# Patient Record
Sex: Female | Born: 1940 | Race: White | Hispanic: No | Marital: Married | Smoking: Never smoker
Health system: Southern US, Community
[De-identification: ages and names within clinical notes are randomized; demographics above are authoritative.]

## PROBLEM LIST (undated history)

## (undated) DIAGNOSIS — M1990 Unspecified osteoarthritis, unspecified site: Secondary | ICD-10-CM

## (undated) DIAGNOSIS — K219 Gastro-esophageal reflux disease without esophagitis: Secondary | ICD-10-CM

## (undated) DIAGNOSIS — Z9221 Personal history of antineoplastic chemotherapy: Secondary | ICD-10-CM

## (undated) DIAGNOSIS — C801 Malignant (primary) neoplasm, unspecified: Secondary | ICD-10-CM

## (undated) DIAGNOSIS — G629 Polyneuropathy, unspecified: Secondary | ICD-10-CM

## (undated) DIAGNOSIS — I10 Essential (primary) hypertension: Secondary | ICD-10-CM

## (undated) DIAGNOSIS — E079 Disorder of thyroid, unspecified: Secondary | ICD-10-CM

## (undated) DIAGNOSIS — Z923 Personal history of irradiation: Secondary | ICD-10-CM

## (undated) DIAGNOSIS — I890 Lymphedema, not elsewhere classified: Secondary | ICD-10-CM

## (undated) DIAGNOSIS — C50919 Malignant neoplasm of unspecified site of unspecified female breast: Secondary | ICD-10-CM

## (undated) HISTORY — PX: APPENDECTOMY: SHX54

## (undated) HISTORY — PX: VENTRAL HERNIA REPAIR: SHX424

## (undated) HISTORY — DX: Essential (primary) hypertension: I10

## (undated) HISTORY — PX: HEMORROIDECTOMY: SUR656

## (undated) HISTORY — DX: Disorder of thyroid, unspecified: E07.9

## (undated) HISTORY — PX: COLONOSCOPY: SHX174

## (undated) HISTORY — PX: FOOT SURGERY: SHX648

---
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
## 1963-12-14 HISTORY — PX: OTHER SURGICAL HISTORY: SHX169

## 1968-12-13 HISTORY — PX: OTHER SURGICAL HISTORY: SHX169

## 1968-12-13 HISTORY — PX: SEPTOPLASTY: SUR1290

## 1972-12-13 HISTORY — PX: VAGINAL HYSTERECTOMY: SUR661

## 1981-12-13 HISTORY — PX: BACK SURGERY: SHX140

## 1998-12-13 DIAGNOSIS — Z923 Personal history of irradiation: Secondary | ICD-10-CM

## 1998-12-13 DIAGNOSIS — C50919 Malignant neoplasm of unspecified site of unspecified female breast: Secondary | ICD-10-CM

## 1998-12-13 DIAGNOSIS — Z9221 Personal history of antineoplastic chemotherapy: Secondary | ICD-10-CM

## 1998-12-13 HISTORY — PX: MASTECTOMY: SHX3

## 1998-12-13 HISTORY — DX: Personal history of irradiation: Z92.3

## 1998-12-13 HISTORY — DX: Malignant neoplasm of unspecified site of unspecified female breast: C50.919

## 1998-12-13 HISTORY — DX: Personal history of antineoplastic chemotherapy: Z92.21

## 2000-03-13 LAB — FECAL OCCULT BLOOD, GUAIAC: Fecal Occult Blood: NEGATIVE

## 2001-02-10 ENCOUNTER — Encounter: Payer: Self-pay | Admitting: Family Medicine

## 2001-02-10 LAB — CONVERTED CEMR LAB: Hgb A1c MFr Bld: 6.3 %

## 2001-05-13 ENCOUNTER — Encounter: Payer: Self-pay | Admitting: Family Medicine

## 2001-05-13 LAB — CONVERTED CEMR LAB: Hgb A1c MFr Bld: 6.4 %

## 2001-06-06 ENCOUNTER — Other Ambulatory Visit: Admission: RE | Admit: 2001-06-06 | Discharge: 2001-06-06 | Payer: Self-pay | Admitting: Family Medicine

## 2001-07-07 ENCOUNTER — Ambulatory Visit (HOSPITAL_COMMUNITY): Admission: RE | Admit: 2001-07-07 | Discharge: 2001-07-07 | Payer: Self-pay | Admitting: Internal Medicine

## 2001-07-07 ENCOUNTER — Encounter: Payer: Self-pay | Admitting: Internal Medicine

## 2001-11-12 ENCOUNTER — Encounter: Payer: Self-pay | Admitting: Family Medicine

## 2001-11-12 LAB — CONVERTED CEMR LAB: Hgb A1c MFr Bld: 6.1 %

## 2002-04-03 HISTORY — PX: OTHER SURGICAL HISTORY: SHX169

## 2002-05-13 ENCOUNTER — Encounter: Payer: Self-pay | Admitting: Family Medicine

## 2002-05-13 LAB — CONVERTED CEMR LAB: Hgb A1c MFr Bld: 6.6 %

## 2002-08-13 ENCOUNTER — Encounter: Payer: Self-pay | Admitting: Family Medicine

## 2003-06-13 ENCOUNTER — Encounter: Payer: Self-pay | Admitting: Family Medicine

## 2003-09-13 ENCOUNTER — Encounter: Payer: Self-pay | Admitting: Family Medicine

## 2004-03-12 HISTORY — PX: OTHER SURGICAL HISTORY: SHX169

## 2004-03-13 ENCOUNTER — Encounter: Payer: Self-pay | Admitting: Family Medicine

## 2004-08-13 ENCOUNTER — Encounter: Payer: Self-pay | Admitting: Family Medicine

## 2004-08-13 LAB — CONVERTED CEMR LAB
Hgb A1c MFr Bld: 7.4 %
Pap Smear: NORMAL

## 2004-08-20 ENCOUNTER — Other Ambulatory Visit: Admission: RE | Admit: 2004-08-20 | Discharge: 2004-08-20 | Payer: Self-pay | Admitting: Family Medicine

## 2004-10-23 ENCOUNTER — Ambulatory Visit: Payer: Self-pay | Admitting: Family Medicine

## 2004-11-12 ENCOUNTER — Encounter: Payer: Self-pay | Admitting: Family Medicine

## 2004-11-17 ENCOUNTER — Ambulatory Visit: Payer: Self-pay | Admitting: Family Medicine

## 2004-11-19 ENCOUNTER — Ambulatory Visit: Payer: Self-pay | Admitting: Family Medicine

## 2005-05-13 ENCOUNTER — Encounter: Payer: Self-pay | Admitting: Family Medicine

## 2005-05-18 ENCOUNTER — Ambulatory Visit: Payer: Self-pay | Admitting: Family Medicine

## 2005-05-27 ENCOUNTER — Ambulatory Visit: Payer: Self-pay | Admitting: Family Medicine

## 2005-08-12 ENCOUNTER — Ambulatory Visit: Payer: Self-pay | Admitting: Internal Medicine

## 2005-08-12 HISTORY — PX: OTHER SURGICAL HISTORY: SHX169

## 2005-09-30 ENCOUNTER — Ambulatory Visit: Payer: Self-pay | Admitting: Family Medicine

## 2005-10-29 ENCOUNTER — Ambulatory Visit: Payer: Self-pay | Admitting: Family Medicine

## 2005-11-12 ENCOUNTER — Encounter: Payer: Self-pay | Admitting: Family Medicine

## 2005-11-12 LAB — CONVERTED CEMR LAB: Hgb A1c MFr Bld: 6.3 %

## 2005-11-23 ENCOUNTER — Ambulatory Visit: Payer: Self-pay | Admitting: Family Medicine

## 2005-11-23 LAB — CONVERTED CEMR LAB: Microalbumin U total vol: 3.7 mg/L

## 2005-11-25 ENCOUNTER — Ambulatory Visit: Payer: Self-pay | Admitting: Family Medicine

## 2005-12-02 ENCOUNTER — Encounter: Payer: Self-pay | Admitting: Family Medicine

## 2005-12-13 ENCOUNTER — Encounter: Payer: Self-pay | Admitting: Family Medicine

## 2006-01-13 ENCOUNTER — Encounter: Payer: Self-pay | Admitting: Family Medicine

## 2006-01-27 ENCOUNTER — Ambulatory Visit: Payer: Self-pay | Admitting: Family Medicine

## 2006-02-11 ENCOUNTER — Ambulatory Visit: Payer: Self-pay | Admitting: Family Medicine

## 2006-02-24 ENCOUNTER — Ambulatory Visit: Payer: Self-pay

## 2006-02-24 ENCOUNTER — Encounter: Payer: Self-pay | Admitting: Cardiology

## 2006-02-24 HISTORY — PX: OTHER SURGICAL HISTORY: SHX169

## 2006-02-24 HISTORY — PX: DOPPLER ECHOCARDIOGRAPHY: SHX263

## 2006-05-13 ENCOUNTER — Encounter: Payer: Self-pay | Admitting: Family Medicine

## 2006-05-13 LAB — CONVERTED CEMR LAB: Hgb A1c MFr Bld: 7.2 %

## 2006-05-24 ENCOUNTER — Ambulatory Visit: Payer: Self-pay | Admitting: Family Medicine

## 2006-05-26 ENCOUNTER — Ambulatory Visit: Payer: Self-pay | Admitting: Family Medicine

## 2006-06-12 ENCOUNTER — Encounter: Payer: Self-pay | Admitting: Family Medicine

## 2006-06-12 LAB — CONVERTED CEMR LAB: Hgb A1c MFr Bld: 6.7 %

## 2006-06-27 ENCOUNTER — Ambulatory Visit: Payer: Self-pay | Admitting: Family Medicine

## 2006-10-31 ENCOUNTER — Ambulatory Visit: Payer: Self-pay | Admitting: Family Medicine

## 2006-11-28 ENCOUNTER — Ambulatory Visit: Payer: Self-pay | Admitting: Family Medicine

## 2007-01-02 ENCOUNTER — Ambulatory Visit: Payer: Self-pay | Admitting: Family Medicine

## 2007-03-30 ENCOUNTER — Ambulatory Visit: Payer: Self-pay | Admitting: Family Medicine

## 2007-03-30 LAB — CONVERTED CEMR LAB
Hgb A1c MFr Bld: 7.9 %
Hgb A1c MFr Bld: 7.9 % — ABNORMAL HIGH (ref 4.6–6.0)

## 2007-04-03 ENCOUNTER — Ambulatory Visit: Payer: Self-pay | Admitting: Family Medicine

## 2007-04-26 ENCOUNTER — Encounter: Payer: Self-pay | Admitting: Family Medicine

## 2007-04-26 DIAGNOSIS — C50919 Malignant neoplasm of unspecified site of unspecified female breast: Secondary | ICD-10-CM | POA: Insufficient documentation

## 2007-04-26 DIAGNOSIS — E118 Type 2 diabetes mellitus with unspecified complications: Secondary | ICD-10-CM | POA: Insufficient documentation

## 2007-04-26 DIAGNOSIS — I10 Essential (primary) hypertension: Secondary | ICD-10-CM

## 2007-04-26 DIAGNOSIS — E119 Type 2 diabetes mellitus without complications: Secondary | ICD-10-CM | POA: Insufficient documentation

## 2007-04-26 DIAGNOSIS — E039 Hypothyroidism, unspecified: Secondary | ICD-10-CM | POA: Insufficient documentation

## 2007-05-01 ENCOUNTER — Ambulatory Visit: Payer: Self-pay | Admitting: Family Medicine

## 2007-07-26 ENCOUNTER — Ambulatory Visit: Payer: Self-pay | Admitting: Family Medicine

## 2007-07-30 LAB — CONVERTED CEMR LAB
Basophils Absolute: 0.1 10*3/uL (ref 0.0–0.1)
CO2: 31 meq/L (ref 19–32)
Chloride: 102 meq/L (ref 96–112)
Cholesterol: 161 mg/dL (ref 0–200)
Eosinophils Absolute: 0.2 10*3/uL (ref 0.0–0.6)
Eosinophils Relative: 3.6 % (ref 0.0–5.0)
Free T4: 0.7 ng/dL (ref 0.6–1.6)
GFR calc Af Amer: 107 mL/min
Glucose, Bld: 91 mg/dL (ref 70–99)
HCT: 35.9 % — ABNORMAL LOW (ref 36.0–46.0)
Hemoglobin: 12.4 g/dL (ref 12.0–15.0)
MCHC: 34.4 g/dL (ref 30.0–36.0)
MCV: 84.9 fL (ref 78.0–100.0)
Microalb Creat Ratio: 3.2 mg/g (ref 0.0–30.0)
Monocytes Absolute: 0.6 10*3/uL (ref 0.2–0.7)
Neutrophils Relative %: 59.4 % (ref 43.0–77.0)
Sodium: 141 meq/L (ref 135–145)
TSH: 3.26 microintl units/mL (ref 0.35–5.50)

## 2007-08-01 ENCOUNTER — Ambulatory Visit: Payer: Self-pay | Admitting: Family Medicine

## 2007-09-26 ENCOUNTER — Ambulatory Visit: Payer: Self-pay | Admitting: Oncology

## 2007-10-10 ENCOUNTER — Encounter: Payer: Self-pay | Admitting: Family Medicine

## 2007-10-10 LAB — CBC WITH DIFFERENTIAL (CANCER CENTER ONLY)
BASO%: 0.6 % (ref 0.0–2.0)
EOS%: 2.8 % (ref 0.0–7.0)
LYMPH%: 26.2 % (ref 14.0–48.0)
MCH: 28.1 pg (ref 26.0–34.0)
MCV: 84 fL (ref 81–101)
MONO%: 6.1 % (ref 0.0–13.0)
NEUT#: 5.5 10*3/uL (ref 1.5–6.5)
Platelets: 377 10*3/uL (ref 145–400)
RDW: 12.6 % (ref 10.5–14.6)

## 2007-10-10 LAB — COMPREHENSIVE METABOLIC PANEL
ALT: 23 U/L (ref 0–35)
AST: 27 U/L (ref 0–37)
CO2: 27 mEq/L (ref 19–32)
Calcium: 9.5 mg/dL (ref 8.4–10.5)
Chloride: 97 mEq/L (ref 96–112)
Creatinine, Ser: 0.76 mg/dL (ref 0.40–1.20)
Potassium: 4 mEq/L (ref 3.5–5.3)
Sodium: 136 mEq/L (ref 135–145)
Total Protein: 6.6 g/dL (ref 6.0–8.3)

## 2007-10-12 ENCOUNTER — Encounter: Admission: RE | Admit: 2007-10-12 | Discharge: 2007-10-12 | Payer: Self-pay | Admitting: Oncology

## 2007-10-13 ENCOUNTER — Ambulatory Visit: Payer: Self-pay | Admitting: Family Medicine

## 2007-10-25 ENCOUNTER — Ambulatory Visit: Payer: Self-pay | Admitting: Internal Medicine

## 2007-10-25 ENCOUNTER — Encounter: Payer: Self-pay | Admitting: Family Medicine

## 2007-10-25 HISTORY — PX: OTHER SURGICAL HISTORY: SHX169

## 2007-11-27 ENCOUNTER — Telehealth: Payer: Self-pay | Admitting: Family Medicine

## 2007-11-28 ENCOUNTER — Ambulatory Visit: Payer: Self-pay | Admitting: Family Medicine

## 2008-01-25 ENCOUNTER — Ambulatory Visit: Payer: Self-pay | Admitting: Family Medicine

## 2008-01-25 LAB — CONVERTED CEMR LAB: Hgb A1c MFr Bld: 7.7 % — ABNORMAL HIGH (ref 4.6–6.0)

## 2008-01-29 ENCOUNTER — Ambulatory Visit: Payer: Self-pay | Admitting: Family Medicine

## 2008-07-30 ENCOUNTER — Ambulatory Visit: Payer: Self-pay | Admitting: Family Medicine

## 2008-07-30 LAB — CONVERTED CEMR LAB
Albumin: 3.5 g/dL (ref 3.5–5.2)
BUN: 12 mg/dL (ref 6–23)
Basophils Relative: 0.6 % (ref 0.0–3.0)
Calcium: 9.3 mg/dL (ref 8.4–10.5)
Creatinine, Ser: 0.7 mg/dL (ref 0.4–1.2)
Creatinine,U: 41.2 mg/dL
Eosinophils Absolute: 0.3 10*3/uL (ref 0.0–0.7)
Eosinophils Relative: 3.9 % (ref 0.0–5.0)
GFR calc Af Amer: 107 mL/min
GFR calc non Af Amer: 88 mL/min
Glucose, Bld: 114 mg/dL — ABNORMAL HIGH (ref 70–99)
HCT: 38.3 % (ref 36.0–46.0)
HDL: 32.8 mg/dL — ABNORMAL LOW (ref >39.0)
Hemoglobin: 13 g/dL (ref 12.0–15.0)
Hgb A1c MFr Bld: 7.9 % — ABNORMAL HIGH (ref 4.6–6.0)
MCV: 85.1 fL (ref 78.0–100.0)
Microalb Creat Ratio: 4.9 mg/g (ref 0.0–30.0)
Monocytes Absolute: 0.6 10*3/uL (ref 0.1–1.0)
Monocytes Relative: 8 % (ref 3.0–12.0)
Neutro Abs: 4.6 10*3/uL (ref 1.4–7.7)
Platelets: 370 10*3/uL (ref 150–400)
RBC: 4.5 M/uL (ref 3.87–5.11)
Total CHOL/HDL Ratio: 5.1
Total Protein: 6.6 g/dL (ref 6.0–8.3)
WBC: 7.4 10*3/uL (ref 4.5–10.5)

## 2008-08-01 ENCOUNTER — Ambulatory Visit: Payer: Self-pay | Admitting: Family Medicine

## 2008-09-17 ENCOUNTER — Ambulatory Visit: Payer: Self-pay | Admitting: Family Medicine

## 2008-10-08 ENCOUNTER — Ambulatory Visit: Payer: Self-pay | Admitting: Oncology

## 2008-10-14 ENCOUNTER — Encounter: Admission: RE | Admit: 2008-10-14 | Discharge: 2008-10-14 | Payer: Self-pay | Admitting: Family Medicine

## 2008-10-17 ENCOUNTER — Encounter (INDEPENDENT_AMBULATORY_CARE_PROVIDER_SITE_OTHER): Payer: Self-pay | Admitting: *Deleted

## 2008-11-28 ENCOUNTER — Ambulatory Visit: Payer: Self-pay | Admitting: Oncology

## 2008-12-02 ENCOUNTER — Encounter: Payer: Self-pay | Admitting: Family Medicine

## 2008-12-02 LAB — CMP (CANCER CENTER ONLY)
ALT(SGPT): 28 U/L (ref 10–47)
AST: 35 U/L (ref 11–38)
Alkaline Phosphatase: 41 U/L (ref 26–84)
CO2: 27 mEq/L (ref 18–33)
Creat: 0.8 mg/dl (ref 0.6–1.2)
Sodium: 134 mEq/L (ref 128–145)
Total Bilirubin: 0.6 mg/dl (ref 0.20–1.60)
Total Protein: 7.3 g/dL (ref 6.4–8.1)

## 2008-12-02 LAB — CBC WITH DIFFERENTIAL (CANCER CENTER ONLY)
BASO#: 0.1 10*3/uL (ref 0.0–0.2)
EOS%: 2.6 % (ref 0.0–7.0)
Eosinophils Absolute: 0.2 10*3/uL (ref 0.0–0.5)
HGB: 13.1 g/dL (ref 11.6–15.9)
LYMPH%: 18.9 % (ref 14.0–48.0)
MCH: 27.5 pg (ref 26.0–34.0)
MCHC: 33.1 g/dL (ref 32.0–36.0)
MCV: 83 fL (ref 81–101)
MONO%: 6.7 % (ref 0.0–13.0)
Platelets: 330 10*3/uL (ref 145–400)
RBC: 4.77 10*6/uL (ref 3.70–5.32)

## 2008-12-27 ENCOUNTER — Ambulatory Visit: Payer: Self-pay | Admitting: Family Medicine

## 2009-01-21 ENCOUNTER — Ambulatory Visit: Payer: Self-pay | Admitting: Family Medicine

## 2009-01-22 LAB — CONVERTED CEMR LAB: Vit D, 1,25-Dihydroxy: 42 (ref 30–89)

## 2009-01-23 ENCOUNTER — Ambulatory Visit: Payer: Self-pay | Admitting: Family Medicine

## 2009-01-23 DIAGNOSIS — M25519 Pain in unspecified shoulder: Secondary | ICD-10-CM

## 2009-01-27 ENCOUNTER — Ambulatory Visit: Payer: Self-pay | Admitting: Family Medicine

## 2009-04-29 ENCOUNTER — Ambulatory Visit: Payer: Self-pay | Admitting: Family Medicine

## 2009-04-29 DIAGNOSIS — T7840XA Allergy, unspecified, initial encounter: Secondary | ICD-10-CM | POA: Insufficient documentation

## 2009-05-05 ENCOUNTER — Ambulatory Visit: Payer: Self-pay | Admitting: Family Medicine

## 2009-05-19 ENCOUNTER — Telehealth (INDEPENDENT_AMBULATORY_CARE_PROVIDER_SITE_OTHER): Payer: Self-pay | Admitting: *Deleted

## 2009-07-31 ENCOUNTER — Ambulatory Visit: Payer: Self-pay | Admitting: Family Medicine

## 2009-07-31 LAB — CONVERTED CEMR LAB
ALT: 20 units/L (ref 0–35)
AST: 26 units/L (ref 0–37)
Alkaline Phosphatase: 45 units/L (ref 39–117)
BUN: 14 mg/dL (ref 6–23)
Basophils Absolute: 0 10*3/uL (ref 0.0–0.1)
Basophils Relative: 0.3 % (ref 0.0–3.0)
Bilirubin, Direct: 0 mg/dL (ref 0.0–0.3)
Calcium: 9.4 mg/dL (ref 8.4–10.5)
Cholesterol: 176 mg/dL (ref 0–200)
Eosinophils Absolute: 0.2 10*3/uL (ref 0.0–0.7)
Free T4: 0.9 ng/dL (ref 0.6–1.6)
GFR calc non Af Amer: 87.87 mL/min (ref >60)
Glucose, Bld: 112 mg/dL — ABNORMAL HIGH (ref 70–99)
Hgb A1c MFr Bld: 7.9 % — ABNORMAL HIGH (ref 4.6–6.5)
Lymphocytes Relative: 20.9 % (ref 12.0–46.0)
MCHC: 33.9 g/dL (ref 30.0–36.0)
MCV: 85.9 fL (ref 78.0–100.0)
Microalb, Ur: 0.4 mg/dL (ref 0.0–1.9)
Monocytes Absolute: 0.7 10*3/uL (ref 0.1–1.0)
Neutrophils Relative %: 67.8 % (ref 43.0–77.0)
Platelets: 316 10*3/uL (ref 150.0–400.0)
Potassium: 3.9 meq/L (ref 3.5–5.1)
RDW: 13 % (ref 11.5–14.6)
Sodium: 136 meq/L (ref 135–145)
Total Bilirubin: 0.9 mg/dL (ref 0.3–1.2)
Total Protein: 7 g/dL (ref 6.0–8.3)

## 2009-08-06 ENCOUNTER — Encounter: Payer: Self-pay | Admitting: Family Medicine

## 2009-08-06 ENCOUNTER — Other Ambulatory Visit: Admission: RE | Admit: 2009-08-06 | Discharge: 2009-08-06 | Payer: Self-pay | Admitting: Family Medicine

## 2009-08-06 ENCOUNTER — Ambulatory Visit: Payer: Self-pay | Admitting: Family Medicine

## 2009-08-06 LAB — CONVERTED CEMR LAB: Pap Smear: NORMAL

## 2009-08-12 ENCOUNTER — Encounter (INDEPENDENT_AMBULATORY_CARE_PROVIDER_SITE_OTHER): Payer: Self-pay | Admitting: *Deleted

## 2009-10-13 ENCOUNTER — Ambulatory Visit: Payer: Self-pay | Admitting: Family Medicine

## 2009-10-24 ENCOUNTER — Telehealth: Payer: Self-pay | Admitting: Family Medicine

## 2009-11-03 ENCOUNTER — Encounter: Payer: Self-pay | Admitting: Family Medicine

## 2009-11-03 ENCOUNTER — Ambulatory Visit: Payer: Self-pay | Admitting: Family Medicine

## 2009-11-03 LAB — HM MAMMOGRAPHY: HM Mammogram: NORMAL

## 2009-11-10 ENCOUNTER — Encounter (INDEPENDENT_AMBULATORY_CARE_PROVIDER_SITE_OTHER): Payer: Self-pay | Admitting: *Deleted

## 2009-11-12 ENCOUNTER — Telehealth: Payer: Self-pay | Admitting: Family Medicine

## 2009-11-13 ENCOUNTER — Encounter: Payer: Self-pay | Admitting: Family Medicine

## 2009-12-13 ENCOUNTER — Ambulatory Visit: Payer: Self-pay | Admitting: Internal Medicine

## 2010-01-01 ENCOUNTER — Ambulatory Visit: Payer: Self-pay | Admitting: Internal Medicine

## 2010-01-01 ENCOUNTER — Encounter: Payer: Self-pay | Admitting: Family Medicine

## 2010-01-13 ENCOUNTER — Ambulatory Visit: Payer: Self-pay | Admitting: Internal Medicine

## 2010-01-21 ENCOUNTER — Encounter: Payer: Self-pay | Admitting: Family Medicine

## 2010-01-21 ENCOUNTER — Ambulatory Visit: Payer: Self-pay | Admitting: Internal Medicine

## 2010-01-21 LAB — HM DEXA SCAN

## 2010-02-05 ENCOUNTER — Ambulatory Visit: Payer: Self-pay | Admitting: Family Medicine

## 2010-02-10 ENCOUNTER — Ambulatory Visit: Payer: Self-pay | Admitting: Family Medicine

## 2010-02-10 DIAGNOSIS — K449 Diaphragmatic hernia without obstruction or gangrene: Secondary | ICD-10-CM | POA: Insufficient documentation

## 2010-02-10 LAB — HM DIABETES FOOT EXAM

## 2010-03-13 ENCOUNTER — Encounter: Payer: Self-pay | Admitting: Family Medicine

## 2010-03-25 ENCOUNTER — Encounter: Payer: Self-pay | Admitting: Family Medicine

## 2010-04-22 ENCOUNTER — Encounter: Payer: Self-pay | Admitting: Family Medicine

## 2010-04-22 ENCOUNTER — Ambulatory Visit: Payer: Self-pay | Admitting: Unknown Physician Specialty

## 2010-04-22 LAB — HM COLONOSCOPY

## 2010-06-09 ENCOUNTER — Telehealth: Payer: Self-pay | Admitting: Family Medicine

## 2010-06-26 ENCOUNTER — Encounter: Payer: Self-pay | Admitting: Family Medicine

## 2010-08-05 ENCOUNTER — Telehealth (INDEPENDENT_AMBULATORY_CARE_PROVIDER_SITE_OTHER): Payer: Self-pay | Admitting: *Deleted

## 2010-08-06 ENCOUNTER — Ambulatory Visit: Payer: Self-pay | Admitting: Family Medicine

## 2010-08-07 LAB — CONVERTED CEMR LAB
ALT: 22 units/L (ref 0–35)
AST: 25 units/L (ref 0–37)
Albumin: 3.5 g/dL (ref 3.5–5.2)
Alkaline Phosphatase: 46 units/L (ref 39–117)
Chloride: 99 meq/L (ref 96–112)
GFR calc non Af Amer: 89.08 mL/min (ref >60)
Glucose, Bld: 139 mg/dL — ABNORMAL HIGH (ref 70–99)
Hgb A1c MFr Bld: 8 % — ABNORMAL HIGH (ref 4.6–6.5)
LDL Cholesterol: 105 mg/dL — ABNORMAL HIGH (ref 0–99)
Potassium: 4.4 meq/L (ref 3.5–5.1)
Sodium: 136 meq/L (ref 135–145)
VLDL: 18.4 mg/dL (ref 0.0–40.0)

## 2010-08-10 ENCOUNTER — Ambulatory Visit: Payer: Self-pay | Admitting: Family Medicine

## 2010-10-27 DIAGNOSIS — C4491 Basal cell carcinoma of skin, unspecified: Secondary | ICD-10-CM

## 2010-10-27 HISTORY — DX: Basal cell carcinoma of skin, unspecified: C44.91

## 2010-11-03 ENCOUNTER — Ambulatory Visit: Payer: Self-pay | Admitting: Family Medicine

## 2010-11-16 ENCOUNTER — Ambulatory Visit: Payer: Self-pay | Admitting: Internal Medicine

## 2010-11-19 ENCOUNTER — Ambulatory Visit: Payer: Self-pay | Admitting: Internal Medicine

## 2010-11-24 ENCOUNTER — Ambulatory Visit: Payer: Self-pay | Admitting: Family Medicine

## 2010-11-24 DIAGNOSIS — M79609 Pain in unspecified limb: Secondary | ICD-10-CM

## 2010-12-04 ENCOUNTER — Ambulatory Visit: Payer: Self-pay | Admitting: Surgery

## 2010-12-11 ENCOUNTER — Ambulatory Visit: Payer: Self-pay | Admitting: Surgery

## 2010-12-11 HISTORY — PX: BREAST EXCISIONAL BIOPSY: SUR124

## 2010-12-15 LAB — PATHOLOGY REPORT

## 2010-12-17 ENCOUNTER — Encounter (INDEPENDENT_AMBULATORY_CARE_PROVIDER_SITE_OTHER): Payer: Self-pay | Admitting: *Deleted

## 2011-01-05 ENCOUNTER — Ambulatory Visit: Payer: Self-pay | Admitting: Internal Medicine

## 2011-01-05 ENCOUNTER — Encounter: Payer: Self-pay | Admitting: Family Medicine

## 2011-01-06 LAB — CANCER ANTIGEN 27.29: CA 27.29: 33.6 U/mL (ref 0.0–38.6)

## 2011-01-10 LAB — CONVERTED CEMR LAB
Eosinophils Absolute: 0.3 10*3/uL (ref 0.0–0.7)
Eosinophils Relative: 3.7 % (ref 0.0–5.0)
HCT: 39.1 % (ref 36.0–46.0)
Hemoglobin: 13 g/dL (ref 12.0–15.0)
MCV: 86.2 fL (ref 78.0–100.0)
Monocytes Absolute: 0.6 10*3/uL (ref 0.1–1.0)
Monocytes Relative: 7.4 % (ref 3.0–12.0)
Neutro Abs: 5.2 10*3/uL (ref 1.4–7.7)
Platelets: 359 10*3/uL (ref 150–400)
RDW: 13.1 % (ref 11.5–14.6)

## 2011-01-13 ENCOUNTER — Ambulatory Visit: Payer: Self-pay | Admitting: Internal Medicine

## 2011-01-14 ENCOUNTER — Encounter: Payer: Self-pay | Admitting: Family Medicine

## 2011-01-14 NOTE — Letter (Signed)
Summary: Dr.Ybanez,[facility],Note   Imported By: Beau Fanny 03/24/2010 09:57:34  _____________________________________________________________________  External Attachment:    Type:   Image     Comment:   External Document

## 2011-01-14 NOTE — Progress Notes (Signed)
Summary: Lantus Solostar       New/Updated Medications: LANTUS SOLOSTAR 100 UNIT/ML SOLN (INSULIN GLARGINE) 36 units per day or as directed. Prescriptions: LANTUS SOLOSTAR 100 UNIT/ML SOLN (INSULIN GLARGINE) 36 units per day or as directed.  #5 x 12   Entered by:   Delilah Shan CMA (AAMA)   Authorized by:   Shaune Leeks MD   Signed by:   Delilah Shan CMA (AAMA) on 06/09/2010   Method used:   Electronically to        [facility] (retail)       [address]       [place], [place]  [id number]       Ph: [phone]       Fax: [phone]   RxID:   [id number]

## 2011-01-14 NOTE — Procedures (Signed)
Summary: Colonoscopy by Dr.Marli Diego [facility]  Colonoscopy by Dr.Marcanthony [facility]   Imported By: Beau Fanny 04/27/2010 16:34:03  _____________________________________________________________________  External Attachment:    Type:   Image     Comment:   External Document

## 2011-01-14 NOTE — Assessment & Plan Note (Signed)
Summary: F/U AFTER LABS / LFW   Vital Signs:  Patient profile:   70 year old female Weight:      212.75 pounds Temp:     98.4 degrees F oral Pulse rate:   88 / minute Pulse rhythm:   regular BP sitting:   126 / 70  (left arm) Cuff size:   large  Vitals Entered By: Sydell Axon LPN (February 10, 1609 9:57 AM) CC: 6 Month follow-up after labs   History of Present Illness: Pt here for followup. She is doing well with no real complaints.  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 Take 2 Tablet By Mouth Twice A Day 2)  Zetia 10 Mg Tabs (Ezetimibe) .... Take 1 Tablet By Mouth Once A Day 3)  Vitamin C 500 Mg Tabs (Ascorbic Acid) .... Take One By Mouth Twice A Day 4)  Tylenol Pm Extra Strength 500-25 Mg Tabs (Diphenhydramine-Apap (Sleep)) .... Take 1/2 By Mouth At Bedtime By Mouth As Needed 5)  Glucophage 500 Mg Tabs (Metformin Hcl) .... Take One By Mouth Mid-Day Daily 6)  Aspirin 81 Mg Tabs (Aspirin) .... Take One By Mouth Daily 7)  Citracal With Mag. 8)  Dramamine 50 Mg Tabs (Dimenhydrinate) .Marland Kitchen.. 1 By Mouth As Needed 9)  Mucinex 600 Mg Tb12 (Guaifenesin) .... As Needed 10)  Lantus Solostar 100 Unit/ml Soln (Insulin Glargine) .... 26 Units Betwwen 6&7pmqd 11)  Centrum Silver   Tabs (Multiple Vitamins-Minerals) .Marland Kitchen.. 1 Daily 12)  Diovan Hct 160-25 Mg Tabs (Valsartan-Hydrochlorothiazide) .Marland Kitchen.. 1 Daily By Mouth 13)  Robitussin Dm Sugar Free 100-10 Mg/28ml Syrp (Dextromethorphan-Guaifenesin) .... As Needed 14)  Accu-Chek  Aviva  Strp (Glucose Blood) .... Use As Directed Twice Daily  Icd.9 Code 250.00 15)  Accu-Chek Aviva  Kit (Blood Glucose Monitoring Suppl) .... Lancets  Use Twice Daily As Directed Icd.9 Code 250.00 16)  Clobetasol Propionate 0.05 % Crea (Clobetasol Propionate) .... Apply To Area of Involvement Two Times A Day  Allergies: 1)  ! * Avandia 2)  ! * Hycodan 3)  Avandia (Rosiglitazone Maleate)  Past History:  Past Surgical History: NSVD x 5 H/o Mumps 1965 Misscarriage 1970 Septoplasty 1970 Appendectomy Hysterectomy, partiial., fibroids 1974 Tonsillectomy 1945 Foot surgery, bilat. 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 Head:  Normocephalic and atraumatic without obvious abnormalities. Wears wig due to past chemo. Eyes:  Conjunctiva clear bilaterally.  Ears:  External ear exam shows no significant lesions or deformities.  Otoscopic examination reveals clear canals, tympanic membranes are intact bilaterally without bulging, retraction, inflammation or discharge. Hearing is grossly normal bilaterally. Nose:  External nasal examination shows no deformity or inflammation. Nasal mucosa are pink and  moist without lesions or exudates. Mouth:  Oral mucosa and oropharynx without lesions or exudates.  Teeth in good repair. Neck:  No deformities, masses, or tenderness noted. Chest Wall:  No deformities, masses, or tenderness noted. Lungs:  Normal respiratory effort, chest expands symmetrically. Lungs are clear to auscultation, no crackles  or wheezes. Heart:  Normal rate and regular rhythm. S1 and S2 normal without gallop, murmur, click, rub or other extra sounds. Abdomen:  Bowel sounds positive,abdomen soft and non-tender without masses, organomegaly or hernias noted. Psych:  Cognition and judgment appear intact. Alert and cooperative with normal attention span and concentration. No apparent delusions, illusions, hallucinations  Diabetes Management Exam:    Foot Exam (with socks and/or shoes not present):       Sensory-Pinprick/Light touch:          Left medial foot (L-4): normal          Left dorsal foot (L-5): normal          Left lateral foot (S-1): normal          Right medial foot (L-4): normal          Right dorsal foot (L-5): normal          Right lateral foot (S-1): normal       Sensory-Monofilament:          Left foot: normal          Right foot: normal       Inspection:          Left foot: normal          Right foot: normal       Nails:          Left foot: normal          Right foot: normal   Impression & Recommendations:  Problem # 1:  COLONIC POLYP, BENIGN, H/O IN HER 30S (ICD-V12.72) Assessment Unchanged Refer to GI for presumed Colonoscopy Orders: Gastroenterology Referral (GI)  Problem # 2:  HIATAL HERNIA (ICD-553.3) Assessment: Unchanged Refer to GI for poss EGD. Orders: Gastroenterology Referral (GI)  Problem # 3:  OSTEOPOROSIS VIA DEXA (ICD-733.00) Assessment: Unchanged  Via Dexa, Nml  with mild bone loss not yet Osteopenia of the Femoral Neck. Cont Ca and Vit D.  Vit D:41 (07/31/2009), 42 (01/21/2009)  Problem # 4:  HYPERTENSION (ICD-401.9) Assessment: Unchanged Stable. Her updated medication list for this problem includes:    Diovan Hct 160-25 Mg Tabs (Valsartan-hydrochlorothiazide) .Marland Kitchen... 1 daily by mouth  Orders: Gastroenterology Referral (GI)  BP today: 126/70 Prior BP: 130/62 (10/13/2009)  Labs Reviewed: K+: 3.9 (07/31/2009) Creat: : 0.7 (07/31/2009)   Chol: 176  (07/31/2009)   HDL: 38.60 (07/31/2009)   LDL: 110 (07/31/2009)   TG: 136.0 (07/31/2009)  Problem # 5:  DIABETES MELLITUS, TYPE II (ICD-250.00) Really needs better control but pt declines at present.  Her updated medication list for this problem includes:    Glucovance 5-500 Mg Tabs (Glyburide-metformin) .Marland Kitchen... Take 2 tablet by mouth twice a day    Glucophage 500 Mg Tabs (Metformin hcl) .Marland Kitchen... Take one by mouth mid-day daily    Aspirin 81 Mg Tabs (Aspirin) .Marland Kitchen... Take one by mouth daily    Lantus Solostar 100 Unit/ml Soln (Insulin glargine) .Marland Kitchen... 26 units betwwen 6&7pmqd    Diovan Hct 160-25 Mg Tabs (Valsartan-hydrochlorothiazide) .Marland Kitchen... 1 daily by mouth  Orders: Gastroenterology Referral (GI)  Labs Reviewed: Creat: 0.7 (07/31/2009)   Microalbumin: 3.7 (11/23/2005)  Last Eye Exam: normal (02/26/2009)  Reviewed HgBA1c results: 7.6 (02/05/2010)  7.9 (07/31/2009)  Complete Medication List: 1)  Glucovance 5-500 Mg Tabs (Glyburide-metformin) .... Take 2 tablet by mouth twice a day 2)  Zetia 10 Mg Tabs (Ezetimibe) .... Take 1 tablet by mouth once a day 3)  Vitamin C 500 Mg Tabs (Ascorbic acid) .... Take one by mouth twice a day 4)  Tylenol Pm Extra Strength 500-25 Mg Tabs (Diphenhydramine-apap (sleep)) .... Take 1/2 by mouth at bedtime by mouth as needed 5)  Glucophage 500 Mg Tabs (Metformin hcl) .... Take one by mouth mid-day daily 6)  Aspirin 81 Mg Tabs (Aspirin) .... Take one by mouth daily 7)  Citracal With Mag.  8)  Dramamine 50 Mg Tabs (Dimenhydrinate) .Marland Kitchen.. 1 by mouth as needed 9)  Mucinex 600 Mg Tb12 (Guaifenesin) .... As needed 10)  Lantus Solostar 100 Unit/ml Soln (Insulin glargine) .... 26 units betwwen 6&7pmqd 11)  Centrum Silver Tabs (Multiple vitamins-minerals) .Marland Kitchen.. 1 daily 12)  Diovan Hct 160-25 Mg Tabs (Valsartan-hydrochlorothiazide) .Marland Kitchen.. 1 daily by mouth 13)  Robitussin Dm Sugar Free 100-10 Mg/65ml Syrp (Dextromethorphan-guaifenesin) .... As needed 14)  Accu-chek Aviva Strp  (Glucose blood) .... Use as directed twice daily  icd.9 code 250.00 15)  Accu-chek Aviva Kit (Blood glucose monitoring suppl) .... Lancets  use twice daily as directed icd.9 code 250.00  Patient Instructions: 1)  Get shingles shot.  2)  Refer for colonoscopy, [place]. 3)  Pt interested in establishing with Dr Terance Hart at [facility].  Current Allergies (reviewed today): ! * AVANDIA ! * HYCODAN AVANDIA (ROSIGLITAZONE MALEATE)

## 2011-01-14 NOTE — Miscellaneous (Signed)
Summary: med list update  Clinical Lists Changes  Medications: Added new medication of ACCU-CHEK COMPACT  STRP (GLUCOSE BLOOD) use as directed Removed medication of ACCU-CHEK AVIVA  STRP (GLUCOSE BLOOD) use as directed twice daily  icd.9 code 250.00     Prior Medications: GLUCOVANCE 5-500 MG TABS (GLYBURIDE-METFORMIN) Take 2 tablet by mouth twice a day ZETIA 10 MG TABS (EZETIMIBE) Take 1 tablet by mouth once a day VITAMIN C 500 MG TABS (ASCORBIC ACID) Take one by mouth twice a day TYLENOL PM EXTRA STRENGTH 500-25 MG TABS (DIPHENHYDRAMINE-APAP (SLEEP)) Take 1/2 by mouth at bedtime by mouth as needed GLUCOPHAGE 500 MG TABS (METFORMIN HCL) Take one by mouth mid-day daily ASPIRIN 81 MG TABS (ASPIRIN) Take one by mouth daily CITRACAL WITH MAG. ()  DRAMAMINE 50 MG TABS (DIMENHYDRINATE) 1 by mouth as needed MUCINEX 600 MG TB12 (GUAIFENESIN) as needed CENTRUM SILVER   TABS (MULTIPLE VITAMINS-MINERALS) 1 daily DIOVAN HCT 160-25 MG TABS (VALSARTAN-HYDROCHLOROTHIAZIDE) 1 DAILY BY MOUTH ROBITUSSIN DM SUGAR FREE 100-10 MG/5ML SYRP (DEXTROMETHORPHAN-GUAIFENESIN) as needed ACCU-CHEK AVIVA  KIT (BLOOD GLUCOSE MONITORING SUPPL) lancets  use twice daily as directed icd.9 code 250.00 LANTUS SOLOSTAR 100 UNIT/ML SOLN (INSULIN GLARGINE) inject 38 units and as needed on a sliding scale BD PEN NEEDLE MINI U/F 31G X 5 MM MISC (INSULIN PEN NEEDLE) Use as directed ACCU-CHEK MULTICLIX LANCETS  MISC (LANCETS) use as directed ACCU-CHEK COMPACT  STRP (GLUCOSE BLOOD) use as directed Current Allergies: ! * AVANDIA ! * HYCODAN AVANDIA (ROSIGLITAZONE MALEATE)

## 2011-01-14 NOTE — Progress Notes (Signed)
----   Converted from flag ---- ---- 08/05/2010 1:58 PM, Ruthe Mannan MD wrote: please also add a1c (250.00) ------------------------------

## 2011-01-14 NOTE — Miscellaneous (Signed)
Summary: BD needles  Clinical Lists Changes  Medications: Added new medication of BD PEN NEEDLE MINI U/F 31G X 5 MM MISC (INSULIN PEN NEEDLE) Use as directed - Signed Rx of BD PEN NEEDLE MINI U/F 31G X 5 MM MISC (INSULIN PEN NEEDLE) Use as directed;  #100 x prn;  Signed;  Entered by: Lowella Petties CMA;  Authorized by: Ruthe Mannan MD;  Method used: Electronically to [facility] [address], [place], [place], [place]  [id number], Ph: [phone], Fax: [phone]    Prescriptions: BD PEN NEEDLE MINI U/F 31G X 5 MM MISC (INSULIN PEN NEEDLE) Use as directed  #100 x prn   Entered by:   Lowella Petties CMA   Authorized by:   Ruthe Mannan MD   Signed by:   Lowella Petties CMA on 06/26/2010   Method used:   Electronically to        [facility] (retail)       [address]       [place], [place]  [id number]       Ph: [phone]       Fax: [phone]   RxID:   [id number]   Prior Medications: GLUCOVANCE 5-500 MG TABS (GLYBURIDE-METFORMIN) Take 2 tablet by mouth twice a day ZETIA 10 MG TABS (EZETIMIBE) Take 1 tablet by mouth once a day VITAMIN C 500 MG TABS (ASCORBIC ACID) Take one by mouth twice a day TYLENOL PM EXTRA STRENGTH 500-25 MG TABS (DIPHENHYDRAMINE-APAP (SLEEP)) Take 1/2 by mouth at bedtime by mouth as needed GLUCOPHAGE 500 MG TABS (METFORMIN HCL) Take one by mouth mid-day daily ASPIRIN 81 MG TABS (ASPIRIN) Take one by mouth daily CITRACAL WITH MAG. ()  DRAMAMINE 50 MG TABS (DIMENHYDRINATE) 1 by mouth as needed MUCINEX 600 MG TB12 (GUAIFENESIN) as needed CENTRUM SILVER   TABS (MULTIPLE VITAMINS-MINERALS) 1 daily DIOVAN HCT 160-25 MG TABS (VALSARTAN-HYDROCHLOROTHIAZIDE) 1 DAILY BY MOUTH ROBITUSSIN DM SUGAR FREE 100-10 MG/5ML SYRP (DEXTROMETHORPHAN-GUAIFENESIN) as needed ACCU-CHEK AVIVA  STRP (GLUCOSE BLOOD) use as directed twice daily  icd.9 code 250.00 ACCU-CHEK AVIVA  KIT (BLOOD GLUCOSE MONITORING SUPPL) lancets   use twice daily as directed icd.9 code 250.00 LANTUS SOLOSTAR 100 UNIT/ML SOLN (INSULIN GLARGINE) 36 units per day or as directed. BD PEN NEEDLE MINI U/F 31G X 5 MM MISC (INSULIN PEN NEEDLE) Use as directed Current Allergies: ! * AVANDIA ! * HYCODAN AVANDIA (ROSIGLITAZONE MALEATE)

## 2011-01-14 NOTE — Assessment & Plan Note (Signed)
Summary: FLU SHOT/ARON/DLO  Nurse Visit   Allergies: 1)  ! * Avandia 2)  ! * Hycodan 3)  Avandia (Rosiglitazone Maleate)  Orders Added: 1)  Flu Vaccine 67yrs + MEDICARE PATIENTS [Q2039] 2)  Administration Flu vaccine - MCR [G0008]   Flu Vaccine Consent Questions     Do you have a history of severe allergic reactions to this vaccine? no    Any prior history of allergic reactions to egg and/or gelatin? no    Do you have a sensitivity to the preservative Thimersol? no    Do you have a past history of Guillan-Barre Syndrome? no    Do you currently have an acute febrile illness? no    Have you ever had a severe reaction to latex? no    Vaccine information given and explained to patient? yes    Are you currently pregnant? no    Lot Number:[id number]   Exp Date:06/12/2011   Site Given  Left Deltoid IM

## 2011-01-14 NOTE — Letter (Signed)
Summary: [facility] Tumor Registry Form  [facility] Tumor Registry Form   Imported By: Beau Fanny 03/20/2010 09:55:03  _____________________________________________________________________  External Attachment:    Type:   Image     Comment:   External Document

## 2011-01-14 NOTE — Assessment & Plan Note (Signed)
Summary: CPX / LFW  R/S FROM 08/07/10   Vital Signs:  Patient profile:   70 year old female Weight:      208.50 pounds BMI:     33.77 Temp:     97.8 degrees F oral Pulse rate:   84 / minute Pulse rhythm:   regular BP sitting:   150 / 78  (left arm) Cuff size:   large  Vitals Entered By: Sydell Axon LPN (August 10, 2010 9:15 AM)  Serial Vital Signs/Assessments:  Time      Position  BP       Pulse  Resp  Temp     By                     128/77                         Ruthe Mannan MD  CC: Medicare physical  Vision Screening:Left eye w/o correction: 20 / 20 Right Eye w/o correction: 20 / 20 Both eyes w/o correction:  20/ 20       25db HL: Left  500 hz: 25db 1000 hz: 25db 2000 hz: 25db 4000 hz: 25db Right  500 hz: 25db 1000 hz: 25db 2000 hz: 25db 4000 hz: 25db    History of Present Illness: Here for Medicare AWV:  1.   Risk factors based on Past M, S, F history: DM- a1c elevated to 8.0 ( was 7.6).  On Glucovance 5-500 two tabs two times a day, Metformin 500 gm daily, Lantus 36 units daily.  States that CBGs running between 99-166.  Denies any episodes of hypoglyemia.  H/o breast CA- gets yearly mammograms.  2.   Physical Activities: very active, works in yard several hours per day 3.   Depression/mood: denies any symptoms of depression 4.   Hearing: see nursing assessment 5.   ADL's: see geriatrics assessment 6.   Fall Risk: not a fall risk 7.   Home Safety:  home is safe 8.   Height, weight, &visual acuity:see nursing assessment 9.   Counseling: discussed importance of managing DM, dilated eye exam, other prevention 10.   Labs ordered based on risk factors: none 11.           Referral Coordination- UTD on referrals 12.           Care Plan- see above. 13.            Cognitive Assessment- see geriatrics assessment.   Allergies: 1)  ! * Avandia 2)  ! * Hycodan 3)  Avandia (Rosiglitazone Maleate)  Review of Systems      See HPI General:  Denies  weakness. Eyes:  Denies blurring. GI:  Denies abdominal pain. MS:  Denies joint pain, joint redness, and joint swelling.  Physical Exam  General:  Well-developed,well-nourished,in no acute distress; alert,appropriate and cooperative throughout examination, mildly overweight. Head:  Normocephalic and atraumatic without obvious abnormalities. Wears wig due to past chemo. Eyes:  Conjunctiva clear bilaterally.  Ears:  External ear exam shows no significant lesions or deformities.  Otoscopic examination reveals clear canals, tympanic membranes are intact bilaterally without bulging, retraction, inflammation or discharge. Hearing is grossly normal bilaterally. Nose:  External nasal examination shows no deformity or inflammation. Nasal mucosa are pink and moist without lesions or exudates. Mouth:  Oral mucosa and oropharynx without lesions or exudates.  Teeth in good repair. Neck:  No deformities, masses, or tenderness noted. Lungs:  Normal  respiratory effort, chest expands symmetrically. Lungs are clear to auscultation, no crackles or wheezes. Heart:  Normal rate and regular rhythm. S1 and S2 normal without gallop, murmur, click, rub or other extra sounds. Abdomen:  Bowel sounds positive,abdomen soft and non-tender without masses, organomegaly or hernias noted. Msk:  right 2nd toe- visible absncess on toe, open, draining, small area of warmth and erythema.   Extremities:  No clubbing, cyanosis, edema, or deformity noted with normal full range of motion of all joints.  R arm with chronic lymphedema. Neurologic:  No cranial nerve deficits noted. Station and gait are normal. Plantar reflexes are down-going bilaterally. DTRs are symmetrical throughout. Sensory, motor and coordinative functions appear intact. Skin:  Blotchy mildly vessicular, erythematous rash of wrists, L>R and lower extremities, bilat ankles mostly. Psych:  Cognition and judgment appear intact. Alert and cooperative with normal attention  span and concentration. No apparent delusions, illusions, hallucinations   Impression & Recommendations:  Problem # 5:  Preventive Health Care (ICD-V70.0) Reviewed preventive care protocols, scheduled due services, and updated immunizations Discussed nutrition, exercise, diet, and healthy lifestyle.  Pt to check on Zostavax with insurance company.  Problem # 6:  DIABETES MELLITUS, TYPE II (ICD-250.00) Assessment: Deteriorated Increase Lantus to 38 units daily.  Continue other meds at current dosages. Her updated medication list for this problem includes:    Glucovance 5-500 Mg Tabs (Glyburide-metformin) .Marland Kitchen... Take 2 tablet by mouth twice a day    Glucophage 500 Mg Tabs (Metformin hcl) .Marland Kitchen... Take one by mouth mid-day daily    Aspirin 81 Mg Tabs (Aspirin) .Marland Kitchen... Take one by mouth daily    Diovan Hct 160-25 Mg Tabs (Valsartan-hydrochlorothiazide) .Marland Kitchen... 1 daily by mouth    Lantus Solostar 100 Unit/ml Soln (Insulin glargine) .Marland KitchenMarland KitchenMarland KitchenMarland Kitchen 38 units per day or as directed.  Complete Medication List: 1)  Glucovance 5-500 Mg Tabs (Glyburide-metformin) .... Take 2 tablet by mouth twice a day 2)  Zetia 10 Mg Tabs (Ezetimibe) .... Take 1 tablet by mouth once a day 3)  Vitamin C 500 Mg Tabs (Ascorbic acid) .... Take one by mouth twice a day 4)  Tylenol Pm Extra Strength 500-25 Mg Tabs (Diphenhydramine-apap (sleep)) .... Take 1/2 by mouth at bedtime by mouth as needed 5)  Glucophage 500 Mg Tabs (Metformin hcl) .... Take one by mouth mid-day daily 6)  Aspirin 81 Mg Tabs (Aspirin) .... Take one by mouth daily 7)  Citracal With Mag.  8)  Dramamine 50 Mg Tabs (Dimenhydrinate) .Marland Kitchen.. 1 by mouth as needed 9)  Mucinex 600 Mg Tb12 (Guaifenesin) .... As needed 10)  Centrum Silver Tabs (Multiple vitamins-minerals) .Marland Kitchen.. 1 daily 11)  Diovan Hct 160-25 Mg Tabs (Valsartan-hydrochlorothiazide) .Marland Kitchen.. 1 daily by mouth 12)  Robitussin Dm Sugar Free 100-10 Mg/71ml Syrp (Dextromethorphan-guaifenesin) .... As needed 13)   Accu-chek Aviva Strp (Glucose blood) .... Use as directed twice daily  icd.9 code 250.00 14)  Accu-chek Aviva Kit (Blood glucose monitoring suppl) .... Lancets  use twice daily as directed icd.9 code 250.00 15)  Lantus Solostar 100 Unit/ml Soln (Insulin glargine) .... 38 units per day or as directed. 16)  Bd Pen Needle Mini U/f 31g X 5 Mm Misc (Insulin pen needle) .... Use as directed  Other Orders: Medicare -1st Annual Wellness Visit [phone])  Patient Instructions: 1)  Please increase your Lantus to 38 Units 2)  Please call your insurance company to make sure they cover the Shingles vaccine.  Current Allergies (reviewed today): ! * AVANDIA ! * HYCODAN AVANDIA (ROSIGLITAZONE  MALEATE)  Flex Sig Next Due:  Not Indicated Colonoscopy Result Date:  04/22/2010 Colonoscopy Result:  normal Colonoscopy Next Due:  10 yr Last Hemoccult Result: Negative (03/13/2000 5:04:09 PM) Hemoccult Next Due:  Not Indicated Last PAP:  Normal, Satisfactory (08/06/2009 5:48:04 PM) PAP Next Due:  2 yr   Prevention & Chronic Care Immunizations   Influenza vaccine: Fluvax 3+  (10/13/2009)   Influenza vaccine due: 10/13/2010    Tetanus booster: 08/01/2008: Td   Tetanus booster due: 08/01/2018    Pneumococcal vaccine: Pneumovax  (10/13/1998)   Pneumococcal vaccine due: None    H. zoster vaccine: Not documented  Colorectal Screening   Hemoccult: Negative  (03/13/2000)   Hemoccult due: Not Indicated    Colonoscopy: normal  (04/22/2010)   Colonoscopy due: 04/22/2020  Other Screening   Pap smear: Normal, Satisfactory  (08/06/2009)   Pap smear action/deferral: Deferred-2 yr interval  (08/10/2010)   Pap smear due: 08/07/2011    Mammogram: Normal Left  (11/03/2009)   Mammogram due: 11/2010    DXA bone density scan: Not documented   DXA bone density action/deferral: Deferred  (08/10/2010)   Smoking status: never  (08/06/2009)  Diabetes Mellitus   HgbA1C: 8.0  (08/06/2010)   Hemoglobin A1C due:  11/06/2010    Eye exam: normal  (02/26/2009)   Diabetic eye exam action/deferral: Deferred  (08/10/2010)   Eye exam due: 03/2010    Foot exam: yes  (02/10/2010)   High risk foot: Not documented   Foot care education: Not documented   Foot exam due: 02/11/2011    Urine microalbumin/creatinine ratio: 4.7  (07/31/2009)   Urine microalbumin action/deferral: Not indicated   Urine microalbumin/cr due: 07/31/2010    Diabetes flowsheet reviewed?: Yes   Progress toward A1C goal: Deteriorated  Lipids   Total Cholesterol: 163  (08/06/2010)   LDL: 105  (08/06/2010)   LDL Direct: Not documented   HDL: 39.60  (08/06/2010)   Triglycerides: 92.0  (08/06/2010)    SGOT (AST): 25  (08/06/2010)   SGPT (ALT): 22  (08/06/2010)   Alkaline phosphatase: 46  (08/06/2010)   Total bilirubin: 0.4  (08/06/2010)    Lipid flowsheet reviewed?: Yes   Progress toward LDL goal: Unchanged  Hypertension   Last Blood Pressure: 150 / 78  (08/10/2010)   Serum creatinine: 0.7  (08/06/2010)   Serum potassium 4.4  (08/06/2010)  Self-Management Support :    Diabetes self-management support: Not documented    Hypertension self-management support: Not documented    Lipid self-management support: Not documented    Nursing Instructions: Refer for screening diabetic eye exam (see order)        Geriatric Assessment:  Activities of Daily Living:    Bathing-independent    Dressing-independent    Eating-independent    Toileting-independent    Transferring-independent    Continence-independent  Instrumental Activities of Daily Living:    Transportation-independent    Meal/Food Preparation-independent    Shopping Errands-independent    Housekeeping/Chores-independent    Money Management/Finances-independent    Medication Management-independent    Ability to Use Telephone-independent    Laundry-independent  Mental Status Exam: (value/max value)    Orientation to Time: 5/5    Orientation to  Place: 5/5    Registration: 3/3    Attention/Calculation: 5/5    Recall: 3/3    Language-name 2 objects: 2/2    Language-repeat: 1/1    Language-follow 3-step command: 3/3    Language-read and follow direction: 1/1    Write a sentence: 1/1  Copy design: 1/1 MSE Total score: 30/30

## 2011-01-14 NOTE — Progress Notes (Signed)
----   Converted from flag ---- ---- 08/05/2010 1:58 PM, Ruthe Mannan MD wrote: BMET (401.9), fasting lipid panel, hepatic panel, (272.4)  ---- 08/05/2010 12:31 PM, Mills Koller wrote: This patient is scheduled for CPX with you, I need lab orders with dx, please. Thanks, Terri ------------------------------

## 2011-01-14 NOTE — Assessment & Plan Note (Signed)
Summary: PAIN IN RIGHT HEEL/ lb   Vital Signs:  Patient profile:   70 year old female Height:      66 inches Weight:      204.75 pounds BMI:     33.17 Temp:     98.1 degrees F oral Pulse rate:   76 / minute Pulse rhythm:   regular BP sitting:   126 / 62  (left arm) Cuff size:   large  Vitals Entered By: Linde Gillis CMA Duncan Dull) (November 24, 2010 8:12 AM) CC: right heel pain   History of Present Illness: [name] is here for right heel pain.  Right heel pain- started several weeks ago.  Has been walking much more with her husband since he had thoracic surgery.  Has not been wearing supportive shoes.   Also has been on her feet much more, cooking for the holidays. No known injury. Pain is intermittent, often a 7/10.  Alleve helps. No swelling, did have a bruise on top of her right foot.  Pain is often worse in the morning.  Going through a stressful time, husband had surgery and now she has to go back for follow up breast ultrasound.  She is a 12 year breast cancer survivor so has a lot of anxiety about this abnormal mammogram.  Current Medications (verified): 1)  Glucovance 5-500 Mg Tabs (Glyburide-Metformin) .... Take 2 Tablet By Mouth Twice A Day 2)  Zetia 10 Mg Tabs (Ezetimibe) .... Take 1 Tablet By Mouth Once A Day 3)  Vitamin C 500 Mg Tabs (Ascorbic Acid) .... Take One By Mouth Twice A Day 4)  Tylenol Pm Extra Strength 500-25 Mg Tabs (Diphenhydramine-Apap (Sleep)) .... Take 1/2 By Mouth At Bedtime By Mouth As Needed 5)  Glucophage 500 Mg Tabs (Metformin Hcl) .... Take One By Mouth Mid-Day Daily 6)  Aspirin 81 Mg Tabs (Aspirin) .... Take One By Mouth Daily 7)  Citracal With Mag. 8)  Dramamine 50 Mg Tabs (Dimenhydrinate) .Marland Kitchen.. 1 By Mouth As Needed 9)  Mucinex 600 Mg Tb12 (Guaifenesin) .... As Needed 10)  Centrum Silver   Tabs (Multiple Vitamins-Minerals) .Marland Kitchen.. 1 Daily 11)  Diovan Hct 160-25 Mg Tabs (Valsartan-Hydrochlorothiazide) .Marland Kitchen.. 1 Daily By Mouth 12)  Robitussin Dm  Sugar Free 100-10 Mg/74ml Syrp (Dextromethorphan-Guaifenesin) .... As Needed 13)  Accu-Chek Aviva  Strp (Glucose Blood) .... Use As Directed Twice Daily  Icd.9 Code 250.00 14)  Accu-Chek Aviva  Kit (Blood Glucose Monitoring Suppl) .... Lancets  Use Twice Daily As Directed Icd.9 Code 250.00 15)  Lantus Solostar 100 Unit/ml Soln (Insulin Glargine) .... Inject 38 Units and As Needed On A Sliding Scale 16)  Bd Pen Needle Mini U/f 31g X 5 Mm Misc (Insulin Pen Needle) .... Use As Directed  Allergies: 1)  ! * Avandia 2)  ! * Hycodan 3)  Avandia (Rosiglitazone Maleate)  Past History:  Past Medical History: Last updated: 04/26/2007 Diabetes mellitus, type II Hypertension Hypothyroidism Osteopenia  Past Surgical History: Last updated: 04/27/2010 NSVD x 5 H/o Mumps 1965 Misscarriage 1970 Septoplasty 1970 Appendectomy Hysterectomy, partiial., fibroids 1974 Tonsillectomy 1945 Foot surgery, bilat. 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 02/24/06 DEXA Nml hip and spine 10/25/07 DEXA Nml hip and spine 01/21/10 Colonoscopy [facility] (Dr Mechele Collin) 04/22/10  Family History: Last updated: 08/06/2009 Father:  Died at age 34, CHF Mother: Died at age 49, NIDDM, HTN, MI Brother A 5 CVA Lung CA (h/o smoking) PTCA MI x 2  Social History: Last updated: 08/01/2007 Marital Status: Married 50th anniv ( 8/08) Children: 5 Occupation: Retired Runner, broadcasting/film/video  Risk Factors: Alcohol Use: 0 (08/06/2009) Caffeine Use: 3 (08/06/2009) Exercise: no (08/06/2009)  Risk Factors: Smoking Status: never (08/06/2009) Passive Smoke Exposure: no (08/06/2009)  Review of Systems      See HPI MS:  Complains of joint pain; denies joint redness, joint swelling, loss of strength, and  muscle weakness.  Physical Exam  General:  Well-developed,well-nourished,in no acute distress; alert,appropriate and cooperative throughout examination, mildly overweight. Psych:  Cognition and judgment appear intact. Alert and cooperative with normal attention span and concentration. No apparent delusions, illusions, hallucinations   Foot/Ankle Exam  Foot Exam:    Right:    Inspection:  Normal    Palpation:  Abnormal       Location:  heel, plantar fascia    Stability:  stable    Tenderness:  yes    Swelling:  no    Erythema:  no    local point tenderness over base of heel and plantar fascia.     Range of Motion:       Hallux MTP Dorsiflex-Active: 60       Hallux MTP Plantar Flex-Active: 45       Hallux IP-Active: full       Hallux MTP Dorsiflex-Passive: 60       Hallux MTP Plantar Flex-Passive: 45       Hallux IP-Passive: full    Left:    Inspection:  Normal    Palpation:  Normal    Stability:  stable    Tenderness:  no    Swelling:  no    Erythema:  no    Range of Motion:       Hallux MTP Dorsiflex-Active: 60       Hallux MTP Plantar Flex-Active: 45       Hallux IP-Active: full       Hallux MTP Dorsiflex-Passive: 60       Hallux MTP Plantar Flex-Passive: 45       Hallux IP-Passive: full   Impression & Recommendations:  Problem # 1:  HEEL PAIN, RIGHT (ICD-729.5) Assessment New Seems most consistent with plantar fasicitis but given duration and severity of symptoms, will get an xray to rule out heel spur and/or stress fracture. Continue NSAIDs as needed, discuss rest, ice and supportive foot wear. Orders: T-Foot Right (73630TC)  Complete Medication List: 1)  Glucovance 5-500 Mg Tabs (Glyburide-metformin) .... Take 2 tablet by mouth twice a day 2)  Zetia 10 Mg Tabs (Ezetimibe) .... Take 1 tablet by mouth once a day 3)  Vitamin C 500 Mg Tabs (Ascorbic acid) .... Take one by mouth twice a day 4)  Tylenol Pm Extra Strength 500-25 Mg Tabs (Diphenhydramine-apap  (sleep)) .... Take 1/2 by mouth at bedtime by mouth as needed 5)  Glucophage 500 Mg Tabs (Metformin hcl) .... Take one by mouth mid-day daily 6)  Aspirin 81 Mg Tabs (Aspirin) .... Take one by mouth daily 7)  Citracal With Mag.  8)  Dramamine 50 Mg Tabs (Dimenhydrinate) .Marland Kitchen.. 1 by mouth as needed 9)  Mucinex 600 Mg Tb12 (Guaifenesin) .... As needed 10)  Centrum Silver Tabs (Multiple vitamins-minerals) .Marland Kitchen.. 1 daily 11)  Diovan Hct 160-25 Mg Tabs (Valsartan-hydrochlorothiazide) .Marland Kitchen.. 1 daily by mouth 12)  Robitussin Dm Sugar Free 100-10 Mg/16ml  Syrp (Dextromethorphan-guaifenesin) .... As needed 13)  Accu-chek Aviva Strp (Glucose blood) .... Use as directed twice daily  icd.9 code 250.00 14)  Accu-chek Aviva Kit (Blood glucose monitoring suppl) .... Lancets  use twice daily as directed icd.9 code 250.00 15)  Lantus Solostar 100 Unit/ml Soln (Insulin glargine) .... Inject 38 units and as needed on a sliding scale 16)  Bd Pen Needle Mini U/f 31g X 5 Mm Misc (Insulin pen needle) .... Use as directed   Orders Added: 1)  T-Foot Right [73630TC] 2)  Est. Patient Level IV [16109]    Current Allergies (reviewed today): ! * AVANDIA ! * HYCODAN AVANDIA (ROSIGLITAZONE MALEATE)

## 2011-01-14 NOTE — Consult Note (Signed)
Summary: [facility]  [facility]   Imported By: Lanelle Bal 04/23/2010 11:06:05  _____________________________________________________________________  External Attachment:    Type:   Image     Comment:   External Document  Appended Document: [facility]    Clinical Lists Changes  Observations: Added new observation of PAST SURG HX: NSVD x 5 H/o Mumps 1965 Misscarriage 1970 Septoplasty 1970 Appendectomy Hysterectomy, partiial., fibroids 1974 Tonsillectomy 1945 Foot surgery, bilat. 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

## 2011-01-14 NOTE — Miscellaneous (Signed)
Summary: med list update- lancets  Clinical Lists Changes  Medications: Added new medication of ACCU-CHEK MULTICLIX LANCETS  MISC (LANCETS) use as directed     Prior Medications: GLUCOVANCE 5-500 MG TABS (GLYBURIDE-METFORMIN) Take 2 tablet by mouth twice a day ZETIA 10 MG TABS (EZETIMIBE) Take 1 tablet by mouth once a day VITAMIN C 500 MG TABS (ASCORBIC ACID) Take one by mouth twice a day TYLENOL PM EXTRA STRENGTH 500-25 MG TABS (DIPHENHYDRAMINE-APAP (SLEEP)) Take 1/2 by mouth at bedtime by mouth as needed GLUCOPHAGE 500 MG TABS (METFORMIN HCL) Take one by mouth mid-day daily ASPIRIN 81 MG TABS (ASPIRIN) Take one by mouth daily CITRACAL WITH MAG. ()  DRAMAMINE 50 MG TABS (DIMENHYDRINATE) 1 by mouth as needed MUCINEX 600 MG TB12 (GUAIFENESIN) as needed CENTRUM SILVER   TABS (MULTIPLE VITAMINS-MINERALS) 1 daily DIOVAN HCT 160-25 MG TABS (VALSARTAN-HYDROCHLOROTHIAZIDE) 1 DAILY BY MOUTH ROBITUSSIN DM SUGAR FREE 100-10 MG/5ML SYRP (DEXTROMETHORPHAN-GUAIFENESIN) as needed ACCU-CHEK AVIVA  STRP (GLUCOSE BLOOD) use as directed twice daily  icd.9 code 250.00 ACCU-CHEK AVIVA  KIT (BLOOD GLUCOSE MONITORING SUPPL) lancets  use twice daily as directed icd.9 code 250.00 LANTUS SOLOSTAR 100 UNIT/ML SOLN (INSULIN GLARGINE) inject 38 units and as needed on a sliding scale BD PEN NEEDLE MINI U/F 31G X 5 MM MISC (INSULIN PEN NEEDLE) Use as directed ACCU-CHEK MULTICLIX LANCETS  MISC (LANCETS) use as directed Current Allergies: ! * AVANDIA ! * HYCODAN AVANDIA (ROSIGLITAZONE MALEATE)

## 2011-01-14 NOTE — Miscellaneous (Signed)
Summary: BONE DENSITY  Clinical Lists Changes  Orders: Added new Test order of T-Bone Densitometry (77080) - Signed Added new Test order of T-Lumbar Vertebral Assessment (77082) - Signed 

## 2011-01-28 NOTE — Letter (Addendum)
Summary: [facility]  [facility]   Imported By: Kassie Mends 01/20/2011 10:46:36  _____________________________________________________________________  External Attachment:    Type:   Image     Comment:   External Document  Appended Document: [facility], do we need to order additional studies, I am assuming that the [facility] will do that but I wanted to make sure.  Appended Document: [facility] Called patient to discuss this with her, spoke with her spouse and she was busy.  Asked him to have her call back at her convenience.  Appended Document: [facility] Spoke with patient and she stated that she had a biopsy in December and a f/u in Jan. and was cleared by her doctor.  She does not need any additional studies, images, etc.

## 2011-01-29 ENCOUNTER — Ambulatory Visit: Payer: Self-pay

## 2011-02-18 NOTE — Letter (Signed)
[facility]   Imported By: Maryln Gottron 02/05/2011 15:56:24  _____________________________________________________________________  External Attachment:    Type:   Image     Comment:   External Document

## 2011-05-12 ENCOUNTER — Other Ambulatory Visit: Payer: Self-pay | Admitting: Family Medicine

## 2011-05-12 DIAGNOSIS — Z136 Encounter for screening for cardiovascular disorders: Secondary | ICD-10-CM

## 2011-05-12 DIAGNOSIS — E039 Hypothyroidism, unspecified: Secondary | ICD-10-CM

## 2011-05-12 DIAGNOSIS — I10 Essential (primary) hypertension: Secondary | ICD-10-CM

## 2011-05-12 DIAGNOSIS — E119 Type 2 diabetes mellitus without complications: Secondary | ICD-10-CM

## 2011-05-14 ENCOUNTER — Encounter: Payer: Self-pay | Admitting: Family Medicine

## 2011-05-14 ENCOUNTER — Other Ambulatory Visit (INDEPENDENT_AMBULATORY_CARE_PROVIDER_SITE_OTHER): Payer: Medicare Other | Admitting: Family Medicine

## 2011-05-14 DIAGNOSIS — E119 Type 2 diabetes mellitus without complications: Secondary | ICD-10-CM

## 2011-05-14 DIAGNOSIS — E039 Hypothyroidism, unspecified: Secondary | ICD-10-CM

## 2011-05-14 DIAGNOSIS — Z136 Encounter for screening for cardiovascular disorders: Secondary | ICD-10-CM

## 2011-05-14 LAB — LIPID PANEL
HDL: 46.5 mg/dL (ref >39.00)
VLDL: 24 mg/dL (ref 0.0–40.0)

## 2011-05-14 LAB — HM PAP SMEAR

## 2011-05-14 LAB — HM DIABETES FOOT EXAM

## 2011-05-14 LAB — TSH: TSH: 3.25 u[IU]/mL (ref 0.35–5.50)

## 2011-05-14 NOTE — Progress Notes (Signed)
Addended by: Baldomero Lamy on: 05/14/2011 12:34 PM   Modules accepted: Orders

## 2011-05-18 ENCOUNTER — Encounter: Payer: Self-pay | Admitting: Family Medicine

## 2011-05-18 ENCOUNTER — Telehealth: Payer: Self-pay | Admitting: *Deleted

## 2011-05-18 ENCOUNTER — Ambulatory Visit (INDEPENDENT_AMBULATORY_CARE_PROVIDER_SITE_OTHER): Payer: Medicare Other | Admitting: Family Medicine

## 2011-05-18 VITALS — BP 140/62 | HR 71 | Temp 97.9°F | Ht 66.0 in | Wt 204.4 lb

## 2011-05-18 DIAGNOSIS — I10 Essential (primary) hypertension: Secondary | ICD-10-CM

## 2011-05-18 DIAGNOSIS — E119 Type 2 diabetes mellitus without complications: Secondary | ICD-10-CM

## 2011-05-18 DIAGNOSIS — E039 Hypothyroidism, unspecified: Secondary | ICD-10-CM

## 2011-05-18 DIAGNOSIS — Z0110 Encounter for examination of ears and hearing without abnormal findings: Secondary | ICD-10-CM

## 2011-05-18 DIAGNOSIS — Z0000 Encounter for general adult medical examination without abnormal findings: Secondary | ICD-10-CM | POA: Insufficient documentation

## 2011-05-18 LAB — MAGNESIUM: Magnesium: 1.5 mg/dL (ref 1.5–2.5)

## 2011-05-18 LAB — BASIC METABOLIC PANEL
CO2: 28 mEq/L (ref 19–32)
Chloride: 97 mEq/L (ref 96–112)
Creatinine, Ser: 0.8 mg/dL (ref 0.4–1.2)
Potassium: 4.1 mEq/L (ref 3.5–5.1)
Sodium: 134 mEq/L — ABNORMAL LOW (ref 135–145)

## 2011-05-18 LAB — HEMOGLOBIN A1C: Hgb A1c MFr Bld: 7.9 % — ABNORMAL HIGH (ref 4.6–6.5)

## 2011-05-18 NOTE — Progress Notes (Signed)
Here for Medicare AWV:  1. Risk factors based on Past M, S, F history: On Glucovance 5-500 two tabs two times a day, Metformin 500 gm daily, Lantus 26 units daily.  States that CBGs running between 99-166.  Had one episode of hypoglyemia several weeks ago which is why she has decreased her sliding scale.    H/o breast CA- gets yearly mammograms.  2. Physical Activities: very active, works in yard several hours per day 3. Depression/mood: denies any symptoms of depression 4. Hearing: see nursing assessment 5. ADL's: indepdenent 6. Fall Risk: not a fall risk 7. Home Safety:  home is safe 8. Height, weight, &visual acuity:see nursing assessment 9. Counseling: discussed importance of managing DM, dilated eye exam, other prevention 10. Labs ordered based on risk factors: none 11.           Referral Coordination- UTD on referrals 12.           Care Plan- see above. 13.            Cognitive Assessment- see geriatrics assessment.  The PMH, PSH, Social History, Family History, Medications, and allergies have been reviewed in [facility], and have been updated if relevant.   Review of Systems       See HPI General:  Denies weakness. Eyes:  Denies blurring. GI:  Denies abdominal pain. MS:  Denies joint pain, joint redness, and joint swelling.  Physical Exam BP 140/62  Pulse 71  Temp(Src) 97.9 F (36.6 C) (Oral)  Ht 5\' 6"  (1.676 m)  Wt 204 lb 6.4 oz (92.715 kg)  BMI 32.99 kg/m2  General:  Well-developed,well-nourished,in no acute distress; alert,appropriate and cooperative throughout examination, mildly overweight. Head:  Normocephalic and atraumatic without obvious abnormalities. Wears wig due to past chemo. Eyes:  Conjunctiva clear bilaterally.  Ears:  External ear exam shows no significant lesions or deformities.  Otoscopic examination reveals clear canals, tympanic membranes are intact bilaterally without bulging, retraction, inflammation or discharge. Hearing is grossly normal  bilaterally. Nose:  External nasal examination shows no deformity or inflammation. Nasal mucosa are pink and moist without lesions or exudates. Mouth:  Oral mucosa and oropharynx without lesions or exudates.  Teeth in good repair. Neck:  No deformities, masses, or tenderness noted. Lungs:  Normal respiratory effort, chest expands symmetrically. Lungs are clear to auscultation, no crackles or wheezes. Heart:  Normal rate and regular rhythm. S1 and S2 normal without gallop, murmur, click, rub or other extra sounds. Abdomen:  Bowel sounds positive,abdomen soft and non-tender without masses, organomegaly or hernias noted. Msk:  right 2nd toe- visible absncess on toe, open, draining, small area of warmth and erythema.   Extremities:  No clubbing, cyanosis, edema, or deformity noted with normal full range of motion of all joints.  R arm with chronic lymphedema. Neurologic:  No cranial nerve deficits noted. Station and gait are normal. Plantar reflexes are down-going bilaterally. DTRs are symmetrical throughout. Sensory, motor and coordinative functions appear intact. Psych:  Cognition and judgment appear intact. Alert and cooperative with normal attention span and concentration. No apparent delusions, illusions, hallucinations GU: Normal introitus for age, no external lesions, no vaginal discharge, mucosa pink and moist, no vaginal  lesions, no vaginal atrophy, no friaility or hemorrhage, no adnexal masses or tenderness.  Chaperoned exam.  Rectal:  No hemorrhoids or other lesions

## 2011-05-18 NOTE — Assessment & Plan Note (Signed)
The patients weight, height, BMI and visual acuity have been recorded in the chart I have made referrals, counseling and provided education to the patient based review of the above and I have provided the pt with a written personalized care plan for preventive services.  

## 2011-05-18 NOTE — Telephone Encounter (Signed)
Patient stated that Dr. Dayton Martes was going to call in a Rx strength powder to [facility] for her to [place] in her groin area to help eliminate moisture.  Please advise.

## 2011-05-19 MED ORDER — NYSTATIN 100000 UNIT/GM EX POWD: CUTANEOUS | Status: DC

## 2011-05-19 NOTE — Telephone Encounter (Signed)
Left message on machine at home, Rx sent to pharmacy.

## 2011-05-19 NOTE — Telephone Encounter (Signed)
rx sent

## 2011-05-24 ENCOUNTER — Other Ambulatory Visit: Payer: Self-pay | Admitting: *Deleted

## 2011-05-24 MED ORDER — EZETIMIBE 10 MG PO TABS: 10.0000 mg | ORAL_TABLET | Freq: Every day | ORAL | Status: DC

## 2011-07-14 ENCOUNTER — Encounter: Payer: Self-pay | Admitting: Family Medicine

## 2011-07-14 ENCOUNTER — Ambulatory Visit (INDEPENDENT_AMBULATORY_CARE_PROVIDER_SITE_OTHER): Payer: Medicare Other | Admitting: Family Medicine

## 2011-07-14 DIAGNOSIS — R42 Dizziness and giddiness: Secondary | ICD-10-CM

## 2011-07-14 MED ORDER — MECLIZINE HCL 12.5 MG PO TABS: 12.5000 mg | ORAL_TABLET | Freq: Three times a day (TID) | ORAL | Status: AC | PRN

## 2011-07-14 NOTE — Progress Notes (Signed)
Subjective:     [name] is a 70 y.o. female who presents for evaluation of dizziness. The symptoms started 3 days ago and are ongoing. The attacks occur daily and last 2 minutes. Positions that worsen symptoms: rolling in bed to the right. Previous workup/treatments: none. Associated ear symptoms: aural pressure. Associated CNS symptoms: none. Recent infections: none. Head trauma: denied. Drug ingestion: none. Noise exposure: no occupational exposure. Family history: non-contributory.  Patient Active Problem List  Diagnoses  . CARCINOMA, BREAST, RIGHT  . HYPOTHYROIDISM  . DIABETES MELLITUS, TYPE II  . HYPERTENSION  . HIATAL HERNIA  . SHOULDER PAIN, RIGHT  . ALLERGY, ENVIRONMENTAL  . HEEL PAIN, RIGHT  . Routine general medical examination at a health care facility  . Routine general medical examination at a health care facility   Past Medical History  Diagnosis Date  . Diabetes mellitus   . Hypertension   . Thyroid disease   . Osteoporosis    Past Surgical History  Procedure Date  . Mumps 1965    h/o  . Miscarriage 1970  . Septoplasty 1970  . Appendectomy   . Vaginal hysterectomy 1974    partial, fibroids  . Tonsillectomy 1945  . Foot surgery 1992 & 1994    bilateral  . Back surgery 1983    discectomy  . Ventral hernia repair   . Mastectomy     right, modified radical+ chemo, 2/23 nodes 4/03  . Colonoscopy   . Vaginal delivery     x5  . Other surgical history 04/03/02    dexa, wnl  . Other surgical history 03/12/04    dexa, wnl  . Doppler echocardiography 02/24/06    Triv MR, EF 55-60%  . Other surgical history 08/12/05    dexa-osteopor Hip, osteopen spine  . 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 Other surgical history 10/25/07    dexa- nml hip and spine   History  Substance Use Topics  . Smoking status: Never Smoker   . Smokeless tobacco: Not on file  . Alcohol Use: Not on file   Family History  Problem Relation Age of Onset  . Heart  failure Father   . Hypertension Father   . Stroke Father   . Lung cancer Brother     hx of smoking  . Diabetes Mother   . Hypertension Mother   . Heart attack Mother   . Stroke Brother   . Heart attack Brother     PTCA MI x 2   Allergies  Allergen Reactions  . Hydrocodone-Homatropine     REACTION: DROVE HER UP THE WALL  . Rosiglitazone Maleate     REACTION: SWELLING   Current Outpatient Prescriptions on File Prior to Visit  Medication Sig Dispense Refill  . aspirin 81 MG tablet Take 81 mg by mouth daily.        . Blood Glucose Monitoring Suppl (ACCU-CHEK AVIVA PLUS) W/DEVICE KIT Use to check blood sugar twice daily       . Calcium Citrate (CITRACAL PO) Take 1 tablet by mouth daily.        Marland Kitchen dimenhyDRINATE (DRAMAMINE) 50 MG tablet Take 50 mg by mouth as needed.        . diphenhydramine-acetaminophen (TYLENOL PM) 25-500 MG TABS Take 1/2 tablet by mouth at bedtime as needed       . ezetimibe (ZETIA) 10 MG tablet Take 1 tablet (10 mg total) by mouth daily.  30 tablet  11  .  glucose blood test strip (Accu-chek compact) Use as instructed       . glyBURIDE-metformin (GLUCOVANCE) 5-500 MG per tablet Take 2 tablets by mouth 2 (two) times daily.        . insulin glargine (LANTUS) 100 UNIT/ML injection Inject 38 units and as needed on a sliding scale       . Insulin Pen Needle (B-D UF III MINI PEN NEEDLES) 31G X 5 MM MISC Use as directed       . Lancets (ACCU-CHEK MULTICLIX) lancets Use as instructed       . metFORMIN (GLUCOPHAGE) 500 MG tablet Take 500 mg by mouth daily.        . Multiple Vitamins-Minerals (CENTRUM SILVER PO) Take 1 tablet by mouth daily.        Marland Kitchen nystatin (MYCOSTATIN) powder Apply to affected area twice daily as needed.  15 g  0  . valsartan-hydrochlorothiazide (DIOVAN-HCT) 160-25 MG per tablet Take 1 tablet by mouth daily.        . vitamin C (ASCORBIC ACID) 500 MG tablet Take 500 mg by mouth 2 (two) times daily.         The PMH, PSH, Social History, Family History,  Medications, and allergies have been reviewed in [facility], and have been updated if relevant.   Review of Systems Pertinent items are noted in HPI.    Objective:  BP 140/80  Pulse 77  Temp(Src) 97.7 F (36.5 C) (Oral)  Ht 5' 6.5" (1.689 m)  Wt 202 lb 12.8 oz (91.989 kg)  BMI 32.24 kg/m2  SpO2 97%  Gen:  Alert, pleasant NAD HEENT:  +pos nystagmus with Dx Hallpike Neuro:  CNII-XII intact, gait normal, strength normal bilaterally    Assessment:    Benign positional vertigo    Plan:    Meclizine per medication orders. OTC decongestants.

## 2011-07-14 NOTE — Patient Instructions (Signed)
Vertigo (Dizziness) Your exam shows you have had an episode of dizziness or vertigo. Dizziness can be caused by many different problems. These include low blood pressure, narrowed blood vessels in the brain, neurologic and heart problems, low blood sugar, and emotional states. True vertigo causes a false sense of movement such as a spinning feeling or walls that seem to move. Vertigo is usually made worse by changing position and relieved by resting. This type of vertigo is often thought to be caused by a virus infection involving the inner ear, or rarely, an unusual migraine headache. The treatment of vertigo includes:  Bed rest and clear liquids.   Medications to reduce dizziness, nausea, and vomiting.   Avoid alcohol, tranquilizers, nicotine, caffeine, and recreational drugs.  Most of the time benign vertigo is much better after 3 days. However, mild unsteadiness may last for up to 3 months in some patients. An MRI scan or other special tests to evaluate your hearing and balance may be needed if the vertigo does not improve or returns in the future. Please see your doctor at once or go to the emergency room if you have any of these warning signs:  Increasing vertigo, earache, ear drainage, or loss of hearing.   Severe headache, blurred or double vision, or trouble walking.   Fainting, extreme weakness, chest pain, or palpitations.   Fever, persistent vomiting, or dehydration.   Numbness or weakness of the limbs.  Document Released: 11/29/2005 Document Re-Released: 02/23/2010 [facility] Patient Information 2011 [place], [place].  Please take the Meclizine as needed. Sudafed as needed- follow directions on box.

## 2011-07-26 ENCOUNTER — Telehealth: Payer: Self-pay | Admitting: *Deleted

## 2011-07-26 MED ORDER — AMOXICILLIN 500 MG PO TABS: 500.0000 mg | ORAL_TABLET | Freq: Two times a day (BID) | ORAL | Status: AC

## 2011-07-26 NOTE — Telephone Encounter (Signed)
Spoke with pt, she says she can take amox- and that is what she usually gets.

## 2011-07-26 NOTE — Telephone Encounter (Signed)
Please verify no allergies to abx. If no allergies, ok to call in rx for amoxicillin 500 mg twice daily x 10 days, number 20 with no refills. If no improvement in 5-7 days, needs to be seen.

## 2011-07-26 NOTE — Telephone Encounter (Signed)
Rx sent to pharmacy   

## 2011-07-26 NOTE — Telephone Encounter (Signed)
Pt was seen on 8/1 for sinus symptoms- dizziness, congestion.  She is not any better.  She is taking meclizine and sudafed.  The meclizine helps with the dizziness but the sudafed isnt helping at all with the congestion.  She has a lot of pressure.  She is asking that an antibiotic be called to [name].  Her blood sugar has been up and she says that is a good indication to her that she has an infection.  She will come back in if she needs to.

## 2011-07-26 NOTE — Telephone Encounter (Signed)
Left message for patient to return my call.

## 2011-08-05 ENCOUNTER — Other Ambulatory Visit: Payer: Self-pay | Admitting: *Deleted

## 2011-08-05 MED ORDER — INSULIN PEN NEEDLE 31G X 5 MM MISC: Status: DC

## 2011-08-23 ENCOUNTER — Other Ambulatory Visit: Payer: Self-pay | Admitting: *Deleted

## 2011-08-23 MED ORDER — METFORMIN HCL 500 MG PO TABS: 500.0000 mg | ORAL_TABLET | Freq: Every day | ORAL | Status: DC

## 2011-08-24 ENCOUNTER — Other Ambulatory Visit: Payer: Self-pay | Admitting: *Deleted

## 2011-08-24 MED ORDER — GLUCOSE BLOOD VI STRP: ORAL_STRIP | Status: DC

## 2011-08-30 ENCOUNTER — Other Ambulatory Visit: Payer: Self-pay | Admitting: *Deleted

## 2011-08-30 MED ORDER — GLYBURIDE-METFORMIN 5-500 MG PO TABS: 2.0000 | ORAL_TABLET | Freq: Two times a day (BID) | ORAL | Status: DC

## 2011-09-17 ENCOUNTER — Other Ambulatory Visit: Payer: Self-pay | Admitting: *Deleted

## 2011-09-17 MED ORDER — INSULIN GLARGINE 100 UNIT/ML ~~LOC~~ SOLN: SUBCUTANEOUS | Status: DC

## 2011-10-04 ENCOUNTER — Other Ambulatory Visit: Payer: Self-pay | Admitting: *Deleted

## 2011-10-04 MED ORDER — VALSARTAN-HYDROCHLOROTHIAZIDE 160-25 MG PO TABS: 1.0000 | ORAL_TABLET | Freq: Every day | ORAL | Status: DC

## 2011-10-04 NOTE — Telephone Encounter (Signed)
Received faxed refill request from pharmacy. Refill sent to pharmacy electronically. 

## 2011-10-22 ENCOUNTER — Telehealth: Payer: Self-pay | Admitting: Internal Medicine

## 2011-10-22 NOTE — Telephone Encounter (Signed)
Patient called and stated she is coming in next Thursday for her Flu shot and wanted to know if she could get her pneumonia vaccine then also.  Please advise.

## 2011-10-22 NOTE — Telephone Encounter (Signed)
I typically do not like to give that at the exact same time.

## 2011-10-22 NOTE — Telephone Encounter (Signed)
Spoke with patients spouse and advised him as instructed.  Patient should at least wait one month between vaccines.

## 2011-10-28 ENCOUNTER — Ambulatory Visit (INDEPENDENT_AMBULATORY_CARE_PROVIDER_SITE_OTHER): Payer: Medicare Other

## 2011-10-28 DIAGNOSIS — Z23 Encounter for immunization: Secondary | ICD-10-CM

## 2011-12-23 ENCOUNTER — Ambulatory Visit: Payer: Medicare Other

## 2011-12-24 ENCOUNTER — Other Ambulatory Visit: Payer: Self-pay | Admitting: *Deleted

## 2011-12-24 MED ORDER — GLYBURIDE-METFORMIN 5-500 MG PO TABS: 2.0000 | ORAL_TABLET | Freq: Two times a day (BID) | ORAL | Status: DC

## 2011-12-28 ENCOUNTER — Ambulatory Visit (INDEPENDENT_AMBULATORY_CARE_PROVIDER_SITE_OTHER): Payer: Medicare Other | Admitting: *Deleted

## 2011-12-28 ENCOUNTER — Telehealth: Payer: Self-pay | Admitting: *Deleted

## 2011-12-28 DIAGNOSIS — Z23 Encounter for immunization: Secondary | ICD-10-CM

## 2011-12-28 NOTE — Telephone Encounter (Signed)
Ok to have shingles vaccine but please make sure she is aware that she needs to check with insurance to find out how much she will have to pay for it.

## 2011-12-28 NOTE — Telephone Encounter (Signed)
Patient wants to know if she should get the Shingles vaccine?  She doesn't remember having chicken pox as a child and I advised to her that no matter if she had or didn't have the chicken pox she still can get shingles as an adult.  Please advise.

## 2011-12-28 NOTE — Telephone Encounter (Signed)
Spoke with patients spouse and advised as instructed.  Patient will call back and schedule nurse visit.  Patient has already checked with her insurance company [employer]) and they will cover at 100%.

## 2011-12-30 ENCOUNTER — Ambulatory Visit: Payer: Self-pay | Admitting: Internal Medicine

## 2012-01-04 ENCOUNTER — Ambulatory Visit: Payer: Self-pay | Admitting: Internal Medicine

## 2012-01-13 LAB — COMPREHENSIVE METABOLIC PANEL
Albumin: 3.4 g/dL (ref 3.4–5.0)
Anion Gap: 8 (ref 7–16)
BUN: 17 mg/dL (ref 7–18)
Bilirubin,Total: 0.4 mg/dL (ref 0.2–1.0)
Chloride: 96 mmol/L — ABNORMAL LOW (ref 98–107)
Creatinine: 1.04 mg/dL (ref 0.60–1.30)
EGFR (African American): >60
Glucose: 216 mg/dL — ABNORMAL HIGH (ref 65–99)
Potassium: 4.7 mmol/L (ref 3.5–5.1)
SGOT(AST): 22 U/L (ref 15–37)
SGPT (ALT): 26 U/L
Total Protein: 7.4 g/dL (ref 6.4–8.2)

## 2012-01-13 LAB — CBC CANCER CENTER
Basophil %: 0.4 %
Eosinophil #: 0.3 x10 3/mm (ref 0.0–0.7)
Eosinophil %: 4.1 %
HGB: 13.5 g/dL (ref 12.0–16.0)
Lymphocyte #: 1.5 x10 3/mm (ref 1.0–3.6)
Lymphocyte %: 18.5 %
MCH: 29.3 pg (ref 26.0–34.0)
MCHC: 34.1 g/dL (ref 32.0–36.0)
MCV: 86 fL (ref 80–100)
Monocyte #: 0.7 x10 3/mm (ref 0.0–0.7)
Neutrophil #: 5.4 x10 3/mm (ref 1.4–6.5)
RBC: 4.6 10*6/uL (ref 3.80–5.20)
RDW: 13.3 % (ref 11.5–14.5)
WBC: 7.9 x10 3/mm (ref 3.6–11.0)

## 2012-01-14 ENCOUNTER — Ambulatory Visit: Payer: Self-pay | Admitting: Internal Medicine

## 2012-01-21 ENCOUNTER — Encounter: Payer: Self-pay | Admitting: Family Medicine

## 2012-01-21 ENCOUNTER — Ambulatory Visit (INDEPENDENT_AMBULATORY_CARE_PROVIDER_SITE_OTHER): Payer: Medicare Other | Admitting: Family Medicine

## 2012-01-21 ENCOUNTER — Telehealth: Payer: Self-pay | Admitting: Family Medicine

## 2012-01-21 VITALS — BP 136/76 | HR 92 | Temp 99.2°F | Wt 203.8 lb

## 2012-01-21 DIAGNOSIS — J40 Bronchitis, not specified as acute or chronic: Secondary | ICD-10-CM

## 2012-01-21 MED ORDER — AZITHROMYCIN 250 MG PO TABS: ORAL_TABLET | ORAL | Status: DC

## 2012-01-21 MED ORDER — GUAIFENESIN-CODEINE 100-10 MG/5ML PO SYRP: 5.0000 mL | ORAL_SOLUTION | Freq: Two times a day (BID) | ORAL | Status: AC | PRN

## 2012-01-21 NOTE — Progress Notes (Signed)
  Subjective:    Patient ID: [name], female    DOB: 02-Sep-1941, 71 y.o.   MRN: [id number]  HPI CC: cough  H/o breast cancer s/p R mastectomy with residual RUE lymphedema. H/o T2DM, well controlled. Recent partial dry socket after tooth extraction last week, followed by dentist  Trying to save money over winter - using wood stove.  Went outside to straighten patio, was outside breathing smoke from 2 different chimneys.  Since then started coughing (2wks ago).  Also noticing some chest tightness and started getting head congestion today along with PNDrainage.  Raw throat.  Cough worse at night time.  Has tried robitussin DM sugar free.  No fevers/chills, abd pain, n/v, ear pain, ST.  + sick contacts at home (daughter with norovirus).  Son in law smokes outside.  No h/o asthma, COPD.  Review of Systems Per HPI    Objective:   Physical Exam  Nursing note and vitals reviewed. Constitutional: She appears well-developed and well-nourished. No distress.  HENT:  Head: Normocephalic and atraumatic.  Right Ear: Hearing, tympanic membrane, external ear and ear canal normal.  Left Ear: Hearing, tympanic membrane, external ear and ear canal normal.  Nose: Nose normal. No mucosal edema or rhinorrhea. Right sinus exhibits no maxillary sinus tenderness and no frontal sinus tenderness. Left sinus exhibits no maxillary sinus tenderness and no frontal sinus tenderness.  Mouth/Throat: Uvula is midline, oropharynx is clear and moist and mucous membranes are normal. No oropharyngeal exudate, posterior oropharyngeal edema, posterior oropharyngeal erythema or tonsillar abscesses.  Eyes: Conjunctivae and EOM are normal. Pupils are equal, round, and reactive to light. No scleral icterus.  Neck: Normal range of motion. Neck supple.  Cardiovascular: Normal rate, regular rhythm, normal heart sounds and intact distal pulses.   No murmur heard. Pulmonary/Chest: Effort normal and breath sounds normal. No  respiratory distress. She has no wheezes. She has no rales.       Deep rattley cough  Lymphadenopathy:    She has no cervical adenopathy.  Skin: Skin is warm and dry. No rash noted.      Assessment & Plan:

## 2012-01-21 NOTE — Telephone Encounter (Signed)
Triage Record Num: [id number] Operator: Caswell Corwin Patient Name: [name] Call Date & Time: 01/21/2012 9:17:50AM Patient Phone: [phone] PCP: Patient Gender: Female PCP Fax : Patient DOB: 1941-11-21 Practice Name: [facility] Reason for Call: Caller: Kristalynn/Patient; PCP: Ruthe Mannan (Nestor Ramp); CB#: [phone]; Call regarding Cough/Congestion that started 2 weeks ago. Triaged Cough and afebrile. Pt uses a wood stove and since starting that cough is worse. Using humidifed air and has been taking Robitussin DM with no relief. Has to sleep with head elevated. Needs to be seen in 24 hrs. Appt made with Guiterrez at 1215 today. Protocol(s) Used: Cough - Adult Recommended Outcome per Protocol: See Provider within 24 hours Reason for Outcome: Gradual onset of cough when lying down AND having to sleep on more than one pillow or with head of bed elevated Care Advice: ~ Avoid any activity that produces symptoms until evaluated by provider. ~ Sleep with head elevated on several pillows or in a recliner. Go to ED IMMEDIATELY if developing increased shortness of breath, continuous cough, worsening fatigue, or unable to perform ADLs. ~ Provide for rest periods by spacing activities that require physical exertion. Promote calm, restful environment; anxiety increases breathing difficulty. ~ ~ Call EMS 911 if having chest pain lasting more than 5 minutes, fainting, or rapid or irregular heart beat. Follow your treatment plan as given to you by your provider to manage symptoms, including reducing salt intake, limiting intake of fluids, monitoring daily weights, and exercising as instructed. ~ 01/21/2012 9:38:45AM Page 1 of 1 [email or web address]

## 2012-01-21 NOTE — Telephone Encounter (Signed)
Noted.   Appt for today.

## 2012-01-21 NOTE — Assessment & Plan Note (Signed)
Given duration of sxs will treat with zpack. Update [place] if sxs not improving as expected. cheratussin for cough.  Pt states has tolerated codeine well in past.

## 2012-01-21 NOTE — Patient Instructions (Signed)
I think you have developed bronchitis. Take zpack antibiotic.  May use codeine cough syrup. The cough can last several weeks to go away. May use simple mucinex with plenty of fluid. Push fluids and plenty of rest. Please return if you are not improving as expected, or if you have high fevers (>101.5) or difficulty swallowing or worsening productive cough. Call clinic with questions.  Good to see you today.

## 2012-01-25 ENCOUNTER — Ambulatory Visit: Payer: Medicare Other

## 2012-02-01 ENCOUNTER — Encounter: Payer: Self-pay | Admitting: Family Medicine

## 2012-02-01 ENCOUNTER — Ambulatory Visit: Payer: Medicare Other

## 2012-02-01 ENCOUNTER — Ambulatory Visit (INDEPENDENT_AMBULATORY_CARE_PROVIDER_SITE_OTHER): Payer: Medicare Other | Admitting: Family Medicine

## 2012-02-01 VITALS — BP 136/80 | HR 80 | Temp 97.9°F | Wt 205.2 lb

## 2012-02-01 DIAGNOSIS — H938X9 Other specified disorders of ear, unspecified ear: Secondary | ICD-10-CM

## 2012-02-01 MED ORDER — FLUTICASONE PROPIONATE 50 MCG/ACT NA SUSP: 2.0000 | Freq: Every day | NASAL | Status: DC

## 2012-02-01 MED ORDER — AMOXICILLIN-POT CLAVULANATE 875-125 MG PO TABS: 1.0000 | ORAL_TABLET | Freq: Two times a day (BID) | ORAL | Status: AC

## 2012-02-01 NOTE — Progress Notes (Signed)
  Subjective:    Patient ID: [name], female    DOB: Aug 23, 1941, 71 y.o.   MRN: [id number]  HPI CC: sinusitis?  H/o breast cancer s/p R mastectomy with residual RUE lymphedema.  H/o T2DM, well controlled.  Recent partial dry socket after tooth extraction last week, followed by dentist.  Seen here 01/21/2012 with dx bronchitis, treated with zpack and cheratussin.  Chest some better but still feeling fatigued, tired and ill.  Now right ear feeling muffled, mild ache.  Mild right frontal dull headache.  sxs going on for 3 weeks now.  No fevers/chills, abd pain, PNdrainage.  Also shoulders feeling "heavy and achey".  Review of Systems Per HPI    Objective:   Physical Exam  Nursing note and vitals reviewed. Constitutional: She appears well-developed and well-nourished. No distress.  HENT:  Head: Normocephalic and atraumatic.  Right Ear: Hearing, external ear and ear canal normal.  Left Ear: Hearing, tympanic membrane, external ear and ear canal normal.  Nose: No mucosal edema or rhinorrhea. Right sinus exhibits maxillary sinus tenderness and frontal sinus tenderness. Left sinus exhibits no maxillary sinus tenderness and no frontal sinus tenderness.  Mouth/Throat: Uvula is midline, oropharynx is clear and moist and mucous membranes are normal. No oropharyngeal exudate, posterior oropharyngeal edema, posterior oropharyngeal erythema or tonsillar abscesses.       R TM - retracted , slight erythema, and fluid behind ear.  Eyes: Conjunctivae and EOM are normal. Pupils are equal, round, and reactive to light. No scleral icterus.  Neck: Normal range of motion. Neck supple.  Cardiovascular: Normal rate, regular rhythm, normal heart sounds and intact distal pulses.   No murmur heard. Pulmonary/Chest: Effort normal and breath sounds normal. No respiratory distress. She has no wheezes. She has no rales.  Lymphadenopathy:    She has no cervical adenopathy.  Skin: Skin is warm and dry. No rash  noted.       Assessment & Plan:

## 2012-02-01 NOTE — Assessment & Plan Note (Addendum)
Does have R otitis with effusion.  Treat with flonase and continued supportive care as discussed see pt instructions. ?sinusitis component, provided with WASP script for augmentin to take in case not improving with above.

## 2012-02-01 NOTE — Patient Instructions (Addendum)
I think you have serous otitis - or fluid in the ears. Continue mucinex and nasal saline. Ok to take tylenol. Start flonase two sprays in each nostril daily.  If any nosebleeds, stop. If not improving in 4-5 days, may fill antibiotic (augmentin) to cover any sinusitis component. I hope you start feeling better!  Let us know how your'e doing.  Serous Otitis Media  Serous otitis media is also known as otitis media with effusion (OME). It means there is fluid in the middle ear space. This space contains the bones for hearing and air. Air in the middle ear space helps to transmit sound.  The air gets there through the eustachian tube. This tube goes from the back of the throat to the middle ear space. It keeps the pressure in the middle ear the same as the outside world. It also helps to drain fluid from the middle ear space. CAUSES  OME occurs when the eustachian tube gets blocked. Blockage can come from:  Ear infections.   Colds and other upper respiratory infections.   Allergies.   Irritants such as cigarette smoke.   Sudden changes in air pressure (such as descending in an airplane).   Enlarged adenoids.  During colds and upper respiratory infections, the middle ear space can become temporarily filled with fluid. This can happen after an ear infection also. Once the infection clears, the fluid will generally drain out of the ear through the eustachian tube. If it does not, then OME occurs. SYMPTOMS   Hearing loss.   A feeling of fullness in the ear - but no pain.   Young children may not show any symptoms.  DIAGNOSIS   Diagnosis of OME is made by an ear exam.   Tests may be done to check on the movement of the eardrum.   Hearing exams may be done.  TREATMENT   The fluid most often goes away without treatment.   If allergy is the cause, allergy treatment may be helpful.   Fluid that persists for several months may require minor surgery. A small tube is placed in the ear  drum to:   Drain the fluid.   Restore the air in the middle ear space.   In certain situations, antibiotics are used to avoid surgery.   Surgery may be done to remove enlarged adenoids (if this is the cause).  HOME CARE INSTRUCTIONS   Keep children away from tobacco smoke.   Be sure to keep follow up appointments, if any.  SEEK MEDICAL CARE IF:   Hearing is not better in 3 months.   Hearing is worse.   Ear pain.   Drainage from the ear.   Dizziness.  Document Released: 02/19/2004 Document Revised: 08/11/2011 Document Reviewed: 12/19/2008 [facility] Patient Information 2012 [place], [place].

## 2012-03-16 ENCOUNTER — Other Ambulatory Visit: Payer: Self-pay | Admitting: *Deleted

## 2012-03-16 MED ORDER — INSULIN GLARGINE 100 UNIT/ML ~~LOC~~ SOLN: SUBCUTANEOUS | Status: DC

## 2012-03-27 ENCOUNTER — Other Ambulatory Visit: Payer: Self-pay | Admitting: *Deleted

## 2012-03-27 MED ORDER — METFORMIN HCL 500 MG PO TABS: 500.0000 mg | ORAL_TABLET | Freq: Every day | ORAL | Status: DC

## 2012-04-24 ENCOUNTER — Other Ambulatory Visit: Payer: Self-pay | Admitting: *Deleted

## 2012-04-24 MED ORDER — GLYBURIDE-METFORMIN 5-500 MG PO TABS: 2.0000 | ORAL_TABLET | Freq: Two times a day (BID) | ORAL | Status: DC

## 2012-05-02 ENCOUNTER — Telehealth: Payer: Self-pay | Admitting: Family Medicine

## 2012-05-02 NOTE — Telephone Encounter (Signed)
Caller: [name]/Patient; PCP: Gwinda Passe); CB#: [phone];  Call regarding found Tick on lower back on 04/30/12- red spot and mosquito sized bump where tick removed. Medium sized tick/not tiny like deer tick. Applying alcohol and Neosporin and bite site is still itchy. Afebrile. Triage and Care advice per Bites and Stings Protocol and appnt scheduled for 0945 on 05/03/12.

## 2012-05-03 ENCOUNTER — Ambulatory Visit (INDEPENDENT_AMBULATORY_CARE_PROVIDER_SITE_OTHER): Payer: Medicare Other | Admitting: Family Medicine

## 2012-05-03 ENCOUNTER — Encounter: Payer: Self-pay | Admitting: Family Medicine

## 2012-05-03 VITALS — BP 136/52 | HR 88 | Temp 97.9°F | Wt 203.0 lb

## 2012-05-03 DIAGNOSIS — T148XXA Other injury of unspecified body region, initial encounter: Secondary | ICD-10-CM

## 2012-05-03 DIAGNOSIS — W57XXXA Bitten or stung by nonvenomous insect and other nonvenomous arthropods, initial encounter: Secondary | ICD-10-CM | POA: Insufficient documentation

## 2012-05-03 NOTE — Progress Notes (Signed)
Subjective:    Patient ID: [name], female    DOB: 04-05-1941, 71 y.o.   MRN: [id number]  HPI  71 yo here for tick bite.  73 days ago, felt a bump on her lower back, husband removed a tick. It was not engorged and he felt he removed the entire tick. No fevers, chills, HA or blurred vision since that time.  Husband noticed a small area of redness where tick was and wanted her to be evaluated.  No drainage.  Patient Active Problem List  Diagnoses  . CARCINOMA, BREAST, RIGHT  . HYPOTHYROIDISM  . DIABETES MELLITUS, TYPE II  . HYPERTENSION  . HIATAL HERNIA  . SHOULDER PAIN, RIGHT  . ALLERGY, ENVIRONMENTAL  . HEEL PAIN, RIGHT  . Routine general medical examination at a health care facility  . Routine general medical examination at a health care facility  . Bronchitis  . Ear congestion  . Tick bite   Past Medical History  Diagnosis Date  . Diabetes mellitus   . Hypertension   . Thyroid disease   . Osteoporosis    Past Surgical History  Procedure Date  . Mumps 1965    h/o  . Miscarriage 1970  . Septoplasty 1970  . Appendectomy   . Vaginal hysterectomy 1974    partial, fibroids  . Tonsillectomy 1945  . Foot surgery 1992 & 1994    bilateral  . Back surgery 1983    discectomy  . Ventral hernia repair   . Mastectomy     right, modified radical+ chemo, 2/23 nodes 4/03  . Colonoscopy   . Vaginal delivery     x5  . Other surgical history 04/03/02    dexa, wnl  . Other surgical history 03/12/04    dexa, wnl  . Doppler echocardiography 02/24/06    Triv MR, EF 55-60%  . Other surgical history 08/12/05    dexa-osteopor Hip, osteopen spine  . 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 Other surgical history 10/25/07    dexa- nml hip and spine   History  Substance Use Topics  . Smoking status: Never Smoker   . Smokeless tobacco: Not on file  . Alcohol Use: Not on file   Family History  Problem Relation Age of Onset  . Heart failure Father   .  Hypertension Father   . Stroke Father   . Lung cancer Brother     hx of smoking  . Diabetes Mother   . Hypertension Mother   . Heart attack Mother   . Stroke Brother   . Heart attack Brother     PTCA MI x 2   Allergies  Allergen Reactions  . Hydrocodone-Homatropine     REACTION: DROVE HER UP THE WALL  . Rosiglitazone Maleate     REACTION: SWELLING   Current Outpatient Prescriptions on File Prior to Visit  Medication Sig Dispense Refill  . aspirin 81 MG tablet Take 81 mg by mouth daily.        . Blood Glucose Monitoring Suppl (ACCU-CHEK AVIVA PLUS) W/DEVICE KIT Use to check blood sugar twice daily       . Calcium Citrate (CITRACAL PO) Take 1 tablet by mouth daily.        Marland Kitchen dimenhyDRINATE (DRAMAMINE) 50 MG tablet Take 50 mg by mouth as needed.        . diphenhydramine-acetaminophen (TYLENOL PM) 25-500 MG TABS Take 1/2 tablet by mouth at bedtime as needed       .  ezetimibe (ZETIA) 10 MG tablet Take 1 tablet (10 mg total) by mouth daily.  30 tablet  11  . glucose blood test strip (Accu-chek compact) Use as instructed  100 each  6  . glyBURIDE-metformin (GLUCOVANCE) 5-500 MG per tablet Take 2 tablets by mouth 2 (two) times daily.  120 tablet  3  . guaiFENesin (MUCINEX) 600 MG 12 hr tablet as needed.        . insulin glargine (LANTUS) 100 UNIT/ML injection Inject 25 units and as needed on a sliding scale  15 mL  11  . Insulin Pen Needle (B-D UF III MINI PEN NEEDLES) 31G X 5 MM MISC Use as directed  100 each  6  . Lancets (ACCU-CHEK MULTICLIX) lancets Use as instructed       . meclizine (ANTIVERT) 12.5 MG tablet Take 1 tablet (12.5 mg total) by mouth 3 (three) times daily as needed for dizziness or nausea.  30 tablet  1  . metFORMIN (GLUCOPHAGE) 500 MG tablet Take 1 tablet (500 mg total) by mouth daily.  30 tablet  6  . Multiple Vitamins-Minerals (CENTRUM SILVER PO) Take 1 tablet by mouth daily.        . valsartan-hydrochlorothiazide (DIOVAN-HCT) 160-25 MG per tablet Take 1 tablet by  mouth daily.  30 tablet  7  . vitamin C (ASCORBIC ACID) 500 MG tablet Take 500 mg by mouth 2 (two) times daily.         The PMH, PSH, Social History, Family History, Medications, and allergies have been reviewed in [facility], and have been updated if relevant.  Review of Systems    No other rashes No malaise  No photophobia Objective:   Physical Exam BP 136/52  Pulse 88  Temp 97.9 F (36.6 C)  Wt 203 lb (92.08 kg)  General:  Well-developed,well-nourished,in no acute distress; alert,appropriate and cooperative throughout examination Head:  normocephalic and atraumatic.   Neurologic:  alert & oriented X3 and gait normal.   Skin:  1 cm raised erythema on lower back, no other rashes. Psych:  Cognition and judgment appear intact. Alert and cooperative with normal attention span and concentration. No apparent delusions, illusions, hallucinations      Assessment & Plan:   1. Tick bite    New- appears to be local reaction. No indication of tick borne illness. Red flags given to pt requiring follow up- see pt instructions for details. The patient indicates understanding of these issues and agrees with the plan.

## 2012-05-03 NOTE — Patient Instructions (Signed)
Deer Tick Bite Deer ticks are brown arachnids (spider family) that vary in size from as small as the head of a pin to 1/4 inch (1/2 cm) diameter. They thrive in wooded areas. Deer are the preferred host of adult deer ticks. Small rodents are the host of young ticks (nymphs). When a person walks in a field or wooded area, young and adult ticks in the surrounding grass and vegetation can attach themselves to the skin. They can suck blood for hours to days if unnoticed. Ticks are found all over the [place] Some ticks carry a specific bacteria (Borrelia burgdorferi) that causes an infection called Lyme disease. The bacteria is typically passed into a person during the blood sucking process. This happens after the tick has been attached for at least a number of hours. While ticks can be found all over the [place] those carrying the bacteria that causes Lyme disease are most common in [place]. Only a small proportion of ticks in these areas carry the Lyme disease bacteria and cause human infections. Ticks usually attach to warm spots on the body, such as the:  Head.   Back.   Neck.   Armpits.   Groin.  SYMPTOMS  Most of the time, a deer tick bite will not be felt. You may or may not see the attached tick. You may notice mild irritation or redness around the bite site. If the deer tick passes the Lyme disease bacteria to a person, a round, red rash may be noticed 2 to 3 days after the bite. The rash may be clear in the middle, like a bull's-eye or target. If not treated, other symptoms may develop several days to weeks after the onset of the rash. These symptoms may include:  New rash lesions.   Fatigue and weakness.   General ill feeling and achiness.   Chills.   Headache and neck pain.   Swollen lymph glands.   Sore muscles and joints.  5 to 15% of untreated people with Lyme disease may develop more severe illnesses after several weeks to months. This may include inflammation  of the brain lining (meningitis), nerve palsies, an abnormal heartbeat, or severe muscle and joint pain and inflammation (myositis or arthritis). DIAGNOSIS   Physical exam and medical history.   Viewing the tick if it was saved for confirmation.   Blood tests (to check or confirm the presence of Lyme disease).  TREATMENT  Most ticks do not carry disease. If found, an attached tick should be removed using tweezers. Tweezers should be placed under the body of the tick so it is removed by its attachment parts (pincers). If there are signs or symptoms of being sick, or Lyme disease is confirmed, medicines (antibiotics) that kill germs are usually prescribed. In more severe cases, antibiotics may be given through an intravenous (IV) access. HOME CARE INSTRUCTIONS   Always remove ticks with tweezers. Do not use petroleum jelly or other methods to kill or remove the tick. Slide the tweezers under the body and pull out as much as you can. If you are not sure what it is, save it in a jar and show your caregiver.   Once you remove the tick, the skin will heal on its own. Wash your hands and the affected area with water and soap. You may place a bandage on the affected area.   Take medicine as directed. You may be advised to take a full course of antibiotics.   Follow up  be advised to take a full course of antibiotics.   Follow up with your caregiver as recommended.  FINDING OUT THE RESULTS OF YOUR TEST  Not all test results are available during your visit. If your test results are not back during the visit, make an appointment with your caregiver to find out the results. Do not assume everything is normal if you have not heard from your caregiver or the medical facility. It is important for you to follow up on all of your test results.  PROGNOSIS   If Lyme disease is confirmed, early treatment with antibiotics is very effective. Following preventive guidelines is important since it is possible to get the disease more than once.  PREVENTION    Wear long sleeves and long pants in  wooded or grassy areas. Tuck your pants into your socks.   Use an insect repellent while hiking.   Check yourself, your children, and your pets regularly for ticks after playing outside.   Clear piles of leaves or brush from your yard. Ticks might live there.  SEEK MEDICAL CARE IF:    You or your child has an oral temperature above 102 F (38.9 C).   You develop a severe headache following the bite.   You feel generally ill.   You notice a rash.   You are having trouble removing the tick.   The bite area has red skin or yellow drainage.  SEEK IMMEDIATE MEDICAL CARE IF:    Your face is weak and droopy or you have other neurological symptoms.   You have severe joint pain or weakness.  MAKE SURE YOU:    Understand these instructions.   Will watch your condition.   Will get help right away if you are not doing well or get worse.  FOR MORE INFORMATION  Centers for Disease Control and Prevention: www.cdc.gov  American Academy of Family Physicians: www.aafp.org  Document Released: 02/23/2010 Document Revised: 11/18/2011 Document Reviewed: 02/23/2010  [employer] Patient Information 2012 [employer].

## 2012-05-23 ENCOUNTER — Other Ambulatory Visit: Payer: Self-pay | Admitting: *Deleted

## 2012-05-23 MED ORDER — EZETIMIBE 10 MG PO TABS: 10.0000 mg | ORAL_TABLET | Freq: Every day | ORAL | Status: DC

## 2012-05-31 ENCOUNTER — Other Ambulatory Visit: Payer: Self-pay | Admitting: *Deleted

## 2012-05-31 MED ORDER — VALSARTAN-HYDROCHLOROTHIAZIDE 160-25 MG PO TABS: 1.0000 | ORAL_TABLET | Freq: Every day | ORAL | Status: DC

## 2012-06-21 ENCOUNTER — Other Ambulatory Visit: Payer: Self-pay | Admitting: Family Medicine

## 2012-06-21 DIAGNOSIS — Z136 Encounter for screening for cardiovascular disorders: Secondary | ICD-10-CM

## 2012-06-21 DIAGNOSIS — E039 Hypothyroidism, unspecified: Secondary | ICD-10-CM

## 2012-06-21 DIAGNOSIS — E119 Type 2 diabetes mellitus without complications: Secondary | ICD-10-CM

## 2012-06-21 DIAGNOSIS — Z0000 Encounter for general adult medical examination without abnormal findings: Secondary | ICD-10-CM

## 2012-06-27 ENCOUNTER — Other Ambulatory Visit (INDEPENDENT_AMBULATORY_CARE_PROVIDER_SITE_OTHER): Payer: Medicare Other

## 2012-06-27 ENCOUNTER — Telehealth: Payer: Self-pay

## 2012-06-27 DIAGNOSIS — Z136 Encounter for screening for cardiovascular disorders: Secondary | ICD-10-CM

## 2012-06-27 DIAGNOSIS — Z0000 Encounter for general adult medical examination without abnormal findings: Secondary | ICD-10-CM

## 2012-06-27 DIAGNOSIS — E119 Type 2 diabetes mellitus without complications: Secondary | ICD-10-CM

## 2012-06-27 DIAGNOSIS — E039 Hypothyroidism, unspecified: Secondary | ICD-10-CM

## 2012-06-27 LAB — COMPREHENSIVE METABOLIC PANEL
Albumin: 3.8 g/dL (ref 3.5–5.2)
Alkaline Phosphatase: 44 U/L (ref 39–117)
BUN: 16 mg/dL (ref 6–23)
CO2: 26 mEq/L (ref 19–32)
Calcium: 9.1 mg/dL (ref 8.4–10.5)
GFR: 88.61 mL/min (ref >60.00)
Glucose, Bld: 91 mg/dL (ref 70–99)
Potassium: 3.9 mEq/L (ref 3.5–5.1)
Total Protein: 7.1 g/dL (ref 6.0–8.3)

## 2012-06-27 LAB — LIPID PANEL
Cholesterol: 180 mg/dL (ref 0–200)
VLDL: 29 mg/dL (ref 0.0–40.0)

## 2012-06-27 LAB — HEMOGLOBIN A1C: Hgb A1c MFr Bld: 8.1 % — ABNORMAL HIGH (ref 4.6–6.5)

## 2012-06-27 LAB — TSH: TSH: 2.63 u[IU]/mL (ref 0.35–5.50)

## 2012-06-27 NOTE — Telephone Encounter (Signed)
Pt came in for lab prior to CPX 07/04/12. Pt has dizziness on and off since 06/22/12, Meclizine helps. Pt also having perineal irritation and itching with vaginal itching also; pt has used monistat 7 day with no relief;no vaginal discharge; pt has no burning or pain upon urination, no abdominal or back pain but does have frequency of urine. Pt left urine specimen in lab if Dr Dayton Martes wants U/A checked. Pt does not want to be seen prior to CPX on 07/04/12; pt said if condition changed or worsened will call back. [place].Please advise if needs U/A.

## 2012-06-27 NOTE — Telephone Encounter (Signed)
Unfortunately urine was thrown out.  Advised patient of this.  She will try to come in on Thursday to leave another sample.

## 2012-06-27 NOTE — Telephone Encounter (Signed)
Noted! Thank you

## 2012-06-27 NOTE — Telephone Encounter (Signed)
Yes ok to check UA.

## 2012-06-29 NOTE — Telephone Encounter (Signed)
Noted! Thank you

## 2012-06-29 NOTE — Telephone Encounter (Signed)
Pt came back in today and left urine sample, clear, advised patient.

## 2012-07-04 ENCOUNTER — Ambulatory Visit (INDEPENDENT_AMBULATORY_CARE_PROVIDER_SITE_OTHER): Payer: Medicare Other | Admitting: Family Medicine

## 2012-07-04 ENCOUNTER — Encounter: Payer: Self-pay | Admitting: Family Medicine

## 2012-07-04 VITALS — BP 144/70 | HR 88 | Temp 97.9°F | Ht 66.0 in | Wt 201.0 lb

## 2012-07-04 DIAGNOSIS — Z23 Encounter for immunization: Secondary | ICD-10-CM

## 2012-07-04 DIAGNOSIS — Z2911 Encounter for prophylactic immunotherapy for respiratory syncytial virus (RSV): Secondary | ICD-10-CM

## 2012-07-04 DIAGNOSIS — R1310 Dysphagia, unspecified: Secondary | ICD-10-CM

## 2012-07-04 DIAGNOSIS — N760 Acute vaginitis: Secondary | ICD-10-CM

## 2012-07-04 DIAGNOSIS — Z0000 Encounter for general adult medical examination without abnormal findings: Secondary | ICD-10-CM

## 2012-07-04 DIAGNOSIS — Z780 Asymptomatic menopausal state: Secondary | ICD-10-CM

## 2012-07-04 MED ORDER — NYSTATIN 100000 UNIT/GM EX POWD: CUTANEOUS | Status: AC

## 2012-07-04 MED ORDER — FLUCONAZOLE 150 MG PO TABS: 150.0000 mg | ORAL_TABLET | Freq: Once | ORAL | Status: AC

## 2012-07-04 NOTE — Progress Notes (Signed)
Here for Medicare AWV:  71 yo here for annual medicare wellness visit with multiple concerns. She is very unpleased that [employer] sent out a letter stating what was and was not covered in AMW.  I have personally reviewed the Medicare Annual Wellness questionnaire and have noted 1. The patient's medical and social history 2. Their use of alcohol, tobacco or illicit drugs 3. Their current medications and supplements 4. The patient's functional ability including ADL's, fall risks, home safety risks and hearing or visual             impairment. 5. Diet and physical activities 6. Evidence for depression or mood disorders  DM- On Glucovance 5-500 two tabs two times a day, Metformin 500 mg daily, Lantus 26 units daily.  States that CBGs running between 99-166.  Had one episode of hypoglyemia several weeks.  a1c has gone up.  Lab Results  Component Value Date   HGBA1C 8.1* 06/27/2012    H/o breast CA- gets yearly mammograms.  BP- on Diovan- HCT. BP has been well controlled.  Dysphagia- past several months, difficulty swallowing solid foods.  Usually occurs 3 or so times a week. She is a non smoker.  Does feel symptoms are progressing.  Dr. Mechele Collin performed her recent coloscopy.  Vaginitis- recurrent- try nystatin powder at last OV- symptoms resolved. They have now returned and improved a little with OTC monistat (ran out of powder). Still has vulvular itching and burning. UA neg. Patient Active Problem List  Diagnosis  . CARCINOMA, BREAST, RIGHT  . HYPOTHYROIDISM  . DIABETES MELLITUS, TYPE II  . HYPERTENSION  . HIATAL HERNIA  . SHOULDER PAIN, RIGHT  . ALLERGY, ENVIRONMENTAL  . HEEL PAIN, RIGHT  . Routine general medical examination at a health care facility  . Routine general medical examination at a health care facility  . Bronchitis  . Ear congestion  . Tick bite   Past Medical History  Diagnosis Date  . Diabetes mellitus   . Hypertension   . Thyroid disease   .  Osteoporosis    Past Surgical History  Procedure Date  . Mumps 1965    h/o  . Miscarriage 1970  . Septoplasty 1970  . Appendectomy   . Vaginal hysterectomy 1974    partial, fibroids  . Tonsillectomy 1945  . Foot surgery 1992 & 1994    bilateral  . Back surgery 1983    discectomy  . Ventral hernia repair   . Mastectomy     right, modified radical+ chemo, 2/23 nodes 4/03  . Colonoscopy   . Vaginal delivery     x5  . Other surgical history 04/03/02    dexa, wnl  . Other surgical history 03/12/04    dexa, wnl  . Doppler echocardiography 02/24/06    Triv MR, EF 55-60%  . Other surgical history 08/12/05    dexa-osteopor Hip, osteopen spine  . 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 Other surgical history 10/25/07    dexa- nml hip and spine   History  Substance Use Topics  . Smoking status: Never Smoker   . Smokeless tobacco: Not on file  . Alcohol Use: Not on file   Family History  Problem Relation Age of Onset  . Heart failure Father   . Hypertension Father   . Stroke Father   . Lung cancer Brother     hx of smoking  . Diabetes Mother   . Hypertension Mother   . Heart attack Mother   .  Stroke Brother   . Heart attack Brother     PTCA MI x 2   Allergies  Allergen Reactions  . Hydrocodone-Homatropine     REACTION: DROVE HER UP THE WALL  . Rosiglitazone Maleate     REACTION: SWELLING   Current Outpatient Prescriptions on File Prior to Visit  Medication Sig Dispense Refill  . aspirin 81 MG tablet Take 81 mg by mouth daily.        . Blood Glucose Monitoring Suppl (ACCU-CHEK AVIVA PLUS) W/DEVICE KIT Use to check blood sugar twice daily       . Calcium Citrate (CITRACAL PO) Take 1 tablet by mouth daily.        Marland Kitchen dimenhyDRINATE (DRAMAMINE) 50 MG tablet Take 50 mg by mouth as needed.        . diphenhydramine-acetaminophen (TYLENOL PM) 25-500 MG TABS Take 1/2 tablet by mouth at bedtime as needed       . ezetimibe (ZETIA) 10 MG tablet Take 1 tablet (10 mg  total) by mouth daily.  30 tablet  1  . glucose blood test strip (Accu-chek compact) Use as instructed  100 each  6  . glyBURIDE-metformin (GLUCOVANCE) 5-500 MG per tablet Take 2 tablets by mouth 2 (two) times daily.  120 tablet  3  . guaiFENesin (MUCINEX) 600 MG 12 hr tablet as needed.        . insulin glargine (LANTUS) 100 UNIT/ML injection Inject 25 units and as needed on a sliding scale  15 mL  11  . Insulin Pen Needle (B-D UF III MINI PEN NEEDLES) 31G X 5 MM MISC Use as directed  100 each  6  . Lancets (ACCU-CHEK MULTICLIX) lancets Use as instructed       . meclizine (ANTIVERT) 12.5 MG tablet Take 1 tablet (12.5 mg total) by mouth 3 (three) times daily as needed for dizziness or nausea.  30 tablet  1  . metFORMIN (GLUCOPHAGE) 500 MG tablet Take 1 tablet (500 mg total) by mouth daily.  30 tablet  6  . Multiple Vitamins-Minerals (CENTRUM SILVER PO) Take 1 tablet by mouth daily.        . valsartan-hydrochlorothiazide (DIOVAN-HCT) 160-25 MG per tablet Take 1 tablet by mouth daily.  30 tablet  2  . vitamin C (ASCORBIC ACID) 500 MG tablet Take 500 mg by mouth 2 (two) times daily.           The PMH, PSH, Social History, Family History, Medications, and allergies have been reviewed in [facility], and have been updated if relevant.   Review of Systems       See HPI Patient reports no  vision/ hearing changes,anorexia, weight change, fever ,adenopathy, persistant / recurrent hoarseness, swallowing issues, chest pain, edema,persistant / recurrent cough, hemoptysis, dyspnea(rest, exertional, paroxysmal nocturnal), gastrointestinal  bleeding (melena, rectal bleeding), abdominal pain, excessive heart burn, GU symptoms(dysuria, hematuria, pyuria, voiding/incontinence  Issues) syncope, focal weakness, severe memory loss, concerning skin lesions, depression, anxiety, abnormal bruising/bleeding, major joint swelling, breast masses or abnormal vaginal bleeding.     Physical Exam BP 144/70  Pulse 88  Temp 97.9  F (36.6 C)  Ht 5\' 6"  (1.676 m)  Wt 201 lb (91.173 kg)  BMI 32.44 kg/m2  General:  Well-developed,well-nourished,in no acute distress; alert,appropriate and cooperative throughout examination, mildly overweight. Head:  Normocephalic and atraumatic without obvious abnormalities. Wears wig due to past chemo. Eyes:  Conjunctiva clear bilaterally.  Ears:  External ear exam shows no significant lesions or deformities.  Otoscopic examination  reveals clear canals, tympanic membranes are intact bilaterally without bulging, retraction, inflammation or discharge. Hearing is grossly normal bilaterally. Nose:  External nasal examination shows no deformity or inflammation. Nasal mucosa are pink and moist without lesions or exudates. Mouth:  Oral mucosa and oropharynx without lesions or exudates.  Teeth in good repair. Neck:  No deformities, masses, or tenderness noted. Lungs:  Normal respiratory effort, chest expands symmetrically. Lungs are clear to auscultation, no crackles or wheezes. Heart:  Normal rate and regular rhythm. S1 and S2 normal without gallop, murmur, click, rub or other extra sounds. Abdomen:  Bowel sounds positive,abdomen soft and non-tender without masses, organomegaly or hernias noted. Msk:  right 2nd toe- visible absncess on toe, open, draining, small area of warmth and erythema.   Extremities:  No clubbing, cyanosis, edema, or deformity noted with normal full range of motion of all joints.  R arm with chronic lymphedema. Neurologic:  No cranial nerve deficits noted. Station and gait are normal. Plantar reflexes are down-going bilaterally. DTRs are symmetrical throughout. Sensory, motor and coordinative functions appear intact. Psych:  Cognition and judgment appear intact. Alert and cooperative with normal attention span and concentration. No apparent delusions, illusions, hallucinations GU: Normal introitus for age, does have some vulvular erythema with satellite lesions, 2 firm cystic  lesions  Rectal:  No hemorrhoids or other lesions  Assessment and Plan: 1. Routine general medical examination at a health care facility  The patients weight, height, BMI and visual acuity have been recorded in the chart I have made referrals, counseling and provided education to the patient based review of the above and I have provided the pt with a written personalized care plan for preventive services.  Varicella-zoster vaccine subcutaneous DG Bone Density  2. Dysphagia  New- ? Esophageal stricture. Refer to GI for endoscopy. The patient indicates understanding of these issues and agrees with the plan.   Ambulatory referral to Gastroenterology  3. Vaginitis  Recurrent- Diflucan 150 mg po x 1, refilled nystatin powder. Refer to GYN for further work up. The patient indicates understanding of these issues and agrees with the plan.  Ambulatory referral to Gynecology

## 2012-07-04 NOTE — Patient Instructions (Addendum)
Let's increase your Lantus to 27 or 28 units nightly. Please take your diflucan 150 mg tablet once and you may use the powder. Please stop by to [facility] on your way out= set up your GI appt and GYN appt.

## 2012-07-17 ENCOUNTER — Other Ambulatory Visit: Payer: Self-pay | Admitting: *Deleted

## 2012-07-17 MED ORDER — EZETIMIBE 10 MG PO TABS: 10.0000 mg | ORAL_TABLET | Freq: Every day | ORAL | Status: DC

## 2012-08-21 ENCOUNTER — Other Ambulatory Visit: Payer: Self-pay | Admitting: *Deleted

## 2012-08-21 MED ORDER — GLYBURIDE-METFORMIN 5-500 MG PO TABS: 2.0000 | ORAL_TABLET | Freq: Two times a day (BID) | ORAL | Status: DC

## 2012-08-22 ENCOUNTER — Other Ambulatory Visit: Payer: Medicare Other

## 2012-08-22 ENCOUNTER — Ambulatory Visit (INDEPENDENT_AMBULATORY_CARE_PROVIDER_SITE_OTHER)
Admission: RE | Admit: 2012-08-22 | Discharge: 2012-08-22 | Disposition: A | Discharge status: Home / Self Care | Payer: Medicare Other | Source: Ambulatory Visit | Attending: Family Medicine | Admitting: Family Medicine

## 2012-08-22 DIAGNOSIS — Z780 Asymptomatic menopausal state: Secondary | ICD-10-CM

## 2012-09-04 ENCOUNTER — Other Ambulatory Visit: Payer: Self-pay | Admitting: Family Medicine

## 2012-09-04 DIAGNOSIS — R937 Abnormal findings on diagnostic imaging of other parts of musculoskeletal system: Secondary | ICD-10-CM

## 2012-09-05 ENCOUNTER — Other Ambulatory Visit: Payer: Self-pay | Admitting: *Deleted

## 2012-09-05 MED ORDER — VALSARTAN-HYDROCHLOROTHIAZIDE 160-25 MG PO TABS: 1.0000 | ORAL_TABLET | Freq: Every day | ORAL | Status: DC

## 2012-09-07 ENCOUNTER — Telehealth: Payer: Self-pay | Admitting: Family Medicine

## 2012-09-07 NOTE — Telephone Encounter (Signed)
Caller: [place]/Patient; Patient Name: Everlene Farrier; PCP: Ruthe Mannan [facility]); Best Callback Phone Number: [phone]; Reason for call: Cough/Congestion. She states onset of chest cold onset Tuesday 09/05/12 , treating with Mucinex.  Now has developed green drainage, with breathing slightly tight and short of breath with exertion. No fever, mild sore throat, Pressure in head, body aches.  Voice is clear.  Emergent s/sx ruled out per Upper respiratory Infection Protocol with exception to "productive cough with colored sputum" See Provider in 24 hours. Appointment scheduled Friday, 09/08/12 at 10:30 with Dr. Dayton Martes for evaluation. Home care reviewed. Understanding expressed.

## 2012-09-08 ENCOUNTER — Ambulatory Visit (INDEPENDENT_AMBULATORY_CARE_PROVIDER_SITE_OTHER): Payer: Medicare Other | Admitting: Family Medicine

## 2012-09-08 ENCOUNTER — Encounter: Payer: Self-pay | Admitting: Family Medicine

## 2012-09-08 ENCOUNTER — Ambulatory Visit (INDEPENDENT_AMBULATORY_CARE_PROVIDER_SITE_OTHER)
Admission: RE | Admit: 2012-09-08 | Discharge: 2012-09-08 | Disposition: A | Discharge status: Home / Self Care | Payer: Medicare Other | Source: Ambulatory Visit | Attending: Family Medicine | Admitting: Family Medicine

## 2012-09-08 VITALS — BP 130/64 | HR 80 | Temp 98.4°F | Resp 20 | Ht 66.0 in | Wt 203.5 lb

## 2012-09-08 DIAGNOSIS — R937 Abnormal findings on diagnostic imaging of other parts of musculoskeletal system: Secondary | ICD-10-CM

## 2012-09-08 DIAGNOSIS — R948 Abnormal results of function studies of other organs and systems: Secondary | ICD-10-CM

## 2012-09-08 DIAGNOSIS — J069 Acute upper respiratory infection, unspecified: Secondary | ICD-10-CM

## 2012-09-08 MED ORDER — AZITHROMYCIN 250 MG PO TABS: ORAL_TABLET | ORAL | Status: AC

## 2012-09-08 NOTE — Progress Notes (Signed)
Subjective:    Patient ID: [name], female    DOB: [birth year], 71 y.o.   MRN: [id number]  HPI Very pleasant 71 yo female with h/o breast cancer s/p R mastectomy with residual RUE lymphedema, well controlled DM2, here for URI.  Recently returned from the beach.  Was sitting under an airconditioner vent for several days, reading.  Since she returned, increased productive cough, fatigue. No fevers/chills, abd pain, n/v, ear pain, ST.  Taking Mucinex with some relief.  Son in law smokes outside.  No h/o asthma, COPD.  Patient Active Problem List  Diagnosis  . CARCINOMA, BREAST, RIGHT  . HYPOTHYROIDISM  . DIABETES MELLITUS, TYPE II  . HYPERTENSION  . HIATAL HERNIA  . SHOULDER PAIN, RIGHT  . ALLERGY, ENVIRONMENTAL  . HEEL PAIN, RIGHT  . Routine general medical examination at a health care facility  . Routine general medical examination at a health care facility  . Bronchitis  . Ear congestion  . Tick bite  . Dysphagia   Past Medical History  Diagnosis Date  . Diabetes mellitus   . Hypertension   . Thyroid disease   . Osteoporosis    Past Surgical History  Procedure Date  . Mumps 1965    h/o  . Miscarriage 1970  . Septoplasty 1970  . Appendectomy   . Vaginal hysterectomy 1974    partial, fibroids  . Tonsillectomy 1945  . Foot surgery 1992 & 1994    bilateral  . Back surgery 1983    discectomy  . Ventral hernia repair   . Mastectomy     right, modified radical+ chemo, 2/23 nodes 4/03  . Colonoscopy   . Vaginal delivery     x5  . Other surgical history 04/03/02    dexa, wnl  . Other surgical history 03/12/04    dexa, wnl  . Doppler echocardiography 02/24/06    Triv MR, EF 55-60%  . Other surgical history 08/12/05    dexa-osteopor Hip, osteopen spine  . 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 Other surgical history 10/25/07    dexa- nml hip and spine   History  Substance Use Topics  . Smoking status: Never Smoker   . Smokeless tobacco: Not  on file  . Alcohol Use: Not on file   Family History  Problem Relation Age of Onset  . Heart failure Father   . Hypertension Father   . Stroke Father   . Lung cancer Brother     hx of smoking  . Diabetes Mother   . Hypertension Mother   . Heart attack Mother   . Stroke Brother   . Heart attack Brother     PTCA MI x 2   Allergies  Allergen Reactions  . Hydrocodone-Homatropine     REACTION: DROVE HER UP THE WALL  . Rosiglitazone Maleate     REACTION: SWELLING   Current Outpatient Prescriptions on File Prior to Visit  Medication Sig Dispense Refill  . aspirin 81 MG tablet Take 81 mg by mouth daily.        . Blood Glucose Monitoring Suppl (ACCU-CHEK AVIVA PLUS) W/DEVICE KIT Use to check blood sugar twice daily       . diphenhydramine-acetaminophen (TYLENOL PM) 25-500 MG TABS Take 1/2 tablet by mouth at bedtime as needed       . ezetimibe (ZETIA) 10 MG tablet Take 1 tablet (10 mg total) by mouth daily.  30 tablet  11  . glucose blood test strip (  Accu-chek compact) Use as instructed  100 each  6  . glyBURIDE-metformin (GLUCOVANCE) 5-500 MG per tablet Take 2 tablets by mouth 2 (two) times daily.  120 tablet  11  . guaiFENesin (MUCINEX) 600 MG 12 hr tablet as needed.        . insulin glargine (LANTUS) 100 UNIT/ML injection Inject 25 units and as needed on a sliding scale  15 mL  11  . Insulin Pen Needle (B-D UF III MINI PEN NEEDLES) 31G X 5 MM MISC Use as directed  100 each  6  . Lancets (ACCU-CHEK MULTICLIX) lancets Use as instructed       . metFORMIN (GLUCOPHAGE) 500 MG tablet Take 1 tablet (500 mg total) by mouth daily.  30 tablet  6  . Multiple Vitamins-Minerals (CENTRUM SILVER PO) Take 1 tablet by mouth daily.        Marland Kitchen nystatin (MYCOSTATIN) powder Apply to affected area twice daily as needed.  15 g  0  . valsartan-hydrochlorothiazide (DIOVAN-HCT) 160-25 MG per tablet Take 1 tablet by mouth daily.  30 tablet  10  . vitamin C (ASCORBIC ACID) 500 MG tablet Take 500 mg by mouth 2  (two) times daily.         The PMH, PSH, Social History, Family History, Medications, and allergies have been reviewed in [facility], and have been updated if relevant.   Review of Systems Per HPI    Objective:   Physical Exam  BP 130/64  Pulse 80  Temp 98.4 F (36.9 C) (Oral)  Resp 20  Ht 5\' 6"  (1.676 m)  Wt 203 lb 8 oz (92.307 kg)  BMI 32.85 kg/m2  SpO2 99%  Constitutional: She appears well-developed and well-nourished. No distress.  HENT:  Head: Normocephalic and atraumatic.  Right Ear: Hearing, tympanic membrane, external ear and ear canal normal.  Left Ear: Hearing, tympanic membrane, external ear and ear canal normal.  Nose: Nose normal. No mucosal edema or rhinorrhea. Right sinus exhibits no maxillary sinus tenderness and no frontal sinus tenderness. Left sinus exhibits no maxillary sinus tenderness and no frontal sinus tenderness.  Mouth/Throat: Uvula is midline, oropharynx is clear and moist and mucous membranes are normal. No oropharyngeal exudate, posterior oropharyngeal edema, posterior oropharyngeal erythema or tonsillar abscesses.  Eyes: Conjunctivae and EOM are normal. Pupils are equal, round, and reactive to light. No scleral icterus.  Neck: Normal range of motion. Neck supple.  Cardiovascular: Normal rate, regular rhythm, normal heart sounds and intact distal pulses.   No murmur heard. Pulmonary/Chest: Effort normal and breath sounds normal. No respiratory distress. She has no wheezes. She has no rales.       Deep rattley cough, scattered faint wheezes LLL  Lymphadenopathy:    She has no cervical adenopathy.  Skin: Skin is warm and dry. No rash noted.      Assessment & Plan:  1. URI- Most likely viral. Given rx for Zpack to fill if symptoms progress. Continue supportive care as per pt instructions. The patient indicates understanding of these issues and agrees with the plan.

## 2012-09-08 NOTE — Patient Instructions (Addendum)
Good to see you. Please continue Mucinex as directed. If your symptoms worsen, please start the Zpack. Call us if symptoms worsen or don't improve over next 5-7 days.

## 2012-09-12 ENCOUNTER — Other Ambulatory Visit: Payer: Self-pay | Admitting: *Deleted

## 2012-09-12 MED ORDER — INSULIN PEN NEEDLE 32G X 4 MM MISC: Status: DC

## 2012-09-28 ENCOUNTER — Ambulatory Visit: Payer: Self-pay | Admitting: Unknown Physician Specialty

## 2012-10-02 LAB — PATHOLOGY REPORT

## 2012-10-30 ENCOUNTER — Other Ambulatory Visit: Payer: Self-pay | Admitting: *Deleted

## 2012-10-30 MED ORDER — METFORMIN HCL 500 MG PO TABS: 500.0000 mg | ORAL_TABLET | Freq: Every day | ORAL | Status: DC

## 2012-11-20 ENCOUNTER — Ambulatory Visit: Payer: Self-pay | Admitting: Oncology

## 2013-01-02 ENCOUNTER — Ambulatory Visit: Payer: Self-pay | Admitting: Oncology

## 2013-01-13 ENCOUNTER — Ambulatory Visit: Payer: Self-pay | Admitting: Oncology

## 2013-01-15 LAB — CBC CANCER CENTER
Basophil #: 0.1 x10 3/mm (ref 0.0–0.1)
Basophil %: 0.9 %
Eosinophil #: 0.1 x10 3/mm (ref 0.0–0.7)
HCT: 38.6 % (ref 35.0–47.0)
Lymphocyte %: 14.6 %
MCH: 28.5 pg (ref 26.0–34.0)
MCHC: 33.1 g/dL (ref 32.0–36.0)
MCV: 86 fL (ref 80–100)
Monocyte #: 0.7 x10 3/mm (ref 0.2–0.9)
Monocyte %: 7 %
RBC: 4.49 10*6/uL (ref 3.80–5.20)
RDW: 13.1 % (ref 11.5–14.5)

## 2013-01-15 LAB — COMPREHENSIVE METABOLIC PANEL
Alkaline Phosphatase: 61 U/L (ref 50–136)
BUN: 17 mg/dL (ref 7–18)
Bilirubin,Total: 0.4 mg/dL (ref 0.2–1.0)
Calcium, Total: 8.8 mg/dL (ref 8.5–10.1)
Chloride: 94 mmol/L — ABNORMAL LOW (ref 98–107)
EGFR (Non-African Amer.): >60
Glucose: 275 mg/dL — ABNORMAL HIGH (ref 65–99)
Osmolality: 280 (ref 275–301)
SGOT(AST): 22 U/L (ref 15–37)
SGPT (ALT): 26 U/L (ref 12–78)
Sodium: 134 mmol/L — ABNORMAL LOW (ref 136–145)

## 2013-01-16 LAB — CANCER ANTIGEN 27.29: CA 27.29: 24.7 U/mL (ref 0.0–38.6)

## 2013-02-10 ENCOUNTER — Ambulatory Visit: Payer: Self-pay | Admitting: Oncology

## 2013-02-28 ENCOUNTER — Encounter: Payer: Self-pay | Admitting: Family Medicine

## 2013-02-28 ENCOUNTER — Ambulatory Visit (INDEPENDENT_AMBULATORY_CARE_PROVIDER_SITE_OTHER): Payer: Medicare Other | Admitting: Family Medicine

## 2013-02-28 ENCOUNTER — Ambulatory Visit (INDEPENDENT_AMBULATORY_CARE_PROVIDER_SITE_OTHER)
Admission: RE | Admit: 2013-02-28 | Discharge: 2013-02-28 | Disposition: A | Discharge status: Home / Self Care | Payer: Medicare Other | Source: Ambulatory Visit | Attending: Family Medicine | Admitting: Family Medicine

## 2013-02-28 VITALS — BP 160/70 | Temp 98.0°F | Wt 205.0 lb

## 2013-02-28 DIAGNOSIS — M79609 Pain in unspecified limb: Secondary | ICD-10-CM

## 2013-02-28 DIAGNOSIS — E039 Hypothyroidism, unspecified: Secondary | ICD-10-CM

## 2013-02-28 DIAGNOSIS — C50919 Malignant neoplasm of unspecified site of unspecified female breast: Secondary | ICD-10-CM

## 2013-02-28 DIAGNOSIS — M25551 Pain in right hip: Secondary | ICD-10-CM

## 2013-02-28 DIAGNOSIS — R5383 Other fatigue: Secondary | ICD-10-CM | POA: Insufficient documentation

## 2013-02-28 DIAGNOSIS — R5381 Other malaise: Secondary | ICD-10-CM

## 2013-02-28 DIAGNOSIS — M25559 Pain in unspecified hip: Secondary | ICD-10-CM

## 2013-02-28 DIAGNOSIS — E119 Type 2 diabetes mellitus without complications: Secondary | ICD-10-CM

## 2013-02-28 DIAGNOSIS — M79604 Pain in right leg: Secondary | ICD-10-CM | POA: Insufficient documentation

## 2013-02-28 LAB — CBC WITH DIFFERENTIAL/PLATELET
Basophils Absolute: 0 10*3/uL (ref 0.0–0.1)
Basophils Relative: 0.5 % (ref 0.0–3.0)
Eosinophils Absolute: 0.2 10*3/uL (ref 0.0–0.7)
Hemoglobin: 12.5 g/dL (ref 12.0–15.0)
Lymphocytes Relative: 19.4 % (ref 12.0–46.0)
MCHC: 33.7 g/dL (ref 30.0–36.0)
MCV: 85.6 fl (ref 78.0–100.0)
Monocytes Absolute: 0.7 10*3/uL (ref 0.1–1.0)
Neutro Abs: 5.7 10*3/uL (ref 1.4–7.7)
RBC: 4.35 Mil/uL (ref 3.87–5.11)
RDW: 13.6 % (ref 11.5–14.6)

## 2013-02-28 LAB — COMPREHENSIVE METABOLIC PANEL
AST: 22 U/L (ref 0–37)
Alkaline Phosphatase: 46 U/L (ref 39–117)
BUN: 14 mg/dL (ref 6–23)
Creatinine, Ser: 0.7 mg/dL (ref 0.4–1.2)

## 2013-02-28 LAB — HEMOGLOBIN A1C: Hgb A1c MFr Bld: 8 % — ABNORMAL HIGH (ref 4.6–6.5)

## 2013-02-28 NOTE — Patient Instructions (Addendum)
Hang in there.  We are ordering blood work and xray today.  We will call you with these results.

## 2013-02-28 NOTE — Progress Notes (Signed)
72 yo with h/o breast CA here with several concerns.    Fatigue- past 3-4 months, increased severe fatigue.  Sometimes too tired to take her clothes off before bed.  Sleeping ok.  She is under increased stress at home- daughter moved in with two teenagers.  She does get yearly mammograms.  Up to date with her oncologist.  Colonoscopy UTD.  DM- On Glucovance 5-500 two tabs two times a day, Metformin 500 mg daily, Lantus 30 units daily.  Checks CBGs daily.  States that CBGs running between 99-150.  Had one episode of hypoglyemia several weeks.   Lab Results  Component Value Date   HGBA1C 8.1* 06/27/2012    BP- on Diovan- HCT. BP elevated today.  Denies any HA, burred vision or LE edema.   Right sided hip pain- persistent for past two months, sometimes feels it on left.  Has knee pain as well that sometimes radiates down her leg. No trauma.  H/o back surgery but states this feels different.  Can bend and move without pain but pressure on her "back side" hurts.     Patient Active Problem List  Diagnosis  . CARCINOMA, BREAST, RIGHT  . HYPOTHYROIDISM  . DIABETES MELLITUS, TYPE II  . HYPERTENSION  . HIATAL HERNIA  . SHOULDER PAIN, RIGHT  . ALLERGY, ENVIRONMENTAL  . HEEL PAIN, RIGHT  . Dysphagia  . Other malaise and fatigue   Past Medical History  Diagnosis Date  . Diabetes mellitus   . Hypertension   . Thyroid disease   . Osteoporosis    Past Surgical History  Procedure Laterality Date  . Mumps  1965    h/o  . Miscarriage  1970  . Septoplasty  1970  . Appendectomy    . Vaginal hysterectomy  1974    partial, fibroids  . Tonsillectomy  1945  . Foot surgery  1992 & 1994    bilateral  . Back surgery  1983    discectomy  . Ventral hernia repair    . 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 Colonoscopy    . Vaginal delivery      x5  . Other surgical history  04/03/02    dexa, wnl  . Other surgical history  03/12/04    dexa, wnl  . Doppler  echocardiography  02/24/06    Triv MR, EF 55-60%  . Other surgical history  08/12/05    dexa-osteopor Hip, osteopen spine  . 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 Other surgical history  10/25/07    dexa- nml hip and spine   History  Substance Use Topics  . Smoking status: Never Smoker   . Smokeless tobacco: Not on file  . Alcohol Use: Not on file   Family History  Problem Relation Age of Onset  . Heart failure Father   . Hypertension Father   . Stroke Father   . Lung cancer Brother     hx of smoking  . Diabetes Mother   . Hypertension Mother   . Heart attack Mother   . Stroke Brother   . Heart attack Brother     PTCA MI x 2   Allergies  Allergen Reactions  . Hydrocodone-Homatropine     REACTION: DROVE HER UP THE WALL  . Rosiglitazone Maleate     REACTION: SWELLING   Current Outpatient Prescriptions on File Prior to Visit  Medication Sig Dispense Refill  .  aspirin 81 MG tablet Take 81 mg by mouth daily.        . Blood Glucose Monitoring Suppl (ACCU-CHEK AVIVA PLUS) W/DEVICE KIT Use to check blood sugar twice daily       . diphenhydramine-acetaminophen (TYLENOL PM) 25-500 MG TABS Take 1/2 tablet by mouth at bedtime as needed       . ezetimibe (ZETIA) 10 MG tablet Take 1 tablet (10 mg total) by mouth daily.  30 tablet  11  . glucose blood test strip (Accu-chek compact) Use as instructed  100 each  6  . glyBURIDE-metformin (GLUCOVANCE) 5-500 MG per tablet Take 2 tablets by mouth 2 (two) times daily.  120 tablet  11  . guaiFENesin (MUCINEX) 600 MG 12 hr tablet as needed.        . insulin glargine (LANTUS) 100 UNIT/ML injection Inject 28 units and as needed on a sliding scale      . Insulin Pen Needle 32G X 4 MM MISC Use as directed  100 each  6  . Lancets (ACCU-CHEK MULTICLIX) lancets Use as instructed       . metFORMIN (GLUCOPHAGE) 500 MG tablet Take 1 tablet (500 mg total) by mouth daily.  30 tablet  6  . Multiple Vitamins-Minerals (CENTRUM SILVER PO) Take 1  tablet by mouth daily.        Marland Kitchen nystatin (MYCOSTATIN) powder Apply to affected area twice daily as needed.  15 g  0  . Probiotic Product (PROBIOTIC DAILY PO) Take 1 tablet by mouth daily.      . valsartan-hydrochlorothiazide (DIOVAN-HCT) 160-25 MG per tablet Take 1 tablet by mouth daily.  30 tablet  10  . vitamin C (ASCORBIC ACID) 500 MG tablet Take 500 mg by mouth 2 (two) times daily.         No current facility-administered medications on file prior to visit.     The PMH, PSH, Social History, Family History, Medications, and allergies have been reviewed in [facility], and have been updated if relevant.   Review of Systems       See HPI No changes in her bowel habits No blood in stool but does have hemorrhoids that bleed No LE tingling or weakness but do feel "heavy" No CP or SOB   Physical Exam BP 160/70  Temp(Src) 98 F (36.7 C)  Wt 205 lb (92.987 kg)  BMI 33.1 kg/m2  General:  Well-developed,well-nourished,in no acute distress; alert,appropriate and cooperative throughout examination, mildly overweight. Head:  Normocephalic and atraumatic without obvious abnormalities. Wears wig due to past chemo. Eyes:  Conjunctiva clear bilaterally.  Ears:  External ear exam shows no significant lesions or deformities.  Otoscopic examination reveals clear canals, tympanic membranes are intact bilaterally without bulging, retraction, inflammation or discharge. Hearing is grossly normal bilaterally. Nose:  External nasal examination shows no deformity or inflammation. Nasal mucosa are pink and moist without lesions or exudates. Mouth:  Oral mucosa and oropharynx without lesions or exudates.  Teeth in good repair. Neck:  No deformities, masses, or tenderness noted. Lungs:  Normal respiratory effort, chest expands symmetrically. Lungs are clear to auscultation, no crackles or wheezes. Heart:  Normal rate and regular rhythm. S1 and S2 normal without gallop, murmur, click, rub or other extra  sounds. Abdomen:  Bowel sounds positive,abdomen soft and non-tender without masses, organomegaly or hernias noted. Msk:  right 2nd toe- visible absncess on toe, open, draining, small area of warmth and erythema.   Extremities:    R arm with chronic lymphedema.  FROM of hips bilaterally, Right bursa TTP Neurologic:  No cranial nerve deficits noted. Station and gait are normal. Plantar reflexes are down-going bilaterally. DTRs are symmetrical throughout. Sensory, motor and coordinative functions appear intact. Psych: tearful today  Assessment and Plan: 1. DIABETES MELLITUS, TYPE II Not under great control and missed her follow up diabetes appt.  Recheck a1c today. - Hemoglobin A1c  2. CARCINOMA, BREAST, RIGHT In remission- followed by oncology.  3. HYPOTHYROIDISM Not on replacement. Lab Results  Component Value Date   TSH 2.63 06/27/2012   Recheck labs today. - TSH - T4, Free  4. Other malaise and fatigue Likely multifactorial.  Will check labs today for further work up.  The patient indicates understanding of these issues and agrees with the plan.  - CBC with Differential - Vitamin D, 25-hydroxy - Vitamin B12 - Comprehensive metabolic panel  5. Right hip pain Most consistent with bursitis but given h/o CA, will check plain films today. - DG Hip Complete Right; Future

## 2013-03-01 ENCOUNTER — Other Ambulatory Visit: Payer: Self-pay | Admitting: Family Medicine

## 2013-03-01 DIAGNOSIS — E1165 Type 2 diabetes mellitus with hyperglycemia: Secondary | ICD-10-CM

## 2013-03-01 LAB — VITAMIN D 25 HYDROXY (VIT D DEFICIENCY, FRACTURES): Vit D, 25-Hydroxy: 59 ng/mL (ref 30–89)

## 2013-03-07 ENCOUNTER — Ambulatory Visit (INDEPENDENT_AMBULATORY_CARE_PROVIDER_SITE_OTHER): Payer: Medicare Other | Admitting: *Deleted

## 2013-03-07 DIAGNOSIS — E538 Deficiency of other specified B group vitamins: Secondary | ICD-10-CM

## 2013-03-07 MED ORDER — CYANOCOBALAMIN 1000 MCG/ML IJ SOLN
1000.0000 ug | Freq: Once | INTRAMUSCULAR | Status: AC
Start: 2013-03-07 — End: 2013-03-07
  Administered 2013-03-07: 1000 ug via INTRAMUSCULAR

## 2013-03-21 ENCOUNTER — Encounter: Payer: Self-pay | Admitting: Internal Medicine

## 2013-03-21 ENCOUNTER — Ambulatory Visit (INDEPENDENT_AMBULATORY_CARE_PROVIDER_SITE_OTHER): Payer: 59 | Admitting: Internal Medicine

## 2013-03-21 VITALS — BP 148/82 | HR 116 | Temp 97.9°F | Resp 12 | Ht 66.0 in | Wt 202.0 lb

## 2013-03-21 DIAGNOSIS — E119 Type 2 diabetes mellitus without complications: Secondary | ICD-10-CM

## 2013-03-21 MED ORDER — INSULIN GLARGINE 100 UNIT/ML ~~LOC~~ SOLN: SUBCUTANEOUS | Status: DC

## 2013-03-21 MED ORDER — SITAGLIPTIN PHOSPHATE 100 MG PO TABS: 100.0000 mg | ORAL_TABLET | Freq: Every day | ORAL | Status: DC

## 2013-03-21 MED ORDER — METFORMIN HCL 500 MG PO TABS: 1000.0000 mg | ORAL_TABLET | Freq: Every day | ORAL | Status: DC

## 2013-03-21 NOTE — Progress Notes (Signed)
Subjective:     Patient ID: [name], female   DOB: 10/01/1941, 72 y.o.   MRN: [id number]  HPI [name] is a 72 y/o woman referred by PCP, Dr. Dayton Martes, for management of DM 2, insulin-dependent, uncontrolled, without known complications.  Patient has been diagnosed with diabetes in the 1980. She has started insulin in 2006. She remember her last 2 babies was large. Her last HbA1c was 8.0% on 02/28/2013, previously 8.1% in 06/27/2012. She is on a regimen of: - Lantus 30 units  - Glucovance (glyburide-metformin) 09-999 mg twice a day - Metformin 500 mg daily Sugars spiking in the pm, she is very unhappy about this and became tearful during the visit.   She checks her sugars: - am: 70-107, occasionally bottoming out: 44-50 in am if works outside - not checked later  30-day average: 97 - but only checks in am. She has hypoglycemia awareness at 60. She takes glucose tablets or OJ. She drinks 2-20 oz diet in a day. She drinks water, too.   Her meals are: - Breakfast: oatmeal, walnuts  - Lunch: bananas sandwich, dinner leftovers  - Dinner: fish/chicken, salad, soups  - Snacks: M&Ms, crackers and cheese  She has a snack every night: PB crackers and milk as he needs to take Tylenol arthritis.  She does not have diabetic retinopathy. Last eye exam next month - she has yearly eye exam. No DR. Denies numbness and tingling in her leg, has pain in legs. She does not have chronic kidney disease, her last BUN/creatinine was 14/0.7, GFR 91.5 on of BMP from 02/28/2013. She is on an ARB. The rest of the BMP showed decreased sodium at 133, decreased chloride of 93 and a glucose of 176. At that time, her vitamin B12 was low, at 212, and she was started on B12 injections. Her CBC was normal, vitamin D was 59. Last TSH was 4.27.   I reviewed her chart. She also has a history of breast cancer - dx 2000; had chemotx then for 6 months, hypothyroidism, hypertension, osteoporosis, history of back surgeries, right  hip pain, hiatal hernia with dysphagia.  She has extensive FH of Dm2.    Review of Systems Constitutional: + Both weight gain/loss, + fatigue, no subjective hyperthermia/hypothermia Eyes: + blurry vision, no xerophthalmia ENT: no sore throat, no nodules palpated in throat, no dysphagia/odynophagia, no hoarseness Cardiovascular: no CP/SOB/palpitations/left arm swelling - since chemotherapy Respiratory: no cough/SOB Gastrointestinal: no N/V/D/C Musculoskeletal: Both muscle/joint aches Skin: no rashes Neurological: no tremors/numbness/tingling/dizziness Psychiatric: no depression/anxiety  Past Medical History  Diagnosis Date  . Diabetes mellitus   . Hypertension   . Thyroid disease   . Osteoporosis    Past Surgical History  Procedure Laterality Date  . Mumps  1965    h/o  . Miscarriage  1970  . Septoplasty  1970  . Appendectomy    . Vaginal hysterectomy  1974    partial, fibroids  . Tonsillectomy  1945  . Foot surgery  1992 & 1994    bilateral  . Back surgery  1983    discectomy  . Ventral hernia repair    . 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 Colonoscopy    . Vaginal delivery      x5  . Other surgical history  04/03/02    dexa, wnl  . Other surgical history  03/12/04    dexa, wnl  . Doppler echocardiography  02/24/06    Triv  MR, EF 55-60%  . Other surgical history  08/12/05    dexa-osteopor Hip, osteopen spine  . 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 Other surgical history  10/25/07    dexa- nml hip and spine   History   Social History  . Marital Status: Married    Spouse Name: N/A    Number of Children: 5  . Years of Education: N/A   Occupational History  . Retired Runner, broadcasting/film/video    Social History Main Topics  . Smoking status: Never Smoker   . Smokeless tobacco: No  . Alcohol Use: No  . Drug Use: No  . Sexually Active: No   Social History Narrative   Married 50th anniversary (8/08)         Current Outpatient  Prescriptions on File Prior to Visit  Medication Sig Dispense Refill  . aspirin 81 MG tablet Take 81 mg by mouth daily.        . Blood Glucose Monitoring Suppl (ACCU-CHEK AVIVA PLUS) W/DEVICE KIT Use to check blood sugar twice daily       . diphenhydramine-acetaminophen (TYLENOL PM) 25-500 MG TABS Take 1/2 tablet by mouth at bedtime as needed       . ezetimibe (ZETIA) 10 MG tablet Take 1 tablet (10 mg total) by mouth daily.  30 tablet  11  . glucose blood test strip (Accu-chek compact) Use as instructed  100 each  6  . glyBURIDE-metformin (GLUCOVANCE) 5-500 MG per tablet Take 2 tablets by mouth 2 (two) times daily.  120 tablet  11  . guaiFENesin (MUCINEX) 600 MG 12 hr tablet as needed.        . insulin glargine (LANTUS) 100 UNIT/ML injection Inject 28 units and as needed on a sliding scale      . Insulin Pen Needle 32G X 4 MM MISC Use as directed  100 each  6  . Lancets (ACCU-CHEK MULTICLIX) lancets Use as instructed       . metFORMIN (GLUCOPHAGE) 500 MG tablet Take 1 tablet (500 mg total) by mouth daily.  30 tablet  6  . Multiple Vitamins-Minerals (CENTRUM SILVER PO) Take 1 tablet by mouth daily.        Marland Kitchen nystatin (MYCOSTATIN) powder Apply to affected area twice daily as needed.  15 g  0  . omeprazole (PRILOSEC) 20 MG capsule Take 20 mg by mouth daily.      . Probiotic Product (PROBIOTIC DAILY PO) Take 1 tablet by mouth daily.      . valsartan-hydrochlorothiazide (DIOVAN-HCT) 160-25 MG per tablet Take 1 tablet by mouth daily.  30 tablet  10  . vitamin C (ASCORBIC ACID) 500 MG tablet Take 500 mg by mouth 2 (two) times daily.         No current facility-administered medications on file prior to visit.    Allergies  Allergen Reactions  . Hydrocodone-Homatropine     REACTION: DROVE HER UP THE WALL  . Rosiglitazone Maleate     REACTION: SWELLING   Family History  Problem Relation Age of Onset  . Heart failure Father   . Hypertension Father   . Stroke Father   . Lung cancer Brother      hx of smoking  . Diabetes Mother   . Hypertension Mother   . Heart attack Mother   . Stroke Brother   . Heart attack Brother     PTCA MI x 2   Objective:   Physical Exam BP  148/82  Pulse 116  Temp(Src) 97.9 F (36.6 C) (Oral)  Resp 12  Ht 5\' 6"  (1.676 m)  Wt 202 lb (91.627 kg)  BMI 32.62 kg/m2  SpO2 96% Wt Readings from Last 3 Encounters:  03/21/13 202 lb (91.627 kg)  02/28/13 205 lb (92.987 kg)  09/08/12 203 lb 8 oz (92.307 kg)  Constitutional: overweight, in NAD Eyes: PERRLA, EOMI, no exophthalmos ENT: moist mucous membranes, no thyromegaly, no cervical lymphadenopathy Cardiovascular: RRR, No MRG Respiratory: CTA B Gastrointestinal: abdomen soft, NT, ND, BS+ Musculoskeletal: no deformities, strength intact in all 4 Skin: moist, warm, no rashes, splitting nails on 2 fingers Neurological: Very mild tremor with outstretched hands, DTR normal in all 4    Assessment:     1. DM2, uncontrolled, insulin-dependent, without complications  2. Vit B12 deficiency - Started B12 injections    Plan:     1. DM2 - Patient has long-standing mild DM 2, insulin-dependent, with frequent low CBGs especially fasting in a.m. or if she skips a meal. Typically she notices that her blood sugars increase during the day regardless of what she eats. She does not have much appetite, but she tries to eat 3 meals a day. I explained that this is a, and sign off having to much basal insulin and not enough mealtime coverage. - We will decrease her Lantus dose from 30 units to 25 units, and move it to bedtime as compared to midday. Target fasting sugars for her would be between 90 and 120.  - Since she is on 2500 mg of metformin daily, I would decrease this to 2000 units daily split in 2 doses, which is the target dose - She is also on glyburide, which is risky in elderly patients, for concerns of low blood sugars, so will stop her Glucovance and switch to only metformin as mentioned above  - given sugar  log and advised how to fill it and to bring it at next appt -I advised her to check her sugars at least 2-3 times a day for the next month, before meals  - given foot care handout and explained the principles - given instructions for hypoglycemia management "15-15 rule" - No labs for today  - Return to clinic in one month with her log   2.vitamin B12 deficiency  - The patient had many questions about her fatigue, and we reviewed her thyroid tests, with the last TSH being 4.27, previously 2.63. I recommended that she gets this rechecked but I would not start any treatment for now. The likely cause for her fatigue is both uncontrolled diabetes but, more so, her B12 deficiency. I am sure she will feel better when her B12 vitamin is repleted.

## 2013-03-21 NOTE — Patient Instructions (Addendum)
Please return in 1 month with your sugar log.  Please decrease Lantus to 25 units at night. Please stop Glucovance.  Continue metformin, at 1000 mg by mouth twice a day with meals.  Start Januvia 100 mg daily. Please call or send me a message with any questions or concerns   PATIENT INSTRUCTIONS FOR TYPE 2 DIABETES:  **Please join MyChart!** - see attached instructions about how to join   DIET AND EXERCISE Diet and exercise is an important part of diabetic treatment.  We recommended aerobic exercise in the form of brisk walking (working between 40-60% of maximal aerobic capacity, similar to brisk walking) for 150 minutes per week (such as 30 minutes five days per week) along with 3 times per week performing 'resistance' training (using various gauge rubber tubes with handles) 5-10 exercises involving the major muscle groups (upper body, lower body and core) performing 10-15 repetitions (or near fatigue) each exercise. Start at half the above goal but build slowly to reach the above goals. If limited by weight, joint pain, or disability, we recommend daily walking in a swimming pool with water up to waist to reduce pressure from joints while allow for adequate exercise.    BLOOD GLUCOSES Monitoring your blood glucoses is important for continued management of your diabetes. Please check your blood glucoses 2-4 times a day: fasting, before meals and at bedtime (you can rotate these measurements - e.g. one day check before the 3 meals, the next day check before 2 of the meals and before bedtime, etc.   HYPOGLYCEMIA (low blood sugar) Hypoglycemia is usually a reaction to not eating, exercising, or taking too much insulin/ other diabetes drugs.  Symptoms include tremors, sweating, hunger, confusion, headache, etc. Treat IMMEDIATELY with 15 grams of Carbs:   4 glucose tablets    cup regular juice/soda   2 tablespoons raisins   4 teaspoons sugar   1 tablespoon honey Recheck blood glucose in 15  mins and repeat above if still symptomatic/blood glucose <100. Please contact our office at [phone] if you have questions about how to next handle your insulin.  RECOMMENDATIONS TO REDUCE YOUR RISK OF DIABETIC COMPLICATIONS: * Take your prescribed MEDICATION(S). * Follow a DIABETIC diet: Complex carbs, fiber rich foods, heart healthy fish twice weekly, (monounsaturated and polyunsaturated) fats * AVOID saturated/trans fats, high fat foods, >2,300 mg salt per day. * EXERCISE at least 5 times a week for 30 minutes or preferably daily.  * DO NOT SMOKE OR DRINK more than 1 drink a day. * Check your FEET every day. Do not wear tightfitting shoes. Contact us if you develop an ulcer * See your EYE doctor once a year or more if needed * Get a FLU shot once a year * Get a PNEUMONIA vaccine once before and once after age 74 years  GOALS:  * Your Hemoglobin A1c of <7%  * Your Systolic BP should be 140 or lower  * Your Diastolic BP should be 80 or lower  * Your HDL (Good Cholesterol) should be 40 or higher  * Your LDL (Bad Cholesterol) should be 100 or lower  * Your Triglycerides should be 150 or lower  * Your Urine microalbumin (kidney function) should be <30 * Your Body Mass Index should be 25 or lower   We will be glad to help you achieve these goals. Our telephone number is: [phone].

## 2013-03-25 ENCOUNTER — Emergency Department: Payer: Self-pay | Admitting: Emergency Medicine

## 2013-03-25 LAB — URINALYSIS, COMPLETE
Glucose,UR: >=500 mg/dL (ref 0–75)
Ketone: NEGATIVE
Specific Gravity: 1.009 (ref 1.003–1.030)
Squamous Epithelial: 9
WBC UR: 744 /HPF (ref 0–5)

## 2013-03-25 LAB — COMPREHENSIVE METABOLIC PANEL
Albumin: 3.5 g/dL (ref 3.4–5.0)
BUN: 12 mg/dL (ref 7–18)
Chloride: 94 mmol/L — ABNORMAL LOW (ref 98–107)
Co2: 26 mmol/L (ref 21–32)
EGFR (Non-African Amer.): >60
Glucose: 258 mg/dL — ABNORMAL HIGH (ref 65–99)
SGOT(AST): 32 U/L (ref 15–37)

## 2013-03-25 LAB — CBC
HCT: 37.9 % (ref 35.0–47.0)
HGB: 12.3 g/dL (ref 12.0–16.0)
MCHC: 32.6 g/dL (ref 32.0–36.0)
MCV: 86 fL (ref 80–100)
RDW: 13.2 % (ref 11.5–14.5)
WBC: 16.6 10*3/uL — ABNORMAL HIGH (ref 3.6–11.0)

## 2013-03-25 LAB — PROTIME-INR
INR: 0.9
Prothrombin Time: 12.4 secs (ref 11.5–14.7)

## 2013-03-27 ENCOUNTER — Telehealth: Payer: Self-pay | Admitting: Family Medicine

## 2013-03-27 DIAGNOSIS — E119 Type 2 diabetes mellitus without complications: Secondary | ICD-10-CM

## 2013-03-27 LAB — URINE CULTURE

## 2013-03-27 MED ORDER — METFORMIN HCL 1000 MG PO TABS: 1000.0000 mg | ORAL_TABLET | Freq: Two times a day (BID) | ORAL | Status: DC

## 2013-03-27 NOTE — Telephone Encounter (Signed)
Called and talked to the patient, she was only taking 500 mg of metformin twice a day instead of 1000 mg twice a day as advised in her patient instructions from last visit.  The problem was that when I sent a prescription to the pharmacy I accidentally sent it as 1000 mg daily. Her last sugar was 244. I advised her to increase her metformin as intended and I will send a new prescription to her pharmacy. I apologized for the mistaking her prescription. The patient understood, and she will institute the change.  For her UTI, the patient was in the emergency room on Sunday morning, after having blood in her urine. She was found to have high white blood cells in her urine. A CT abdomen was negative. She was started on ciprofloxacin 2 times a day and she's feeling better except for her higher sugars.

## 2013-03-27 NOTE — Telephone Encounter (Signed)
Yes infection can throw off her blood sugar.  I reviewed Dr. Charlean Sanfilippo note and her changes seem very appropriate.  Please await Dr. Charlean Sanfilippo return call.  Cipro is also an appropriate medication to treat UTIs.

## 2013-03-27 NOTE — Telephone Encounter (Signed)
Patient Information:  Caller Name: Teddie  Phone: [phone]  Patient: [name], [name]  Gender: Female  DOB: 1941-07-12  Age: 72 Years  PCP: Ruthe Mannan [facility])  Office Follow Up:  Does the office need to follow up with this patient?: Yes  Instructions For The Office: Patient is very tearful and concerned about "feeling bad with UTI and her blood sugars". She is requesting feedback from Dr. Dayton Martes  RN Note:  She is very tearful and is concerned about UTI and her diabetes.  She is requesting feedback from Dr. Dayton Martes.  "Am I supposed to be taking just one metformin (plain)?   Please contact patient.  Symptoms  Reason For Call & Symptoms: Patient states she has two issues (#1) she was seen in the ER at [facility]- for blood in urine on Sunday 03/25/13   she was given Cipro 500mg  bid. . She has had x4 doses. She also had a CT scan. No longer seeing blood in urine and not having pressure but does not feel good. Head feels funny, eyes feel funny. Awake and oriented. Only can describe does not feel good.    (#2)  She is very upset with her Endocrionologist on 03/21/13 with Dr. Elvera Lennox. Her A1-C was elevated 8.1.   She changed her medications. She placed her Januvia once daily. Metformin (plain)  500mg  in the morning and night (she was taking  Metformin -Glyburide 1000mg  in morning and at night). Lantus Pen changed to from 30 units to 25 units.  She has been filling out her sugar log-  Monday 02/23/13- Before breakfast #156.  Before lunch #229.  Before dinner #226 and at Bedtime #198.   Tuesday , 03/27/13  Before Breakfast #184.  She states she understands and infection will throw her blood sugar and system out of whack.  She is very concerned about what is going on. She has not contacted Dr. Elvera Lennox MD.  She is requesting Feedback from Dr. Dayton Martes MD.  Reviewed Health History In EMR: Yes  Reviewed Medications In EMR: Yes  Reviewed Allergies In EMR: Yes  Reviewed Surgeries / Procedures: Yes  Date of  Onset of Symptoms: 03/25/2013  Treatments Tried: Cipro  Treatments Tried Worked: No  Guideline(s) Used:  Urination Pain - Female  Diabetes - High Blood Sugar  Disposition Per Guideline:   Discuss with PCP and Callback by Nurse Today  Reason For Disposition Reached:   Caller has NON-URGENT medication question about med that PCP prescribed and triager unable to answer question  Advice Given:  Warm Saline SITZ Baths to Reduce Pain:  Sit in a warm saline bath for 20 minutes to cleanse the area and to reduce pain. Add 2 oz. of table salt or baking soda to a tub of water.  Call Back If:  You become worse.  Warm Saline SITZ Baths to Reduce Pain:  Sit in a warm saline bath for 20 minutes to cleanse the area and to reduce pain. Add 2 oz. of table salt or baking soda to a tub of water.  Treatment - Liquids  Drink at least one glass (8 oz or 240 ml) of water per hour for the next 4 hours. (Reason: adequate hydration will reduce hyperglycemia).  Generally, you should try to drink 6-8 glasses of water each day.  Treatment - Diabetes Medications  : Continue taking your diabetes pills.  Measure and Record Your Blood Glucose  Every day you should measure your blood glucose before breakfast and before going  to bed.  Record the results and show them to your doctor at your next office visit.  Insulin  - Do Not Stop Taking It :  If you are supposed to be using insulin, do not stop taking it.  The reason is that during an illness you may need even more insulin than usual.  General  Do not stop taking your insulin. During illness the blood sugar often rises.  Check your blood glucose every 3-4 hours. Write down the results.  Drink liquids. It is important to prevent dehydration. Drink small amounts frequently.  Patient Will Follow Care Advice:  YES

## 2013-04-10 ENCOUNTER — Ambulatory Visit (INDEPENDENT_AMBULATORY_CARE_PROVIDER_SITE_OTHER): Payer: 59 | Admitting: *Deleted

## 2013-04-10 DIAGNOSIS — E538 Deficiency of other specified B group vitamins: Secondary | ICD-10-CM

## 2013-04-10 MED ORDER — CYANOCOBALAMIN 1000 MCG/ML IJ SOLN
1000.0000 ug | Freq: Once | INTRAMUSCULAR | Status: AC
  Administered 2013-04-10: 1000 ug via INTRAMUSCULAR

## 2013-04-16 ENCOUNTER — Encounter: Payer: Self-pay | Admitting: Family Medicine

## 2013-04-16 ENCOUNTER — Ambulatory Visit (INDEPENDENT_AMBULATORY_CARE_PROVIDER_SITE_OTHER): Payer: 59 | Admitting: Family Medicine

## 2013-04-16 VITALS — BP 144/64 | HR 72 | Temp 98.0°F | Wt 202.0 lb

## 2013-04-16 DIAGNOSIS — N760 Acute vaginitis: Secondary | ICD-10-CM | POA: Insufficient documentation

## 2013-04-16 DIAGNOSIS — M779 Enthesopathy, unspecified: Secondary | ICD-10-CM | POA: Insufficient documentation

## 2013-04-16 MED ORDER — TERCONAZOLE 0.8 % VA CREA: 1.0000 | TOPICAL_CREAM | Freq: Every day | VAGINAL | Status: DC

## 2013-04-16 NOTE — Progress Notes (Addendum)
SUBJECTIVE: [name] is a 72 y.o. female  Here for ? UTI vs yeast infection.  Went to [facility] one week ago for hematuria (we do not have these records). Per pt, CT of abdomen was neg. Was told she had positive LE- given cipro which she had a unfortunately tendonitis.  She is very tearful when she discusses this.  Dysuria- only when urine hits certain areas of vulva.  No back pain, fever, nausea or vomiting.  Patient Active Problem List   Diagnosis Date Noted  . Other malaise and fatigue 02/28/2013  . Bilateral leg pain 02/28/2013  . Dysphagia 07/04/2012  . HEEL PAIN, RIGHT 11/24/2010  . HIATAL HERNIA 02/10/2010  . ALLERGY, ENVIRONMENTAL 04/29/2009  . SHOULDER PAIN, RIGHT 01/23/2009  . CARCINOMA, BREAST, RIGHT 04/26/2007  . HYPOTHYROIDISM 04/26/2007  . DIABETES MELLITUS, TYPE II 04/26/2007  . HYPERTENSION 04/26/2007   Past Medical History  Diagnosis Date  . Diabetes mellitus   . Hypertension   . Thyroid disease   . Osteoporosis    Past Surgical History  Procedure Laterality Date  . Mumps  1965    h/o  . Miscarriage  1970  . Septoplasty  1970  . Appendectomy    . Vaginal hysterectomy  1974    partial, fibroids  . Tonsillectomy  1945  . Foot surgery  1992 & 1994    bilateral  . Back surgery  1983    discectomy  . Ventral hernia repair    . 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 Colonoscopy    . Vaginal delivery      x5  . Other surgical history  04/03/02    dexa, wnl  . Other surgical history  03/12/04    dexa, wnl  . Doppler echocardiography  02/24/06    Triv MR, EF 55-60%  . Other surgical history  08/12/05    dexa-osteopor Hip, osteopen spine  . 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 Other surgical history  10/25/07    dexa- nml hip and spine   History  Substance Use Topics  . Smoking status: Never Smoker   . Smokeless tobacco: Not on file  . Alcohol Use: Not on file   Family History  Problem Relation Age of  Onset  . Heart failure Father   . Hypertension Father   . Stroke Father   . Lung cancer Brother     hx of smoking  . Diabetes Mother   . Hypertension Mother   . Heart attack Mother   . Stroke Brother   . Heart attack Brother     PTCA MI x 2   Allergies  Allergen Reactions  . Ciprocinonide (Fluocinolone) Other (See Comments)    tendonitis  . Hydrocodone-Homatropine     REACTION: DROVE HER UP THE WALL  . Rosiglitazone Maleate     REACTION: SWELLING   Current Outpatient Prescriptions on File Prior to Visit  Medication Sig Dispense Refill  . acetaminophen (TYLENOL ARTHRITIS PAIN) 650 MG CR tablet Take 1,300 mg by mouth every 8 (eight) hours as needed for pain.      Marland Kitchen aspirin 81 MG tablet Take 81 mg by mouth daily.        . Blood Glucose Monitoring Suppl (ACCU-CHEK AVIVA PLUS) W/DEVICE KIT Use to check blood sugar twice daily       . diphenhydramine-acetaminophen (TYLENOL PM) 25-500 MG TABS Take 1/2 tablet by mouth at bedtime  as needed       . ezetimibe (ZETIA) 10 MG tablet Take 1 tablet (10 mg total) by mouth daily.  30 tablet  11  . glucose blood test strip (Accu-chek compact) Use as instructed  100 each  6  . guaiFENesin (MUCINEX) 600 MG 12 hr tablet as needed.        . insulin glargine (LANTUS) 100 UNIT/ML injection Inject 25 units every night.  5 pen  3  . Insulin Pen Needle 32G X 4 MM MISC Use as directed  100 each  6  . Lancets (ACCU-CHEK MULTICLIX) lancets Use as instructed       . metFORMIN (GLUCOPHAGE) 1000 MG tablet Take 1 tablet (1,000 mg total) by mouth 2 (two) times daily with a meal.  60 tablet  6  . Multiple Vitamins-Minerals (CENTRUM SILVER PO) Take 1 tablet by mouth daily.        Marland Kitchen nystatin (MYCOSTATIN) powder Apply to affected area twice daily as needed.  15 g  0  . omeprazole (PRILOSEC) 20 MG capsule Take 20 mg by mouth daily.      . Probiotic Product (PROBIOTIC DAILY PO) Take 1 tablet by mouth daily.      . sitaGLIPtin (JANUVIA) 100 MG tablet Take 1 tablet (100  mg total) by mouth daily.  30 tablet  5  . valsartan-hydrochlorothiazide (DIOVAN-HCT) 160-25 MG per tablet Take 1 tablet by mouth daily.  30 tablet  10  . vitamin C (ASCORBIC ACID) 500 MG tablet Take 500 mg by mouth 2 (two) times daily.         No current facility-administered medications on file prior to visit.   The PMH, PSH, Social History, Family History, Medications, and allergies have been reviewed in [facility], and have been updated if relevant.  OBJECTIVE: BP 144/64  Pulse 72  Temp(Src) 98 F (36.7 C)  Wt 202 lb (91.627 kg)  BMI 32.62 kg/m2  Appears well, in no apparent distress.  Vital signs are normal. The abdomen is soft without tenderness, guarding, mass, rebound or organomegaly. No CVA tenderness or inguinal adenopathy noted.  GU:  Irritated vulva with scant white discharge  ASSESSMENT/PLAN:  1. Tendonitis Resolving.  2. Vaginitis and vulvovaginitis Wet prep positive for yeast. Will treat with Terazol x 3 days. Call or return to clinic prn if these symptoms worsen or fail to improve as anticipated. The patient indicates understanding of these issues and agrees with the plan.  She could not leave urine sample today.  If symptoms persist after tx for yeast infection, will check UA.

## 2013-04-16 NOTE — Patient Instructions (Addendum)
Monilial Vaginitis Vaginitis in a soreness, swelling and redness (inflammation) of the vagina and vulva. Monilial vaginitis is not a sexually transmitted infection. CAUSES  Yeast vaginitis is caused by yeast (candida) that is normally found in your vagina. With a yeast infection, the candida has overgrown in number to a point that upsets the chemical balance. SYMPTOMS   White, thick vaginal discharge.  Swelling, itching, redness and irritation of the vagina and possibly the lips of the vagina (vulva).  Burning or painful urination.  Painful intercourse. DIAGNOSIS  Things that may contribute to monilial vaginitis are:  Postmenopausal and virginal states.  Pregnancy.  Infections.  Being tired, sick or stressed, especially if you had monilial vaginitis in the past.  Diabetes. Good control will help lower the chance.  Birth control pills.  Tight fitting garments.  Using bubble bath, feminine sprays, douches or deodorant tampons.  Taking certain medications that kill germs (antibiotics).  Sporadic recurrence can occur if you become ill. TREATMENT  Your caregiver will give you medication.  There are several kinds of anti monilial vaginal creams and suppositories specific for monilial vaginitis. For recurrent yeast infections, use a suppository or cream in the vagina 2 times a week, or as directed.  Anti-monilial or steroid cream for the itching or irritation of the vulva may also be used. Get your caregiver's permission.  Painting the vagina with methylene blue solution may help if the monilial cream does not work.  Eating yogurt may help prevent monilial vaginitis. HOME CARE INSTRUCTIONS   Finish all medication as prescribed.  Do not have sex until treatment is completed or after your caregiver tells you it is okay.  Take warm sitz baths.  Do not douche.  Do not use tampons, especially scented ones.  Wear cotton underwear.  Avoid tight pants and panty  hose.  Tell your sexual partner that you have a yeast infection. They should go to their caregiver if they have symptoms such as mild rash or itching.  Your sexual partner should be treated as well if your infection is difficult to eliminate.  Practice safer sex. Use condoms.  Some vaginal medications cause latex condoms to fail. Vaginal medications that harm condoms are:  Cleocin cream.  Butoconazole (Femstat).  Terconazole (Terazol) vaginal suppository.  Miconazole (Monistat) (may be purchased over the counter). SEEK MEDICAL CARE IF:   You have a temperature by mouth above 102 F (38.9 C).  The infection is getting worse after 2 days of treatment.  The infection is not getting better after 3 days of treatment.  You develop blisters in or around your vagina.  You develop vaginal bleeding, and it is not your menstrual period.  You have pain when you urinate.  You develop intestinal problems.  You have pain with sexual intercourse. Document Released: 09/08/2005 Document Revised: 02/21/2012 Document Reviewed: 05/23/2009 Exit[employer] Patient Information 2013 [employer].  

## 2013-04-20 ENCOUNTER — Telehealth: Payer: Self-pay | Admitting: Family Medicine

## 2013-04-20 MED ORDER — FLUCONAZOLE 150 MG PO TABS: 150.0000 mg | ORAL_TABLET | Freq: Once | ORAL | Status: DC

## 2013-04-20 NOTE — Telephone Encounter (Signed)
Noted.  Rx for diflucan sent to pharmacy.  Please call us on Monday if no improvement.

## 2013-04-20 NOTE — Telephone Encounter (Signed)
Advised patient as instructed. 

## 2013-04-20 NOTE — Telephone Encounter (Signed)
Patient Information:  Caller Name: Surie  Phone: [phone]  Patient: [name], [name]  Gender: Female  DOB: 06/07/1941  Age: 72 Years  PCP: Ruthe Mannan [facility])  Office Follow Up:  Does the office need to follow up with this patient?: Yes  Instructions For The Office: Pharmacy [facility] in [place].  Please contact patient advised to call back if not any better. Recent change in Diabetic medication. Slightly elevated blood sugars over the past week. On Metformin and Lantus and  Januvia started and taken off Glyburide.  PLEASE CONTACT  RN Note:  Pharmacy [facility] in [place].  Please contact patient advised to call back if not any better. Recent change in Diabetic medication. Slightly elevated blood sugars over the past week. On Metformin and Lantus and  Januvia started and taken off Glyburide.  PLEASE CONTACT  Symptoms  Reason For Call & Symptoms: Patient was in office on Monday., 04/16/13. Given medicaiton for yeast infection after taking Cipro.  She was given 3 days of Terazol.  She is not worse, but is not better. She feels irriation, clear to yellow discharge with some burning.. Advised to call back if  not better.    Recent change in Diabetic medication- A1C 8 Dr,G (?)  she is off glyburide/ She is now on Januvia, Metformin and Lantus.  Reviewed Health History In EMR: Yes  Reviewed Medications In EMR: Yes  Reviewed Allergies In EMR: Yes  Reviewed Surgeries / Procedures: Yes  Date of Onset of Symptoms: 04/13/2013  Treatments Tried: Terazol  Treatments Tried Worked: No  Guideline(s) Used:  Vaginal Discharge  Disposition Per Guideline:   See Within 3 Days in Office  Reason For Disposition Reached:   Symptoms of a yeast infection" (i.e., itchy, white discharge, not bad smelling) and not improved > 3 days following Care Advice  Advice Given:  Genital Hygiene:   Keep your genital area clean. Wash daily.  Keep your genital area dry. Wear cotton underwear or  underwear with a cotton crotch.  Do not douche.  Do not use feminine hygiene products.  Call Back If:  There is no improvement after treating yourself for a vaginal yeast infection  You become worse.  RN Overrode Recommendation:  Patient Requests Prescription  Pharmacy [facility] in [place].  Please contact patient advised to call back if not any better. Recent change in Diabetic medication. Slightly elevated blood sugars over the past week. On Metformin and Lantus and  Januvia started and taken off Glyburide.  PLEASE CONTACT

## 2013-04-23 ENCOUNTER — Telehealth: Payer: Self-pay | Admitting: Family Medicine

## 2013-04-23 NOTE — Telephone Encounter (Signed)
Patient Information:  Caller Name: Shade  Phone: [phone]  Patient: [name], [name]  Gender: Female  DOB: [birth year]  Age: 72 Years  PCP: Ruthe Mannan [facility])  Office Follow Up:  Does the office need to follow up with this patient?: No  Instructions For The Office: N/A  RN Note:  Afebrile.  States still feels poorly and has "abdominal pressure".  States her blood sugars are "all over the map" and that she has cut out all carbs and sweets to reduce this.  BS average was 97 on arising but over the past month has been > 150 on arising.  States this was precipitated by UTI but after that improved, her BS has not.  BS checked 0512/14 1000 and was 166.  Per diabetes and vaginal irritation protocols, emergent symptoms denied; advised appt within 72 hours.  Appt scheduled 04/24/13 1015 with Dr. Dayton Martes.  krs/can  Symptoms  Reason For Call & Symptoms: vaginal irritation; treated x 2 with monistat and diflucan, and told to call back 04/23/13 if not improved.  Reviewed Health History In EMR: Yes  Reviewed Medications In EMR: Yes  Reviewed Allergies In EMR: Yes  Reviewed Surgeries / Procedures: Yes  Date of Onset of Symptoms: Unknown  Guideline(s) Used:  Vaginal Discharge  Diabetes - High Blood Sugar  Disposition Per Guideline:   Home Care  Reason For Disposition Reached:   Blood glucose 60-240 mg/dl (3.5 -13 mmol/l)  Advice Given:  General  Definition of hyperglycemia: - Fasting blood glucose more than 140 mg/dL (7.5 mmol/l) or random blood glucose more than 200 mg/dL (11 mmol/l).  Symptoms of mild hyperglycemia: frequent urination, increased thirst, fatigue, blurred vision.  Symptoms of severe hyperglycemia: weakness, progressing to confusion and coma.  Treatment - Diabetes Medications  : Continue taking your diabetes pills.  Treatment - Insulin  Continue to take your insulin, as prescribed by your doctor.  Measure and Record Your Blood Glucose  Every day you should  measure your blood glucose before breakfast and before going to bed.  Record the results and show them to your doctor at your next office visit.  Patient Will Follow Care Advice:  YES

## 2013-04-24 ENCOUNTER — Encounter: Payer: Self-pay | Admitting: Family Medicine

## 2013-04-24 ENCOUNTER — Ambulatory Visit (INDEPENDENT_AMBULATORY_CARE_PROVIDER_SITE_OTHER): Payer: 59 | Admitting: Family Medicine

## 2013-04-24 VITALS — BP 140/66 | HR 88 | Temp 98.0°F | Wt 200.0 lb

## 2013-04-24 DIAGNOSIS — E119 Type 2 diabetes mellitus without complications: Secondary | ICD-10-CM

## 2013-04-24 DIAGNOSIS — N760 Acute vaginitis: Secondary | ICD-10-CM

## 2013-04-24 LAB — POCT URINALYSIS DIPSTICK
Blood, UA: NEGATIVE
Glucose, UA: NEGATIVE
Nitrite, UA: NEGATIVE
Protein, UA: NEGATIVE
Spec Grav, UA: <=1.005
Urobilinogen, UA: NEGATIVE

## 2013-04-24 MED ORDER — CLOBETASOL PROPIONATE 0.05 % EX OINT: TOPICAL_OINTMENT | CUTANEOUS | Status: DC

## 2013-04-24 NOTE — Patient Instructions (Addendum)
Good to see you. Please apply the clobetasol to your vulva nightly after warm shower x 2 weeks. Call me with an update after two weeks.  Lichen Planus Lichen planus is a skin problem that causes redness, itching, swelling, and sores. Some common areas affected are the:  Vulva and vagina.  Gums and inside of the mouth.  Esophagus.  Scalp.  Skin of the arms (especially wrists), legs, chest, back, and abdomen.  Fingernails or toenails. CAUSES The cause is not known. It could be an autoimmune illness or an allergy. An autoimmune illness is one where your body attacks itself. Lichen planus is not passed from one person to another (not contagious). It can last for a long time. Affected children usually have a history of other family members with lichen planus. SYMPTOMS  Itching, which can be severe.  The vaginal area may be red, sore, raw, or have a burning feeling.  There may be pain or bleeding during sex.  Scarring may cause the vagina to become short, narrow, or even closed up.  The skin may have small reddish or purplish, flat-topped, round or irregularly shaped bumps.  There may be redness or white patches on the gums or tongue.  The nails may become thin, rough, or have ridges in them.  The eyes can rarely also be involved with pain, redness, or scarring. DIAGNOSIS Your caregiver will look for skin changes, changes inside your mouth, or vaginal discharge. Sometimes, a tissue sample (biopsy) may be sent for testing. TREATMENT  Your caregiver may order a cream (topical steroid) to be put on the sores.  You may be given medicine to take by mouth.  You may be treated by exposure to ultraviolet light.  Sores in the mouth may be treated with lozenges that you suck on like a cough drop.  If the vagina becomes too tight, you may be taught how to use a dilator to keep it open. HOME CARE INSTRUCTIONS  Only take over-the-counter or prescription medicines as directed by your  caregiver.  Keep the vaginal area as clean and dry as possible. SEEK MEDICAL CARE IF: You develop increasing pain, swelling, or redness. Document Released: 04/22/2011 Document Revised: 02/21/2012 Document Reviewed: 04/22/2011 [facility] Patient Information 2013 [place], [place].

## 2013-04-24 NOTE — Progress Notes (Signed)
SUBJECTIVE: [name] is a 72 y.o. female for persistent vaginal irritation.  Went to [facility] two week ago for hematuria (we do not have these records). Per pt, CT of abdomen was neg. Was told she had positive LE- given cipro which she had a unfortunately tendonitis.  She is very tearful when she discusses this.  Dysuria- only when urine hits certain areas of vulva.  No back pain, fever, nausea or vomiting.  When I saw her 6 days ago, Wet prep positive for yeast- treated with terazol x 3 days.  Symptoms better- no itching but still having irritation.    Patient Active Problem List   Diagnosis Date Noted  . Tendonitis 04/16/2013  . Vaginitis and vulvovaginitis 04/16/2013  . Bilateral leg pain 02/28/2013  . HEEL PAIN, RIGHT 11/24/2010  . HIATAL HERNIA 02/10/2010  . ALLERGY, ENVIRONMENTAL 04/29/2009  . SHOULDER PAIN, RIGHT 01/23/2009  . CARCINOMA, BREAST, RIGHT 04/26/2007  . HYPOTHYROIDISM 04/26/2007  . DIABETES MELLITUS, TYPE II 04/26/2007  . HYPERTENSION 04/26/2007   Past Medical History  Diagnosis Date  . Diabetes mellitus   . Hypertension   . Thyroid disease   . Osteoporosis    Past Surgical History  Procedure Laterality Date  . Mumps  1965    h/o  . Miscarriage  1970  . Septoplasty  1970  . Appendectomy    . Vaginal hysterectomy  1974    partial, fibroids  . Tonsillectomy  1945  . Foot surgery  1992 & 1994    bilateral  . Back surgery  1983    discectomy  . Ventral hernia repair    . 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 Colonoscopy    . Vaginal delivery      x5  . Other surgical history  04/03/02    dexa, wnl  . Other surgical history  03/12/04    dexa, wnl  . Doppler echocardiography  02/24/06    Triv MR, EF 55-60%  . Other surgical history  08/12/05    dexa-osteopor Hip, osteopen spine  . 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 Other surgical history  10/25/07    dexa- nml hip and spine   History   Substance Use Topics  . Smoking status: Never Smoker   . Smokeless tobacco: Not on file  . Alcohol Use: Not on file   Family History  Problem Relation Age of Onset  . Heart failure Father   . Hypertension Father   . Stroke Father   . Lung cancer Brother     hx of smoking  . Diabetes Mother   . Hypertension Mother   . Heart attack Mother   . Stroke Brother   . Heart attack Brother     PTCA MI x 2   Allergies  Allergen Reactions  . Ciprocinonide (Fluocinolone) Other (See Comments)    tendonitis  . Hydrocodone-Homatropine     REACTION: DROVE HER UP THE WALL  . Rosiglitazone Maleate     REACTION: SWELLING   Current Outpatient Prescriptions on File Prior to Visit  Medication Sig Dispense Refill  . acetaminophen (TYLENOL ARTHRITIS PAIN) 650 MG CR tablet Take 1,300 mg by mouth every 8 (eight) hours as needed for pain.      Marland Kitchen aspirin 81 MG tablet Take 81 mg by mouth daily.        . Blood Glucose Monitoring Suppl (ACCU-CHEK AVIVA PLUS) W/DEVICE KIT Use to  check blood sugar twice daily       . diphenhydramine-acetaminophen (TYLENOL PM) 25-500 MG TABS Take 1/2 tablet by mouth at bedtime as needed       . ezetimibe (ZETIA) 10 MG tablet Take 1 tablet (10 mg total) by mouth daily.  30 tablet  11  . fluconazole (DIFLUCAN) 150 MG tablet Take 1 tablet (150 mg total) by mouth once.  1 tablet  0  . glucose blood test strip (Accu-chek compact) Use as instructed  100 each  6  . guaiFENesin (MUCINEX) 600 MG 12 hr tablet as needed.        . insulin glargine (LANTUS) 100 UNIT/ML injection Inject 25 units every night.  5 pen  3  . Insulin Pen Needle 32G X 4 MM MISC Use as directed  100 each  6  . Lancets (ACCU-CHEK MULTICLIX) lancets Use as instructed       . metFORMIN (GLUCOPHAGE) 1000 MG tablet Take 1 tablet (1,000 mg total) by mouth 2 (two) times daily with a meal.  60 tablet  6  . Multiple Vitamins-Minerals (CENTRUM SILVER PO) Take 1 tablet by mouth daily.        Marland Kitchen nystatin (MYCOSTATIN)  powder Apply to affected area twice daily as needed.  15 g  0  . omeprazole (PRILOSEC) 20 MG capsule Take 20 mg by mouth daily.      . sitaGLIPtin (JANUVIA) 100 MG tablet Take 1 tablet (100 mg total) by mouth daily.  30 tablet  5  . terconazole (TERAZOL 3) 0.8 % vaginal cream Place 1 applicator vaginally at bedtime.  20 g  0  . valsartan-hydrochlorothiazide (DIOVAN-HCT) 160-25 MG per tablet Take 1 tablet by mouth daily.  30 tablet  10  . vitamin C (ASCORBIC ACID) 500 MG tablet Take 500 mg by mouth 2 (two) times daily.         No current facility-administered medications on file prior to visit.   The PMH, PSH, Social History, Family History, Medications, and allergies have been reviewed in [facility], and have been updated if relevant.  OBJECTIVE: BP 140/66  Pulse 88  Temp(Src) 98 F (36.7 C)  Wt 200 lb (90.719 kg)  BMI 32.3 kg/m2  Appears well, in no apparent distress.  Vital signs are normal. The abdomen is soft without tenderness, guarding, mass, rebound or organomegaly. No CVA tenderness or inguinal adenopathy noted.  GU:  Irritated vulva without visible discharge Psych:  Tearful and anxious  ASSESSMENT/PLAN:  1. Vaginitis and vulvovaginitis Improved but persistent. UA neg.  Wet prep neg. ?lichen planus. Will treat with clobetasol 0.05% ointment nightly x 2 weeks. She will call me with an update.

## 2013-04-24 NOTE — Addendum Note (Signed)
Addended by: Eliezer Bottom on: 04/24/2013 11:07 AM   Modules accepted: Orders

## 2013-04-25 ENCOUNTER — Ambulatory Visit (INDEPENDENT_AMBULATORY_CARE_PROVIDER_SITE_OTHER): Payer: 59 | Admitting: Internal Medicine

## 2013-04-25 ENCOUNTER — Encounter: Payer: Self-pay | Admitting: Internal Medicine

## 2013-04-25 VITALS — BP 130/70 | HR 100 | Temp 98.1°F | Resp 10 | Ht 66.0 in | Wt 200.0 lb

## 2013-04-25 DIAGNOSIS — E119 Type 2 diabetes mellitus without complications: Secondary | ICD-10-CM

## 2013-04-25 MED ORDER — INSULIN GLARGINE 100 UNIT/ML ~~LOC~~ SOLN: SUBCUTANEOUS | Status: DC

## 2013-04-25 MED ORDER — LIRAGLUTIDE 18 MG/3ML ~~LOC~~ SOPN: 1.2000 mg | PEN_INJECTOR | Freq: Every day | SUBCUTANEOUS | Status: DC

## 2013-04-25 NOTE — Patient Instructions (Signed)
Please start Victoza injection under skin at 0.6 mg daily in am. In 5 days, please advance to 1.2 mg daily. Stop Januvia. Continue Lantus at 30 units at night. Continue Metformin at 1000 mg twice a day. Please return in 1 month with your sugar log. You can check 2-3 x a day.

## 2013-04-25 NOTE — Progress Notes (Signed)
Subjective:     Patient ID: [name], female   DOB: 1941/10/27, 72 y.o.   MRN: [id number]  HPI [name] is a 72 y/o woman returning DM 2, dx 1980, insulin-dependent, uncontrolled, without known complications. Last visit was ~ 1 mo ago. Her last HbA1c was 8.0% on 02/28/2013, previously 8.1% in 06/27/2012.   Had a UTI 1 mo ago >> hematuria >> ED >> Cipro 10 days >> tendonitis. Lost 5 lbs since last visit. Her sugars have been running high with this episode, and she had to increase her Lantus from 25 units to 30. At last visit, I reduced her Lantus from 30 to 25 units and she was developing lows in the 40s and 50s in a.m. She is on the following regimen: - Lantus 30 hs - Januvia 100 mg daily - Metformin 1000 mg bid  - At last visit we stopped Glucovance (glyburide-metformin) 09-999 mg twice a day. At that time, she was also taking an extra 500 mg of metformin daily.  She checks her sugars 4 times daily and she brings a detailed log with her. We reviewed her log together: - am: 70-107 >> 110-160 - Before lunch: 120-200 - Before dinner: 140-215  - Before bedtime: 150-200 She has a snack every night: PB crackers and milk. She does and that she takes is that she needs the protein nonacidic that she is hungry No lows. She has hypoglycemia awareness at 60.  She does not have diabetic retinopathy. Will have annual eye exam this  month - she has yearly eye exam. Denies numbness and tingling in her legs. She does not have chronic kidney disease.  I reviewed her chart. She also has a history of breast cancer - dx 2000; had chemotx then for 6 months, hypothyroidism, hypertension, osteoporosis, history of back surgeries, right hip pain, hiatal hernia with dysphagia.  Review of Systems Constitutional: no weight gain/loss, + fatigue, + subjective hypothermia, poor sleep+  Eyes: no blurry vision, no xerophthalmia ENT: no sore throat, no nodules palpated in throat, no dysphagia/odynophagia, no  hoarseness Cardiovascular: no CP/SOB/palpitations/leg swelling Respiratory: no cough/SOB Gastrointestinal: no N/V/D/C Musculoskeletal: + muscle/+ joint aches Skin: no rashes Neurological: no tremors/numbness/tingling/dizziness Psychiatric: no depression/anxiety  I reviewed pt's medications, allergies, PMH, social hx, family hx and no changes required.  Objective:   Physical Exam BP 130/70  Pulse 100  Temp(Src) 98.1 F (36.7 C) (Oral)  Resp 10  Ht 5\' 6"  (1.676 m)  Wt 200 lb (90.719 kg)  BMI 32.3 kg/m2  SpO2 97% Wt Readings from Last 3 Encounters:  04/25/13 200 lb (90.719 kg)  04/24/13 200 lb (90.719 kg)  04/16/13 202 lb (91.627 kg)  Constitutional: overweight, in NAD Eyes: PERRLA, EOMI, no exophthalmos ENT: moist mucous membranes, no thyromegaly, no cervical lymphadenopathy Cardiovascular: RRR, No MRG Respiratory: CTA B Gastrointestinal: abdomen soft, NT, ND, BS+ Musculoskeletal: no deformities, strength intact in all 4 Skin: moist, warm, no rashes, splitting nails on 2 fingers Neurological: Very mild tremor with outstretched hands, DTR normal in all 4    Assessment:     1. DM2, uncontrolled, insulin-dependent, without complications  2.  recent UTI  - Treated with Cipro >> developed tendinitis  - Resolved     Plan:     1. DM2 - patient was worsens in diabetes control after having a urinary tract infection and a reaction to ciprofloxacin (tendinitis). Her sugars are much higher than before and I don't think that this is due to decreasing the Lantus by  5 units, especially since she went back to her previous dose of Lantus to 30 units, with no significant  improvement in her sugars her blood sugars are in the morning.  - She is wondering whether Januvia can be a cause for her UTI which I doubt however it is clear the Januvia is not helping much, so we discussed about possible other treatment options one of the possibilities would be to go back to adding a sulfonylurea, but  the patient is not very enthusiastic about this. The other option would be to add Victoza, which is longer acting than Januvia. I don't think she is a candidate for Invokana, especially in the light of the new urinary tract infection. Bromocriptine is another option, but this might be expensive. Having mealtime insulin is always an option, but I feel that we did not exhaust other therapies yet. - The patient opted for adding Victoza injection once a day - was started this at low dose and advised to 1.2 mg daily - Continue metformin and Lantus at the current doses, but stop Januvia for now - No labs for today  - Return to clinic in one month with her log

## 2013-04-30 ENCOUNTER — Telehealth: Payer: Self-pay

## 2013-04-30 MED ORDER — CYANOCOBALAMIN 1000 MCG/ML IJ SOLN: 1000.0000 ug | Freq: Once | INTRAMUSCULAR | Status: DC

## 2013-04-30 NOTE — Telephone Encounter (Signed)
Rx for injection sent to [facility].

## 2013-04-30 NOTE — Telephone Encounter (Signed)
Pt said she wants to know if Dr Dayton Martes could refer pt somewhere to get Vit B 12 injection. [name] 2 Vit B 12 shots and can give an injection to pt if Dr orders.Please advise. Pt request call back.

## 2013-04-30 NOTE — Telephone Encounter (Signed)
Advised patient.  She will call [facility] and see if she can get the injection there.

## 2013-05-23 ENCOUNTER — Encounter: Payer: Self-pay | Admitting: Internal Medicine

## 2013-05-23 ENCOUNTER — Ambulatory Visit (INDEPENDENT_AMBULATORY_CARE_PROVIDER_SITE_OTHER): Payer: 59 | Admitting: Internal Medicine

## 2013-05-23 VITALS — BP 120/70 | HR 102 | Temp 98.2°F | Resp 10 | Ht 66.0 in | Wt 193.0 lb

## 2013-05-23 DIAGNOSIS — E119 Type 2 diabetes mellitus without complications: Secondary | ICD-10-CM

## 2013-05-23 DIAGNOSIS — T68XXXA Hypothermia, initial encounter: Secondary | ICD-10-CM | POA: Insufficient documentation

## 2013-05-23 DIAGNOSIS — Z5189 Encounter for other specified aftercare: Secondary | ICD-10-CM

## 2013-05-23 DIAGNOSIS — T68XXXD Hypothermia, subsequent encounter: Secondary | ICD-10-CM

## 2013-05-23 MED ORDER — LIRAGLUTIDE 18 MG/3ML ~~LOC~~ SOPN: 1.2000 mg | PEN_INJECTOR | Freq: Every day | SUBCUTANEOUS | Status: DC

## 2013-05-23 NOTE — Progress Notes (Signed)
Subjective:     Patient ID: [name], female   DOB: 1941/02/08, 72 y.o.   MRN: [id number]  HPI [name] is a 72 y/o woman returning DM 2, dx 1980, insulin-dependent, uncontrolled, without known complications. Last visit was 1 mo ago.   Her last HbA1c was; Lab Results  Component Value Date   HGBA1C 8.0* 02/28/2013  previously 8.1% in 06/27/2012.   She c/o feeling very cold (started before last visit, so before starting any new med), hands and feet hurt b/c cold, since the air conditioning on (keeps it on 59F). Reviewing her chart, last TSH was: Lab Results  Component Value Date   TSH 4.27 02/28/2013   She is on the following regimen: - Lantus 30 units qhs - Victoza 1.2 mg daily - started at last visit - tolerates it well, feels less hungry, lost 7 lbs since starting this - Metformin 1000 mg bid  We stopped Januvia 100 mg daily at last visit, and we also stopped Glucovance (glyburide-metformin) 09-999 mg bid at a previous visit.   She checks her sugars 4 times daily and she brings a detailed log with her. We reviewed her log together: - am: 70-107 >> 110-160 >> 71-140, mostly in the 90-110 range - Before lunch: 120-200 >> 90-140 - Before dinner: 140-215 >> 100-170 - Before bedtime: 150-200 >> can go to 200s occasionally, but mostly 120-170s No lows other than the 71 x 1 am. She has hypoglycemia awareness at 60.  She does not have diabetic retinopathy. She has yearly eye exams. Denies numbness and tingling in her legs. She does not have chronic kidney disease.  She also has a history of breast cancer - dx 2000; had chemotx then for 6 months, hypothyroidism, hypertension, osteoporosis, history of back surgeries, right hip pain, hiatal hernia with dysphagia.  Review of Systems Constitutional: +  weight loss, + fatigue, + subjective hypothermia. She urinates once a night, during the day she urinates more often. Eyes: no blurry vision, no xerophthalmia ENT: no sore throat, no  nodules palpated in throat, no dysphagia/odynophagia, no hoarseness Cardiovascular: no CP/SOB/palpitations/leg swelling Respiratory: no cough/SOB Gastrointestinal: no N/V/D/C Musculoskeletal: + muscle/no joint aches Skin: no rashes Neurological: no tremors/numbness/tingling/dizziness Psychiatric: no depression/anxiety  I reviewed pt's medications, allergies, PMH, social hx, family hx and no changes required.  Objective:   Physical Exam BP 120/70  Pulse 102  Temp(Src) 98.2 F (36.8 C) (Oral)  Resp 10  Ht 5\' 6"  (1.676 m)  Wt 193 lb (87.544 kg)  BMI 31.17 kg/m2  SpO2 98% Wt Readings from Last 3 Encounters:  05/23/13 193 lb (87.544 kg)  04/25/13 200 lb (90.719 kg)  04/24/13 200 lb (90.719 kg)  Constitutional: overweight, in NAD Eyes: PERRLA, EOMI, no exophthalmos ENT: moist mucous membranes, no thyromegaly, no cervical lymphadenopathy Cardiovascular: RRR, No MRG Respiratory: CTA B Gastrointestinal: abdomen soft, NT, ND, BS+ Musculoskeletal: no deformities, strength intact in all 4 Skin: moist, warm, no rashes Neurological: no tremor with outstretched hands, DTR normal in all 4    Assessment:     1. DM2, uncontrolled, insulin-dependent, without complications  2.  Subjective hypothermia - TSH normal 3 mo ago    Plan:     1. DM2 - patient with initially worsened diabetes control after having a urinary tract infection and a reaction to ciprofloxacin (tendinitis) 2 mo ago. Her sugars are much improved since last visit after addition of Victoza with cutting portions and losing weight. - I congratulated the patient for her success  with weight loss, and I be for now we should continue the current dose of Victoza, but we have room to go and increase it to 1.8 mg daily if we need to at next visit - Continue Victoza injection once a day 1.2 mg daily - refilled Victoza - Continue metformin and Lantus at the current doses - No labs for today  - Return to clinic in 2 months with her  log   2. Subjective hypothermia I offered to check a TSH, however she would want to wait until next visit with me or with Dr. Dayton Martes, whichever comes first `

## 2013-05-23 NOTE — Patient Instructions (Addendum)
Keep up the great work! Let's keep everything the same.

## 2013-06-04 ENCOUNTER — Other Ambulatory Visit: Payer: Self-pay | Admitting: *Deleted

## 2013-06-04 MED ORDER — CYANOCOBALAMIN 1000 MCG/ML IJ SOLN: 1000.0000 ug | Freq: Once | INTRAMUSCULAR | Status: DC

## 2013-07-16 ENCOUNTER — Other Ambulatory Visit: Payer: Self-pay | Admitting: *Deleted

## 2013-07-16 MED ORDER — EZETIMIBE 10 MG PO TABS: 10.0000 mg | ORAL_TABLET | Freq: Every day | ORAL | Status: DC

## 2013-07-31 ENCOUNTER — Other Ambulatory Visit: Payer: Self-pay | Admitting: *Deleted

## 2013-07-31 MED ORDER — VALSARTAN-HYDROCHLOROTHIAZIDE 160-25 MG PO TABS: 1.0000 | ORAL_TABLET | Freq: Every day | ORAL | Status: DC

## 2013-08-01 ENCOUNTER — Ambulatory Visit: Payer: 59 | Admitting: Family Medicine

## 2013-08-08 ENCOUNTER — Ambulatory Visit: Payer: 59 | Admitting: Internal Medicine

## 2013-08-08 ENCOUNTER — Ambulatory Visit (INDEPENDENT_AMBULATORY_CARE_PROVIDER_SITE_OTHER): Payer: 59 | Admitting: Internal Medicine

## 2013-08-08 ENCOUNTER — Encounter: Payer: Self-pay | Admitting: Internal Medicine

## 2013-08-08 VITALS — BP 118/78 | HR 104 | Temp 98.1°F | Resp 10 | Wt 189.0 lb

## 2013-08-08 DIAGNOSIS — E119 Type 2 diabetes mellitus without complications: Secondary | ICD-10-CM

## 2013-08-08 MED ORDER — LIRAGLUTIDE 18 MG/3ML ~~LOC~~ SOPN: 1.8000 mg | PEN_INJECTOR | Freq: Every day | SUBCUTANEOUS | Status: DC

## 2013-08-08 NOTE — Progress Notes (Signed)
Subjective:     Patient ID: [name], female   DOB: May 01, 1941, 72 y.o.   MRN: [id number]  HPI [name] is a 72 y/o woman returning DM 2, dx 1980, insulin-dependent, uncontrolled, without known complications. Last visit was 2 mo ago.   Her last HbA1c was; Lab Results  Component Value Date   HGBA1C 8.0* 02/28/2013  previously 74.1% in 06/27/2012.   She is on the following regimen: - Lantus 34 units (increased from 30 units since last visit) qhs - Victoza 1.8 mg daily (increased since last visit from 1.2 mg) - tolerates it well, feels less hungry, lost 7 lbs since starting this - Metformin 1000 mg bid  We stopped Januvia 100 mg daily and we also stopped Glucovance (glyburide-metformin) 09-999 mg bid at a previous visit.   She checks her sugars 4 times daily and she brings a detailed log with her. We reviewed her log together: - am: 70-107 >> 110-160 >> 71-140, mostly in the 90-110 range >> 91-136 - Before lunch: 120-200 >> 90-140 >> 92-225 (much better in last few days) - Before dinner: 140-215 >> 100-170 >> 74-151 - Before bedtime: 150-200 >> can go to 200s occasionally, but mostly 120-170s >> 95-158 No lows other than the 74 x 1. She has hypoglycemia awareness at 60.  She does not have diabetic retinopathy. She has yearly eye exams.  Denies numbness and tingling in her legs.  She does not have chronic kidney disease. He is on Valsartan.  She also has a history of breast cancer - dx 2000; had chemotx then for 6 months, hypothyroidism, hypertension, osteoporosis, history of back surgeries, right hip pain, hiatal hernia with dysphagia.  Review of Systems Constitutional: no  weight loss, + fatigue  (B12 helps).She urinates once a night, during the day she urinates more often. Eyes: no blurry vision, no xerophthalmia ENT: no sore throat, no nodules palpated in throat, no dysphagia/odynophagia, no hoarseness Cardiovascular: no CP/SOB/palpitations/+ leg swelling Respiratory: no  cough/SOB Gastrointestinal: no N/V/D/C Musculoskeletal: + muscle/+  joint aches Skin: no rashes, + hair loss. Neurological: no tremors/numbness/tingling/dizziness Psychiatric: no depression/anxiety  I reviewed pt's medications, allergies, PMH, social hx, family hx and no changes required.  Objective:   Physical Exam BP 118/78  Pulse 104  Temp(Src) 98.1 F (36.7 C) (Oral)  Resp 10  Wt 189 lb (85.73 kg)  BMI 30.52 kg/m2  SpO2 98% Wt Readings from Last 3 Encounters:  08/08/13 189 lb (85.73 kg)  05/23/13 193 lb (87.544 kg)  04/25/13 200 lb (90.719 kg)  Constitutional: overweight, in NAD Eyes: PERRLA, EOMI, no exophthalmos ENT: moist mucous membranes, no thyromegaly, no cervical lymphadenopathy Cardiovascular: RRR, No MRG Respiratory: CTA B Gastrointestinal: abdomen soft, NT, ND, BS+ Musculoskeletal: no deformities, strength intact in all 4 Skin: moist, warm, no rashes. Wears a wig.    Assessment:     1. DM2, uncontrolled, insulin-dependent, without complications    Plan:     1. DM2 - patient's sugars improved after addition of Victoza and cutting portions and losing weight, and sugars improved more after increasing Victoza to 1.8 in the last 2 weeks. - I congratulated the patient for her success with weight loss, and I be for now we should continue the current dose of Victoza - Continue Victoza injection once a day 1.8 mg daily - refilled Victoza - Continue metformin and Lantus at the current doses - Will check a HbA1C when she goes for the appt with Dr Dayton Martes in 6 days (order placed)  -  Return to clinic in 3 months with her log

## 2013-08-08 NOTE — Patient Instructions (Signed)
Please continue Lantus 34 units, Victoza 1.8 mg daily and Metformin 1000 mg 2x a day with a meal. We will get a HbA1C checked at the time of your appointment with Dr. Dayton Martes. Return to clinic in 3 months with your sugar log.

## 2013-08-14 ENCOUNTER — Encounter: Payer: Self-pay | Admitting: Family Medicine

## 2013-08-14 ENCOUNTER — Ambulatory Visit (INDEPENDENT_AMBULATORY_CARE_PROVIDER_SITE_OTHER): Payer: 59 | Admitting: Family Medicine

## 2013-08-14 VITALS — BP 130/68 | HR 84 | Temp 98.1°F | Ht 66.0 in | Wt 187.0 lb

## 2013-08-14 DIAGNOSIS — Z136 Encounter for screening for cardiovascular disorders: Secondary | ICD-10-CM

## 2013-08-14 DIAGNOSIS — E039 Hypothyroidism, unspecified: Secondary | ICD-10-CM

## 2013-08-14 DIAGNOSIS — I10 Essential (primary) hypertension: Secondary | ICD-10-CM

## 2013-08-14 DIAGNOSIS — E119 Type 2 diabetes mellitus without complications: Secondary | ICD-10-CM

## 2013-08-14 DIAGNOSIS — Z23 Encounter for immunization: Secondary | ICD-10-CM

## 2013-08-14 DIAGNOSIS — Z0000 Encounter for general adult medical examination without abnormal findings: Secondary | ICD-10-CM | POA: Insufficient documentation

## 2013-08-14 DIAGNOSIS — C50919 Malignant neoplasm of unspecified site of unspecified female breast: Secondary | ICD-10-CM

## 2013-08-14 LAB — BASIC METABOLIC PANEL
CO2: 28 mEq/L (ref 19–32)
Calcium: 9.2 mg/dL (ref 8.4–10.5)
GFR: 84.1 mL/min (ref >60.00)
Potassium: 3.7 mEq/L (ref 3.5–5.1)
Sodium: 131 mEq/L — ABNORMAL LOW (ref 135–145)

## 2013-08-14 LAB — LIPID PANEL
HDL: 44.6 mg/dL (ref >39.00)
Total CHOL/HDL Ratio: 4
VLDL: 19 mg/dL (ref 0.0–40.0)

## 2013-08-14 NOTE — Addendum Note (Signed)
Addended by: Eliezer Bottom on: 08/14/2013 11:55 AM   Modules accepted: Orders

## 2013-08-14 NOTE — Patient Instructions (Addendum)
We will call you with your lab results.  I will also send results to Dr. Elvera Lennox.  It was wonderful to see you.  Enjoy the weather next week.

## 2013-08-14 NOTE — Progress Notes (Signed)
72 yo with h/o breast CA, DM originally here for annual medicare wellness visit but wants follow up visit instead.   Lab Results  Component Value Date   CHOL 180 06/27/2012   HDL 40.00 06/27/2012   LDLCALC 111* 06/27/2012   TRIG 145.0 06/27/2012   CHOLHDL 5 06/27/2012    Hypothyroidism- denies any symptoms of hypo or hyperthyroidism. Lab Results  Component Value Date   TSH 4.27 02/28/2013     She does get yearly mammograms.  Up to date with her oncologist.  Colonoscopy UTD.  DM- Seeing Dr. Elvera Lennox.  Her sugars have improved after addition of Victoza and cutting portions and losing weight, and sugars improved more after increasing Victoza to 1.8.  She has also lost 15 pounds in past year.  Lab Results  Component Value Date   HGBA1C 8.0* 02/28/2013    BP- on Diovan- HCT. Well controlled today.   Denies any HA, burred vision or LE edema.      Patient Active Problem List   Diagnosis Date Noted  . Encounter for Medicare annual wellness exam 08/14/2013  . Hypothermia 05/23/2013  . Tendonitis 04/16/2013  . Bilateral leg pain 02/28/2013  . HEEL PAIN, RIGHT 11/24/2010  . HIATAL HERNIA 02/10/2010  . ALLERGY, ENVIRONMENTAL 04/29/2009  . SHOULDER PAIN, RIGHT 01/23/2009  . CARCINOMA, BREAST, RIGHT 04/26/2007  . HYPOTHYROIDISM 04/26/2007  . DIABETES MELLITUS, TYPE II 04/26/2007  . HYPERTENSION 04/26/2007   Past Medical History  Diagnosis Date  . Diabetes mellitus   . Hypertension   . Thyroid disease   . Osteoporosis    Past Surgical History  Procedure Laterality Date  . Mumps  1965    h/o  . Miscarriage  1970  . Septoplasty  1970  . Appendectomy    . Vaginal hysterectomy  1974    partial, fibroids  . Tonsillectomy  1945  . Foot surgery  1992 & 1994    bilateral  . Back surgery  1983    discectomy  . Ventral hernia repair    . 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 Colonoscopy    . Vaginal delivery      x5  . Other surgical history   04/03/02    dexa, wnl  . Other surgical history  03/12/04    dexa, wnl  . Doppler echocardiography  02/24/06    Triv MR, EF 55-60%  . Other surgical history  08/12/05    dexa-osteopor Hip, osteopen spine  . 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 Other surgical history  10/25/07    dexa- nml hip and spine   History  Substance Use Topics  . Smoking status: Never Smoker   . Smokeless tobacco: Not on file  . Alcohol Use: Not on file   Family History  Problem Relation Age of Onset  . Heart failure Father   . Hypertension Father   . Stroke Father   . Lung cancer Brother     hx of smoking  . Diabetes Mother   . Hypertension Mother   . Heart attack Mother   . Stroke Brother   . Heart attack Brother     PTCA MI x 2   Allergies  Allergen Reactions  . Ciprofloxacin     tendonitis  . Hydrocodone-Homatropine     REACTION: DROVE HER UP THE WALL  . Rosiglitazone Maleate     REACTION: SWELLING   Current Outpatient Prescriptions  on File Prior to Visit  Medication Sig Dispense Refill  . acetaminophen (TYLENOL ARTHRITIS PAIN) 650 MG CR tablet Take 1,300 mg by mouth every 8 (eight) hours as needed for pain.      Marland Kitchen aspirin 81 MG tablet Take 81 mg by mouth daily.        . Blood Glucose Monitoring Suppl (ACCU-CHEK AVIVA PLUS) W/DEVICE KIT Use to check blood sugar twice daily       . clobetasol ointment (TEMOVATE) 0.05 % Apply nightly to irritated area x 2 weeks.  30 g  0  . cyanocobalamin (,VITAMIN B-12,) 1000 MCG/ML injection Inject 1 mL (1,000 mcg total) into the muscle once.  1 mL  5  . diphenhydramine-acetaminophen (TYLENOL PM) 25-500 MG TABS Take 1/2 tablet by mouth at bedtime as needed       . ezetimibe (ZETIA) 10 MG tablet Take 1 tablet (10 mg total) by mouth daily.  30 tablet  1  . fluconazole (DIFLUCAN) 150 MG tablet       . glucose blood test strip (Accu-chek compact) Use as instructed  100 each  6  . guaiFENesin (MUCINEX) 600 MG 12 hr tablet as needed.        . insulin  glargine (LANTUS) 100 UNIT/ML injection Inject 30 units every night.  5 pen  3  . Insulin Pen Needle 32G X 4 MM MISC Use as directed  100 each  6  . Lancets (ACCU-CHEK MULTICLIX) lancets Use as instructed       . LASTACAFT 0.25 % SOLN       . Liraglutide (VICTOZA) 18 MG/3ML SOPN Inject 1.8 mg into the skin daily.  3 pen  11  . meloxicam (MOBIC) 7.5 MG tablet       . metFORMIN (GLUCOPHAGE) 1000 MG tablet Take 1 tablet (1,000 mg total) by mouth 2 (two) times daily with a meal.  60 tablet  6  . Multiple Vitamins-Minerals (CENTRUM SILVER PO) Take 1 tablet by mouth daily.        . naproxen sodium (ANAPROX) 220 MG tablet Take 220 mg by mouth 2 (two) times daily with a meal.      . omeprazole (PRILOSEC) 20 MG capsule Take 20 mg by mouth daily.      Marland Kitchen terconazole (TERAZOL 3) 0.8 % vaginal cream       . valsartan-hydrochlorothiazide (DIOVAN-HCT) 160-25 MG per tablet Take 1 tablet by mouth daily.  30 tablet  5  . vitamin C (ASCORBIC ACID) 500 MG tablet Take 500 mg by mouth 2 (two) times daily.         No current facility-administered medications on file prior to visit.     The PMH, PSH, Social History, Family History, Medications, and allergies have been reviewed in [facility], and have been updated if relevant.   Review of Systems       See HPI   Physical Exam BP 130/68  Pulse 84  Temp(Src) 98.1 F (36.7 C)  Ht 5\' 6"  (1.676 m)  Wt 187 lb (84.823 kg)  BMI 30.2 kg/m2 Wt Readings from Last 3 Encounters:  08/14/13 187 lb (84.823 kg)  08/08/13 189 lb (85.73 kg)  05/23/13 193 lb (87.544 kg)     General:  Well-developed,well-nourished,in no acute distress; alert,appropriate and cooperative throughout examination, mildly overweight. Head:  Normocephalic and atraumatic without obvious abnormalities. Wears wig due to past chemo. Eyes:  Conjunctiva clear bilaterally.  Ears:  External ear exam shows no significant lesions or deformities.  Otoscopic  examination reveals clear canals, tympanic membranes  are intact bilaterally without bulging, retraction, inflammation or discharge. Hearing is grossly normal bilaterally. Nose:  External nasal examination shows no deformity or inflammation. Nasal mucosa are pink and moist without lesions or exudates. Mouth:  Oral mucosa and oropharynx without lesions or exudates.  Teeth in good repair. Neck:  No deformities, masses, or tenderness noted. Lungs:  Normal respiratory effort, chest expands symmetrically. Lungs are clear to auscultation, no crackles or wheezes. Heart:  Normal rate and regular rhythm. S1 and S2 normal without gallop, murmur, click, rub or other extra sounds. Abdomen:  Bowel sounds positive,abdomen soft and non-tender without masses, organomegaly or hernias noted. Neurologic:  No cranial nerve deficits noted. Station and gait are normal. Plantar reflexes are down-going bilaterally. DTRs are symmetrical throughout. Sensory, motor and coordinative functions appear intact.  Assessment and Plan:  1. DIABETES MELLITUS, TYPE II Improved since she started seeing Dr. Elvera Lennox.  She is due for a1c.  Will check today and route to Dr. Elvera Lennox. - Hemoglobin A1c  2. CARCINOMA, BREAST, RIGHT Followed by onc yearly.  3. HYPERTENSION Well controlled.  No changes today. - Basic Metabolic Panel  4. HYPOTHYROIDISM Stable.     6. Screening for ischemic heart disease  - Lipid Panel

## 2013-08-28 ENCOUNTER — Other Ambulatory Visit (INDEPENDENT_AMBULATORY_CARE_PROVIDER_SITE_OTHER): Payer: Medicare Other

## 2013-08-28 DIAGNOSIS — I10 Essential (primary) hypertension: Secondary | ICD-10-CM

## 2013-08-28 LAB — BASIC METABOLIC PANEL
CO2: 28 mEq/L (ref 19–32)
Chloride: 96 mEq/L (ref 96–112)
Potassium: 4 mEq/L (ref 3.5–5.1)

## 2013-08-29 ENCOUNTER — Other Ambulatory Visit: Payer: Medicare Other

## 2013-08-30 ENCOUNTER — Ambulatory Visit (INDEPENDENT_AMBULATORY_CARE_PROVIDER_SITE_OTHER): Payer: Medicare Other | Admitting: Family Medicine

## 2013-08-30 ENCOUNTER — Encounter: Payer: Self-pay | Admitting: Family Medicine

## 2013-08-30 VITALS — BP 120/60 | HR 96 | Temp 98.0°F | Ht 66.0 in | Wt 187.0 lb

## 2013-08-30 DIAGNOSIS — N760 Acute vaginitis: Secondary | ICD-10-CM

## 2013-08-30 MED ORDER — CLOBETASOL PROPIONATE 0.05 % EX OINT: TOPICAL_OINTMENT | CUTANEOUS | Status: DC

## 2013-08-30 NOTE — Progress Notes (Signed)
SUBJECTIVE: [name] is a 72 y.o. female for persistent vaginal irritation. Dysuria- only when urine hits certain areas of vulva.  No back pain, fever, nausea or vomiting.  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 Ran out and was symptoms free until last few weeks.  Having vulvular irritation.  No dysuria.  No discharge.  No vaginal bleeding.    Patient Active Problem List   Diagnosis Date Noted  . Encounter for Medicare annual wellness exam 08/14/2013  . Hypothermia 05/23/2013  . Tendonitis 04/16/2013  . Bilateral leg pain 02/28/2013  . HEEL PAIN, RIGHT 11/24/2010  . HIATAL HERNIA 02/10/2010  . ALLERGY, ENVIRONMENTAL 04/29/2009  . SHOULDER PAIN, RIGHT 01/23/2009  . CARCINOMA, BREAST, RIGHT 04/26/2007  . HYPOTHYROIDISM 04/26/2007  . DIABETES MELLITUS, TYPE II 04/26/2007  . HYPERTENSION 04/26/2007   Past Medical History  Diagnosis Date  . Diabetes mellitus   . Hypertension   . Thyroid disease   . Osteoporosis    Past Surgical History  Procedure Laterality Date  . Mumps  1965    h/o  . Miscarriage  1970  . Septoplasty  1970  . Appendectomy    . Vaginal hysterectomy  1974    partial, fibroids  . Tonsillectomy  1945  . Foot surgery  1992 & 1994    bilateral  . Back surgery  1983    discectomy  . Ventral hernia repair    . 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 Colonoscopy    . Vaginal delivery      x5  . Other surgical history  04/03/02    dexa, wnl  . Other surgical history  03/12/04    dexa, wnl  . Doppler echocardiography  02/24/06    Triv MR, EF 55-60%  . Other surgical history  08/12/05    dexa-osteopor Hip, osteopen spine  . 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 Other surgical history  10/25/07    dexa- nml hip and spine   History  Substance Use Topics  . Smoking status: Never Smoker   . Smokeless tobacco: Not on file  . Alcohol Use: Not on file   Family History  Problem  Relation Age of Onset  . Heart failure Father   . Hypertension Father   . Stroke Father   . Lung cancer Brother     hx of smoking  . Diabetes Mother   . Hypertension Mother   . Heart attack Mother   . Stroke Brother   . Heart attack Brother     PTCA MI x 2   Allergies  Allergen Reactions  . Ciprofloxacin     tendonitis  . Hydrocodone-Homatropine     REACTION: DROVE HER UP THE WALL  . Rosiglitazone Maleate     REACTION: SWELLING   Current Outpatient Prescriptions on File Prior to Visit  Medication Sig Dispense Refill  . acetaminophen (TYLENOL ARTHRITIS PAIN) 650 MG CR tablet Take 1,300 mg by mouth every 8 (eight) hours as needed for pain.      Marland Kitchen aspirin 81 MG tablet Take 81 mg by mouth daily.        . Blood Glucose Monitoring Suppl (ACCU-CHEK AVIVA PLUS) W/DEVICE KIT Use to check blood sugar twice daily       . clobetasol ointment (TEMOVATE) 0.05 % Apply nightly to irritated area x 2 weeks.  30 g  0  .  cyanocobalamin (,VITAMIN B-12,) 1000 MCG/ML injection Inject 1 mL (1,000 mcg total) into the muscle once.  1 mL  5  . diphenhydramine-acetaminophen (TYLENOL PM) 25-500 MG TABS Take 1/2 tablet by mouth at bedtime as needed       . ezetimibe (ZETIA) 10 MG tablet Take 1 tablet (10 mg total) by mouth daily.  30 tablet  1  . glucose blood test strip (Accu-chek compact) Use as instructed  100 each  6  . guaiFENesin (MUCINEX) 600 MG 12 hr tablet as needed.        . insulin glargine (LANTUS) 100 UNIT/ML injection Inject 30 units every night.  5 pen  3  . Insulin Pen Needle 32G X 4 MM MISC Use as directed  100 each  6  . Lancets (ACCU-CHEK MULTICLIX) lancets Use as instructed       . LASTACAFT 0.25 % SOLN       . Liraglutide (VICTOZA) 18 MG/3ML SOPN Inject 1.8 mg into the skin daily.  3 pen  11  . meloxicam (MOBIC) 7.5 MG tablet       . metFORMIN (GLUCOPHAGE) 1000 MG tablet Take 1 tablet (1,000 mg total) by mouth 2 (two) times daily with a meal.  60 tablet  6  . Multiple Vitamins-Minerals  (CENTRUM SILVER PO) Take 1 tablet by mouth daily.        . naproxen sodium (ANAPROX) 220 MG tablet Take 220 mg by mouth 2 (two) times daily with a meal.      . omeprazole (PRILOSEC) 20 MG capsule Take 20 mg by mouth daily.      Marland Kitchen terconazole (TERAZOL 3) 0.8 % vaginal cream       . valsartan-hydrochlorothiazide (DIOVAN-HCT) 160-25 MG per tablet Take 1 tablet by mouth daily.  30 tablet  5  . vitamin C (ASCORBIC ACID) 500 MG tablet Take 500 mg by mouth 2 (two) times daily.         No current facility-administered medications on file prior to visit.   The PMH, PSH, Social History, Family History, Medications, and allergies have been reviewed in [facility], and have been updated if relevant.  OBJECTIVE: BP 120/60  Pulse 96  Temp(Src) 98 F (36.7 C)  Ht 5\' 6"  (1.676 m)  Wt 187 lb (84.823 kg)  BMI 30.2 kg/m2  Appears well, in no apparent distress.  Vital signs are normal. The abdomen is soft without tenderness, guarding, mass, rebound or organomegaly. No CVA tenderness or inguinal adenopathy noted.  GU:  Irritated vulva without visible discharge Psych:  Tearful and anxious  ASSESSMENT/PLAN:  1. Vaginitis and vulvovaginitis  lichen planus. Will treat with clobetasol 0.05% ointment nightly x 2 weeks. She will call me with an update.

## 2013-08-30 NOTE — Patient Instructions (Addendum)
Good to see you. Let's restart the clobetasol for the next 2 weeks. Call me with an update.  There were no signs of a yeast infection.

## 2013-09-05 ENCOUNTER — Telehealth: Payer: Self-pay

## 2013-09-05 NOTE — Telephone Encounter (Addendum)
Pt request lab results done on 08/28/13; result note said will discuss at office visit. Pt was seen 08/30/13 but did not discuss lab results. Pt will be going out of town early on 09/07/13 returning home 09/10/13. Pt request cb. Pt said she feels " her normal" ; pt feels tired all the time. Pt said she does not feel any worse than when last seen.

## 2013-09-06 NOTE — Telephone Encounter (Signed)
Patient notified

## 2013-09-06 NOTE — Telephone Encounter (Signed)
Sorry for the oversight.  Please apologize to pt for me. We checked a BMET on 9/16- kidney function looks good, electrolytes good- sodium just a little little low.  Ok to increase slightly in foods higher in sodium.

## 2013-09-10 ENCOUNTER — Telehealth: Payer: Self-pay | Admitting: Family Medicine

## 2013-09-10 ENCOUNTER — Other Ambulatory Visit: Payer: Self-pay | Admitting: Family Medicine

## 2013-09-10 MED ORDER — EZETIMIBE 10 MG PO TABS: 10.0000 mg | ORAL_TABLET | Freq: Every day | ORAL | Status: DC

## 2013-09-10 NOTE — Telephone Encounter (Signed)
Patient Information:  Caller Name: [name]  Phone: [phone]  Patient: [name], [name]  Gender: Female  DOB: June 24, 1941  Age: 72 Years  PCP: [name] [facility])  Office Follow Up:  Does the office need to follow up with this patient?: No  Instructions For The Office: N/A  RN Note:  Patient states she developed a flat, red rash on her right arm extending from right wrist to shoulder. Onset 09/09/13. States rash is "blotchy." States mild itching noted. Patient has history of right breast cancer and lymphedema in her right arm in 2000. Care advice given per guidelines. Call back parameters reviewed. Patient verbalizes understanding. RN recommended appt. Patient agreeable.  Symptoms  Reason For Call & Symptoms: Rash  Reviewed Health History In EMR: Yes  Reviewed Medications In EMR: Yes  Reviewed Allergies In EMR: Yes  Reviewed Surgeries / Procedures: Yes  Date of Onset of Symptoms: 09/09/2013  Guideline(s) Used:  Rash or Redness - Localized  Disposition Per Guideline:   Home Care  Reason For Disposition Reached:   Mild localized rash  Advice Given:  Hydrocortisone Cream for Itching:   Keep the cream in the refrigerator (Reason: it feels better if applied cold).  Avoid Scratching:  Try not to scratch. Cut your fingernails short.  Call Back If:  Rash spreads or becomes worse  Rash lasts longer than 1 week  You become worse.  Apply Cold to the Area:  Apply or soak in cold water for 20 minutes every 3 to 4 hours to reduce itching or pain.  RN Overrode Recommendation:  Make Appointment  Per Nursing Judgement  Appointment Scheduled:  09/11/2013 10:15:00 Appointment Scheduled Provider:  Roxy Manns [name])

## 2013-09-10 NOTE — Telephone Encounter (Signed)
Received faxed refill request from pharmacy. Refill sent to pharmacy electronically. 

## 2013-09-11 ENCOUNTER — Ambulatory Visit (INDEPENDENT_AMBULATORY_CARE_PROVIDER_SITE_OTHER): Payer: Medicare Other | Admitting: Family Medicine

## 2013-09-11 ENCOUNTER — Encounter: Payer: Self-pay | Admitting: Family Medicine

## 2013-09-11 VITALS — BP 142/78 | HR 101 | Temp 98.5°F | Ht 66.0 in | Wt 185.8 lb

## 2013-09-11 DIAGNOSIS — R21 Rash and other nonspecific skin eruption: Secondary | ICD-10-CM | POA: Insufficient documentation

## 2013-09-11 NOTE — Assessment & Plan Note (Signed)
On R upper arm Resembles dermatitis-perhaps allergic No signs of zoster or cellulitis inst to avoid colors/ fragrances / use otc cortisone cream prn  Update if not starting to improve in a week or if worsening

## 2013-09-11 NOTE — Patient Instructions (Addendum)
The rash on your arm looks like a mild irritation or allergic reaction  Continue to use over the counter cortisone cream and avoid color/ fragrance in products  If symptoms worsen or you develop blisters -let me know

## 2013-09-11 NOTE — Progress Notes (Signed)
Subjective:    Patient ID: [name], female    DOB: Jun 24, 1941, 72 y.o.   MRN: [id number]  HPI Here with a rash on her arm   Friday- went to the beach , ate dinner out and then that night she got chills (no fever)  Felt better each day after that -never felt great  Sunday able to eat bkfast-no appetite Began a rash on her arm that day      (this is the side of mastectomy and LN dissection) Is itchy Not painful or burning  She put a cortisone cream on it   Also stomach feels unsettled -no n/v/d  Does not know why  Feels a bit run down   Has been working on weight loss    Patient Active Problem List   Diagnosis Date Noted  . Vaginitis and vulvovaginitis 08/30/2013  . Encounter for Medicare annual wellness exam 08/14/2013  . Hypothermia 05/23/2013  . Tendonitis 04/16/2013  . Bilateral leg pain 02/28/2013  . HEEL PAIN, RIGHT 11/24/2010  . HIATAL HERNIA 02/10/2010  . ALLERGY, ENVIRONMENTAL 04/29/2009  . SHOULDER PAIN, RIGHT 01/23/2009  . CARCINOMA, BREAST, RIGHT 04/26/2007  . HYPOTHYROIDISM 04/26/2007  . DIABETES MELLITUS, TYPE II 04/26/2007  . HYPERTENSION 04/26/2007   Past Medical History  Diagnosis Date  . Diabetes mellitus   . Hypertension   . Thyroid disease   . Osteoporosis    Past Surgical History  Procedure Laterality Date  . Mumps  1965    h/o  . Miscarriage  1970  . Septoplasty  1970  . Appendectomy    . Vaginal hysterectomy  1974    partial, fibroids  . Tonsillectomy  1945  . Foot surgery  1992 & 1994    bilateral  . Back surgery  1983    discectomy  . Ventral hernia repair    . 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 Colonoscopy    . Vaginal delivery      x5  . Other surgical history  04/03/02    dexa, wnl  . Other surgical history  03/12/04    dexa, wnl  . Doppler echocardiography  02/24/06    Triv MR, EF 55-60%  . Other surgical history  08/12/05    dexa-osteopor Hip, osteopen spine  . Cartoid u/s  02/24/06     60 -79% LICA stenosis  . Other surgical history  10/25/07    dexa- nml hip and spine   History  Substance Use Topics  . Smoking status: Never Smoker   . Smokeless tobacco: Not on file  . Alcohol Use: Not on file   Family History  Problem Relation Age of Onset  . Heart failure Father   . Hypertension Father   . Stroke Father   . Lung cancer Brother     hx of smoking  . Diabetes Mother   . Hypertension Mother   . Heart attack Mother   . Stroke Brother   . Heart attack Brother     PTCA MI x 2   Allergies  Allergen Reactions  . Ciprofloxacin     tendonitis  . Hydrocodone-Homatropine     REACTION: DROVE HER UP THE WALL  . Rosiglitazone Maleate     REACTION: SWELLING   Current Outpatient Prescriptions on File Prior to Visit  Medication Sig Dispense Refill  . acetaminophen (TYLENOL ARTHRITIS PAIN) 650 MG CR tablet Take 1,300 mg by mouth every 8 (eight) hours as needed for  pain.      . aspirin 81 MG tablet Take 81 mg by mouth daily.        . Blood Glucose Monitoring Suppl (ACCU-CHEK AVIVA PLUS) W/DEVICE KIT Use to check blood sugar twice daily       . clobetasol ointment (TEMOVATE) 0.05 % Apply nightly to irritated area x 2 weeks.  30 g  0  . cyanocobalamin (,VITAMIN B-12,) 1000 MCG/ML injection Inject 1 mL (1,000 mcg total) into the muscle once.  1 mL  5  . diphenhydramine-acetaminophen (TYLENOL PM) 25-500 MG TABS Take 1/2 tablet by mouth at bedtime as needed       . ezetimibe (ZETIA) 10 MG tablet Take 1 tablet (10 mg total) by mouth daily.  30 tablet  5  . glucose blood test strip (Accu-chek compact) Use as instructed  100 each  6  . guaiFENesin (MUCINEX) 600 MG 12 hr tablet as needed.        . insulin glargine (LANTUS) 100 UNIT/ML injection Inject 30 units every night.  5 pen  3  . Insulin Pen Needle 32G X 4 MM MISC Use as directed  100 each  6  . Lancets (ACCU-CHEK MULTICLIX) lancets Use as instructed       . LASTACAFT 0.25 % SOLN       . Liraglutide (VICTOZA) 18 MG/3ML  SOPN Inject 1.8 mg into the skin daily.  3 pen  11  . meloxicam (MOBIC) 7.5 MG tablet       . metFORMIN (GLUCOPHAGE) 1000 MG tablet Take 1 tablet (1,000 mg total) by mouth 2 (two) times daily with a meal.  60 tablet  6  . Multiple Vitamins-Minerals (CENTRUM SILVER PO) Take 1 tablet by mouth daily.        . naproxen sodium (ANAPROX) 220 MG tablet Take 220 mg by mouth 2 (two) times daily with a meal.      . omeprazole (PRILOSEC) 20 MG capsule Take 20 mg by mouth daily.      . valsartan-hydrochlorothiazide (DIOVAN-HCT) 160-25 MG per tablet Take 1 tablet by mouth daily.  30 tablet  5  . vitamin C (ASCORBIC ACID) 500 MG tablet Take 500 mg by mouth 2 (two) times daily.         No current facility-administered medications on file prior to visit.    Review of Systems Review of Systems  Constitutional: Negative for fever, appetite change,  and unexpected weight change. pos for fatigue Eyes: Negative for pain and visual disturbance.  Respiratory: Negative for cough and shortness of breath.   Cardiovascular: Negative for cp or palpitations    Gastrointestinal: Negative for  diarrhea and constipation. pos for nausea this am which is better now  Genitourinary: Negative for urgency and frequency.  Skin: Negative for pallor and pos for rash with mild itching  Neurological: Negative for weakness, light-headedness, numbness and headaches.  Hematological: Negative for adenopathy. Does not bruise/bleed easily.  Psychiatric/Behavioral: Negative for dysphoric mood. The patient is not nervous/anxious.         Objective:   Physical Exam  Constitutional: She appears well-developed and well-nourished. No distress.  overwt and well appearing   HENT:  Head: Normocephalic and atraumatic.  Eyes: Conjunctivae and EOM are normal. Pupils are equal, round, and reactive to light.  Neck: Normal range of motion. Neck supple.  Cardiovascular: Normal rate and regular rhythm.   Pulmonary/Chest: Effort normal and breath  sounds normal.  Musculoskeletal: She exhibits no edema.  Lymphadenopathy:  She has no cervical adenopathy.  Neurological: She is alert. She has normal reflexes. No cranial nerve deficit.  Skin: Skin is warm. Rash noted.  Very mild/ faint pink/ dry area on upper R arm - few papules-no vesicles or oozing or tenderness No excoriations  Psychiatric: Her speech is normal. Her mood appears anxious.          Assessment & Plan:

## 2013-10-26 ENCOUNTER — Other Ambulatory Visit: Payer: Self-pay | Admitting: *Deleted

## 2013-10-26 MED ORDER — INSULIN PEN NEEDLE 32G X 4 MM MISC: Status: DC

## 2013-11-07 ENCOUNTER — Ambulatory Visit (INDEPENDENT_AMBULATORY_CARE_PROVIDER_SITE_OTHER): Payer: Medicare Other | Admitting: Internal Medicine

## 2013-11-07 ENCOUNTER — Encounter: Payer: Self-pay | Admitting: Internal Medicine

## 2013-11-07 VITALS — BP 128/82 | HR 93 | Temp 98.0°F | Resp 10 | Wt 183.4 lb

## 2013-11-07 DIAGNOSIS — E119 Type 2 diabetes mellitus without complications: Secondary | ICD-10-CM

## 2013-11-07 MED ORDER — METFORMIN HCL 1000 MG PO TABS: 1000.0000 mg | ORAL_TABLET | Freq: Two times a day (BID) | ORAL | Status: DC

## 2013-11-07 NOTE — Progress Notes (Addendum)
Subjective:     Patient ID: [name], female   DOB: February 27, 1941, 72 y.o.   MRN: [id number]  HPI [name] is a 72 y/o woman returning DM 2, dx 1980, insulin-dependent, uncontrolled, without known complications. Last visit was 3 mo ago.   Her last HbA1c was:  Lab Results  Component Value Date   HGBA1C 6.7* 08/14/2013   HGBA1C 8.0* 02/28/2013   HGBA1C 8.1* 06/27/2012   She is on the following regimen: - Lantus now 26 << 34 units qhs - Victoza 1.8 mg daily - tolerates it well - Metformin 1000 mg bid  We stopped Januvia 100 mg daily and we also stopped Glucovance (glyburide-metformin) 09-999 mg bid at a previous visit.   She checks her sugars 1-2 times daily and she brings a detailed log with her. We reviewed her log together: - am: 70-107 >> 110-160 >> 71-140, mostly in the 90-110 range >> 91-136 >> 60-110 - Before lunch: 120-200 >> 90-140 >> 92-225 (much better in last few days) >> 126-135 - Before dinner: 140-215 >> 100-170 >> 74-151 >> 157 - Before bedtime: 150-200 >> can go to 200s occasionally, but mostly 120-170s >> 95-158 >> 116 No lows other than the 44 x 1 while on 28 units on Lantus. She has hypoglycemia awareness at 60.  She does not have diabetic retinopathy. She has yearly eye exams.  Denies numbness and tingling in her legs.  She does not have chronic kidney disease. Last BUN/Cr: Lab Results  Component Value Date   BUN 17 08/28/2013   CREATININE 0.8 08/28/2013  She is on Valsartan.  Last lipids: Lab Results  Component Value Date   CHOL 193 08/14/2013   HDL 44.60 08/14/2013   LDLCALC 129* 08/14/2013   TRIG 95.0 08/14/2013   CHOLHDL 4 08/14/2013  She is on Zetia.  She also has a history of breast cancer - dx 2000; had chemotx then for 6 months, hypothyroidism, hypertension, osteoporosis, history of back surgeries, right hip pain, hiatal hernia with dysphagia. She is on B12 injections. Last TSH: Lab Results  Component Value Date   TSH 4.27 02/28/2013    Review of  Systems Constitutional: no  weight loss, + fatigue  (B12 helps). She urinates once a night, during the day she urinates more often. C/o feeling very cold.  Eyes: no blurry vision, no xerophthalmia ENT: no sore throat, no nodules palpated in throat, no dysphagia/odynophagia, no hoarseness Cardiovascular: no CP/SOB/palpitations/+ leg swelling Respiratory: no cough/SOB Gastrointestinal: no N/V/D/C Musculoskeletal: + muscle/+  joint aches Skin: no rashes  I reviewed pt's medications, allergies, PMH, social hx, family hx and no changes required.  Objective:   Physical Exam BP 128/82  Pulse 93  Temp(Src) 98 F (36.7 C) (Oral)  Resp 10  Wt 183 lb 6.4 oz (83.19 kg)  SpO2 98% Wt Readings from Last 3 Encounters:  11/07/13 183 lb 6.4 oz (83.19 kg)  09/11/13 185 lb 12 oz (84.256 kg)  08/30/13 187 lb (84.823 kg)  Constitutional: overweight, in NAD Eyes: PERRLA, EOMI, no exophthalmos ENT: moist mucous membranes, no thyromegaly, no cervical lymphadenopathy Cardiovascular: RRR, No MRG Respiratory: CTA B Gastrointestinal: abdomen soft, NT, ND, BS+ Musculoskeletal: no deformities, strength intact in all 4 Skin: moist, warm, no rashes. Wears a wig.    Assessment:     1. DM2, uncontrolled, insulin-dependent, without complications    Plan:     1. DM2 - patient's sugars improved dramatically after addition of Victoza and cutting portions and losing weight, and  sugars improved more after increasing Victoza to 1.8, so that last HbA1c was <7%! - I congratulated the patient for her success with weight loss, and I be for now we should continue the current dose of Victoza - Continue Victoza injection once a day 1.8 mg daily - refilled metformin - Continue metformin and Lantus at the current doses - Will check a HbA1C in 10 days - Return to clinic in 3 months with her log   2. Hypothyroidism - pt c/o feeling excessively cold - last TSH on the high side of normal - check TFTs in 10  days  11/19/2013 Lab Results  Component Value Date   TSH 0.73 11/19/2013   FREET4 0.96 11/19/2013   Lab Results  Component Value Date   HGBA1C 6.3 11/19/2013   Excellent results! HbA1c decreased even more. TFTs normal.

## 2013-11-07 NOTE — Patient Instructions (Addendum)
Please consider decreasing the Lantus to 20 units at night. Continue Metformin and Victoza. Please return in 3 months with your sugar log.  Please return in 10 days for labs.

## 2013-11-15 ENCOUNTER — Other Ambulatory Visit: Payer: Self-pay | Admitting: *Deleted

## 2013-11-15 MED ORDER — GLUCOSE BLOOD VI STRP: ORAL_STRIP | Status: DC

## 2013-11-19 ENCOUNTER — Other Ambulatory Visit (INDEPENDENT_AMBULATORY_CARE_PROVIDER_SITE_OTHER): Payer: Medicare Other

## 2013-11-19 DIAGNOSIS — E119 Type 2 diabetes mellitus without complications: Secondary | ICD-10-CM

## 2013-11-19 LAB — HEMOGLOBIN A1C: Hgb A1c MFr Bld: 6.3 % (ref 4.6–6.5)

## 2013-11-22 ENCOUNTER — Encounter: Payer: Self-pay | Admitting: Internal Medicine

## 2013-11-22 ENCOUNTER — Ambulatory Visit (INDEPENDENT_AMBULATORY_CARE_PROVIDER_SITE_OTHER): Payer: Medicare Other | Admitting: Internal Medicine

## 2013-11-22 VITALS — BP 128/68 | HR 98 | Temp 98.1°F | Ht 66.0 in | Wt 183.8 lb

## 2013-11-22 DIAGNOSIS — S2092XA Blister (nonthermal) of unspecified parts of thorax, initial encounter: Secondary | ICD-10-CM

## 2013-11-22 DIAGNOSIS — S30820A Blister (nonthermal) of lower back and pelvis, initial encounter: Secondary | ICD-10-CM

## 2013-11-22 NOTE — Progress Notes (Signed)
Pre-visit discussion using our clinic review tool. No additional management support is needed unless otherwise documented below in the visit note.  

## 2013-11-22 NOTE — Progress Notes (Signed)
Subjective:    Patient ID: [name], female    DOB: 1941/10/20, 72 y.o.   MRN: [id number]  HPI  Pt presents to the clinic today with c/o skin check. She has a place on the back of her left thigh. It does irritate her at times. She first noticed this about 2 weeks ago. It does not itch or burn. She has not put anything on it. She has never had anything like this in the past.  Review of Systems      Past Medical History  Diagnosis Date  . Diabetes mellitus   . Hypertension   . Thyroid disease   . Osteoporosis     Current Outpatient Prescriptions  Medication Sig Dispense Refill  . acetaminophen (TYLENOL ARTHRITIS PAIN) 650 MG CR tablet Take 1,300 mg by mouth every 8 (eight) hours as needed for pain.      Marland Kitchen aspirin 81 MG tablet Take 81 mg by mouth daily.        . Blood Glucose Monitoring Suppl (ACCU-CHEK AVIVA PLUS) W/DEVICE KIT Use to check blood sugar twice daily       . clobetasol ointment (TEMOVATE) 0.05 % Apply nightly to irritated area x 2 weeks.  30 g  0  . cyanocobalamin (,VITAMIN B-12,) 1000 MCG/ML injection Inject 1 mL (1,000 mcg total) into the muscle once.  1 mL  5  . diphenhydramine-acetaminophen (TYLENOL PM) 25-500 MG TABS Take 1/2 tablet by mouth at bedtime as needed       . ezetimibe (ZETIA) 10 MG tablet Take 1 tablet (10 mg total) by mouth daily.  30 tablet  5  . glucose blood (ACCU-CHEK AVIVA PLUS) test strip Use as instructed to test two times a day or as directed. Dx code 250.00  100 each  12  . guaiFENesin (MUCINEX) 600 MG 12 hr tablet as needed.        . insulin glargine (LANTUS) 100 UNIT/ML injection Inject 26 Units into the skin at bedtime.      . Insulin Pen Needle 32G X 4 MM MISC Use to inject insulin daily.  Dx: 250.00  100 each  5  . Lancets (ACCU-CHEK MULTICLIX) lancets Use as instructed       . LASTACAFT 0.25 % SOLN       . Liraglutide (VICTOZA) 18 MG/3ML SOPN Inject 1.8 mg into the skin daily.  3 pen  11  . meloxicam (MOBIC) 7.5 MG tablet       .  metFORMIN (GLUCOPHAGE) 1000 MG tablet Take 1 tablet (1,000 mg total) by mouth 2 (two) times daily with a meal.  180 tablet  3  . Multiple Vitamins-Minerals (CENTRUM SILVER PO) Take 1 tablet by mouth daily.        . naproxen sodium (ANAPROX) 220 MG tablet Take 220 mg by mouth 2 (two) times daily with a meal.      . omeprazole (PRILOSEC) 20 MG capsule Take 20 mg by mouth daily.      . valsartan-hydrochlorothiazide (DIOVAN-HCT) 160-25 MG per tablet Take 1 tablet by mouth daily.  30 tablet  5  . vitamin C (ASCORBIC ACID) 500 MG tablet Take 500 mg by mouth 2 (two) times daily.         No current facility-administered medications for this visit.    Allergies  Allergen Reactions  . Ciprofloxacin     tendonitis  . Hydrocodone-Homatropine     REACTION: DROVE HER UP THE WALL  . Rosiglitazone Maleate  REACTION: SWELLING    Family History  Problem Relation Age of Onset  . Heart failure Father   . Hypertension Father   . Stroke Father   . Lung cancer Brother     hx of smoking  . Diabetes Mother   . Hypertension Mother   . Heart attack Mother   . Stroke Brother   . Heart attack Brother     PTCA MI x 2    History   Social History  . Marital Status: Married    Spouse Name: N/A    Number of Children: 5  . Years of Education: N/A   Occupational History  . Retired Runner, broadcasting/film/video    Social History Main Topics  . Smoking status: Never Smoker   . Smokeless tobacco: Not on file  . Alcohol Use: Not on file  . Drug Use: Not on file  . Sexual Activity: Not on file   Other Topics Concern  . Not on file   Social History Narrative   Married 50th anniversary (8/08)           Constitutional: Denies fever, malaise, fatigue, headache or abrupt weight changes.   Skin: Denies redness, rashes, lesions.    No other specific complaints in a complete review of systems (except as listed in HPI above).  Objective:   Physical Exam   BP 128/68  Pulse 98  Temp(Src) 98.1 F (36.7 C) (Oral)   Ht 5\' 6"  (1.676 m)  Wt 183 lb 12 oz (83.348 kg)  BMI 29.67 kg/m2  SpO2 96% Wt Readings from Last 3 Encounters:  11/22/13 183 lb 12 oz (83.348 kg)  11/07/13 183 lb 6.4 oz (83.19 kg)  09/11/13 185 lb 12 oz (84.256 kg)    General: Appears her stated age, well developed, well nourished in NAD. Skin: Warm, dry and intact. Small dime sized scabbed over blister noted on base of left buttock. Cardiovascular: Normal rate and rhythm. S1,S2 noted.  No murmur, rubs or gallops noted. No JVD or BLE edema. No carotid bruits noted. Pulmonary/Chest: Normal effort and positive vesicular breath sounds. No respiratory distress. No wheezes, rales or ronchi noted.   BMET    Component Value Date/Time   NA 132* 08/28/2013 1029   NA 134 12/02/2008 1107   K 4.0 08/28/2013 1029   K 4.1 12/02/2008 1107   CL 96 08/28/2013 1029   CL 95* 12/02/2008 1107   CO2 28 08/28/2013 1029   CO2 27 12/02/2008 1107   GLUCOSE 218* 08/28/2013 1029   GLUCOSE 145* 12/02/2008 1107   BUN 17 08/28/2013 1029   BUN 11 12/02/2008 1107   CREATININE 0.8 08/28/2013 1029   CREATININE 0.8 12/02/2008 1107   CALCIUM 8.9 08/28/2013 1029   CALCIUM 9.4 12/02/2008 1107   GFRNONAA 89.08 08/06/2010 0838   GFRAA 107 07/30/2008 1059    Lipid Panel     Component Value Date/Time   CHOL 193 08/14/2013 1034   TRIG 95.0 08/14/2013 1034   HDL 44.60 08/14/2013 1034   CHOLHDL 4 08/14/2013 1034   VLDL 19.0 08/14/2013 1034   LDLCALC 129* 08/14/2013 1034    CBC    Component Value Date/Time   WBC 8.2 02/28/2013 0916   WBC 9.4 12/02/2008 1107   RBC 4.35 02/28/2013 0916   HGB 12.5 02/28/2013 0916   HGB 13.1 12/02/2008 1107   HCT 37.2 02/28/2013 0916   HCT 39.6 12/02/2008 1107   PLT 398.0 02/28/2013 0916   PLT 330 12/02/2008 1107   MCV 85.6  02/28/2013 0916   MCV 83 12/02/2008 1107   MCH 27.5 12/02/2008 1107   MCHC 33.7 02/28/2013 0916   MCHC 33.1 12/02/2008 1107   RDW 13.6 02/28/2013 0916   RDW 13.1 12/02/2008 1107   LYMPHSABS 1.6 02/28/2013 0916   LYMPHSABS  1.8 12/02/2008 1107   MONOABS 0.7 02/28/2013 0916   EOSABS 0.2 02/28/2013 0916   EOSABS 0.2 12/02/2008 1107   BASOSABS 0.0 02/28/2013 0916   BASOSABS 0.1 12/02/2008 1107    Hgb A1C Lab Results  Component Value Date   HGBA1C 6.3 11/19/2013        Assessment & Plan:   Healing blister on base of left buttock:  She likely burned it by using a heating pad that was too hot Put triple antibiotic ointment on it and cover with a bandaid Changed daily until healed  RTC as needed

## 2013-11-22 NOTE — Patient Instructions (Signed)
Blisters Blisters are fluid-filled sacs that form within the skin. Common causes of blistering are friction, burns, and exposure to irritating chemicals. The fluid in the blister protects the underlying damaged skin. Most of the time it is not recommended that you open blisters. When a blister is opened, there is an increased chance for infection. Usually, a blister will open on its own. They then dry up and peel off within 10 days. If the blister is tense and uncomfortable (painful) the fluid may be drained. If it is drained the roof of the blister should be left intact. The draining should only be done by a medical professional under aseptic conditions. Poorly fitting shoes and boots can cause blisters by being too tight or too loose. Wearing extra socks or using tape, bandages, or pads over the blister-prone area helps prevent the problem by reducing friction. Blisters heal more slowly if you have diabetes or if you have problems with your circulation. You need to be careful about medical follow-up to prevent infection. HOME CARE INSTRUCTIONS  Protect areas where blisters have formed until the skin is healed. Use a special bandage with a hole cut in the middle around the blister. This reduces pressure and friction. When the blister breaks, trim off the loose skin and keep the area clean by washing it with soap daily. Soaking the blister or broken-open blister with diluted vinegar twice daily for 15 minutes will dry it up and speed the healing. Use 3 tablespoons of white vinegar per quart of water (45 mL white vinegar per liter of water). An antibiotic ointment and a bandage can be used to cover the area after soaking.  SEEK MEDICAL CARE IF:   You develop increased redness, pain, swelling, or drainage in the blistered area.  You develop a pus-like discharge from the blistered area, chills, or a fever. MAKE SURE YOU:   Understand these instructions.  Will watch your condition.  Will get help right  away if you are not doing well or get worse. Document Released: 01/06/2005 Document Revised: 02/21/2012 Document Reviewed: 12/04/2008 [employer] Patient Information 2014 [employer].  

## 2013-11-26 ENCOUNTER — Encounter: Payer: Self-pay | Admitting: Internal Medicine

## 2013-11-26 ENCOUNTER — Ambulatory Visit (INDEPENDENT_AMBULATORY_CARE_PROVIDER_SITE_OTHER): Payer: Medicare Other | Admitting: Internal Medicine

## 2013-11-26 VITALS — BP 132/62 | HR 94 | Temp 98.3°F | Ht 66.0 in

## 2013-11-26 DIAGNOSIS — B379 Candidiasis, unspecified: Secondary | ICD-10-CM

## 2013-11-26 DIAGNOSIS — J069 Acute upper respiratory infection, unspecified: Secondary | ICD-10-CM

## 2013-11-26 MED ORDER — FLUCONAZOLE 150 MG PO TABS: 150.0000 mg | ORAL_TABLET | Freq: Once | ORAL | Status: DC

## 2013-11-26 MED ORDER — AZITHROMYCIN 250 MG PO TABS: ORAL_TABLET | ORAL | Status: DC

## 2013-11-26 NOTE — Patient Instructions (Signed)

## 2013-11-26 NOTE — Progress Notes (Signed)
HPI  Pt presents to the clinic today with c/o chest tightness, cough and shortness of breath. This started 3 days ago. She has had fever, chills and body aches. The cough is productive of thick yellow/green sputum. She has taken Mucinex, Nyquil and tylenol without much relief. She has no history of allergies or asthma. She has had sick contacts. She does not smoke.  Review of Systems      Past Medical History  Diagnosis Date  . Diabetes mellitus   . Hypertension   . Thyroid disease   . Osteoporosis     Family History  Problem Relation Age of Onset  . Heart failure Father   . Hypertension Father   . Stroke Father   . Lung cancer Brother     hx of smoking  . Diabetes Mother   . Hypertension Mother   . Heart attack Mother   . Stroke Brother   . Heart attack Brother     PTCA MI x 2    History   Social History  . Marital Status: Married    Spouse Name: N/A    Number of Children: 5  . Years of Education: N/A   Occupational History  . Retired Runner, broadcasting/film/video    Social History Main Topics  . Smoking status: Never Smoker   . Smokeless tobacco: Not on file  . Alcohol Use: Not on file  . Drug Use: Not on file  . Sexual Activity: Not on file   Other Topics Concern  . Not on file   Social History Narrative   Married 50th anniversary (8/08)          Allergies  Allergen Reactions  . Ciprofloxacin     tendonitis  . Hydrocodone-Homatropine     REACTION: DROVE HER UP THE WALL  . Rosiglitazone Maleate     REACTION: SWELLING     Constitutional: Positive headache, fatigue and fever. Denies abrupt weight changes.  HEENT:  Positive sore throat. Denies eye redness, eye pain, pressure behind the eyes, facial pain, nasal congestion, ear pain, ringing in the ears, wax buildup, runny nose or bloody nose. Respiratory: Positive cough. Denies difficulty breathing or shortness of breath.  Cardiovascular: Denies chest pain, chest tightness, palpitations or swelling in the hands or  feet.   No other specific complaints in a complete review of systems (except as listed in HPI above).  Objective:   BP 132/62  Pulse 94  Temp(Src) 98.3 F (36.8 C) (Oral)  Ht 5\' 6"  (1.676 m)  SpO2 97%  PF 181 L/min Wt Readings from Last 3 Encounters:  11/22/13 183 lb 12 oz (83.348 kg)  11/07/13 183 lb 6.4 oz (83.19 kg)  09/11/13 185 lb 12 oz (84.256 kg)     General: Appears her stated age, well developed, well nourished in NAD. HEENT: Head: normal shape and size; Eyes: sclera white, no icterus, conjunctiva pink, PERRLA and EOMs intact; Ears: Tm's gray and intact, normal light reflex; Nose: mucosa pink and moist, septum midline; Throat/Mouth: + PND. Teeth present, mucosa erythematous and moist, no exudate noted, no lesions or ulcerations noted.  Neck: Mild cervical lymphadenopathy. Neck supple, trachea midline. No massses, lumps or thyromegaly present.  Cardiovascular: Normal rate and rhythm. S1,S2 noted.  No murmur, rubs or gallops noted. No JVD or BLE edema. No carotid bruits noted. Pulmonary/Chest: Normal effort and positive vesicular breath sounds. No respiratory distress. No wheezes, rales or ronchi noted.      Assessment & Plan:   Upper Respiratory Infection  Get some rest and drink plenty of water Do salt water gargles for the sore throat eRx for Azithromax x 5 days Delsym cough syrup  RTC as needed or if symptoms persist.

## 2013-11-26 NOTE — Progress Notes (Signed)
Pre-visit discussion using our clinic review tool. No additional management support is needed unless otherwise documented below in the visit note.  

## 2013-11-27 ENCOUNTER — Other Ambulatory Visit: Payer: Self-pay | Admitting: *Deleted

## 2013-11-27 DIAGNOSIS — E119 Type 2 diabetes mellitus without complications: Secondary | ICD-10-CM

## 2013-11-27 MED ORDER — LIRAGLUTIDE 18 MG/3ML ~~LOC~~ SOPN: 1.8000 mg | PEN_INJECTOR | Freq: Every day | SUBCUTANEOUS | Status: DC

## 2013-11-27 MED ORDER — INSULIN GLARGINE 100 UNIT/ML ~~LOC~~ SOLN: 20.0000 [IU] | Freq: Every day | SUBCUTANEOUS | Status: DC

## 2013-11-27 NOTE — Telephone Encounter (Signed)
Received these refill request from pharmacy. Pharmacy faxes have different dosing instructions. Lantus Solostar 100U/ml says to inject 30 units every night, and Victoza Pen 6mg /ml  inject 1.2 mg daily.

## 2013-11-28 ENCOUNTER — Emergency Department: Payer: Self-pay | Admitting: Emergency Medicine

## 2013-11-28 ENCOUNTER — Telehealth: Payer: Self-pay | Admitting: Family Medicine

## 2013-11-28 LAB — COMPREHENSIVE METABOLIC PANEL
Alkaline Phosphatase: 55 U/L
BUN: 14 mg/dL (ref 7–18)
Bilirubin,Total: 0.5 mg/dL (ref 0.2–1.0)
Calcium, Total: 9.3 mg/dL (ref 8.5–10.1)
EGFR (Non-African Amer.): >60
Osmolality: 249 (ref 275–301)
Potassium: 3.5 mmol/L (ref 3.5–5.1)
SGPT (ALT): 34 U/L (ref 12–78)

## 2013-11-28 LAB — CBC WITH DIFFERENTIAL/PLATELET
Basophil #: 0 10*3/uL (ref 0.0–0.1)
Eosinophil #: 0 10*3/uL (ref 0.0–0.7)
Eosinophil %: 0.2 %
HCT: 39.5 % (ref 35.0–47.0)
Lymphocyte #: 1.2 10*3/uL (ref 1.0–3.6)
MCV: 85 fL (ref 80–100)
Monocyte #: 0.8 x10 3/mm (ref 0.2–0.9)
Monocyte %: 15.9 %
Neutrophil %: 59.6 %
Platelet: 359 10*3/uL (ref 150–440)
RBC: 4.66 10*6/uL (ref 3.80–5.20)
WBC: 4.8 10*3/uL (ref 3.6–11.0)

## 2013-11-28 LAB — URINALYSIS, COMPLETE
Bilirubin,UR: NEGATIVE
Blood: NEGATIVE
Glucose,UR: NEGATIVE mg/dL (ref 0–75)
Leukocyte Esterase: NEGATIVE
Nitrite: NEGATIVE
Ph: 6 (ref 4.5–8.0)
Protein: NEGATIVE
RBC,UR: <1 /HPF (ref 0–5)
WBC UR: <1 /HPF (ref 0–5)

## 2013-11-28 LAB — TROPONIN I: Troponin-I: <0.02 ng/mL

## 2013-11-28 NOTE — Telephone Encounter (Signed)
Spoke to patient's daughter and was advised that they are at the ER now.

## 2013-11-28 NOTE — Telephone Encounter (Signed)
Agree with outside eval tonight.  Routed to [place] as a FYI.

## 2013-11-28 NOTE — Telephone Encounter (Signed)
Patient Information:  Caller Name: [place]  Phone: [phone]  Patient: Marcile, [place]  Gender: Female  DOB: [birth year]  Age: 72 Years  PCP: Ruthe Mannan [facility])  Office Follow Up:  Does the office need to follow up with this patient?: No  Instructions For The Office: N/A  RN Note:  no appts available today; will use UC or ED tonight  Symptoms  Reason For Call & Symptoms: seen in the office 11/26/13 for couh, fever and cold sxs; placed on Zithromax once daily; vomiting onset 12/16; vomited 1x last night and 1x today; daughter says she is having no improvement with her sxs and should be reseen; c/o dizziness; says she has lost 5lbs since 12/13  Reviewed Health History In EMR: Yes  Reviewed Medications In EMR: Yes  Reviewed Allergies In EMR: Yes  Reviewed Surgeries / Procedures: Yes  Date of Onset of Symptoms: 11/27/2013  Treatments Tried: Zithromax, Nyquil  Treatments Tried Worked: No  Guideline(s) Used:  Cough  Disposition Per Guideline:   Go to ED Now (or to Office with PCP Approval)  Reason For Disposition Reached:   Patient sounds very sick or weak to the triager  Advice Given:  N/A  Patient Will Follow Care Advice:  YES

## 2014-01-03 ENCOUNTER — Ambulatory Visit: Payer: Self-pay | Admitting: Oncology

## 2014-01-04 ENCOUNTER — Ambulatory Visit: Payer: Self-pay | Admitting: Oncology

## 2014-01-07 LAB — COMPREHENSIVE METABOLIC PANEL
ALBUMIN: 3.5 g/dL (ref 3.4–5.0)
AST: 23 U/L (ref 15–37)
Alkaline Phosphatase: 55 U/L
Anion Gap: 9 (ref 7–16)
BILIRUBIN TOTAL: 0.5 mg/dL (ref 0.2–1.0)
BUN: 12 mg/dL (ref 7–18)
CO2: 28 mmol/L (ref 21–32)
Calcium, Total: 9.1 mg/dL (ref 8.5–10.1)
Chloride: 96 mmol/L — ABNORMAL LOW (ref 98–107)
Creatinine: 0.79 mg/dL (ref 0.60–1.30)
EGFR (Non-African Amer.): >60
Glucose: 111 mg/dL — ABNORMAL HIGH (ref 65–99)
OSMOLALITY: 267 (ref 275–301)
POTASSIUM: 3 mmol/L — AB (ref 3.5–5.1)
SGPT (ALT): 20 U/L (ref 12–78)
Sodium: 133 mmol/L — ABNORMAL LOW (ref 136–145)
Total Protein: 7.4 g/dL (ref 6.4–8.2)

## 2014-01-07 LAB — CBC CANCER CENTER
Basophil #: 0.1 x10 3/mm (ref 0.0–0.1)
Basophil %: 1.1 %
EOS PCT: 1.9 %
Eosinophil #: 0.2 x10 3/mm (ref 0.0–0.7)
HCT: 40.1 % (ref 35.0–47.0)
HGB: 13.3 g/dL (ref 12.0–16.0)
Lymphocyte #: 2.3 x10 3/mm (ref 1.0–3.6)
Lymphocyte %: 24.7 %
MCH: 29 pg (ref 26.0–34.0)
MCHC: 33.2 g/dL (ref 32.0–36.0)
MCV: 87 fL (ref 80–100)
MONO ABS: 0.8 x10 3/mm (ref 0.2–0.9)
Monocyte %: 8.7 %
Neutrophil #: 6 x10 3/mm (ref 1.4–6.5)
Neutrophil %: 63.6 %
Platelet: 359 x10 3/mm (ref 150–440)
RBC: 4.6 10*6/uL (ref 3.80–5.20)
RDW: 13.7 % (ref 11.5–14.5)
WBC: 9.4 x10 3/mm (ref 3.6–11.0)

## 2014-01-13 ENCOUNTER — Ambulatory Visit: Payer: Self-pay | Admitting: Oncology

## 2014-01-21 ENCOUNTER — Encounter: Payer: Self-pay | Admitting: Internal Medicine

## 2014-01-21 ENCOUNTER — Ambulatory Visit (INDEPENDENT_AMBULATORY_CARE_PROVIDER_SITE_OTHER): Payer: Medicare Other | Admitting: Internal Medicine

## 2014-01-21 VITALS — BP 120/74 | HR 92 | Temp 97.5°F | Wt 174.5 lb

## 2014-01-21 DIAGNOSIS — J019 Acute sinusitis, unspecified: Secondary | ICD-10-CM

## 2014-01-21 MED ORDER — AMOXICILLIN-POT CLAVULANATE 875-125 MG PO TABS: 1.0000 | ORAL_TABLET | Freq: Two times a day (BID) | ORAL | Status: DC

## 2014-01-21 MED ORDER — FLUTICASONE PROPIONATE 50 MCG/ACT NA SUSP: 2.0000 | Freq: Every day | NASAL | Status: DC

## 2014-01-21 NOTE — Patient Instructions (Signed)

## 2014-01-21 NOTE — Progress Notes (Signed)
HPI  Pt presents to the clinic today with c/o left sided facial pressure and pain and left ear fullness. She reports this started about 1 week ago. She has an associated runny nose and nasal congestion. She is blowing clear mucous out of her nose.It is bright green at times but mostly in the morning. She has taken OTC tylenol cold and flu without much relief. She reports that she has had sick contacts.  Review of Systems      Past Medical History  Diagnosis Date  . Diabetes mellitus   . Hypertension   . Thyroid disease   . Osteoporosis     Family History  Problem Relation Age of Onset  . Heart failure Father   . Hypertension Father   . Stroke Father   . Lung cancer Brother     hx of smoking  . Diabetes Mother   . Hypertension Mother   . Heart attack Mother   . Stroke Brother   . Heart attack Brother     PTCA MI x 2    History   Social History  . Marital Status: Married    Spouse Name: N/A    Number of Children: 68  . Years of Education: N/A   Occupational History  . Retired Pharmacist, hospital    Social History Main Topics  . Smoking status: Never Smoker   . Smokeless tobacco: Not on file  . Alcohol Use: Not on file  . Drug Use: Not on file  . Sexual Activity: Not on file   Other Topics Concern  . Not on file   Social History Narrative   Married 50th anniversary (8/08)          Allergies  Allergen Reactions  . Azithromycin Diarrhea and Nausea And Vomiting  . Ciprofloxacin     tendonitis  . Hydrocodone-Homatropine     REACTION: DROVE HER UP THE WALL  . Rosiglitazone Maleate     REACTION: SWELLING     Constitutional: Positive headache, fatigueDenies fever or abrupt weight changes.  HEENT:  Positive runny nose, nasal congestion, sore throat. Denies eye redness, eye pain, pressure behind the eyes, facial pain, ear pain, ringing in the ears, wax buildup, or bloody nose. Respiratory:  Denies cough, difficulty breathing or shortness of breath.  Cardiovascular:  Denies chest pain, chest tightness, palpitations or swelling in the hands or feet.   No other specific complaints in a complete review of systems (except as listed in HPI above).  Objective:   BP 120/74  Pulse 92  Temp(Src) 97.5 F (36.4 C) (Oral)  Wt 174 lb 8 oz (79.153 kg)  SpO2 99% Wt Readings from Last 3 Encounters:  01/21/14 174 lb 8 oz (79.153 kg)  11/22/13 183 lb 12 oz (83.348 kg)  11/07/13 183 lb 6.4 oz (83.19 kg)     General: Appears her stated age, well developed, well nourished in NAD. HEENT: Head: normal shape and size, left maxillary sinus tenderness noted; Eyes: sclera white, no icterus, conjunctiva pink, PERRLA and EOMs intact; Ears: Tm's gray and intact, normal light reflex, + effusion on the left; Nose: mucosa pink and moist, septum midline; Throat/Mouth: + PND. Teeth present, mucosa erythematous and moist, no exudate noted, no lesions or ulcerations noted.  Neck: Neck supple, trachea midline. No massses, lumps or thyromegaly present.  Cardiovascular: Normal rate and rhythm. S1,S2 noted.  No murmur, rubs or gallops noted. No JVD or BLE edema. No carotid bruits noted. Pulmonary/Chest: Normal effort and positive vesicular breath sounds.  No respiratory distress. No wheezes, rales or ronchi noted.      Assessment & Plan:   Acute Sinusitis:  Get some rest and drink plenty of water Do salt water gargles for the sore throat eRx for Augmentin BID x 10 days eRx for Flonase daily x 3 days then prn  RTC as needed or if symptoms persist.

## 2014-01-21 NOTE — Progress Notes (Signed)
Pre-visit discussion using our clinic review tool. No additional management support is needed unless otherwise documented below in the visit note.  

## 2014-01-28 ENCOUNTER — Other Ambulatory Visit: Payer: Self-pay | Admitting: Family Medicine

## 2014-02-13 ENCOUNTER — Ambulatory Visit: Payer: Medicare Other | Admitting: Internal Medicine

## 2014-02-14 ENCOUNTER — Other Ambulatory Visit: Payer: Self-pay | Admitting: Family Medicine

## 2014-02-14 NOTE — Telephone Encounter (Signed)
Pt requesting medication refill. Last ov was acute visit in 08/2013 and last b12 lab in 02/2013. pls advise

## 2014-02-25 ENCOUNTER — Other Ambulatory Visit: Payer: Self-pay | Admitting: Family Medicine

## 2014-03-13 ENCOUNTER — Ambulatory Visit (INDEPENDENT_AMBULATORY_CARE_PROVIDER_SITE_OTHER): Payer: Medicare Other | Admitting: Family Medicine

## 2014-03-13 ENCOUNTER — Telehealth: Payer: Self-pay | Admitting: Family Medicine

## 2014-03-13 ENCOUNTER — Encounter: Payer: Self-pay | Admitting: Family Medicine

## 2014-03-13 VITALS — BP 136/66 | HR 83 | Temp 98.2°F | Wt 177.0 lb

## 2014-03-13 DIAGNOSIS — E119 Type 2 diabetes mellitus without complications: Secondary | ICD-10-CM

## 2014-03-13 DIAGNOSIS — I10 Essential (primary) hypertension: Secondary | ICD-10-CM

## 2014-03-13 DIAGNOSIS — E039 Hypothyroidism, unspecified: Secondary | ICD-10-CM

## 2014-03-13 DIAGNOSIS — Z23 Encounter for immunization: Secondary | ICD-10-CM

## 2014-03-13 DIAGNOSIS — J189 Pneumonia, unspecified organism: Secondary | ICD-10-CM

## 2014-03-13 MED ORDER — VALSARTAN-HYDROCHLOROTHIAZIDE 160-25 MG PO TABS: ORAL_TABLET | ORAL | Status: DC

## 2014-03-13 NOTE — Telephone Encounter (Signed)
Relevant patient education assigned to patient using Emmi. ° °

## 2014-03-13 NOTE — Progress Notes (Signed)
Pre visit review using our clinic review tool, if applicable. No additional management support is needed unless otherwise documented below in the visit note. 

## 2014-03-13 NOTE — Patient Instructions (Signed)
Please schedule your lab visit for Dr. Cruzita Lederer on your way out.  I am happy to hear you are feeling better, and I am so sorry you were so sick around Christmas.

## 2014-03-13 NOTE — Assessment & Plan Note (Signed)
Well controlled on current rx. eRx sent.

## 2014-03-13 NOTE — Assessment & Plan Note (Signed)
prevnar booster today.

## 2014-03-13 NOTE — Progress Notes (Signed)
73 yo with h/o breast CA, DM here for med refills.    Hypothyroidism- denies any symptoms of hypo or hyperthyroidism. Lab Results  Component Value Date   TSH 0.73 11/19/2013    DM- Seeing Dr. Cruzita Lederer.  Her sugars have improved after addition of Victoza and cutting portions and losing weight, and sugars improved more after increasing Victoza to 1.8.   CBG fasting was 69 this am. Wt Readings from Last 3 Encounters:  03/13/14 177 lb (80.287 kg)  01/21/14 174 lb 8 oz (79.153 kg)  11/22/13 183 lb 12 oz (83.348 kg)     Lab Results  Component Value Date   HGBA1C 6.3 11/19/2013    BP- on Diovan- HCT. Well controlled today.   Denies any HA, burred vision or LE edema.  She did have pneumonia this past winter.  Had her pneumovax after age 26.  Interested in [facility] booster.     Patient Active Problem List   Diagnosis Date Noted  . HIATAL HERNIA 02/10/2010  . ALLERGY, ENVIRONMENTAL 04/29/2009  . CARCINOMA, BREAST, RIGHT 04/26/2007  . HYPOTHYROIDISM 04/26/2007  . DIABETES MELLITUS, TYPE II 04/26/2007  . HYPERTENSION 04/26/2007   Past Medical History  Diagnosis Date  . Diabetes mellitus   . Hypertension   . Thyroid disease   . Osteoporosis    Past Surgical History  Procedure Laterality Date  . Mumps  1965    h/o  . Miscarriage  1970  . Septoplasty  1970  . Appendectomy    . Vaginal hysterectomy  1974    partial, fibroids  . Tonsillectomy  1945  . Foot surgery  1992 & 1994    bilateral  . Back surgery  1983    discectomy  . Ventral hernia repair    . 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 Colonoscopy    . Vaginal delivery      x5  . Other surgical history  04/03/02    dexa, wnl  . Other surgical history  03/12/04    dexa, wnl  . Doppler echocardiography  02/24/06    Triv MR, EF 55-60%  . Other surgical history  08/12/05    dexa-osteopor Hip, osteopen spine  . 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 Other surgical history   10/25/07    dexa- nml hip and spine   History  Substance Use Topics  . Smoking status: Never Smoker   . Smokeless tobacco: Not on file  . Alcohol Use: Not on file   Family History  Problem Relation Age of Onset  . Heart failure Father   . Hypertension Father   . Stroke Father   . Lung cancer Brother     hx of smoking  . Diabetes Mother   . Hypertension Mother   . Heart attack Mother   . Stroke Brother   . Heart attack Brother     PTCA MI x 2   Allergies  Allergen Reactions  . Azithromycin Diarrhea and Nausea And Vomiting  . Ciprofloxacin     tendonitis  . Hydrocodone-Homatropine     REACTION: DROVE HER UP THE WALL  . Rosiglitazone Maleate     REACTION: SWELLING   Current Outpatient Prescriptions on File Prior to Visit  Medication Sig Dispense Refill  . acetaminophen (TYLENOL ARTHRITIS PAIN) 650 MG CR tablet Take 1,300 mg by mouth every 8 (eight) hours as needed for pain.      Marland Kitchen  aspirin 81 MG tablet Take 81 mg by mouth daily.        . Blood Glucose Monitoring Suppl (ACCU-CHEK AVIVA PLUS) W/DEVICE KIT Use to check blood sugar twice daily       . clobetasol ointment (TEMOVATE) 0.05 % Apply nightly to irritated area x 2 weeks.  30 g  0  . cyanocobalamin (,VITAMIN B-12,) 1000 MCG/ML injection INJECT 1 ML INTO THE MUSCLE AS 1 DOSE  1 mL  1  . diphenhydramine-acetaminophen (TYLENOL PM) 25-500 MG TABS Take 1/2 tablet by mouth at bedtime as needed       . ezetimibe (ZETIA) 10 MG tablet Take 1 tablet (10 mg total) by mouth daily.  30 tablet  5  . fluconazole (DIFLUCAN) 150 MG tablet Take 1 tablet (150 mg total) by mouth once.  1 tablet  0  . fluticasone (FLONASE) 50 MCG/ACT nasal spray Place 2 sprays into both nostrils daily.  16 g  6  . glucose blood (ACCU-CHEK AVIVA PLUS) test strip Use as instructed to test two times a day or as directed. Dx code 250.00  100 each  12  . guaiFENesin (MUCINEX) 600 MG 12 hr tablet as needed.        . insulin glargine (LANTUS) 100 UNIT/ML injection  Inject 0.2 mLs (20 Units total) into the skin at bedtime.  10 mL  2  . Insulin Pen Needle 32G X 4 MM MISC Use to inject insulin daily.  Dx: 250.00  100 each  5  . Lancets (ACCU-CHEK MULTICLIX) lancets Use as instructed       . LASTACAFT 0.25 % SOLN       . Liraglutide (VICTOZA) 18 MG/3ML SOPN Inject 1.8 mg into the skin daily.  3 pen  11  . meloxicam (MOBIC) 7.5 MG tablet       . metFORMIN (GLUCOPHAGE) 1000 MG tablet Take 1,000 mg by mouth daily with breakfast.      . Multiple Vitamins-Minerals (CENTRUM SILVER PO) Take 1 tablet by mouth daily.        . naproxen sodium (ANAPROX) 220 MG tablet Take 220 mg by mouth 2 (two) times daily with a meal.      . omeprazole (PRILOSEC) 20 MG capsule Take 20 mg by mouth daily.      . vitamin C (ASCORBIC ACID) 500 MG tablet Take 500 mg by mouth 2 (two) times daily.         No current facility-administered medications on file prior to visit.     The PMH, PSH, Social History, Family History, Medications, and allergies have been reviewed in [facility], and have been updated if relevant.   Review of Systems       See HPI   Physical Exam BP 136/66  Pulse 83  Temp(Src) 98.2 F (36.8 C) (Oral)  Wt 177 lb (80.287 kg)  SpO2 98% Wt Readings from Last 3 Encounters:  03/13/14 177 lb (80.287 kg)  01/21/14 174 lb 8 oz (79.153 kg)  11/22/13 183 lb 12 oz (83.348 kg)     General:  Well-developed,well-nourished,in no acute distress; alert,appropriate and cooperative throughout examination, mildly overweight. Head:  Normocephalic and atraumatic without obvious abnormalities. Wears wig due to past chemo. Eyes:  Conjunctiva clear bilaterally.  Ears:  External ear exam shows no significant lesions or deformities.  Otoscopic examination reveals clear canals, tympanic membranes are intact bilaterally without bulging, retraction, inflammation or discharge. Hearing is grossly normal bilaterally. Nose:  External nasal examination shows no deformity or inflammation.  Nasal  mucosa are pink and moist without lesions or exudates. Mouth:  Oral mucosa and oropharynx without lesions or exudates.  Teeth in good repair. Neck:  No deformities, masses, or tenderness noted. Lungs:  Normal respiratory effort, chest expands symmetrically. Lungs are clear to auscultation, no crackles or wheezes. Heart:  Normal rate and regular rhythm. S1 and S2 normal without gallop, murmur, click, rub or other extra sounds. Abdomen:  Bowel sounds positive,abdomen soft and non-tender without masses, organomegaly or hernias noted. Neurologic:  No cranial nerve deficits noted. Station and gait are normal. Plantar reflexes are down-going bilaterally. DTRs are symmetrical throughout. Sensory, motor and coordinative functions appear intact.  Assessment and Plan:

## 2014-03-13 NOTE — Addendum Note (Signed)
Addended by: Modena Nunnery on: 03/13/2014 11:20 AM   Modules accepted: Orders

## 2014-03-13 NOTE — Assessment & Plan Note (Signed)
No changes. Followed by Dr. Cruzita Lederer.  [name] asks that I place future orders to have her labs checked before she sees Dr. Cruzita Lederer later this month. Orders Placed This Encounter  Procedures  . Hemoglobin A1c  . TSH  . T4, Free

## 2014-03-18 ENCOUNTER — Other Ambulatory Visit: Payer: Self-pay | Admitting: Family Medicine

## 2014-03-27 ENCOUNTER — Ambulatory Visit (INDEPENDENT_AMBULATORY_CARE_PROVIDER_SITE_OTHER): Payer: Medicare Other | Admitting: Internal Medicine

## 2014-03-27 ENCOUNTER — Encounter: Payer: Self-pay | Admitting: Internal Medicine

## 2014-03-27 ENCOUNTER — Telehealth: Payer: Self-pay

## 2014-03-27 VITALS — BP 120/60 | HR 97 | Temp 97.7°F | Resp 12 | Wt 175.0 lb

## 2014-03-27 DIAGNOSIS — E119 Type 2 diabetes mellitus without complications: Secondary | ICD-10-CM

## 2014-03-27 LAB — HEMOGLOBIN A1C: Hgb A1c MFr Bld: 6.3 % (ref 4.6–6.5)

## 2014-03-27 NOTE — Telephone Encounter (Signed)
Relevant patient education assigned to patient using Emmi. ° °

## 2014-03-27 NOTE — Patient Instructions (Signed)
-   Continue Victoza injection once a day 1.8 mg daily - Continue metformin 1000 mg daily - Continue Lantus at the current doses  Please check sugars once a day, but rotate check times.  Please stop at the lab.  Please return in 3 months with your sugar log.

## 2014-03-27 NOTE — Progress Notes (Signed)
Subjective:     Patient ID: [name], female   DOB: [birth year], 73 y.o.   MRN: [id number]  HPI [name] is a 73 y.o. woman returning DM 2, dx 1980, insulin-dependent, uncontrolled, without known complications. Last visit was 5 mo ago.   She had PNA before Christmas 2014. She had Prevnar in 2013.   Her last HbA1c was:  Lab Results  Component Value Date   HGBA1C 6.3 11/19/2013   HGBA1C 6.7* 08/14/2013   HGBA1C 8.0* 02/28/2013   She is on the following regimen: - Lantus now 24 units qhs - Victoza 1.8 mg daily - Metformin 1000 mg at lunchtime We stopped Januvia 100 mg daily and we also stopped Glucovance (glyburide-metformin) 09-999 mg bid at a previous visit.   She checks her sugars one daily and she brings her log with her. We reviewed her log together: - am: 70-107 >> 110-160 >> 71-140, mostly in the 90-110 range >> 91-136 >> 60-110 >> 69-110 - Before lunch: 120-200 >> 90-140 >> 92-225 (much better in last few days) >> 126-135 ?>> n/c - Before dinner: 140-215 >> 100-170 >> 74-151 >> 157 >> n/c - Before bedtime: 150-200 >> can go to 200s occasionally, but mostly 120-170s >> 95-158 >> 116 >> n/c No lows <62. She has hypoglycemia awareness at 60.  She does not have diabetic retinopathy. She has yearly eye exams  ([facility])- Dr Gloriann Loan.  Denies numbness and tingling in her legs.  She does not have chronic kidney disease. Last BUN/Cr: Lab Results  Component Value Date   BUN 17 08/28/2013   CREATININE 0.8 08/28/2013  She is on Valsartan.  Last lipids: Lab Results  Component Value Date   CHOL 193 08/14/2013   HDL 44.60 08/14/2013   LDLCALC 129* 08/14/2013   TRIG 95.0 08/14/2013   CHOLHDL 4 08/14/2013  She is on Zetia.  She also has a history of breast cancer - dx 2000; had chemotx then for 6 months, hypothyroidism, hypertension, osteoporosis, history of back surgeries, right hip pain, hiatal hernia with dysphagia. She is on B12 injections. Last TSH: Lab Results  Component Value Date   TSH  0.73 11/19/2013   TSH 4.27 02/28/2013   TSH 2.63 06/27/2012   TSH 3.25 05/14/2011   TSH 4.24 07/31/2009   Review of Systems Constitutional: no  weight loss, + fatigue. She urinates once a night, during the day she urinates more often.  Eyes: no blurry vision, no xerophthalmia ENT: no sore throat, no nodules palpated in throat, no dysphagia/odynophagia, no hoarseness Cardiovascular: no CP/SOB/palpitations/+ leg swelling Respiratory: no cough/SOB Gastrointestinal: no N/V/D/C Musculoskeletal: + muscle/+  joint aches - after working in the garden yesterday Skin: no rashes  I reviewed pt's medications, allergies, PMH, social hx, family hx and no changes required.  Objective:   Physical Exam BP 120/60  Pulse 97  Temp(Src) 97.7 F (36.5 C) (Oral)  Resp 12  Wt 175 lb (79.379 kg)  SpO2 98% Wt Readings from Last 3 Encounters:  03/27/14 175 lb (79.379 kg)  03/13/14 177 lb (80.287 kg)  01/21/14 174 lb 8 oz (79.153 kg)  Constitutional: overweight, in NAD Eyes: PERRLA, EOMI, no exophthalmos ENT: moist mucous membranes, no thyromegaly, no cervical lymphadenopathy Cardiovascular: RRR, No MRG Respiratory: CTA B Gastrointestinal: abdomen soft, NT, ND, BS+ Musculoskeletal: no deformities, strength intact in all 4 Skin: moist, warm, no rashes. Wears a wig.    Assessment:     1. DM2, uncontrolled, insulin-dependent, without complications    Plan:  1. DM2 - patient's sugars improved dramatically after addition of Victoza and cutting portions and losing weight, and sugars improved more after increasing Victoza to 1.8, so that last HbA1c was <7%! - Continue Victoza injection once a day 1.8 mg daily - Continue metformin 1000 mg daily - Continue Lantus at the current doses - Will check a HbA1C today - Return to clinic in 3 months with her log   2. Hypothyroidism - unclear why she has this dx - latest several TSH levels normal, not on meds - will take off her pb list - last TSH normal 4 mo  ago. No new sxs >> will not repeat now.  Office Visit on 03/27/2014  Component Date Value Ref Range Status  . Hemoglobin A1C 03/27/2014 6.3  4.6 - 6.5 % Final   Glycemic Control Guidelines for People with Diabetes:Non Diabetic:  <6%Goal of Therapy: <7%Additional Action Suggested:  >8%    HbA1c stays great!

## 2014-04-30 ENCOUNTER — Other Ambulatory Visit: Payer: Self-pay

## 2014-04-30 NOTE — Telephone Encounter (Signed)
Pt left v/m requesting refill Lantus insulin to [name]; pt thinks [name] has requested refill of Lantus; but refills may be sent to Dr Cruzita Lederer; pt no longer seeing Dr Cruzita Lederer since not coming to [facility]. Pt request cb when refill done.

## 2014-05-01 MED ORDER — INSULIN GLARGINE 100 UNIT/ML ~~LOC~~ SOLN: 20.0000 [IU] | Freq: Every day | SUBCUTANEOUS | Status: DC

## 2014-06-08 ENCOUNTER — Other Ambulatory Visit: Payer: Self-pay | Admitting: Family Medicine

## 2014-07-17 ENCOUNTER — Other Ambulatory Visit: Payer: Self-pay | Admitting: *Deleted

## 2014-07-17 MED ORDER — OMEPRAZOLE 20 MG PO CPDR: 20.0000 mg | DELAYED_RELEASE_CAPSULE | Freq: Every day | ORAL | Status: DC

## 2014-08-07 ENCOUNTER — Encounter: Payer: Self-pay | Admitting: Internal Medicine

## 2014-08-07 ENCOUNTER — Encounter (INDEPENDENT_AMBULATORY_CARE_PROVIDER_SITE_OTHER): Payer: Self-pay

## 2014-08-07 ENCOUNTER — Ambulatory Visit (INDEPENDENT_AMBULATORY_CARE_PROVIDER_SITE_OTHER): Payer: Medicare Other | Admitting: Internal Medicine

## 2014-08-07 VITALS — BP 132/70 | HR 74 | Temp 97.6°F | Wt 180.0 lb

## 2014-08-07 DIAGNOSIS — L03019 Cellulitis of unspecified finger: Secondary | ICD-10-CM

## 2014-08-07 DIAGNOSIS — T3695XA Adverse effect of unspecified systemic antibiotic, initial encounter: Secondary | ICD-10-CM

## 2014-08-07 DIAGNOSIS — L03012 Cellulitis of left finger: Secondary | ICD-10-CM

## 2014-08-07 DIAGNOSIS — L02519 Cutaneous abscess of unspecified hand: Secondary | ICD-10-CM

## 2014-08-07 DIAGNOSIS — B379 Candidiasis, unspecified: Secondary | ICD-10-CM

## 2014-08-07 MED ORDER — FLUCONAZOLE 150 MG PO TABS: 150.0000 mg | ORAL_TABLET | Freq: Once | ORAL | Status: DC

## 2014-08-07 MED ORDER — DOXYCYCLINE HYCLATE 100 MG PO TABS: 100.0000 mg | ORAL_TABLET | Freq: Two times a day (BID) | ORAL | Status: DC

## 2014-08-07 NOTE — Progress Notes (Signed)
Pre visit review using our clinic review tool, if applicable. No additional management support is needed unless otherwise documented below in the visit note. 

## 2014-08-07 NOTE — Progress Notes (Signed)
Subjective:    Patient ID: [name], female    DOB: 01-Sep-1941, 73 y.o.   MRN: [id number]  HPI  Pt presents to the clinic today with c/o a wasp sting to her left hand. This occurred 5 days ago.  She reports that it didn't really start swelling until yesterday. The swelling is moving up her arm. She has noticed the area is warm and itchy. She denies fever or chills. She has put cool compresses on the area. She has taken Benadryl as well.  Review of Systems      Past Medical History  Diagnosis Date  . Diabetes mellitus   . Hypertension   . Thyroid disease   . Osteoporosis     Current Outpatient Prescriptions  Medication Sig Dispense Refill  . acetaminophen (TYLENOL ARTHRITIS PAIN) 650 MG CR tablet Take 1,300 mg by mouth every 8 (eight) hours as needed for pain.      Marland Kitchen aspirin 81 MG tablet Take 81 mg by mouth daily.        . Blood Glucose Monitoring Suppl (ACCU-CHEK AVIVA PLUS) W/DEVICE KIT Use to check blood sugar twice daily       . clobetasol ointment (TEMOVATE) 0.05 % Apply nightly to irritated area x 2 weeks.  30 g  0  . cyanocobalamin (,VITAMIN B-12,) 1000 MCG/ML injection INJECT 1 ML INTO THE MUSCLE AS 1 DOSE  1 mL  1  . diphenhydramine-acetaminophen (TYLENOL PM) 25-500 MG TABS Take 1/2 tablet by mouth at bedtime as needed       . fluconazole (DIFLUCAN) 150 MG tablet Take 1 tablet (150 mg total) by mouth once.  1 tablet  0  . fluticasone (FLONASE) 50 MCG/ACT nasal spray Place 2 sprays into both nostrils daily.  16 g  6  . glucose blood (ACCU-CHEK AVIVA PLUS) test strip Use as instructed to test two times a day or as directed. Dx code 250.00  100 each  12  . guaiFENesin (MUCINEX) 600 MG 12 hr tablet as needed.        . Insulin Glargine (LANTUS SOLOSTAR) 100 UNIT/ML Solostar Pen Inject 24 units under skin at bedtime  9 mL  3  . insulin glargine (LANTUS) 100 UNIT/ML injection Inject 0.2 mLs (20 Units total) into the skin at bedtime.  10 mL  2  . Insulin Pen Needle 32G X 4  MM MISC Use to inject insulin daily.  Dx: 250.00  100 each  5  . Lancets (ACCU-CHEK MULTICLIX) lancets Use as instructed       . LASTACAFT 0.25 % SOLN       . Liraglutide (VICTOZA) 18 MG/3ML SOPN Inject 1.8 mg into the skin daily.  3 pen  11  . meloxicam (MOBIC) 7.5 MG tablet       . metFORMIN (GLUCOPHAGE) 1000 MG tablet Take 1,000 mg by mouth daily with lunch.       . Multiple Vitamins-Minerals (CENTRUM SILVER PO) Take 1 tablet by mouth daily.        . naproxen sodium (ANAPROX) 220 MG tablet Take 220 mg by mouth 2 (two) times daily with a meal.      . omeprazole (PRILOSEC) 20 MG capsule Take 1 capsule (20 mg total) by mouth daily.  30 capsule  5  . valsartan-hydrochlorothiazide (DIOVAN-HCT) 160-25 MG per tablet TAKE ONE (1) TABLET BY MOUTH EVERY DAY  90 tablet  1  . vitamin C (ASCORBIC ACID) 500 MG tablet Take 500 mg by mouth 2 (  two) times daily.        Marland Kitchen ZETIA 10 MG tablet TAKE ONE (1) TABLET EACH DAY  30 tablet  5   No current facility-administered medications for this visit.    Allergies  Allergen Reactions  . Azithromycin Diarrhea and Nausea And Vomiting  . Ciprofloxacin     tendonitis  . Hydrocodone-Homatropine     REACTION: DROVE HER UP THE WALL  . Rosiglitazone Maleate     REACTION: SWELLING    Family History  Problem Relation Age of Onset  . Heart failure Father   . Hypertension Father   . Stroke Father   . Lung cancer Brother     hx of smoking  . Diabetes Mother   . Hypertension Mother   . Heart attack Mother   . Stroke Brother   . Heart attack Brother     PTCA MI x 2    History   Social History  . Marital Status: Married    Spouse Name: N/A    Number of Children: 34  . Years of Education: N/A   Occupational History  . Retired Pharmacist, hospital    Social History Main Topics  . Smoking status: Never Smoker   . Smokeless tobacco: Not on file  . Alcohol Use: Not on file  . Drug Use: Not on file  . Sexual Activity: Not on file   Other Topics Concern  . Not on  file   Social History Narrative   Married 50th anniversary (8/08)           Constitutional: Denies fever, malaise, fatigue, headache or abrupt weight changes.  Skin: Pt reports insect sting. Denies rashes, lesions or ulcercations.    No other specific complaints in a complete review of systems (except as listed in HPI above).  Objective:   Physical Exam   BP 132/70  Pulse 74  Temp(Src) 97.6 F (36.4 C) (Oral)  Wt 180 lb (81.647 kg)  SpO2 98%  Wt Readings from Last 3 Encounters:  08/07/14 180 lb (81.647 kg)  03/27/14 175 lb (79.379 kg)  03/13/14 177 lb (80.287 kg)    General: Appears her stated age, well developed, well nourished in NAD. Skin:  Swelling, redness and warmth of left middle finger. Cellulitis noted. Cardiovascular: Normal rate and rhythm. S1,S2 noted.  No murmur, rubs or gallops noted. No JVD or BLE edema. No carotid bruits noted. Pulmonary/Chest: Normal effort and positive vesicular breath sounds. No respiratory distress. No wheezes, rales or ronchi noted.    BMET    Component Value Date/Time   NA 132* 08/28/2013 1029   NA 134 12/02/2008 1107   K 4.0 08/28/2013 1029   K 4.1 12/02/2008 1107   CL 96 08/28/2013 1029   CL 95* 12/02/2008 1107   CO2 28 08/28/2013 1029   CO2 27 12/02/2008 1107   GLUCOSE 218* 08/28/2013 1029   GLUCOSE 145* 12/02/2008 1107   BUN 17 08/28/2013 1029   BUN 11 12/02/2008 1107   CREATININE 0.8 08/28/2013 1029   CREATININE 0.8 12/02/2008 1107   CALCIUM 8.9 08/28/2013 1029   CALCIUM 9.4 12/02/2008 1107   GFRNONAA 89.08 08/06/2010 0838   GFRAA 107 07/30/2008 1059    Lipid Panel     Component Value Date/Time   CHOL 193 08/14/2013 1034   TRIG 95.0 08/14/2013 1034   HDL 44.60 08/14/2013 1034   CHOLHDL 4 08/14/2013 1034   VLDL 19.0 08/14/2013 1034   LDLCALC 129* 08/14/2013 1034    CBC  Component Value Date/Time   WBC 8.2 02/28/2013 0916   WBC 9.4 12/02/2008 1107   RBC 4.35 02/28/2013 0916   RBC 4.77 12/02/2008 1107   HGB 12.5  02/28/2013 0916   HGB 13.1 12/02/2008 1107   HCT 37.2 02/28/2013 0916   HCT 39.6 12/02/2008 1107   PLT 398.0 02/28/2013 0916   PLT 330 12/02/2008 1107   MCV 85.6 02/28/2013 0916   MCV 83 12/02/2008 1107   MCH 27.5 12/02/2008 1107   MCHC 33.7 02/28/2013 0916   MCHC 33.1 12/02/2008 1107   RDW 13.6 02/28/2013 0916   RDW 13.1 12/02/2008 1107   LYMPHSABS 1.6 02/28/2013 0916   LYMPHSABS 1.8 12/02/2008 1107   MONOABS 0.7 02/28/2013 0916   EOSABS 0.2 02/28/2013 0916   EOSABS 0.2 12/02/2008 1107   BASOSABS 0.0 02/28/2013 0916   BASOSABS 0.1 12/02/2008 1107    Hgb A1C Lab Results  Component Value Date   HGBA1C 6.3 03/27/2014        Assessment & Plan:   Cellulitis of left middle finger:  eRx for Doxycycline 100 mg BID x 10 days eRx for Diflucan for antibiotic induced yeast infection Continue cool compresses and benadryl  RTC as needed or if symptoms persist or worsen

## 2014-08-07 NOTE — Patient Instructions (Signed)

## 2014-08-23 ENCOUNTER — Encounter: Payer: Self-pay | Admitting: *Deleted

## 2014-08-23 ENCOUNTER — Other Ambulatory Visit (INDEPENDENT_AMBULATORY_CARE_PROVIDER_SITE_OTHER): Payer: Medicare Other

## 2014-08-23 DIAGNOSIS — E119 Type 2 diabetes mellitus without complications: Secondary | ICD-10-CM

## 2014-08-23 DIAGNOSIS — E039 Hypothyroidism, unspecified: Secondary | ICD-10-CM

## 2014-08-23 LAB — TSH: TSH: 2.64 u[IU]/mL (ref 0.35–4.50)

## 2014-08-23 LAB — HEMOGLOBIN A1C: Hgb A1c MFr Bld: 6.9 % — ABNORMAL HIGH (ref 4.6–6.5)

## 2014-08-23 LAB — T4, FREE: Free T4: 1.01 ng/dL (ref 0.60–1.60)

## 2014-09-09 ENCOUNTER — Ambulatory Visit (INDEPENDENT_AMBULATORY_CARE_PROVIDER_SITE_OTHER): Payer: Medicare Other | Admitting: Family Medicine

## 2014-09-09 ENCOUNTER — Encounter: Payer: Self-pay | Admitting: Family Medicine

## 2014-09-09 VITALS — BP 124/68 | HR 98 | Temp 98.0°F | Wt 180.5 lb

## 2014-09-09 DIAGNOSIS — M25569 Pain in unspecified knee: Secondary | ICD-10-CM

## 2014-09-09 DIAGNOSIS — Z23 Encounter for immunization: Secondary | ICD-10-CM

## 2014-09-09 DIAGNOSIS — M25562 Pain in left knee: Secondary | ICD-10-CM | POA: Insufficient documentation

## 2014-09-09 NOTE — Assessment & Plan Note (Signed)
Deteriorated. No known injury. 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 Discussed supplements (Dr. Anette Guarneri OA sheet given). She declined imaging or further work up at this time. Call or return to clinic prn if these symptoms worsen or fail to improve as anticipated. The patient indicates understanding of these issues and agrees with the plan.

## 2014-09-09 NOTE — Progress Notes (Signed)
73 yo with h/o breast CA, DM here left knee pain.   Has had "aches and pains in her joints" since she had chemotherapy years ago.  Also thinks she has OA in "all of her joints."  This summer, worked more in the garden but did not work on her bent knee.  Would always bend by the hip. Last few days, increased pain in left medial knee, some swelling. No redness or warmth. No known injury. No numbness or tingling in her legs or feet.       Patient Active Problem List   Diagnosis Date Noted  . Left knee pain 09/09/2014  . Pneumonia, community acquired 03/13/2014  . HIATAL HERNIA 02/10/2010  . ALLERGY, ENVIRONMENTAL 04/29/2009  . CARCINOMA, BREAST, RIGHT 04/26/2007  . DIABETES MELLITUS, TYPE II 04/26/2007  . HYPERTENSION 04/26/2007   Past Medical History  Diagnosis Date  . Diabetes mellitus   . Hypertension   . Thyroid disease   . Osteoporosis    Past Surgical History  Procedure Laterality Date  . Mumps  1965    h/o  . Miscarriage  1970  . Septoplasty  1970  . Appendectomy    . Vaginal hysterectomy  1974    partial, fibroids  . Tonsillectomy  1945  . Foot surgery  1992 & 1994    bilateral  . Back surgery  1983    discectomy  . Ventral hernia repair    . 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 Colonoscopy    . Vaginal delivery      x5  . Other surgical history  04/03/02    dexa, wnl  . Other surgical history  03/12/04    dexa, wnl  . Doppler echocardiography  02/24/06    Triv MR, EF 55-60%  . Other surgical history  08/12/05    dexa-osteopor Hip, osteopen spine  . Cartoid u/s  12/13/73    10-25% LICA stenosis  . Other surgical history  10/25/07    dexa- nml hip and spine   History  Substance Use Topics  . Smoking status: Never Smoker   . Smokeless tobacco: Not on file  . Alcohol Use: Not on file   Family History  Problem Relation Age of Onset  . Heart failure Father   . Hypertension Father   . Stroke Father   . Lung cancer  Brother     hx of smoking  . Diabetes Mother   . Hypertension Mother   . Heart attack Mother   . Stroke Brother   . Heart attack Brother     PTCA MI x 2   Allergies  Allergen Reactions  . Azithromycin Diarrhea and Nausea And Vomiting  . Ciprofloxacin     tendonitis  . Hydrocodone-Homatropine     REACTION: DROVE HER UP THE WALL  . Rosiglitazone Maleate     REACTION: SWELLING   Current Outpatient Prescriptions on File Prior to Visit  Medication Sig Dispense Refill  . acetaminophen (TYLENOL ARTHRITIS PAIN) 650 MG CR tablet Take 1,300 mg by mouth every 8 (eight) hours as needed for pain.      Marland Kitchen aspirin 81 MG tablet Take 81 mg by mouth daily.        . Blood Glucose Monitoring Suppl (ACCU-CHEK AVIVA PLUS) W/DEVICE KIT Use to check blood sugar twice daily       . clobetasol ointment (TEMOVATE) 0.05 % Apply nightly to irritated area x 2 weeks.  [place]  g  0  . cyanocobalamin (,VITAMIN B-12,) 1000 MCG/ML injection INJECT 1 ML INTO THE MUSCLE AS 1 DOSE  1 mL  1  . diphenhydramine-acetaminophen (TYLENOL PM) 25-500 MG TABS Take 1/2 tablet by mouth at bedtime as needed       . fluticasone (FLONASE) 50 MCG/ACT nasal spray Place 2 sprays into both nostrils daily.  16 g  6  . glucose blood (ACCU-CHEK AVIVA PLUS) test strip Use as instructed to test two times a day or as directed. Dx code 250.00  100 each  12  . guaiFENesin (MUCINEX) 600 MG 12 hr tablet as needed.        . Insulin Glargine (LANTUS SOLOSTAR) 100 UNIT/ML Solostar Pen Inject 24 units under skin at bedtime  9 mL  3  . insulin glargine (LANTUS) 100 UNIT/ML injection Inject 0.2 mLs (20 Units total) into the skin at bedtime.  10 mL  2  . Insulin Pen Needle 32G X 4 MM MISC Use to inject insulin daily.  Dx: 250.00  100 each  5  . Lancets (ACCU-CHEK MULTICLIX) lancets Use as instructed       . LASTACAFT 0.25 % SOLN       . Liraglutide (VICTOZA) 18 MG/3ML SOPN Inject 1.8 mg into the skin daily.  3 pen  11  . meloxicam (MOBIC) 7.5 MG tablet        . metFORMIN (GLUCOPHAGE) 1000 MG tablet Take 1,000 mg by mouth daily with lunch.       . Multiple Vitamins-Minerals (CENTRUM SILVER PO) Take 1 tablet by mouth daily.        . naproxen sodium (ANAPROX) 220 MG tablet Take 220 mg by mouth 2 (two) times daily with a meal.      . omeprazole (PRILOSEC) 20 MG capsule Take 1 capsule (20 mg total) by mouth daily.  30 capsule  5  . valsartan-hydrochlorothiazide (DIOVAN-HCT) 160-25 MG per tablet TAKE ONE (1) TABLET BY MOUTH EVERY DAY  90 tablet  1  . vitamin C (ASCORBIC ACID) 500 MG tablet Take 500 mg by mouth 2 (two) times daily.        Marland Kitchen ZETIA 10 MG tablet TAKE ONE (1) TABLET EACH DAY  30 tablet  5   No current facility-administered medications on file prior to visit.     The PMH, PSH, Social History, Family History, Medications, and allergies have been reviewed in [facility], and have been updated if relevant.   Review of Systems       See HPI No LE weakness +left hip pain- intermittent +hand pain- intermittent  Physical Exam BP 124/68  Pulse 98  Temp(Src) 98 F (36.7 C) (Oral)  Wt 180 lb 8 oz (81.874 kg)  SpO2 98% Wt Readings from Last 3 Encounters:  09/09/14 180 lb 8 oz (81.874 kg)  08/07/14 180 lb (81.647 kg)  03/27/14 175 lb (79.379 kg)     General:  Well-developed,well-nourished,in no acute distress; alert,appropriate and cooperative throughout examination, mildly overweight. MSK: Left knee ?small effusion, FROM, +crepitus, neg ant drawer Normal gait Mild TTP over medial joint line Neurologic:  No cranial nerve deficits noted. Station and gait are normal. Plantar reflexes are down-going bilaterally. DTRs are symmetrical throughout. Sensory, motor and coordinative functions appear intact.

## 2014-09-09 NOTE — Progress Notes (Signed)
Pre visit review using our clinic review tool, if applicable. No additional management support is needed unless otherwise documented below in the visit note. 

## 2014-09-11 ENCOUNTER — Other Ambulatory Visit: Payer: Self-pay | Admitting: Family Medicine

## 2014-09-23 ENCOUNTER — Other Ambulatory Visit: Payer: Self-pay | Admitting: Family Medicine

## 2014-09-27 ENCOUNTER — Encounter: Payer: Medicare Other | Admitting: Family Medicine

## 2014-10-01 ENCOUNTER — Ambulatory Visit (INDEPENDENT_AMBULATORY_CARE_PROVIDER_SITE_OTHER): Payer: Medicare Other | Admitting: Family Medicine

## 2014-10-01 ENCOUNTER — Encounter: Payer: Self-pay | Admitting: Family Medicine

## 2014-10-01 VITALS — BP 132/72 | HR 84 | Temp 97.5°F | Ht 65.5 in | Wt 179.5 lb

## 2014-10-01 DIAGNOSIS — I6521 Occlusion and stenosis of right carotid artery: Secondary | ICD-10-CM | POA: Insufficient documentation

## 2014-10-01 DIAGNOSIS — E119 Type 2 diabetes mellitus without complications: Secondary | ICD-10-CM

## 2014-10-01 DIAGNOSIS — Z0000 Encounter for general adult medical examination without abnormal findings: Secondary | ICD-10-CM | POA: Insufficient documentation

## 2014-10-01 DIAGNOSIS — I10 Essential (primary) hypertension: Secondary | ICD-10-CM

## 2014-10-01 DIAGNOSIS — C50919 Malignant neoplasm of unspecified site of unspecified female breast: Secondary | ICD-10-CM

## 2014-10-01 LAB — TSH: TSH: 2.45 u[IU]/mL (ref 0.35–4.50)

## 2014-10-01 LAB — CBC WITH DIFFERENTIAL/PLATELET
BASOS PCT: 0.3 % (ref 0.0–3.0)
Basophils Absolute: 0 10*3/uL (ref 0.0–0.1)
EOS ABS: 0.1 10*3/uL (ref 0.0–0.7)
Eosinophils Relative: 0.9 % (ref 0.0–5.0)
HCT: 40.3 % (ref 36.0–46.0)
HEMOGLOBIN: 13.3 g/dL (ref 12.0–15.0)
Lymphocytes Relative: 21.6 % (ref 12.0–46.0)
Lymphs Abs: 2.3 10*3/uL (ref 0.7–4.0)
MCHC: 32.9 g/dL (ref 30.0–36.0)
MCV: 89.4 fl (ref 78.0–100.0)
Monocytes Absolute: 0.7 10*3/uL (ref 0.1–1.0)
Monocytes Relative: 6.5 % (ref 3.0–12.0)
NEUTROS ABS: 7.4 10*3/uL (ref 1.4–7.7)
Neutrophils Relative %: 70.7 % (ref 43.0–77.0)
Platelets: 419 10*3/uL — ABNORMAL HIGH (ref 150.0–400.0)
RBC: 4.51 Mil/uL (ref 3.87–5.11)
RDW: 13 % (ref 11.5–15.5)
WBC: 10.4 10*3/uL (ref 4.0–10.5)

## 2014-10-01 LAB — LIPID PANEL
CHOLESTEROL: 192 mg/dL (ref 0–200)
HDL: 51.8 mg/dL (ref >39.00)
LDL Cholesterol: 113 mg/dL — ABNORMAL HIGH (ref 0–99)
NonHDL: 140.2
Total CHOL/HDL Ratio: 4
Triglycerides: 137 mg/dL (ref 0.0–149.0)
VLDL: 27.4 mg/dL (ref 0.0–40.0)

## 2014-10-01 LAB — COMPREHENSIVE METABOLIC PANEL
ALT: 14 U/L (ref 0–35)
AST: 22 U/L (ref 0–37)
Albumin: 3.7 g/dL (ref 3.5–5.2)
Alkaline Phosphatase: 46 U/L (ref 39–117)
BUN: 17 mg/dL (ref 6–23)
CALCIUM: 9.7 mg/dL (ref 8.4–10.5)
CO2: 24 meq/L (ref 19–32)
CREATININE: 0.8 mg/dL (ref 0.4–1.2)
Chloride: 95 mEq/L — ABNORMAL LOW (ref 96–112)
GFR: 71.16 mL/min (ref >60.00)
Glucose, Bld: 90 mg/dL (ref 70–99)
Potassium: 3.7 mEq/L (ref 3.5–5.1)
Sodium: 132 mEq/L — ABNORMAL LOW (ref 135–145)
Total Bilirubin: 0.9 mg/dL (ref 0.2–1.2)
Total Protein: 7.9 g/dL (ref 6.0–8.3)

## 2014-10-01 NOTE — Assessment & Plan Note (Signed)
New- she is very concerned about this. Discussed this with pt- she is already taking ASA 81 mg daily. Has been previously intolerant to statins but we could certainly try another low dose statin. Check labs today first. The patient indicates understanding of these issues and agrees with the plan.

## 2014-10-01 NOTE — Progress Notes (Signed)
73 yo female with h/o breast CA, DM here for medicare wellness visit.  I have personally reviewed the Medicare Annual Wellness questionnaire and have noted 1. The patient's medical and social history 2. Their use of alcohol, tobacco or illicit drugs 3. Their current medications and supplements 4. The patient's functional ability including ADL's, fall risks, home safety risks and hearing or visual             impairment. 5. Diet and physical activities 6. Evidence for depression or mood disorders  End of life wishes discussed and updated in Social History.  The roster of all physicians providing medical care to patient - is listed in the Snapshot section of the chart.   Influenza vaccine 09/09/14 Prevnar 13 03/13/14 Pneumovax 12/28/11 Td 08/01/08 Zoster 07/04/12 Colonoscopy 04/22/10- Dr. Vira Agar, 10 year recall. Last eye exam was 03/2014- Dr. Gloriann Loan Mammogram 12/2013   Hypothyroidism- denies any symptoms of hypo or hyperthyroidism. Lab Results  Component Value Date   TSH 2.64 08/23/2014    DM- Seeing Dr. Cruzita Lederer.  Her sugars have improved after addition of Victoza and cutting portions and losing weight, and sugars improved more after increasing Victoza to 1.8.   Checks FSBS- averaging between 87-92.   Last saw her in 03/2014- note reviewed. Wt Readings from Last 3 Encounters:  10/01/14 179 lb 8 oz (81.421 kg)  09/09/14 180 lb 8 oz (81.874 kg)  08/07/14 180 lb (81.647 kg)     Lab Results  Component Value Date   HGBA1C 6.9* 08/23/2014   Lab Results  Component Value Date   CHOL 193 08/14/2013   HDL 44.60 08/14/2013   LDLCALC 129* 08/14/2013   TRIG 95.0 08/14/2013   CHOLHDL 4 08/14/2013    BP- on Diovan- HCT. Well controlled today.   Denies any HA, burred vision or LE edema.  H/o breast cancer- 15 years out.  Seeing Dr. Paulino Door who will be discharging her since she has been doing so well.  Mammogram UTD.  Carotid stenosis-  Had vascular screening done at [facility]- brings me the results.  ABIs normal but did have 50% stenosis on right bulb.  ICA normal bilaterally. Denies dizziness or HA unless she stands up too quickly occasionally when working in the yard.     Patient Active Problem List   Diagnosis Date Noted  . Medicare annual wellness visit, subsequent 10/01/2014  . Left knee pain 09/09/2014  . HIATAL HERNIA 02/10/2010  . ALLERGY, ENVIRONMENTAL 04/29/2009  . Malignant neoplasm of female breast 04/26/2007  . Diabetes 04/26/2007  . Essential hypertension 04/26/2007   Past Medical History  Diagnosis Date  . Diabetes mellitus   . Hypertension   . Thyroid disease   . Osteoporosis    Past Surgical History  Procedure Laterality Date  . Mumps  1965    h/o  . Miscarriage  1970  . Septoplasty  1970  . Appendectomy    . Vaginal hysterectomy  1974    partial, fibroids  . Tonsillectomy  1945  . Foot surgery  1992 & 1994    bilateral  . Back surgery  1983    discectomy  . Ventral hernia repair    . 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 Colonoscopy    . Vaginal delivery      x5  . Other surgical history  04/03/02    dexa, wnl  . Other surgical history  03/12/04    dexa, wnl  .  Doppler echocardiography  02/24/06    Triv MR, EF 55-60%  . Other surgical history  08/12/05    dexa-osteopor Hip, osteopen spine  . Cartoid u/s  07/21/97    33-82% LICA stenosis  . Other surgical history  10/25/07    dexa- nml hip and spine   History  Substance Use Topics  . Smoking status: Never Smoker   . Smokeless tobacco: Not on file  . Alcohol Use: Not on file   Family History  Problem Relation Age of Onset  . Heart failure Father   . Hypertension Father   . Stroke Father   . Lung cancer Brother     hx of smoking  . Diabetes Mother   . Hypertension Mother   . Heart attack Mother   . Stroke Brother   . Heart attack Brother     PTCA MI x 2   Allergies  Allergen Reactions  . Azithromycin Diarrhea and Nausea And Vomiting  .  Ciprofloxacin     tendonitis  . Hydrocodone-Homatropine     REACTION: DROVE HER UP THE WALL  . Rosiglitazone Maleate     REACTION: SWELLING   Current Outpatient Prescriptions on File Prior to Visit  Medication Sig Dispense Refill  . acetaminophen (TYLENOL ARTHRITIS PAIN) 650 MG CR tablet Take 1,300 mg by mouth every 8 (eight) hours as needed for pain.      Marland Kitchen aspirin 81 MG tablet Take 81 mg by mouth daily.        . BD PEN NEEDLE NANO U/F 32G X 4 MM MISC USE TO INJECT INSULIN DAILY.  100 each  1  . Blood Glucose Monitoring Suppl (ACCU-CHEK AVIVA PLUS) W/DEVICE KIT Use to check blood sugar twice daily       . clobetasol ointment (TEMOVATE) 0.05 % Apply nightly to irritated area x 2 weeks.  30 g  0  . cyanocobalamin (,VITAMIN B-12,) 1000 MCG/ML injection INJECT 1 ML INTO THE MUSCLE AS 1 DOSE  1 mL  1  . diphenhydramine-acetaminophen (TYLENOL PM) 25-500 MG TABS Take 1/2 tablet by mouth at bedtime as needed       . fluticasone (FLONASE) 50 MCG/ACT nasal spray Place 2 sprays into both nostrils daily.  16 g  6  . glucose blood (ACCU-CHEK AVIVA PLUS) test strip Use as instructed to test two times a day or as directed. Dx code 250.00  100 each  12  . guaiFENesin (MUCINEX) 600 MG 12 hr tablet as needed.        . Insulin Glargine (LANTUS SOLOSTAR) 100 UNIT/ML Solostar Pen Inject 24 units under skin at bedtime  9 mL  3  . insulin glargine (LANTUS) 100 UNIT/ML injection Inject 0.2 mLs (20 Units total) into the skin at bedtime.  10 mL  2  . Lancets (ACCU-CHEK MULTICLIX) lancets Use as instructed       . LASTACAFT 0.25 % SOLN       . Liraglutide (VICTOZA) 18 MG/3ML SOPN Inject 1.8 mg into the skin daily.  3 pen  11  . meloxicam (MOBIC) 7.5 MG tablet       . metFORMIN (GLUCOPHAGE) 1000 MG tablet Take 1,000 mg by mouth daily with lunch.       . Multiple Vitamins-Minerals (CENTRUM SILVER PO) Take 1 tablet by mouth daily.        . naproxen sodium (ANAPROX) 220 MG tablet Take 220 mg by mouth 2 (two) times  daily with a meal.      .  omeprazole (PRILOSEC) 20 MG capsule Take 1 capsule (20 mg total) by mouth daily.  30 capsule  5  . valsartan-hydrochlorothiazide (DIOVAN-HCT) 160-25 MG per tablet TAKE ONE (1) TABLET EACH DAY  90 tablet  0  . vitamin C (ASCORBIC ACID) 500 MG tablet Take 500 mg by mouth 2 (two) times daily.        Marland Kitchen ZETIA 10 MG tablet TAKE ONE (1) TABLET EACH DAY  30 tablet  1   No current facility-administered medications on file prior to visit.     The PMH, PSH, Social History, Family History, Medications, and allergies have been reviewed in [facility], and have been updated if relevant.   Review of Systems       See HPI Denies anxiety or depression- does worry about her son who lost his job but she feels this is normal worrying Denies changes in her bowel habits or blood in her stool No HA No blurred vision Appetite good Sleeping ok. No LE edema  Physical Exam BP 132/72  Pulse 84  Temp(Src) 97.5 F (36.4 C) (Oral)  Ht 5' 5.5" (1.664 m)  Wt 179 lb 8 oz (81.421 kg)  BMI 29.41 kg/m2  SpO2 99% Wt Readings from Last 3 Encounters:  10/01/14 179 lb 8 oz (81.421 kg)  09/09/14 180 lb 8 oz (81.874 kg)  08/07/14 180 lb (81.647 kg)     General:  Well-developed,well-nourished,in no acute distress; alert,appropriate and cooperative throughout examination, mildly overweight. Head:  Normocephalic and atraumatic without obvious abnormalities. Wears wig due to past chemo. Eyes:  Conjunctiva clear bilaterally.  Ears:  External ear exam shows no significant lesions or deformities.  Otoscopic examination reveals clear canals, tympanic membranes are intact bilaterally without bulging, retraction, inflammation or discharge. Hearing is grossly normal bilaterally. Nose:  External nasal examination shows no deformity or inflammation. Nasal mucosa are pink and moist without lesions or exudates. Mouth:  Oral mucosa and oropharynx without lesions or exudates.  Teeth in good repair. Neck:  No  deformities, masses, or tenderness noted. Lungs:  Normal respiratory effort, chest expands symmetrically. Lungs are clear to auscultation, no crackles or wheezes. Heart:  Normal rate and regular rhythm. S1 and S2 normal without gallop, murmur, click, rub or other extra sounds. Abdomen:  Bowel sounds positive,abdomen soft and non-tender without masses, organomegaly or hernias noted. Neurologic:  No cranial nerve deficits noted. Station and gait are normal. Plantar reflexes are down-going bilaterally. DTRs are symmetrical throughout. Sensory, motor and coordinative functions appear intact.

## 2014-10-01 NOTE — Assessment & Plan Note (Signed)
The patients weight, height, BMI and visual acuity have been recorded in the chart I have made referrals, counseling and provided education to the patient based review of the above and I have provided the pt with a written personalized care plan for preventive services.  

## 2014-10-01 NOTE — Patient Instructions (Signed)
Great to see you. I will call you with your lab results and you can see them online.

## 2014-10-01 NOTE — Progress Notes (Signed)
Pre visit review using our clinic review tool, if applicable. No additional management support is needed unless otherwise documented below in the visit note. 

## 2014-10-01 NOTE — Assessment & Plan Note (Signed)
At goal for a diabetic. No changes made today. 

## 2014-10-01 NOTE — Assessment & Plan Note (Signed)
Well controlled. Followed by Dr. Cruzita Lederer.

## 2014-10-03 ENCOUNTER — Encounter: Payer: Self-pay | Admitting: *Deleted

## 2014-10-08 ENCOUNTER — Other Ambulatory Visit: Payer: Self-pay | Admitting: Family Medicine

## 2014-10-10 ENCOUNTER — Telehealth: Payer: Self-pay | Admitting: Family Medicine

## 2014-10-10 NOTE — Telephone Encounter (Signed)
Patient Information:  Caller Name: Schyler  Phone: [phone]  Patient: [name], [name]  Gender: Female  DOB: 12-Dec-1941  Age: 73 Years  PCP: [facility])  Office Follow Up:  Does the office need to follow up with this patient?: Yes  Instructions For The Office: OFFICE NOTE:REVIEW CALL AND CONTACT PT WITH ADVICE FROM PCP  RN Note:  Pt reports that 10/28 she developed urinary urgency and frequency.  Pain is described as Moderate.  Right side lower back "aching" is also present.  No blood in urine.  Afebrile.  No changes in mental status.  She is declined offered appt and would like for Dr Deborra Medina to advise if she can bring in a urine sample or call in medication.   OFFICE NOTE: AFTER CHART REVIEW PLEASE CONTACT PT AND ADVISE  Symptoms  Reason For Call & Symptoms: urinary urgency and frequency  Reviewed Health History In EMR: Yes  Reviewed Medications In EMR: Yes  Reviewed Allergies In EMR: Yes  Reviewed Surgeries / Procedures: Yes  Date of Onset of Symptoms: 10/09/2014  Treatments Tried: AZO  Treatments Tried Worked: Yes  Guideline(s) Used:  Urination Pain - Female  Disposition Per Guideline:   Go to Office Now  Reason For Disposition Reached:   Side (flank) or lower back pain present  Advice Given:  N/A  RN Overrode Recommendation:  Patient Requests Prescription  OFFICE:  PT IS REQUESTING DR Deborra Medina REVIEW SYMPTOMS AND CALL IN MEDICATION

## 2014-10-10 NOTE — Telephone Encounter (Signed)
Dr. Deborra Medina out of office. Pt would need to be seen

## 2014-10-10 NOTE — Telephone Encounter (Signed)
Pt made an appt with Dr Diona Browner for 10/11/2014

## 2014-10-11 ENCOUNTER — Encounter: Payer: Self-pay | Admitting: Family Medicine

## 2014-10-11 ENCOUNTER — Ambulatory Visit (INDEPENDENT_AMBULATORY_CARE_PROVIDER_SITE_OTHER): Payer: Medicare Other | Admitting: Family Medicine

## 2014-10-11 VITALS — BP 120/60 | HR 97 | Temp 98.1°F | Ht 65.5 in | Wt 179.5 lb

## 2014-10-11 DIAGNOSIS — N390 Urinary tract infection, site not specified: Secondary | ICD-10-CM | POA: Insufficient documentation

## 2014-10-11 DIAGNOSIS — N3001 Acute cystitis with hematuria: Secondary | ICD-10-CM

## 2014-10-11 DIAGNOSIS — R3915 Urgency of urination: Secondary | ICD-10-CM

## 2014-10-11 LAB — POCT URINALYSIS DIPSTICK
Bilirubin, UA: NEGATIVE
Glucose, UA: NEGATIVE
Ketones, UA: NEGATIVE
Nitrite, UA: POSITIVE
PH UA: 6
Protein, UA: NEGATIVE
SPEC GRAV UA: 1.015
UROBILINOGEN UA: 1

## 2014-10-11 MED ORDER — SULFAMETHOXAZOLE-TMP DS 800-160 MG PO TABS: 1.0000 | ORAL_TABLET | Freq: Two times a day (BID) | ORAL | Status: DC

## 2014-10-11 NOTE — Progress Notes (Signed)
Pre visit review using our clinic review tool, if applicable. No additional management support is needed unless otherwise documented below in the visit note. 

## 2014-10-11 NOTE — Progress Notes (Signed)
   Subjective:    Patient ID: [name], female    DOB: November 12, 1941, 73 y.o.   MRN: [id number]  HPI   73 year old female pt of Dr. Hulen Shouts with history of DM presents with  2 days of urinary frequency, bladder spasm, dysuria. She started Azo, which helped temporarily with symptoms.  No fever, no flank pain. Slightly achy on right low and mid back Body ache all over, chills.   Review of Systems  Constitutional: Negative for fever and fatigue.  HENT: Negative for ear pain.   Eyes: Negative for pain.  Respiratory: Negative for chest tightness and shortness of breath.   Cardiovascular: Negative for chest pain, palpitations and leg swelling.  Gastrointestinal: Negative for abdominal pain.  Genitourinary: Positive for dysuria. Negative for hematuria, vaginal bleeding and vaginal discharge.       Objective:   Physical Exam  Constitutional: Vital signs are normal. She appears well-developed and well-nourished. She is cooperative.  Non-toxic appearance. She does not appear ill. No distress.  HENT:  Head: Normocephalic.  Right Ear: Hearing, tympanic membrane, external ear and ear canal normal. Tympanic membrane is not erythematous, not retracted and not bulging.  Left Ear: Hearing, tympanic membrane, external ear and ear canal normal. Tympanic membrane is not erythematous, not retracted and not bulging.  Nose: No mucosal edema or rhinorrhea. Right sinus exhibits no maxillary sinus tenderness and no frontal sinus tenderness. Left sinus exhibits no maxillary sinus tenderness and no frontal sinus tenderness.  Mouth/Throat: Uvula is midline, oropharynx is clear and moist and mucous membranes are normal.  Eyes: Conjunctivae, EOM and lids are normal. Pupils are equal, round, and reactive to light. Lids are everted and swept, no foreign bodies found.  Neck: Trachea normal and normal range of motion. Neck supple. Carotid bruit is not present. No mass and no thyromegaly present.  Cardiovascular:  Normal rate, regular rhythm, S1 normal, S2 normal, normal heart sounds, intact distal pulses and normal pulses.  Exam reveals no gallop and no friction rub.   No murmur heard. Pulmonary/Chest: Effort normal and breath sounds normal. Not tachypneic. No respiratory distress. She has no decreased breath sounds. She has no wheezes. She has no rhonchi. She has no rales.  Abdominal: Soft. Normal appearance and bowel sounds are normal. There is no hepatosplenomegaly. There is tenderness in the suprapubic area. There is no CVA tenderness. No hernia.  Neurological: She is alert.  Skin: Skin is warm, dry and intact. No rash noted.  Psychiatric: Her speech is normal and behavior is normal. Judgment and thought content normal. Her mood appears not anxious. Cognition and memory are normal. She does not exhibit a depressed mood.          Assessment & Plan:

## 2014-10-11 NOTE — Assessment & Plan Note (Signed)
Treat with bactrim x 7 days given body ache and ? slight CVA ache.

## 2014-10-11 NOTE — Patient Instructions (Signed)
Push fluids, rest. Complete antibiotics Call if not improving some in 48-72 hours. Call sooner if fever on antibiotics.

## 2014-10-14 LAB — URINE CULTURE: Colony Count: >=100000

## 2014-11-04 ENCOUNTER — Other Ambulatory Visit: Payer: Self-pay | Admitting: Family Medicine

## 2014-11-16 ENCOUNTER — Other Ambulatory Visit: Payer: Self-pay | Admitting: Family Medicine

## 2014-12-12 ENCOUNTER — Telehealth: Payer: Self-pay | Admitting: *Deleted

## 2014-12-12 ENCOUNTER — Telehealth: Payer: Self-pay | Admitting: Family Medicine

## 2014-12-12 DIAGNOSIS — E119 Type 2 diabetes mellitus without complications: Secondary | ICD-10-CM

## 2014-12-12 MED ORDER — LIRAGLUTIDE 18 MG/3ML ~~LOC~~ SOPN: 1.8000 mg | PEN_INJECTOR | Freq: Every day | SUBCUTANEOUS | Status: DC

## 2014-12-12 NOTE — Telephone Encounter (Signed)
Rx sent to pharmacy   

## 2014-12-12 NOTE — Telephone Encounter (Signed)
Patient called because she is upset that she called her pharmacy for a refill on her Victoza yesterday and it still hasn't been filled yet today.  I explained that sometimes it takes 24-48 hours to be filled especially when it is faxed over instead of being sent electronically.  Patient wants this filled as soon as possible.  [facility] If you do not have the paper request from the pharmacy, please call rx in to pharmacy.  Thanks!  Please call patient once this has been done.

## 2014-12-12 NOTE — Telephone Encounter (Signed)
Opened in error

## 2014-12-23 ENCOUNTER — Other Ambulatory Visit: Payer: Self-pay | Admitting: Family Medicine

## 2014-12-30 ENCOUNTER — Other Ambulatory Visit: Payer: Self-pay | Admitting: Family Medicine

## 2015-01-06 ENCOUNTER — Other Ambulatory Visit: Payer: Self-pay | Admitting: Family Medicine

## 2015-01-07 ENCOUNTER — Encounter: Payer: Self-pay | Admitting: Family Medicine

## 2015-01-07 ENCOUNTER — Ambulatory Visit (INDEPENDENT_AMBULATORY_CARE_PROVIDER_SITE_OTHER): Payer: Medicare Other | Admitting: Family Medicine

## 2015-01-07 VITALS — BP 128/68 | HR 98 | Temp 98.0°F | Wt 179.5 lb

## 2015-01-07 DIAGNOSIS — J0110 Acute frontal sinusitis, unspecified: Secondary | ICD-10-CM

## 2015-01-07 MED ORDER — AMOXICILLIN-POT CLAVULANATE 875-125 MG PO TABS: 1.0000 | ORAL_TABLET | Freq: Two times a day (BID) | ORAL | Status: AC

## 2015-01-07 NOTE — Patient Instructions (Signed)
It was good to see you. Hang in there.  Take Augmentin as directed- 1 tablet twice daily x 10 days.

## 2015-01-07 NOTE — Progress Notes (Signed)
SUBJECTIVE:  [name] is a 74 y.o. female who complains of coryza, congestion, sneezing, sore throat, , myalgias, headache and sinus pressure, left > right for 8 days.  When stakes alkaselter sinus, thick drainage came from her left side of her nostril.   She denies a history of anorexia, chest pain, chills and dizziness and denies a history of asthma. Patient denies smoke cigarettes.   Current Outpatient Prescriptions on File Prior to Visit  Medication Sig Dispense Refill  . acetaminophen (TYLENOL ARTHRITIS PAIN) 650 MG CR tablet Take 1,300 mg by mouth every 8 (eight) hours as needed for pain.    Marland Kitchen aspirin 81 MG tablet Take 81 mg by mouth daily.      . BD PEN NEEDLE NANO U/F 32G X 4 MM MISC USE TO INJECT INSULIN DAILY 100 each 1  . Blood Glucose Monitoring Suppl (ACCU-CHEK AVIVA PLUS) W/DEVICE KIT Use to check blood sugar twice daily     . clobetasol ointment (TEMOVATE) 0.05 % Apply nightly to irritated area x 2 weeks. 30 g 0  . cyanocobalamin (,VITAMIN B-12,) 1000 MCG/ML injection INJECT 1 ML ONCE A MONTH 1 mL 1  . diphenhydramine-acetaminophen (TYLENOL PM) 25-500 MG TABS Take 1/2 tablet by mouth at bedtime as needed     . fluticasone (FLONASE) 50 MCG/ACT nasal spray Place 2 sprays into both nostrils daily. 16 g 6  . glucose blood (ACCU-CHEK AVIVA PLUS) test strip Use as instructed to test two times a day or as directed. Dx code 250.00 100 each 12  . guaiFENesin (MUCINEX) 600 MG 12 hr tablet as needed.      . Lancets (ACCU-CHEK MULTICLIX) lancets Use as instructed     . LANTUS SOLOSTAR 100 UNIT/ML Solostar Pen INJECT 26 UNITS (0.2 MLS) INTO THE SKIN AT BEDTIME 6 mL 5  . LASTACAFT 0.25 % SOLN     . Liraglutide (VICTOZA) 18 MG/3ML SOPN Inject 1.8 mg into the skin daily. 3 pen 2  . meloxicam (MOBIC) 7.5 MG tablet     . metFORMIN (GLUCOPHAGE) 1000 MG tablet Take 1,000 mg by mouth daily with lunch.     . Misc Natural Products (TART CHERRY ADVANCED PO) Take by mouth.    . Multiple  Vitamins-Minerals (CENTRUM SILVER PO) Take 1 tablet by mouth daily.      . naproxen sodium (ANAPROX) 220 MG tablet Take 220 mg by mouth 2 (two) times daily with a meal.    . omeprazole (PRILOSEC) 20 MG capsule Take 1 capsule (20 mg total) by mouth daily. 30 capsule 5  . sulfamethoxazole-trimethoprim (BACTRIM DS) 800-160 MG per tablet Take 1 tablet by mouth 2 (two) times daily. 14 tablet 0  . valsartan-hydrochlorothiazide (DIOVAN-HCT) 160-25 MG per tablet TAKE ONE (1) TABLET EACH DAY 90 tablet 0  . vitamin C (ASCORBIC ACID) 500 MG tablet Take 500 mg by mouth 2 (two) times daily.      Marland Kitchen ZETIA 10 MG tablet TAKE ONE (1) TABLET EACH DAY 30 tablet 3   No current facility-administered medications on file prior to visit.    Allergies  Allergen Reactions  . Atorvastatin     Leg swelling  . Azithromycin Diarrhea and Nausea And Vomiting  . Ciprofloxacin     tendonitis  . Hydrocodone-Homatropine     REACTION: DROVE HER UP THE WALL  . Rosiglitazone Maleate     REACTION: SWELLING    Past Medical History  Diagnosis Date  . Diabetes mellitus   . Hypertension   .  Thyroid disease   . Osteoporosis     Past Surgical History  Procedure Laterality Date  . Mumps  1965    h/o  . Miscarriage  1970  . Septoplasty  1970  . Appendectomy    . Vaginal hysterectomy  1974    partial, fibroids  . Tonsillectomy  1945  . Foot surgery  1992 & 1994    bilateral  . Back surgery  1983    discectomy  . Ventral hernia repair    . 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 Colonoscopy    . Vaginal delivery      x5  . Other surgical history  04/03/02    dexa, wnl  . Other surgical history  03/12/04    dexa, wnl  . Doppler echocardiography  02/24/06    Triv MR, EF 55-60%  . Other surgical history  08/12/05    dexa-osteopor Hip, osteopen spine  . 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 Other surgical history  10/25/07    dexa- nml hip and spine    Family History   Problem Relation Age of Onset  . Heart failure Father   . Hypertension Father   . Stroke Father   . Lung cancer Brother     hx of smoking  . Diabetes Mother   . Hypertension Mother   . Heart attack Mother   . Stroke Brother   . Heart attack Brother     PTCA MI x 2    History   Social History  . Marital Status: Married    Spouse Name: N/A    Number of Children: 43  . Years of Education: N/A   Occupational History  . Retired Pharmacist, hospital    Social History Main Topics  . Smoking status: Never Smoker   . Smokeless tobacco: Never Used  . Alcohol Use: No  . Drug Use: No  . Sexual Activity: Not on file   Other Topics Concern  . Not on file   Social History Narrative   Married 50th anniversary (8/08)   Has a Living Will- all of her children have a copy of it.   Desires CPR but would not want prolonged life support.               The PMH, PSH, Social History, Family History, Medications, and allergies have been reviewed in [facility], and have been updated if relevant.  OBJECTIVE: BP 128/68 mmHg  Pulse 98  Temp(Src) 98 F (36.7 C) (Oral)  Wt 179 lb 8 oz (81.421 kg)  She appears well, vital signs are as noted. Ears normal.  Throat and pharynx normal.  Neck supple. No adenopathy in the neck. Nose is congested. Sinuses tender, left > right . The chest is clear, without wheezes or rales.  ASSESSMENT:  sinusitis  PLAN: Given duration and progression of symptoms, will treat for bacterial sinusitis.  Symptomatic therapy suggested: push fluids, rest and return office visit prn if symptoms persist or worsen.  Call or return to clinic prn if these symptoms worsen or fail to improve as anticipated.

## 2015-01-07 NOTE — Progress Notes (Signed)
Pre visit review using our clinic review tool, if applicable. No additional management support is needed unless otherwise documented below in the visit note. 

## 2015-01-13 ENCOUNTER — Other Ambulatory Visit: Payer: Self-pay | Admitting: Family Medicine

## 2015-01-14 ENCOUNTER — Ambulatory Visit: Payer: Self-pay | Admitting: Oncology

## 2015-01-16 ENCOUNTER — Other Ambulatory Visit: Payer: Self-pay | Admitting: Family Medicine

## 2015-01-24 ENCOUNTER — Encounter: Payer: Self-pay | Admitting: Nurse Practitioner

## 2015-01-24 ENCOUNTER — Ambulatory Visit (INDEPENDENT_AMBULATORY_CARE_PROVIDER_SITE_OTHER): Payer: Medicare Other | Admitting: Nurse Practitioner

## 2015-01-24 VITALS — BP 146/86 | HR 98 | Temp 97.5°F | Resp 14 | Ht 65.5 in | Wt 182.8 lb

## 2015-01-24 DIAGNOSIS — J0101 Acute recurrent maxillary sinusitis: Secondary | ICD-10-CM

## 2015-01-24 NOTE — Patient Instructions (Signed)
We will contact you about your referral to ENT.

## 2015-01-24 NOTE — Progress Notes (Signed)
Pre visit review using our clinic review tool, if applicable. No additional management support is needed unless otherwise documented below in the visit note. 

## 2015-01-24 NOTE — Progress Notes (Signed)
   Subjective:    Patient ID: [name], female    DOB: Mar 28, 1941, 74 y.o.   MRN: [id number]  HPI  [name] is a 74 yo female with a CC of yellow tissue with blood from left nostril.   1) Pt reports "tissue" feels like a solid from left nostril was "hard and yellow" with a streak of blood. Pt reports she was on Augmentin x 10 days and felt better. It has been a few days and she reports starting yesterday she now has left ear pressure, her left eye and maxillary sinus "feels sensitive". Pt has tried Alka-seltzer plus with some relief and nothing else since the antibiotic.    Review of Systems  Constitutional: Negative for fever, chills, diaphoresis and fatigue.  HENT: Positive for congestion, nosebleeds and sinus pressure. Negative for ear discharge, ear pain, facial swelling, hearing loss, postnasal drip, rhinorrhea, sneezing, sore throat, tinnitus, trouble swallowing and voice change.        Ear pressure  Eyes: Negative for visual disturbance.  Respiratory: Negative for chest tightness, shortness of breath and wheezing.   Cardiovascular: Negative for chest pain, palpitations and leg swelling.  Gastrointestinal: Negative for nausea, vomiting and diarrhea.  Skin: Negative for rash.  Neurological: Negative for dizziness, weakness, numbness and headaches.  Psychiatric/Behavioral: The patient is not nervous/anxious.        Objective:   Physical Exam  Constitutional: She is oriented to person, place, and time. She appears well-developed and well-nourished. No distress.  BP 146/86 mmHg  Pulse 98  Temp(Src) 97.5 F (36.4 C) (Oral)  Resp 14  Ht 5' 5.5" (1.664 m)  Wt 182 lb 12.8 oz (82.918 kg)  BMI 29.95 kg/m2  SpO2 98%   HENT:  Head: Normocephalic and atraumatic.  Right Ear: External ear normal.  Left Ear: External ear normal.  TM's clear bilaterally  Cardiovascular: Normal rate, regular rhythm, normal heart sounds and intact distal pulses.  Exam reveals no gallop and no  friction rub.   No murmur heard. Pulmonary/Chest: Effort normal and breath sounds normal. No respiratory distress. She has no wheezes. She has no rales. She exhibits no tenderness.  Neurological: She is alert and oriented to person, place, and time. No cranial nerve deficit. She exhibits normal muscle tone. Coordination normal.  Skin: Skin is warm and dry. No rash noted. She is not diaphoretic.  Psychiatric: She has a normal mood and affect. Her behavior is normal. Judgment and thought content normal.         Assessment & Plan:

## 2015-01-26 DIAGNOSIS — J0101 Acute recurrent maxillary sinusitis: Secondary | ICD-10-CM | POA: Insufficient documentation

## 2015-01-26 NOTE — Assessment & Plan Note (Signed)
Pt having rebound symptoms since being treated. Unsure if this time it is ABRS due to only 1 day of symptoms and "sensitivity". Will refer to ENT to see if there is another etiology. Told pt to try nasal rinses and she states she does not want to try this. FU with provider if failure to improve/worsening or new symptoms.

## 2015-01-29 ENCOUNTER — Other Ambulatory Visit: Payer: Self-pay

## 2015-01-29 ENCOUNTER — Telehealth: Payer: Self-pay

## 2015-01-29 MED ORDER — FLUTICASONE PROPIONATE 50 MCG/ACT NA SUSP: 2.0000 | Freq: Every day | NASAL | Status: DC

## 2015-01-29 NOTE — Telephone Encounter (Signed)
Pt left v/m; pt has some sinus and nasal issues and pt request refill flonase to [facility]. Please advise.

## 2015-02-04 NOTE — Telephone Encounter (Signed)
error 

## 2015-02-24 ENCOUNTER — Other Ambulatory Visit: Payer: Self-pay | Admitting: Family Medicine

## 2015-03-24 ENCOUNTER — Other Ambulatory Visit: Payer: Self-pay | Admitting: Family Medicine

## 2015-03-24 ENCOUNTER — Other Ambulatory Visit: Payer: Self-pay | Admitting: Internal Medicine

## 2015-04-08 ENCOUNTER — Ambulatory Visit (INDEPENDENT_AMBULATORY_CARE_PROVIDER_SITE_OTHER): Payer: Medicare Other | Admitting: Primary Care

## 2015-04-08 ENCOUNTER — Encounter: Payer: Self-pay | Admitting: Primary Care

## 2015-04-08 VITALS — BP 132/86 | HR 98 | Temp 98.2°F | Ht 65.5 in | Wt 183.8 lb

## 2015-04-08 DIAGNOSIS — N390 Urinary tract infection, site not specified: Secondary | ICD-10-CM

## 2015-04-08 DIAGNOSIS — R300 Dysuria: Secondary | ICD-10-CM

## 2015-04-08 LAB — POCT URINALYSIS DIPSTICK
Glucose, UA: NEGATIVE
NITRITE UA: POSITIVE
RBC UA: NEGATIVE
SPEC GRAV UA: 1.015
UROBILINOGEN UA: 2
pH, UA: 6.5

## 2015-04-08 MED ORDER — NITROFURANTOIN MONOHYD MACRO 100 MG PO CAPS: 100.0000 mg | ORAL_CAPSULE | Freq: Two times a day (BID) | ORAL | Status: DC

## 2015-04-08 NOTE — Progress Notes (Signed)
Subjective:    Patient ID: [name], female    DOB: 1941-07-15, 74 y.o.   MRN: [id number]  HPI  [name] is a 74 year old female who presents today with a chief complaint of lower back pain, urinary frequency, and dysruia since 3am this morning.  Over the past 2-3 weeks she's felt achy to her lower back but didn't believe it to be a UTI due to absence of other symptoms and her recent yard work including gardening and Midwife beds. She's taken OTC "urinary pain" medication with temporary relief. Her symptoms have worsened this afternoon as she suspects a UTI.   Review of Systems  Constitutional: Negative for fever and chills.  Respiratory: Negative for shortness of breath.   Cardiovascular: Negative for chest pain.  Gastrointestinal: Negative for nausea, vomiting and abdominal pain.  Genitourinary: Positive for dysuria, urgency and flank pain. Negative for hematuria and difficulty urinating.       Past Medical History  Diagnosis Date  . Diabetes mellitus   . Hypertension   . Thyroid disease   . Osteoporosis     History   Social History  . Marital Status: Married    Spouse Name: N/A  . Number of Children: 5  . Years of Education: N/A   Occupational History  . Retired Pharmacist, hospital    Social History Main Topics  . Smoking status: Never Smoker   . Smokeless tobacco: Never Used  . Alcohol Use: No  . Drug Use: No  . Sexual Activity: Not on file   Other Topics Concern  . Not on file   Social History Narrative   Married 50th anniversary (8/08)   Has a Living Will- all of her children have a copy of it.   Desires CPR but would not want prolonged life support.                Past Surgical History  Procedure Laterality Date  . Mumps  1965    h/o  . Miscarriage  1970  . Septoplasty  1970  . Appendectomy    . Vaginal hysterectomy  1974    partial, fibroids  . Tonsillectomy  1945  . Foot surgery  1992 & 1994    bilateral  . Back surgery  1983   discectomy  . Ventral hernia repair    . 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 Colonoscopy    . Vaginal delivery      x5  . Other surgical history  04/03/02    dexa, wnl  . Other surgical history  03/12/04    dexa, wnl  . Doppler echocardiography  02/24/06    Triv MR, EF 55-60%  . Other surgical history  08/12/05    dexa-osteopor Hip, osteopen spine  . Cartoid u/s  04/16/68    79-48% LICA stenosis  . Other surgical history  10/25/07    dexa- nml hip and spine    Family History  Problem Relation Age of Onset  . Heart failure Father   . Hypertension Father   . Stroke Father   . Lung cancer Brother     hx of smoking  . Diabetes Mother   . Hypertension Mother   . Heart attack Mother   . Stroke Brother   . Heart attack Brother     PTCA MI x 2    Allergies  Allergen Reactions  . Atorvastatin     Leg swelling  .  Azithromycin Diarrhea and Nausea And Vomiting  . Ciprofloxacin     tendonitis  . Hydrocodone-Homatropine     REACTION: DROVE HER UP THE WALL  . Rosiglitazone Maleate     REACTION: SWELLING    Current Outpatient Prescriptions on File Prior to Visit  Medication Sig Dispense Refill  . acetaminophen (TYLENOL ARTHRITIS PAIN) 650 MG CR tablet Take 1,300 mg by mouth every 8 (eight) hours as needed for pain.    Marland Kitchen aspirin 81 MG tablet Take 81 mg by mouth daily.      . BD PEN NEEDLE NANO U/F 32G X 4 MM MISC USE TO INJECT INSULIN DAILY 100 each 1  . Blood Glucose Monitoring Suppl (ACCU-CHEK AVIVA PLUS) W/DEVICE KIT Use to check blood sugar twice daily     . clobetasol ointment (TEMOVATE) 0.05 % Apply nightly to irritated area x 2 weeks. 30 g 0  . cyanocobalamin (,VITAMIN B-12,) 1000 MCG/ML injection INJECT 1 ML ONCE A MONTH 1 mL 1  . diphenhydramine-acetaminophen (TYLENOL PM) 25-500 MG TABS Take 1/2 tablet by mouth at bedtime as needed     . fluticasone (FLONASE) 50 MCG/ACT nasal spray Place 2 sprays into both nostrils daily. 16 g 6  . glucose  blood (ACCU-CHEK AVIVA PLUS) test strip Use as directed to check BS twice daily E11.9 100 each 0  . guaiFENesin (MUCINEX) 600 MG 12 hr tablet as needed.      . Lancets (ACCU-CHEK MULTICLIX) lancets Use as instructed     . LANTUS SOLOSTAR 100 UNIT/ML Solostar Pen INJECT 26 UNITS (0.2 MLS) INTO THE SKIN AT BEDTIME 6 mL 5  . LASTACAFT 0.25 % SOLN     . meloxicam (MOBIC) 7.5 MG tablet     . metFORMIN (GLUCOPHAGE) 1000 MG tablet Take 1,000 mg by mouth daily with lunch.     . Misc Natural Products (TART CHERRY ADVANCED PO) Take by mouth.    . Multiple Vitamins-Minerals (CENTRUM SILVER PO) Take 1 tablet by mouth daily.      . naproxen sodium (ANAPROX) 220 MG tablet Take 220 mg by mouth 2 (two) times daily with a meal.    . omeprazole (PRILOSEC) 20 MG capsule TAKE ONE CAPSULE BY MOUTH DAILY 30 capsule 5  . Phenyleph-Doxylamine-DM-APAP (ALKA SELTZER PLUS PO) Take by mouth as needed.    . sulfamethoxazole-trimethoprim (BACTRIM DS) 800-160 MG per tablet Take 1 tablet by mouth 2 (two) times daily. 14 tablet 0  . valsartan-hydrochlorothiazide (DIOVAN-HCT) 160-25 MG per tablet TAKE ONE (1) TABLET BY MOUTH EVERY DAY 90 tablet 1  . VICTOZA 18 MG/3ML SOPN INJECT 1.8MG INTO THE SKIN DAILY 9 mL 0  . vitamin C (ASCORBIC ACID) 500 MG tablet Take 500 mg by mouth 2 (two) times daily.      Marland Kitchen ZETIA 10 MG tablet TAKE ONE (1) TABLET EACH DAY 30 tablet 0   No current facility-administered medications on file prior to visit.    BP 132/86 mmHg  Pulse 98  Temp(Src) 98.2 F (36.8 C) (Oral)  Ht 5' 5.5" (1.664 m)  Wt 183 lb 12.8 oz (83.371 kg)  BMI 30.11 kg/m2  SpO2 96%    Objective:   Physical Exam  Constitutional: She is oriented to person, place, and time. She appears well-developed.  Does not appear sickly  Neck: Neck supple.  Cardiovascular: Normal rate and regular rhythm.   Pulmonary/Chest: Effort normal and breath sounds normal.  Abdominal: Soft. Bowel sounds are normal. There is no tenderness. There is  CVA tenderness.  Lymphadenopathy:    She has no cervical adenopathy.  Neurological: She is alert and oriented to person, place, and time.  Skin: Skin is warm and dry.  Psychiatric: She has a normal mood and affect.          Assessment & Plan:

## 2015-04-08 NOTE — Patient Instructions (Signed)
Start Macrobid antibiotics for your urinary tract infection. Take one capsule by mouth twice daily for 7 days. You may continue to take your OTC medication for pain and spasms. Please notify me if you have no improvement in your symptoms in the next 3-4 days. It was a pleasure meeting you today!  Urinary Tract Infection Urinary tract infections (UTIs) can develop anywhere along your urinary tract. Your urinary tract is your body's drainage system for removing wastes and extra water. Your urinary tract includes two kidneys, two ureters, a bladder, and a urethra. Your kidneys are a pair of bean-shaped organs. Each kidney is about the size of your fist. They are located below your ribs, one on each side of your spine. CAUSES Infections are caused by microbes, which are microscopic organisms, including fungi, viruses, and bacteria. These organisms are so small that they can only be seen through a microscope. Bacteria are the microbes that most commonly cause UTIs. SYMPTOMS  Symptoms of UTIs may vary by age and gender of the patient and by the location of the infection. Symptoms in young women typically include a frequent and intense urge to urinate and a painful, burning feeling in the bladder or urethra during urination. Older women and men are more likely to be tired, shaky, and weak and have muscle aches and abdominal pain. A fever may mean the infection is in your kidneys. Other symptoms of a kidney infection include pain in your back or sides below the ribs, nausea, and vomiting. DIAGNOSIS To diagnose a UTI, your caregiver will ask you about your symptoms. Your caregiver also will ask to provide a urine sample. The urine sample will be tested for bacteria and white blood cells. White blood cells are made by your body to help fight infection. TREATMENT  Typically, UTIs can be treated with medication. Because most UTIs are caused by a bacterial infection, they usually can be treated with the use of  antibiotics. The choice of antibiotic and length of treatment depend on your symptoms and the type of bacteria causing your infection. HOME CARE INSTRUCTIONS  If you were prescribed antibiotics, take them exactly as your caregiver instructs you. Finish the medication even if you feel better after you have only taken some of the medication.  Drink enough water and fluids to keep your urine clear or pale yellow.  Avoid caffeine, tea, and carbonated beverages. They tend to irritate your bladder.  Empty your bladder often. Avoid holding urine for long periods of time.  Empty your bladder before and after sexual intercourse.  After a bowel movement, women should cleanse from front to back. Use each tissue only once. SEEK MEDICAL CARE IF:   You have back pain.  You develop a fever.  Your symptoms do not begin to resolve within 3 days. SEEK IMMEDIATE MEDICAL CARE IF:   You have severe back pain or lower abdominal pain.  You develop chills.  You have nausea or vomiting.  You have continued burning or discomfort with urination. MAKE SURE YOU:   Understand these instructions.  Will watch your condition.  Will get help right away if you are not doing well or get worse. Document Released: 09/08/2005 Document Revised: 05/30/2012 Document Reviewed: 01/07/2012 [facility] Patient Information 2015 [place], [place]. This information is not intended to replace advice given to you by your health care provider. Make sure you discuss any questions you have with your health care provider.

## 2015-04-08 NOTE — Assessment & Plan Note (Signed)
UA positive for leuks and nitrites. Treated with Macrobid BID for 7 days. She declines pyridium as she has some OTC medication that provides relief. Will send culture. Follow up if no improvement in 3-4 days.

## 2015-04-08 NOTE — Progress Notes (Signed)
Pre visit review using our clinic review tool, if applicable. No additional management support is needed unless otherwise documented below in the visit note. 

## 2015-04-11 ENCOUNTER — Other Ambulatory Visit: Payer: Self-pay | Admitting: Primary Care

## 2015-04-11 ENCOUNTER — Telehealth: Payer: Self-pay | Admitting: Primary Care

## 2015-04-11 DIAGNOSIS — N390 Urinary tract infection, site not specified: Secondary | ICD-10-CM

## 2015-04-11 DIAGNOSIS — R319 Hematuria, unspecified: Principal | ICD-10-CM

## 2015-04-11 LAB — URINE CULTURE

## 2015-04-11 MED ORDER — CEPHALEXIN 500 MG PO CAPS: 500.0000 mg | ORAL_CAPSULE | Freq: Two times a day (BID) | ORAL | Status: DC

## 2015-04-18 ENCOUNTER — Telehealth: Payer: Self-pay

## 2015-04-18 MED ORDER — FLUCONAZOLE 150 MG PO TABS: 150.0000 mg | ORAL_TABLET | Freq: Once | ORAL | Status: DC

## 2015-04-18 NOTE — Telephone Encounter (Signed)
Patient advised.

## 2015-04-18 NOTE — Telephone Encounter (Signed)
Pt left v/m requesting rx for yeast infection after taking abx to [facility].

## 2015-04-18 NOTE — Telephone Encounter (Signed)
I sent diflucan to her pharmacy F/u if no imp

## 2015-04-21 ENCOUNTER — Other Ambulatory Visit: Payer: Self-pay | Admitting: Family Medicine

## 2015-04-28 ENCOUNTER — Ambulatory Visit (INDEPENDENT_AMBULATORY_CARE_PROVIDER_SITE_OTHER): Payer: Medicare Other | Admitting: Family Medicine

## 2015-04-28 ENCOUNTER — Encounter: Payer: Self-pay | Admitting: Family Medicine

## 2015-04-28 VITALS — BP 126/64 | HR 94 | Temp 97.7°F | Wt 181.2 lb

## 2015-04-28 DIAGNOSIS — M8580 Other specified disorders of bone density and structure, unspecified site: Secondary | ICD-10-CM | POA: Insufficient documentation

## 2015-04-28 DIAGNOSIS — Z79899 Other long term (current) drug therapy: Secondary | ICD-10-CM

## 2015-04-28 DIAGNOSIS — I10 Essential (primary) hypertension: Secondary | ICD-10-CM

## 2015-04-28 DIAGNOSIS — N760 Acute vaginitis: Secondary | ICD-10-CM | POA: Insufficient documentation

## 2015-04-28 DIAGNOSIS — E119 Type 2 diabetes mellitus without complications: Secondary | ICD-10-CM

## 2015-04-28 DIAGNOSIS — C50919 Malignant neoplasm of unspecified site of unspecified female breast: Secondary | ICD-10-CM

## 2015-04-28 DIAGNOSIS — E785 Hyperlipidemia, unspecified: Secondary | ICD-10-CM

## 2015-04-28 DIAGNOSIS — B964 Proteus (mirabilis) (morganii) as the cause of diseases classified elsewhere: Secondary | ICD-10-CM

## 2015-04-28 DIAGNOSIS — R5383 Other fatigue: Secondary | ICD-10-CM | POA: Insufficient documentation

## 2015-04-28 DIAGNOSIS — K649 Unspecified hemorrhoids: Secondary | ICD-10-CM | POA: Insufficient documentation

## 2015-04-28 DIAGNOSIS — N390 Urinary tract infection, site not specified: Secondary | ICD-10-CM | POA: Diagnosis not present

## 2015-04-28 DIAGNOSIS — R195 Other fecal abnormalities: Secondary | ICD-10-CM | POA: Insufficient documentation

## 2015-04-28 DIAGNOSIS — R197 Diarrhea, unspecified: Secondary | ICD-10-CM

## 2015-04-28 MED ORDER — TERCONAZOLE 0.4 % VA CREA: 1.0000 | TOPICAL_CREAM | Freq: Every day | VAGINAL | Status: DC

## 2015-04-28 NOTE — Progress Notes (Signed)
74 yo female with h/o breast CA, DM here for follow up of chronic medical conditions/rx refills.  Feels she is "falling apart." "I just know something is wrong--I'm just tired."  Saw Katie Clark on 4/26 for UTI symptoms- note reviewed- UA pos for LE and nitrites. Treated with Macrobid twice daily x 7 days. Urine cx grew > 100,000 proteus resistant to macrobid.  Rx for Keflex sent in. Then developed diarrhea.  Now taking probiotics which has helped but she feels her stools are too firm. She is no longer experiencing dysuria or other UTI symptoms. She is having some vaginal irritation now.  Took one dose of oral diflucan- symptoms improved a little but are still present.   Hypothyroidism- denies any symptoms of hypo or hyperthyroidism other than fatigue. Lab Results  Component Value Date   TSH 2.45 10/01/2014    DM- Was seeing Dr. Gherghe but no longer seeing endocrinology.  Her sugars have improved after addition of Victoza and cutting portions and losing weight, and sugars improved more after increasing Victoza to 1.8.   Checks FSBS- averaging between 77-90.   Last saw her in 03/2014- note reviewed. Wt Readings from Last 3 Encounters:  04/28/15 181 lb 4 oz (82.214 kg)  04/08/15 183 lb 12.8 oz (83.371 kg)  01/24/15 182 lb 12.8 oz (82.918 kg)    Lab Results  Component Value Date   HGBA1C 6.9* 08/23/2014   HLD- intolerant to statins . Taking Zetia 10 mg daily. Lab Results  Component Value Date   CHOL 192 10/01/2014   HDL 51.80 10/01/2014   LDLCALC 113* 10/01/2014   TRIG 137.0 10/01/2014   CHOLHDL 4 10/01/2014   Lab Results  Component Value Date   CHOL 192 10/01/2014   HDL 51.80 10/01/2014   LDLCALC 113* 10/01/2014   TRIG 137.0 10/01/2014   CHOLHDL 4 10/01/2014    BP- on Diovan- HCT. Well controlled today.   Denies any HA, burred vision or LE edema. Lab Results  Component Value Date   CREATININE 0.8 10/01/2014     H/o breast cancer- 15 years out.  Was seeing Dr.  Chokski but will be discharged since she has been doing so well.  Per pt, her mammogram is UTD.   Staying active- working out in the garden which helps to combat her anxiety.   Patient Active Problem List   Diagnosis Date Noted  . 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 HIATAL HERNIA 02/10/2010  . ALLERGY, ENVIRONMENTAL 04/29/2009  . Malignant neoplasm of female breast 04/26/2007  . Diabetes 04/26/2007  . Essential hypertension 04/26/2007   Past Medical History  Diagnosis Date  . Diabetes mellitus   . Hypertension   . Thyroid disease   . Osteoporosis    Past Surgical History  Procedure Laterality Date  . Mumps  1965    h/o  . Miscarriage  1970  . Septoplasty  1970  . Appendectomy    . Vaginal hysterectomy  1974    partial, fibroids  . Tonsillectomy  1945  . Foot surgery  1992 & 1994    bilateral  . Back surgery  1983    discectomy  . Ventral hernia repair    . 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 Colonoscopy    . Vaginal delivery      x5  . Other surgical history  04/03/02    dexa, wnl  . Other surgical history  03/12/04      dexa, wnl  . Doppler echocardiography  02/24/06    Triv MR, EF 55-60%  . Other surgical history  08/12/05    dexa-osteopor Hip, osteopen spine  . 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 Other surgical history  10/25/07    dexa- nml hip and spine   History  Substance Use Topics  . Smoking status: Never Smoker   . Smokeless tobacco: Never Used  . Alcohol Use: No   Family History  Problem Relation Age of Onset  . Heart failure Father   . Hypertension Father   . Stroke Father   . Lung cancer Brother     hx of smoking  . Diabetes Mother   . Hypertension Mother   . Heart attack Mother   . Stroke Brother   . Heart attack Brother     PTCA MI x 2   Allergies  Allergen Reactions  . Atorvastatin     Leg swelling  . Azithromycin Diarrhea and Nausea And Vomiting  . Ciprofloxacin      tendonitis  . Hydrocodone-Homatropine     REACTION: DROVE HER UP THE WALL  . Rosiglitazone Maleate     REACTION: SWELLING   Current Outpatient Prescriptions on File Prior to Visit  Medication Sig Dispense Refill  . acetaminophen (TYLENOL ARTHRITIS PAIN) 650 MG CR tablet Take 1,300 mg by mouth every 8 (eight) hours as needed for pain.    . aspirin 81 MG tablet Take 81 mg by mouth daily.      . BD PEN NEEDLE NANO U/F 32G X 4 MM MISC USE TO INJECT INSULIN DAILY 100 each 1  . Blood Glucose Monitoring Suppl (ACCU-CHEK AVIVA PLUS) W/DEVICE KIT Use to check blood sugar twice daily     . clobetasol ointment (TEMOVATE) 0.05 % Apply nightly to irritated area x 2 weeks. 30 g 0  . cyanocobalamin (,VITAMIN B-12,) 1000 MCG/ML injection INJECT 1 ML ONCE A MONTH 1 mL 1  . diphenhydramine-acetaminophen (TYLENOL PM) 25-500 MG TABS Take 1/2 tablet by mouth at bedtime as needed     . fluticasone (FLONASE) 50 MCG/ACT nasal spray Place 2 sprays into both nostrils daily. 16 g 6  . glucose blood (ACCU-CHEK AVIVA PLUS) test strip Use as directed to check BS twice daily E11.9 100 each 0  . guaiFENesin (MUCINEX) 600 MG 12 hr tablet as needed.      . Lancets (ACCU-CHEK MULTICLIX) lancets Use as instructed     . LANTUS SOLOSTAR 100 UNIT/ML Solostar Pen INJECT 26 UNITS (0.2 MLS) INTO THE SKIN AT BEDTIME 6 mL 5  . LASTACAFT 0.25 % SOLN     . meloxicam (MOBIC) 7.5 MG tablet     . metFORMIN (GLUCOPHAGE) 1000 MG tablet Take 1,000 mg by mouth daily with lunch.     . Misc Natural Products (TART CHERRY ADVANCED PO) Take by mouth.    . Multiple Vitamins-Minerals (CENTRUM SILVER PO) Take 1 tablet by mouth daily.      . naproxen sodium (ANAPROX) 220 MG tablet Take 220 mg by mouth 2 (two) times daily with a meal.    . omeprazole (PRILOSEC) 20 MG capsule TAKE ONE CAPSULE BY MOUTH DAILY 30 capsule 5  . Phenyleph-Doxylamine-DM-APAP (ALKA SELTZER PLUS PO) Take by mouth as needed.    . valsartan-hydrochlorothiazide (DIOVAN-HCT)  160-25 MG per tablet TAKE ONE (1) TABLET BY MOUTH EVERY DAY 90 tablet 1  . VICTOZA 18 MG/3ML SOPN INJECT 1.8 MG INTO THE SKIN DAILY.   OFFICE VISIT WITH LABS REQUIRED FOR ADDITIONAL REFILLS 9 mL 0  . vitamin C (ASCORBIC ACID) 500 MG tablet Take 500 mg by mouth 2 (two) times daily.      Marland Kitchen ZETIA 10 MG tablet TAKE ONE (1) TABLET EACH DAY 30 tablet 0   No current facility-administered medications on file prior to visit.     The PMH, PSH, Social History, Family History, Medications, and allergies have been reviewed in [facility], and have been updated if relevant.   Review of Systems      Review of Systems  Constitutional: Positive for fatigue.  HENT: Negative.   Eyes: Negative.   Respiratory: Negative.   Cardiovascular: Negative.   Gastrointestinal: Negative.   Endocrine: Negative.   Genitourinary:       +vaginal irritation  Musculoskeletal: Positive for myalgias, back pain, arthralgias and neck pain.  Skin: Negative.   Allergic/Immunologic: Negative.   Neurological: Negative.   Hematological: Negative.   Psychiatric/Behavioral: Negative.   All other systems reviewed and are negative.    Physical Exam BP 126/64 mmHg  Pulse 94  Temp(Src) 97.7 F (36.5 C) (Oral)  Wt 181 lb 4 oz (82.214 kg)  SpO2 98% Wt Readings from Last 3 Encounters:  04/28/15 181 lb 4 oz (82.214 kg)  04/08/15 183 lb 12.8 oz (83.371 kg)  01/24/15 182 lb 12.8 oz (82.918 kg)     General:  Well-developed,well-nourished,in no acute distress; alert,appropriate and cooperative throughout examination, mildly overweight. Head:  Normocephalic and atraumatic without obvious abnormalities. Wears wig due to past chemo. Eyes:  Conjunctiva clear bilaterally.  Ears:  External ear exam shows no significant lesions or deformities.  Otoscopic examination reveals clear canals, tympanic membranes are intact bilaterally without bulging, retraction, inflammation or discharge. Hearing is grossly normal bilaterally. Nose:  External  nasal examination shows no deformity or inflammation. Nasal mucosa are pink and moist without lesions or exudates. Mouth:  Oral mucosa and oropharynx without lesions or exudates.  Teeth in good repair. Neck:  No deformities, masses, or tenderness noted. Lungs:  Normal respiratory effort, chest expands symmetrically. Lungs are clear to auscultation, no crackles or wheezes. Heart:  Normal rate and regular rhythm. S1 and S2 normal without gallop, murmur, click, rub or other extra sounds. Abdomen:  Bowel sounds positive,abdomen soft and non-tender without masses, organomegaly or hernias noted. Neurologic:  No cranial nerve deficits noted. Station and gait are normal. Plantar reflexes are down-going bilaterally. DTRs are symmetrical throughout. Sensory, motor and coordinative functions appear intact.

## 2015-04-28 NOTE — Assessment & Plan Note (Signed)
Deteriorated- likely multifactorial. She denies feeling depressed but she is emotional and histrionic again during OV today. Check labs as part of initial work up. The patient indicates understanding of these issues and agrees with the plan.  Orders Placed This Encounter  Procedures  . DG Bone Density  . Microalbumin / creatinine urine ratio  . Hemoglobin A1c  . Lipid panel  . Comprehensive metabolic panel  . Vitamin B12  . CBC with Differential/Platelet  . Vitamin D, 25-hydroxy

## 2015-04-28 NOTE — Assessment & Plan Note (Signed)
Intolerant to statins. Continue zetia. Recheck lipid panel today.

## 2015-04-28 NOTE — Progress Notes (Signed)
Pre visit review using our clinic review tool, if applicable. No additional management support is needed unless otherwise documented below in the visit note. 

## 2015-04-28 NOTE — Assessment & Plan Note (Signed)
New- deferred GYN exam. Since oral diflucan did help a little- presumed vaginal yeast infection secondary to recent abx use. eRx sent for topical terazol. Call or return to clinic prn if these symptoms worsen or fail to improve as anticipated. The patient indicates understanding of these issues and agrees with the plan.

## 2015-04-28 NOTE — Assessment & Plan Note (Signed)
Refusing to see endo. Check a1c and urine micro today. Refusing statin. Per pt, eye exam scheduled. pneumococcal vaccinations up to date. No changes made to rxs today.

## 2015-04-28 NOTE — Assessment & Plan Note (Signed)
Well controlled on current rx. No changes made today. 

## 2015-04-28 NOTE — Patient Instructions (Signed)
Great to see you. I will call you with your lab results.  Please stop by to see Rosaria Ferries on your way out to set up your bone density.

## 2015-04-28 NOTE — Addendum Note (Signed)
Addended by: Lucille Passy on: 04/28/2015 10:37 AM   Modules accepted: Level of Service

## 2015-04-28 NOTE — Assessment & Plan Note (Signed)
Finishing course of Keflex. Symptoms have resolved.

## 2015-04-28 NOTE — Assessment & Plan Note (Signed)
Discussed using colace daily or prn. Call or return to clinic prn if these symptoms worsen or fail to improve as anticipated. The patient indicates understanding of these issues and agrees with the plan.

## 2015-04-29 LAB — COMPREHENSIVE METABOLIC PANEL
ALK PHOS: 49 U/L (ref 39–117)
ALT: 15 U/L (ref 0–35)
AST: 23 U/L (ref 0–37)
Albumin: 3.8 g/dL (ref 3.5–5.2)
BUN: 14 mg/dL (ref 6–23)
CHLORIDE: 92 meq/L — AB (ref 96–112)
CO2: 28 mEq/L (ref 19–32)
Calcium: 9.6 mg/dL (ref 8.4–10.5)
Creatinine, Ser: 0.72 mg/dL (ref 0.40–1.20)
GFR: 83.72 mL/min (ref >60.00)
Glucose, Bld: 158 mg/dL — ABNORMAL HIGH (ref 70–99)
Potassium: 3.8 mEq/L (ref 3.5–5.1)
Sodium: 128 mEq/L — ABNORMAL LOW (ref 135–145)
Total Bilirubin: 0.4 mg/dL (ref 0.2–1.2)
Total Protein: 6.8 g/dL (ref 6.0–8.3)

## 2015-04-29 LAB — CBC WITH DIFFERENTIAL/PLATELET
BASOS PCT: 0.6 % (ref 0.0–3.0)
Basophils Absolute: 0.1 10*3/uL (ref 0.0–0.1)
EOS PCT: 1.3 % (ref 0.0–5.0)
Eosinophils Absolute: 0.1 10*3/uL (ref 0.0–0.7)
HCT: 36.1 % (ref 36.0–46.0)
Hemoglobin: 12.1 g/dL (ref 12.0–15.0)
LYMPHS PCT: 18.1 % (ref 12.0–46.0)
Lymphs Abs: 1.6 10*3/uL (ref 0.7–4.0)
MCHC: 33.4 g/dL (ref 30.0–36.0)
MCV: 88 fl (ref 78.0–100.0)
Monocytes Absolute: 0.6 10*3/uL (ref 0.1–1.0)
Monocytes Relative: 7.3 % (ref 3.0–12.0)
NEUTROS PCT: 72.7 % (ref 43.0–77.0)
Neutro Abs: 6.3 10*3/uL (ref 1.4–7.7)
Platelets: 391 10*3/uL (ref 150.0–400.0)
RBC: 4.1 Mil/uL (ref 3.87–5.11)
RDW: 13 % (ref 11.5–15.5)
WBC: 8.6 10*3/uL (ref 4.0–10.5)

## 2015-04-29 LAB — VITAMIN B12: Vitamin B-12: 563 pg/mL (ref 211–911)

## 2015-04-29 LAB — MICROALBUMIN / CREATININE URINE RATIO
Creatinine,U: 62.5 mg/dL
Microalb Creat Ratio: 1.1 mg/g (ref 0.0–30.0)
Microalb, Ur: <0.7 mg/dL (ref 0.0–1.9)

## 2015-04-29 LAB — VITAMIN D 25 HYDROXY (VIT D DEFICIENCY, FRACTURES): VITD: 57.27 ng/mL (ref 30.00–100.00)

## 2015-04-29 LAB — LIPID PANEL
CHOLESTEROL: 181 mg/dL (ref 0–200)
HDL: 45.5 mg/dL (ref >39.00)
LDL CALC: 103 mg/dL — AB (ref 0–99)
NonHDL: 135.5
Total CHOL/HDL Ratio: 4
Triglycerides: 163 mg/dL — ABNORMAL HIGH (ref 0.0–149.0)
VLDL: 32.6 mg/dL (ref 0.0–40.0)

## 2015-04-29 LAB — HEMOGLOBIN A1C: HEMOGLOBIN A1C: 6.7 % — AB (ref 4.6–6.5)

## 2015-04-30 ENCOUNTER — Other Ambulatory Visit: Payer: Self-pay | Admitting: Family Medicine

## 2015-04-30 DIAGNOSIS — E871 Hypo-osmolality and hyponatremia: Secondary | ICD-10-CM

## 2015-04-30 MED ORDER — EZETIMIBE 10 MG PO TABS: ORAL_TABLET | ORAL | Status: DC

## 2015-04-30 NOTE — Addendum Note (Signed)
Addended by: Modena Nunnery on: 04/30/2015 03:35 PM   Modules accepted: Orders

## 2015-05-06 ENCOUNTER — Ambulatory Visit
Admission: RE | Admit: 2015-05-06 | Discharge: 2015-05-06 | Disposition: A | Discharge status: Home / Self Care | Payer: Medicare Other | Source: Ambulatory Visit | Attending: Family Medicine | Admitting: Family Medicine

## 2015-05-06 DIAGNOSIS — Z8739 Personal history of other diseases of the musculoskeletal system and connective tissue: Secondary | ICD-10-CM | POA: Insufficient documentation

## 2015-05-06 DIAGNOSIS — M8580 Other specified disorders of bone density and structure, unspecified site: Secondary | ICD-10-CM

## 2015-05-07 ENCOUNTER — Encounter: Payer: Self-pay | Admitting: *Deleted

## 2015-05-08 ENCOUNTER — Other Ambulatory Visit: Payer: Medicare Other

## 2015-05-13 ENCOUNTER — Encounter: Payer: Self-pay | Admitting: *Deleted

## 2015-05-13 ENCOUNTER — Other Ambulatory Visit (INDEPENDENT_AMBULATORY_CARE_PROVIDER_SITE_OTHER): Payer: Medicare Other

## 2015-05-13 DIAGNOSIS — E871 Hypo-osmolality and hyponatremia: Secondary | ICD-10-CM

## 2015-05-13 LAB — BASIC METABOLIC PANEL
BUN: 20 mg/dL (ref 6–23)
CALCIUM: 9.4 mg/dL (ref 8.4–10.5)
CO2: 26 mEq/L (ref 19–32)
Chloride: 98 mEq/L (ref 96–112)
Creatinine, Ser: 0.73 mg/dL (ref 0.40–1.20)
GFR: 82.38 mL/min (ref >60.00)
Glucose, Bld: 152 mg/dL — ABNORMAL HIGH (ref 70–99)
Potassium: 4 mEq/L (ref 3.5–5.1)
SODIUM: 133 meq/L — AB (ref 135–145)

## 2015-05-21 ENCOUNTER — Other Ambulatory Visit: Payer: Self-pay | Admitting: Family Medicine

## 2015-05-30 ENCOUNTER — Other Ambulatory Visit: Payer: Self-pay | Admitting: Family Medicine

## 2015-07-01 ENCOUNTER — Telehealth: Payer: Self-pay

## 2015-07-01 NOTE — Telephone Encounter (Signed)
Pt left v/m; pt mistakenly threw away Zetia bottle; pt picked up Zetia at 06/23/15 from [facility]; pt cannot get more Zetia until 07/24/15. Pt wants to know if it is OK to go without med for that length of time or what should pt do? Pt request cb.

## 2015-07-02 NOTE — Telephone Encounter (Signed)
Spoke to pt who states she is not wanting to pay out of pocket for meds and will "do without them and work on (her) diet."

## 2015-07-02 NOTE — Telephone Encounter (Signed)
Can she not get a refill or is that she will have to pay out of pocket?

## 2015-07-02 NOTE — Telephone Encounter (Signed)
I cannot make that decision for her.  I would like for her to take the zetia but if not able to for a month, we will have to do without it.

## 2015-07-02 NOTE — Telephone Encounter (Signed)
Both. Her insurance will not pay before the month is up, so if she needs additional tabs, she will need a new Rx and pay out of pocket

## 2015-07-14 ENCOUNTER — Other Ambulatory Visit: Payer: Self-pay | Admitting: Family Medicine

## 2015-08-11 ENCOUNTER — Other Ambulatory Visit: Payer: Self-pay | Admitting: Family Medicine

## 2015-09-04 ENCOUNTER — Other Ambulatory Visit: Payer: Self-pay | Admitting: Family Medicine

## 2015-09-15 ENCOUNTER — Other Ambulatory Visit: Payer: Self-pay | Admitting: Family Medicine

## 2015-10-06 ENCOUNTER — Other Ambulatory Visit: Payer: Self-pay | Admitting: Family Medicine

## 2015-10-06 NOTE — Telephone Encounter (Signed)
04/2015 labs abnormal

## 2015-10-21 ENCOUNTER — Encounter: Payer: Self-pay | Admitting: Family Medicine

## 2015-10-21 ENCOUNTER — Ambulatory Visit (INDEPENDENT_AMBULATORY_CARE_PROVIDER_SITE_OTHER): Payer: Medicare Other | Admitting: Family Medicine

## 2015-10-21 VITALS — BP 134/62 | HR 91 | Temp 97.8°F | Ht 65.25 in | Wt 179.5 lb

## 2015-10-21 DIAGNOSIS — E785 Hyperlipidemia, unspecified: Secondary | ICD-10-CM

## 2015-10-21 DIAGNOSIS — R5383 Other fatigue: Secondary | ICD-10-CM | POA: Diagnosis not present

## 2015-10-21 DIAGNOSIS — Z0000 Encounter for general adult medical examination without abnormal findings: Secondary | ICD-10-CM | POA: Insufficient documentation

## 2015-10-21 DIAGNOSIS — R630 Anorexia: Secondary | ICD-10-CM | POA: Insufficient documentation

## 2015-10-21 DIAGNOSIS — Z23 Encounter for immunization: Secondary | ICD-10-CM | POA: Diagnosis not present

## 2015-10-21 DIAGNOSIS — I10 Essential (primary) hypertension: Secondary | ICD-10-CM

## 2015-10-21 DIAGNOSIS — K649 Unspecified hemorrhoids: Secondary | ICD-10-CM

## 2015-10-21 DIAGNOSIS — C50919 Malignant neoplasm of unspecified site of unspecified female breast: Secondary | ICD-10-CM | POA: Diagnosis not present

## 2015-10-21 DIAGNOSIS — E119 Type 2 diabetes mellitus without complications: Secondary | ICD-10-CM

## 2015-10-21 LAB — LIPID PANEL
CHOL/HDL RATIO: 4
CHOLESTEROL: 207 mg/dL — AB (ref 0–200)
HDL: 47 mg/dL (ref >39.00)
LDL CALC: 125 mg/dL — AB (ref 0–99)
NonHDL: 159.51
Triglycerides: 172 mg/dL — ABNORMAL HIGH (ref 0.0–149.0)
VLDL: 34.4 mg/dL (ref 0.0–40.0)

## 2015-10-21 LAB — CBC WITH DIFFERENTIAL/PLATELET
BASOS ABS: 0 10*3/uL (ref 0.0–0.1)
Basophils Relative: 0.4 % (ref 0.0–3.0)
EOS ABS: 0.1 10*3/uL (ref 0.0–0.7)
Eosinophils Relative: 1.6 % (ref 0.0–5.0)
HEMATOCRIT: 38.2 % (ref 36.0–46.0)
HEMOGLOBIN: 12.6 g/dL (ref 12.0–15.0)
LYMPHS PCT: 17.1 % (ref 12.0–46.0)
Lymphs Abs: 1.5 10*3/uL (ref 0.7–4.0)
MCHC: 33 g/dL (ref 30.0–36.0)
MCV: 88.3 fl (ref 78.0–100.0)
Monocytes Absolute: 0.7 10*3/uL (ref 0.1–1.0)
Monocytes Relative: 7.7 % (ref 3.0–12.0)
Neutro Abs: 6.6 10*3/uL (ref 1.4–7.7)
Neutrophils Relative %: 73.2 % (ref 43.0–77.0)
Platelets: 376 10*3/uL (ref 150.0–400.0)
RBC: 4.33 Mil/uL (ref 3.87–5.11)
RDW: 13.7 % (ref 11.5–15.5)
WBC: 8.9 10*3/uL (ref 4.0–10.5)

## 2015-10-21 LAB — HEMOGLOBIN A1C: Hgb A1c MFr Bld: 6.8 % — ABNORMAL HIGH (ref 4.6–6.5)

## 2015-10-21 LAB — TSH: TSH: 3.82 u[IU]/mL (ref 0.35–4.50)

## 2015-10-21 LAB — COMPREHENSIVE METABOLIC PANEL
ALBUMIN: 4.2 g/dL (ref 3.5–5.2)
ALT: 14 U/L (ref 0–35)
AST: 23 U/L (ref 0–37)
Alkaline Phosphatase: 43 U/L (ref 39–117)
BUN: 17 mg/dL (ref 6–23)
CHLORIDE: 95 meq/L — AB (ref 96–112)
CO2: 28 meq/L (ref 19–32)
CREATININE: 0.79 mg/dL (ref 0.40–1.20)
Calcium: 9.7 mg/dL (ref 8.4–10.5)
GFR: 75.12 mL/min (ref >60.00)
Glucose, Bld: 127 mg/dL — ABNORMAL HIGH (ref 70–99)
Potassium: 4.5 mEq/L (ref 3.5–5.1)
SODIUM: 132 meq/L — AB (ref 135–145)
Total Bilirubin: 0.6 mg/dL (ref 0.2–1.2)
Total Protein: 7.2 g/dL (ref 6.0–8.3)

## 2015-10-21 LAB — VITAMIN B12: VITAMIN B 12: 465 pg/mL (ref 211–911)

## 2015-10-21 LAB — VITAMIN D 25 HYDROXY (VIT D DEFICIENCY, FRACTURES): VITD: 58.89 ng/mL (ref 30.00–100.00)

## 2015-10-21 MED ORDER — INSULIN GLARGINE 100 UNIT/ML SOLOSTAR PEN: PEN_INJECTOR | SUBCUTANEOUS | Status: DC

## 2015-10-21 MED ORDER — LIRAGLUTIDE 18 MG/3ML ~~LOC~~ SOPN
PEN_INJECTOR | SUBCUTANEOUS | Status: DC
Start: 2015-10-21 — End: 2016-05-25

## 2015-10-21 NOTE — Addendum Note (Signed)
Addended by: Lucille Passy on: 10/21/2015 09:52 AM   Modules accepted: Miquel Dunn

## 2015-10-21 NOTE — Assessment & Plan Note (Signed)
Well controlled on current rx. No changes made. 

## 2015-10-21 NOTE — Assessment & Plan Note (Signed)
Mammogram UTD. 

## 2015-10-21 NOTE — Progress Notes (Signed)
74 yo female with h/o breast CA, DM here for medicare wellness visit and follow up of chronic medical conditions and with multiple complaints today  I have personally reviewed the Medicare Annual Wellness questionnaire and have noted 1. The patient's medical and social history 2. Their use of alcohol, tobacco or illicit drugs 3. Their current medications and supplements 4. The patient's functional ability including ADL's, fall risks, home safety risks and hearing or visual             impairment. 5. Diet and physical activities 6. Evidence for depression or mood disorders  End of life wishes discussed and updated in Social History.  The roster of all physicians providing medical care to patient - is listed in the Snapshot section of the chart.  Remote h/o hysterectomy Prevnar 13 03/13/14 Pneumovax 12/28/11 Td 08/01/08 Zoster 07/04/12 Colonoscopy 04/22/10- Dr. Vira Agar, 10 year recall. Last eye exam was 03/2015 - Dr. Gloriann Loan Mammogram 01/14/2015 s She feels tired, loss of appetite. She feels lack of appetite is because she is "too tired to cook."  Denies feeling depressed. Also feels like her legs are weak. She has been doing more gardening lately which is "good for her soul." Feels like feet are cold and cramping.  Wt Readings from Last 3 Encounters:  10/21/15 179 lb 8 oz (81.421 kg)  04/28/15 181 lb 4 oz (82.214 kg)  04/08/15 183 lb 12.8 oz (83.371 kg)     Hypothyroidism-  Lab Results  Component Value Date   TSH 2.45 10/01/2014    DM- No longer seeing Dr. Cruzita Lederer  Her sugars have improved after addition of Victoza and cutting portions and losing weight, and sugars improved more after increasing Victoza to 1.8.   Checks FSBS- averaging between 88-97.   Last saw her in 03/2014- note reviewed. Wt Readings from Last 3 Encounters:  10/21/15 179 lb 8 oz (81.421 kg)  04/28/15 181 lb 4 oz (82.214 kg)  04/08/15 183 lb 12.8 oz (83.371 kg)     Lab Results  Component Value Date   HGBA1C  6.7* 04/29/2015   HLD- taking Zetia 10 mg daily. Lab Results  Component Value Date   CHOL 181 04/29/2015   HDL 45.50 04/29/2015   LDLCALC 103* 04/29/2015   TRIG 163.0* 04/29/2015   CHOLHDL 4 04/29/2015    BP- on Diovan- HCT. Well controlled today.   Denies any HA, burred vision or LE edema. Lab Results  Component Value Date   CREATININE 0.73 05/13/2015    H/o breast cancer- 15 years out.  Seeing Dr. Tristan Schroeder.   Mammogram UTD.    Patient Active Problem List   Diagnosis Date Noted  . Medicare annual wellness visit, subsequent 10/21/2015  . Fatigue 10/21/2015  . Poor appetite 10/21/2015  . HLD (hyperlipidemia) 04/28/2015  . Osteopenia 04/28/2015  . 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 HIATAL HERNIA 02/10/2010  . ALLERGY, ENVIRONMENTAL 04/29/2009  . Malignant neoplasm of female breast ([place]) 04/26/2007  . Diabetes ([place]) 04/26/2007  . Essential hypertension 04/26/2007   Past Medical History  Diagnosis Date  . Diabetes mellitus   . Hypertension   . Thyroid disease   . Osteoporosis    Past Surgical History  Procedure Laterality Date  . Mumps  1965    h/o  . Miscarriage  1970  . Septoplasty  1970  . Appendectomy    . Vaginal hysterectomy  1974    partial, fibroids  . Tonsillectomy  1945  . Foot surgery  1992 &  1994    bilateral  . Back surgery  1983    discectomy  . Ventral hernia repair    . 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 Colonoscopy    . Vaginal delivery      x5  . Other surgical history  04/03/02    dexa, wnl  . Other surgical history  03/12/04    dexa, wnl  . Doppler echocardiography  02/24/06    Triv MR, EF 55-60%  . Other surgical history  08/12/05    dexa-osteopor Hip, osteopen spine  . Cartoid u/s  2/95/18    84-16% LICA stenosis  . Other surgical history  10/25/07    dexa- nml hip and spine   Social History  Substance Use Topics  . Smoking status: Never Smoker   . Smokeless tobacco: Never Used   . Alcohol Use: No   Family History  Problem Relation Age of Onset  . Heart failure Father   . Hypertension Father   . Stroke Father   . Lung cancer Brother     hx of smoking  . Diabetes Mother   . Hypertension Mother   . Heart attack Mother   . Stroke Brother   . Heart attack Brother     PTCA MI x 2   Allergies  Allergen Reactions  . Atorvastatin     Leg swelling  . Azithromycin Diarrhea and Nausea And Vomiting  . Ciprofloxacin     tendonitis  . Hydrocodone-Homatropine     REACTION: DROVE HER UP THE WALL  . Rosiglitazone Maleate     REACTION: SWELLING   Current Outpatient Prescriptions on File Prior to Visit  Medication Sig Dispense Refill  . ACCU-CHEK AVIVA PLUS test strip USE 1 STRIP TWICE A DAY TO CHECK BLOOD SUGAR 100 each 2  . acetaminophen (TYLENOL ARTHRITIS PAIN) 650 MG CR tablet Take 1,300 mg by mouth every 8 (eight) hours as needed for pain.    Marland Kitchen aspirin 81 MG tablet Take 81 mg by mouth daily.      . BD PEN NEEDLE NANO U/F 32G X 4 MM MISC USE 1 FOR INJECTION OF INSULIN DAILY 100 each 1  . Blood Glucose Monitoring Suppl (ACCU-CHEK AVIVA PLUS) W/DEVICE KIT Use to check blood sugar twice daily     . clobetasol ointment (TEMOVATE) 0.05 % Apply nightly to irritated area x 2 weeks. 30 g 0  . cyanocobalamin (,VITAMIN B-12,) 1000 MCG/ML injection INJECT 1 ML ONCE A MONTH 1 mL 1  . diphenhydramine-acetaminophen (TYLENOL PM) 25-500 MG TABS Take 1/2 tablet by mouth at bedtime as needed     . fluticasone (FLONASE) 50 MCG/ACT nasal spray Place 2 sprays into both nostrils daily. 16 g 6  . glucose blood (ACCU-CHEK AVIVA PLUS) test strip Use as directed to check BS twice daily E11.9 100 each 0  . guaiFENesin (MUCINEX) 600 MG 12 hr tablet as needed.      . Lancets (ACCU-CHEK MULTICLIX) lancets Use as instructed     . LASTACAFT 0.25 % SOLN     . meloxicam (MOBIC) 7.5 MG tablet     . metFORMIN (GLUCOPHAGE) 1000 MG tablet Take 1,000 mg by mouth daily with lunch.     . Misc Natural  Products (TART CHERRY ADVANCED PO) Take by mouth.    . Multiple Vitamins-Minerals (CENTRUM SILVER PO) Take 1 tablet by mouth daily.      . naproxen sodium (ANAPROX) 220 MG tablet  Take 220 mg by mouth 2 (two) times daily with a meal.    . omeprazole (PRILOSEC) 20 MG capsule TAKE ONE (1) CAPSULE EACH DAY 30 capsule 11  . Phenyleph-Doxylamine-DM-APAP (ALKA SELTZER PLUS PO) Take by mouth as needed.    Marland Kitchen terconazole (TERAZOL 7) 0.4 % vaginal cream Place 1 applicator vaginally at bedtime. 45 g 0  . valsartan-hydrochlorothiazide (DIOVAN-HCT) 160-25 MG tablet TAKE ONE (1) TABLET EACH DAY 90 tablet 1  . vitamin C (ASCORBIC ACID) 500 MG tablet Take 500 mg by mouth 2 (two) times daily.      Marland Kitchen ZETIA 10 MG tablet TAKE ONE (1) TABLET EACH DAY 30 tablet 3   No current facility-administered medications on file prior to visit.     The PMH, PSH, Social History, Family History, Medications, and allergies have been reviewed in [facility], and have been updated if relevant.   Review of Systems  Constitutional: Positive for fatigue. Negative for fever.  HENT: Negative.   Eyes: Negative.   Respiratory: Negative.   Gastrointestinal: Positive for constipation. Negative for nausea, abdominal pain, diarrhea, blood in stool, abdominal distention, anal bleeding and rectal pain.  Endocrine: Positive for cold intolerance.  Genitourinary: Negative.   Musculoskeletal: Negative.   Neurological: Positive for weakness. Negative for tremors, syncope and speech difficulty.  Psychiatric/Behavioral: Negative.   All other systems reviewed and are negative.    Physical Exam BP 134/62 mmHg  Pulse 91  Temp(Src) 97.8 F (36.6 C) (Oral)  Ht 5' 5.25" (1.657 m)  Wt 179 lb 8 oz (81.421 kg)  BMI 29.65 kg/m2  SpO2 99% Wt Readings from Last 3 Encounters:  10/21/15 179 lb 8 oz (81.421 kg)  04/28/15 181 lb 4 oz (82.214 kg)  04/08/15 183 lb 12.8 oz (83.371 kg)     General:  Well-developed,well-nourished,in no acute distress;  alert,appropriate and cooperative throughout examination, mildly overweight. Head:  Normocephalic and atraumatic without obvious abnormalities. Wears wig due to past chemo. Eyes:  Conjunctiva clear bilaterally.  Ears:  External ear exam shows no significant lesions or deformities.  Otoscopic examination reveals clear canals, tympanic membranes are intact bilaterally without bulging, retraction, inflammation or discharge. Hearing is grossly normal bilaterally. Nose:  External nasal examination shows no deformity or inflammation. Nasal mucosa are pink and moist without lesions or exudates. Mouth:  Oral mucosa and oropharynx without lesions or exudates.  Teeth in good repair. Neck:  No deformities, masses, or tenderness noted. Lungs:  Normal respiratory effort, chest expands symmetrically. Lungs are clear to auscultation, no crackles or wheezes. Heart:  Normal rate and regular rhythm. S1 and S2 normal without gallop, murmur, click, rub or other extra sounds. Abdomen:  Bowel sounds positive,abdomen soft and non-tender without masses, organomegaly or hernias noted. Neurologic:  No cranial nerve deficits noted. Station and gait are normal. Plantar reflexes are down-going bilaterally. DTRs are symmetrical throughout. Sensory, motor and coordinative functions appear intact. Skin:  No abnormal lesions

## 2015-10-21 NOTE — Assessment & Plan Note (Signed)
Likely multifactorial.  Sleeping better. Will check labs today as initial work up. Orders Placed This Encounter  Procedures  . Flu Vaccine QUAD 36+ mos PF IM (Fluarix & Fluzone Quad PF)  . Hemoglobin A1c  . Comprehensive metabolic panel  . Lipid panel  . CBC with Differential/Platelet  . TSH  . Vitamin B12  . Vitamin D, 25-hydroxy

## 2015-10-21 NOTE — Progress Notes (Signed)
Pre visit review using our clinic review tool, if applicable. No additional management support is needed unless otherwise documented below in the visit note. 

## 2015-10-21 NOTE — Addendum Note (Signed)
Addended by: Lucille Passy on: 10/21/2015 10:26 AM   Modules accepted: Orders, SmartSet

## 2015-10-21 NOTE — Assessment & Plan Note (Signed)
Well controlled per pt. Check labs today. Foot exam today.

## 2015-10-21 NOTE — Patient Instructions (Signed)
Good to see you. I will call you with your lab results.   

## 2015-10-21 NOTE — Assessment & Plan Note (Signed)
The patients weight, height, BMI and visual acuity have been recorded in the chart.  Cognitive function assessed.   I have made referrals, counseling and provided education to the patient based review of the above and I have provided the pt with a written personalized care plan for preventive services.  

## 2015-10-21 NOTE — Assessment & Plan Note (Signed)
Continue zetia. Recheck labs today.

## 2015-10-24 ENCOUNTER — Telehealth: Payer: Self-pay | Admitting: Family Medicine

## 2015-10-24 NOTE — Telephone Encounter (Signed)
Referral was faxed on 10/21/15, we are waiting for Appt from [facility], spoke with patient and she is aware we will call her when appt info is faxed back to [place].

## 2015-10-24 NOTE — Telephone Encounter (Signed)
Pt called checking on her gi referral Please advise

## 2015-10-27 ENCOUNTER — Encounter: Payer: Self-pay | Admitting: Family Medicine

## 2015-10-30 ENCOUNTER — Telehealth: Payer: Self-pay | Admitting: Family Medicine

## 2015-10-30 NOTE — Telephone Encounter (Signed)
Spoke with pt at length, she is not upset with her PCP, she just expressed frustration with the Medicare Wellness visit.  Recommended that she call her [facility] state plan to see if her plan allows for both a Medicare Wellness and a CPE, (w/ an EKG and breast exam) in the same calendar year.  She wanted to let Dr. Deborra Medina know that she is comforting and very kind, pt is not happy with the "way medicine is done today".  Patient feels very tired, but declined an office visit today or tomorrow or a triage nurse call.  Called Dr. Vira Agar at [facility] to see if she could get an earlier appointment for her uncomfortable hemorrhoids.  She is adamant with only seeing Dr. Despina Hidden, she is scheduled 12/02/2015 3:15 pm.  She is on a wait list with their office.  Pt will call us after thanksgiving holidays if she would like to schedule an appointment.

## 2015-10-30 NOTE — Telephone Encounter (Signed)
Thank you for the update and for taking such great care of our patients, Vaughan Basta.

## 2015-11-11 ENCOUNTER — Other Ambulatory Visit: Payer: Self-pay | Admitting: Family Medicine

## 2015-11-13 ENCOUNTER — Other Ambulatory Visit: Payer: Self-pay | Admitting: Family Medicine

## 2015-11-13 NOTE — Telephone Encounter (Signed)
Yes, sent electronically

## 2015-11-13 NOTE — Telephone Encounter (Signed)
Last B12 normal. Should pt continue injections?

## 2015-11-21 ENCOUNTER — Other Ambulatory Visit: Payer: Self-pay | Admitting: Family Medicine

## 2016-01-15 ENCOUNTER — Other Ambulatory Visit: Payer: Self-pay | Admitting: Family Medicine

## 2016-03-08 ENCOUNTER — Other Ambulatory Visit: Payer: Self-pay | Admitting: Family Medicine

## 2016-03-15 ENCOUNTER — Other Ambulatory Visit: Payer: Self-pay | Admitting: Family Medicine

## 2016-03-15 ENCOUNTER — Other Ambulatory Visit: Payer: Self-pay | Admitting: Internal Medicine

## 2016-03-16 ENCOUNTER — Other Ambulatory Visit: Payer: Self-pay

## 2016-03-16 MED ORDER — METFORMIN HCL 1000 MG PO TABS: 1000.0000 mg | ORAL_TABLET | Freq: Every day | ORAL | Status: DC

## 2016-03-16 NOTE — Telephone Encounter (Signed)
Pt request 90 day refill metformin to [facility]; pt takes one tab daily.last annual 10/21/15 and was noted in that visit pt no longer seen Dr Cruzita Lederer. Refill done per protocol and pt will ck with pharmacy.

## 2016-03-22 ENCOUNTER — Other Ambulatory Visit: Payer: Self-pay | Admitting: Family Medicine

## 2016-03-22 DIAGNOSIS — E785 Hyperlipidemia, unspecified: Secondary | ICD-10-CM

## 2016-03-22 DIAGNOSIS — E119 Type 2 diabetes mellitus without complications: Secondary | ICD-10-CM

## 2016-03-23 ENCOUNTER — Other Ambulatory Visit (INDEPENDENT_AMBULATORY_CARE_PROVIDER_SITE_OTHER): Payer: Medicare Other

## 2016-03-23 DIAGNOSIS — E785 Hyperlipidemia, unspecified: Secondary | ICD-10-CM

## 2016-03-23 DIAGNOSIS — E119 Type 2 diabetes mellitus without complications: Secondary | ICD-10-CM

## 2016-03-23 LAB — LIPID PANEL
CHOLESTEROL: 187 mg/dL (ref 0–200)
HDL: 49.4 mg/dL (ref >39.00)
LDL Cholesterol: 113 mg/dL — ABNORMAL HIGH (ref 0–99)
NonHDL: 137.24
TRIGLYCERIDES: 120 mg/dL (ref 0.0–149.0)
Total CHOL/HDL Ratio: 4
VLDL: 24 mg/dL (ref 0.0–40.0)

## 2016-03-23 LAB — HEMOGLOBIN A1C: HEMOGLOBIN A1C: 7 % — AB (ref 4.6–6.5)

## 2016-03-24 ENCOUNTER — Encounter: Payer: Self-pay | Admitting: Family Medicine

## 2016-03-24 ENCOUNTER — Ambulatory Visit (INDEPENDENT_AMBULATORY_CARE_PROVIDER_SITE_OTHER): Payer: Medicare Other | Admitting: Family Medicine

## 2016-03-24 VITALS — BP 126/66 | HR 97 | Temp 98.5°F | Ht 65.25 in | Wt 187.0 lb

## 2016-03-24 DIAGNOSIS — I10 Essential (primary) hypertension: Secondary | ICD-10-CM | POA: Diagnosis not present

## 2016-03-24 DIAGNOSIS — C50919 Malignant neoplasm of unspecified site of unspecified female breast: Secondary | ICD-10-CM

## 2016-03-24 DIAGNOSIS — Z0000 Encounter for general adult medical examination without abnormal findings: Secondary | ICD-10-CM

## 2016-03-24 DIAGNOSIS — K648 Other hemorrhoids: Secondary | ICD-10-CM

## 2016-03-24 DIAGNOSIS — E785 Hyperlipidemia, unspecified: Secondary | ICD-10-CM

## 2016-03-24 DIAGNOSIS — K644 Residual hemorrhoidal skin tags: Secondary | ICD-10-CM | POA: Insufficient documentation

## 2016-03-24 DIAGNOSIS — E039 Hypothyroidism, unspecified: Secondary | ICD-10-CM | POA: Diagnosis not present

## 2016-03-24 DIAGNOSIS — R5383 Other fatigue: Secondary | ICD-10-CM | POA: Diagnosis not present

## 2016-03-24 DIAGNOSIS — E119 Type 2 diabetes mellitus without complications: Secondary | ICD-10-CM

## 2016-03-24 DIAGNOSIS — Z01419 Encounter for gynecological examination (general) (routine) without abnormal findings: Secondary | ICD-10-CM

## 2016-03-24 LAB — COMPREHENSIVE METABOLIC PANEL
ALT: 14 U/L (ref 0–35)
AST: 22 U/L (ref 0–37)
Albumin: 4.1 g/dL (ref 3.5–5.2)
Alkaline Phosphatase: 46 U/L (ref 39–117)
BUN: 16 mg/dL (ref 6–23)
CALCIUM: 9.9 mg/dL (ref 8.4–10.5)
CHLORIDE: 94 meq/L — AB (ref 96–112)
CO2: 30 meq/L (ref 19–32)
Creatinine, Ser: 0.78 mg/dL (ref 0.40–1.20)
GFR: 76.15 mL/min (ref >60.00)
Glucose, Bld: 129 mg/dL — ABNORMAL HIGH (ref 70–99)
Potassium: 4.2 mEq/L (ref 3.5–5.1)
Sodium: 132 mEq/L — ABNORMAL LOW (ref 135–145)
Total Bilirubin: 0.5 mg/dL (ref 0.2–1.2)
Total Protein: 7.2 g/dL (ref 6.0–8.3)

## 2016-03-24 LAB — CBC WITH DIFFERENTIAL/PLATELET
Basophils Absolute: 0 10*3/uL (ref 0.0–0.1)
Basophils Relative: 0.5 % (ref 0.0–3.0)
EOS PCT: 1.4 % (ref 0.0–5.0)
Eosinophils Absolute: 0.1 10*3/uL (ref 0.0–0.7)
HCT: 38 % (ref 36.0–46.0)
HEMOGLOBIN: 12.9 g/dL (ref 12.0–15.0)
Lymphocytes Relative: 19 % (ref 12.0–46.0)
Lymphs Abs: 1.7 10*3/uL (ref 0.7–4.0)
MCHC: 33.9 g/dL (ref 30.0–36.0)
MCV: 88.1 fl (ref 78.0–100.0)
MONO ABS: 0.9 10*3/uL (ref 0.1–1.0)
MONOS PCT: 9.9 % (ref 3.0–12.0)
NEUTROS PCT: 69.2 % (ref 43.0–77.0)
Neutro Abs: 6.1 10*3/uL (ref 1.4–7.7)
PLATELETS: 413 10*3/uL — AB (ref 150.0–400.0)
RBC: 4.31 Mil/uL (ref 3.87–5.11)
RDW: 13.6 % (ref 11.5–15.5)
WBC: 8.8 10*3/uL (ref 4.0–10.5)

## 2016-03-24 LAB — VITAMIN D 25 HYDROXY (VIT D DEFICIENCY, FRACTURES): VITD: 88.06 ng/mL (ref 30.00–100.00)

## 2016-03-24 LAB — TSH: TSH: 3.19 u[IU]/mL (ref 0.35–4.50)

## 2016-03-24 LAB — VITAMIN B12: Vitamin B-12: 600 pg/mL (ref 211–911)

## 2016-03-24 LAB — T3, FREE: T3 FREE: 2.7 pg/mL (ref 2.3–4.2)

## 2016-03-24 LAB — T4, FREE: Free T4: 0.94 ng/dL (ref 0.60–1.60)

## 2016-03-24 NOTE — Assessment & Plan Note (Signed)
Well controlled.  No changes made. 

## 2016-03-24 NOTE — Assessment & Plan Note (Signed)
Increasingly symptomatic. Refer to gen surgery to discuss thyroidectomy.

## 2016-03-24 NOTE — Progress Notes (Signed)
75 yo female with h/o breast CA, DM here for CPX and follow up of chronic medical conditions and with multiple complaints today  I have personally reviewed the Medicare Annual Wellness questionnaire and have noted 1. The patient's medical and social history 2. Their use of alcohol, tobacco or illicit drugs 3. Their current medications and supplements 4. The patient's functional ability including ADL's, fall risks, home safety risks and hearing or visual             impairment. 5. Diet and physical activities 6. Evidence for depression or mood disorders    Remote h/o hysterectomy Prevnar 13 03/13/14 Pneumovax 12/28/11 Td 08/01/08 Zoster 07/04/12 Colonoscopy 04/22/10- Dr. Vira Agar, 10 year recall. Last eye exam was 03/2015 - Dr. Gloriann Loan Mammogram 12/2015   Wt Readings from Last 3 Encounters:  03/24/16 187 lb (84.823 kg)  10/21/15 179 lb 8 oz (81.421 kg)  04/28/15 181 lb 4 oz (82.214 kg)   Hemorrhoids- saw GI, Dr. Tiffany Kocher last fall.  Per pt, he discussed referring her to a surgeon for hemorrhoidectomy. Takes stool softener nightly.  Still painful and has intermittent bleeding.  Fatigue- chronic issue.  Previously took B12.  Feels it is getting worse.  Hypothyroidism- does feel like she is colder than she typically is.  Lab Results  Component Value Date   TSH 3.82 10/21/2015    DM- No longer seeing Dr. Cruzita Lederer    Her sugars have been stable after addition of Victoza and cutting portions and losing weight, and sugars improved more after increasing Victoza to 1.8.   Checks FSBS- averaging between 88-97.   Last saw her in 03/2014- note reviewed. Wt Readings from Last 3 Encounters:  03/24/16 187 lb (84.823 kg)  10/21/15 179 lb 8 oz (81.421 kg)  04/28/15 181 lb 4 oz (82.214 kg)    Lab Results  Component Value Date   HGBA1C 7.0* 03/23/2016   HLD- taking Zetia 10 mg daily. Lab Results  Component Value Date   CHOL 187 03/23/2016   HDL 49.40 03/23/2016   LDLCALC 113* 03/23/2016    TRIG 120.0 03/23/2016   CHOLHDL 4 03/23/2016    BP- on Diovan- HCT. Well controlled today.   Denies any HA, burred vision or LE edema. Lab Results  Component Value Date   CREATININE 0.79 10/21/2015    H/o breast cancer- 15 years out.  Seeing Dr. Tristan Schroeder.   Mammogram UTD.  Lab Results  Component Value Date   WBC 8.9 10/21/2015   HGB 12.6 10/21/2015   HCT 38.2 10/21/2015   MCV 88.3 10/21/2015   PLT 376.0 10/21/2015    Patient Active Problem List   Diagnosis Date Noted  . Well woman exam 03/24/2016  . Fatigue 10/21/2015  . Poor appetite 10/21/2015  . HLD (hyperlipidemia) 04/28/2015  . Osteopenia 04/28/2015  . 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 HIATAL HERNIA 02/10/2010  . ALLERGY, ENVIRONMENTAL 04/29/2009  . Malignant neoplasm of female breast ([place]) 04/26/2007  . Diabetes ([place]) 04/26/2007  . Essential hypertension 04/26/2007   Past Medical History  Diagnosis Date  . Diabetes mellitus   . Hypertension   . Thyroid disease   . Osteoporosis    Past Surgical History  Procedure Laterality Date  . Mumps  1965    h/o  . Miscarriage  1970  . Septoplasty  1970  . Appendectomy    . Vaginal hysterectomy  1974    partial, fibroids  . Tonsillectomy  1945  . Foot surgery  1992 &  1994    bilateral  . Back surgery  1983    discectomy  . Ventral hernia repair    . 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 Colonoscopy    . Vaginal delivery      x5  . Other surgical history  04/03/02    dexa, wnl  . Other surgical history  03/12/04    dexa, wnl  . Doppler echocardiography  02/24/06    Triv MR, EF 55-60%  . Other surgical history  08/12/05    dexa-osteopor Hip, osteopen spine  . Cartoid u/s  9/83/38    25-05% LICA stenosis  . Other surgical history  10/25/07    dexa- nml hip and spine   Social History  Substance Use Topics  . Smoking status: Never Smoker   . Smokeless tobacco: Never Used  . Alcohol Use: No   Family History   Problem Relation Age of Onset  . Heart failure Father   . Hypertension Father   . Stroke Father   . Lung cancer Brother     hx of smoking  . Diabetes Mother   . Hypertension Mother   . Heart attack Mother   . Stroke Brother   . Heart attack Brother     PTCA MI x 2   Allergies  Allergen Reactions  . Atorvastatin     Leg swelling  . Azithromycin Diarrhea and Nausea And Vomiting  . Ciprofloxacin     tendonitis  . Hydrocodone-Homatropine     REACTION: DROVE HER UP THE WALL  . Rosiglitazone Maleate     REACTION: SWELLING   Current Outpatient Prescriptions on File Prior to Visit  Medication Sig Dispense Refill  . ACCU-CHEK AVIVA PLUS test strip USE 1 STRIP TWICE A DAY TO CHECK BLOOD SUGAR 100 each 2  . acetaminophen (TYLENOL ARTHRITIS PAIN) 650 MG CR tablet Take 1,300 mg by mouth every 8 (eight) hours as needed for pain.    Marland Kitchen aspirin 81 MG tablet Take 81 mg by mouth daily.      . BD PEN NEEDLE NANO U/F 32G X 4 MM MISC USE 1 PEN NEEDLE DAILY 100 each 1  . Blood Glucose Monitoring Suppl (ACCU-CHEK AVIVA PLUS) W/DEVICE KIT Use to check blood sugar twice daily     . clobetasol ointment (TEMOVATE) 0.05 % Apply nightly to irritated area x 2 weeks. 30 g 0  . diphenhydramine-acetaminophen (TYLENOL PM) 25-500 MG TABS Take 1/2 tablet by mouth at bedtime as needed     . ezetimibe (ZETIA) 10 MG tablet TAKE ONE (1) TABLET EACH DAY 30 tablet 1  . fluticasone (FLONASE) 50 MCG/ACT nasal spray Place 2 sprays into both nostrils daily. 16 g 6  . glucose blood (ACCU-CHEK AVIVA PLUS) test strip Use as directed to check BS twice daily E11.9 100 each 0  . guaiFENesin (MUCINEX) 600 MG 12 hr tablet as needed.      . Lancets (ACCU-CHEK MULTICLIX) lancets AS DIRECTED 102 each 1  . LANTUS SOLOSTAR 100 UNIT/ML Solostar Pen INJECT 26 UNITS INTO THE SKIN AT BEDTIME 6 mL 3  . LASTACAFT 0.25 % SOLN     . Liraglutide (VICTOZA) 18 MG/3ML SOPN INJECT 1.8 MG INTO THE SKIN DAILY. 9 mL 5  . meloxicam (MOBIC) 7.5 MG  tablet     . metFORMIN (GLUCOPHAGE) 1000 MG tablet Take 1 tablet (1,000 mg total) by mouth daily with lunch. 90 tablet 2  . Misc Natural  Products (TART CHERRY ADVANCED PO) Take by mouth.    . Multiple Vitamins-Minerals (CENTRUM SILVER PO) Take 1 tablet by mouth daily.      . naproxen sodium (ANAPROX) 220 MG tablet Take 220 mg by mouth 2 (two) times daily with a meal.    . omeprazole (PRILOSEC) 20 MG capsule TAKE ONE (1) CAPSULE EACH DAY 30 capsule 11  . Phenyleph-Doxylamine-DM-APAP (ALKA SELTZER PLUS PO) Take by mouth as needed.    Marland Kitchen terconazole (TERAZOL 7) 0.4 % vaginal cream Place 1 applicator vaginally at bedtime. 45 g 0  . valsartan-hydrochlorothiazide (DIOVAN-HCT) 160-25 MG tablet TAKE ONE (1) TABLET EACH DAY 90 tablet 1  . vitamin C (ASCORBIC ACID) 500 MG tablet Take 500 mg by mouth 2 (two) times daily.      . cyanocobalamin (,VITAMIN B-12,) 1000 MCG/ML injection INJECT 1 ML ONCE A MONTH (Patient not taking: Reported on 03/24/2016) 1 mL 5   No current facility-administered medications on file prior to visit.     The PMH, PSH, Social History, Family History, Medications, and allergies have been reviewed in [facility], and have been updated if relevant.   Review of Systems  Constitutional: Positive for fatigue. Negative for fever.  HENT: Negative.   Eyes: Negative.   Respiratory: Negative.   Gastrointestinal: Positive for constipation. Negative for nausea, abdominal pain, diarrhea, blood in stool, abdominal distention, anal bleeding and rectal pain.       +hemorrhoids  Endocrine: Positive for cold intolerance.  Genitourinary: Negative.   Musculoskeletal: Positive for myalgias and arthralgias.  Neurological: Positive for weakness. Negative for tremors, syncope and speech difficulty.  Psychiatric/Behavioral: Negative.   All other systems reviewed and are negative.    Physical Exam BP 126/66 mmHg  Pulse 97  Temp(Src) 98.5 F (36.9 C) (Oral)  Ht 5' 5.25" (1.657 m)  Wt 187 lb (84.823  kg)  BMI 30.89 kg/m2  SpO2 99% Wt Readings from Last 3 Encounters:  03/24/16 187 lb (84.823 kg)  10/21/15 179 lb 8 oz (81.421 kg)  04/28/15 181 lb 4 oz (82.214 kg)     General:  Well-developed,well-nourished,in no acute distress; alert,appropriate and cooperative throughout examination, mildly overweight. Head:  Normocephalic and atraumatic without obvious abnormalities. Wears wig due to past chemo. Eyes:  Conjunctiva clear bilaterally.  Ears:  External ear exam shows no significant lesions or deformities.  Otoscopic examination reveals clear canals, tympanic membranes are intact bilaterally without bulging, retraction, inflammation or discharge. Hearing is grossly normal bilaterally. Nose:  External nasal examination shows no deformity or inflammation. Nasal mucosa are pink and moist without lesions or exudates. Mouth:  Oral mucosa and oropharynx without lesions or exudates.  Teeth in good repair. Neck:  No deformities, masses, or tenderness noted. Lungs:  Normal respiratory effort, chest expands symmetrically. Lungs are clear to auscultation, no crackles or wheezes. Heart:  Normal rate and regular rhythm. S1 and S2 normal without gallop, murmur, click, rub or other extra sounds. Abdomen:  Bowel sounds positive,abdomen soft and non-tender without masses, organomegaly or hernias noted. Neurologic:  No cranial nerve deficits noted. Station and gait are normal. Plantar reflexes are down-going bilaterally. DTRs are symmetrical throughout. Sensory, motor and coordinative functions appear intact. Skin:  No abnormal lesions GU: + large external hemorrhoid Bladder prolpase

## 2016-03-24 NOTE — Assessment & Plan Note (Signed)
Reviewed preventive care protocols, scheduled due services, and updated immunizations Discussed nutrition, exercise, diet, and healthy lifestyle.  

## 2016-03-24 NOTE — Progress Notes (Signed)
Pre visit review using our clinic review tool, if applicable. No additional management support is needed unless otherwise documented below in the visit note. 

## 2016-03-24 NOTE — Assessment & Plan Note (Signed)
Stable.  No changes made today. 

## 2016-03-24 NOTE — Patient Instructions (Signed)
Good to see you. We are referring you to a surgeon.  We will call you with your lab results.

## 2016-03-24 NOTE — Assessment & Plan Note (Signed)
Mammogram UTD. Followed by Dr. Oliva Bustard

## 2016-03-24 NOTE — Assessment & Plan Note (Signed)
Labs today. Orders Placed This Encounter  Procedures  . Vitamin B12  . TSH  . CBC with Differential/Platelet  . Vitamin D, 25-hydroxy  . Comprehensive metabolic panel  . T4, Free  . T3, Free  . Ambulatory referral to General Surgery

## 2016-04-13 ENCOUNTER — Ambulatory Visit: Payer: Self-pay | Admitting: General Surgery

## 2016-04-20 ENCOUNTER — Other Ambulatory Visit: Payer: Self-pay | Admitting: Family Medicine

## 2016-05-11 ENCOUNTER — Other Ambulatory Visit: Payer: Self-pay | Admitting: Family Medicine

## 2016-05-25 ENCOUNTER — Other Ambulatory Visit: Payer: Self-pay | Admitting: Family Medicine

## 2016-06-24 ENCOUNTER — Other Ambulatory Visit: Payer: Self-pay | Admitting: Family Medicine

## 2016-06-28 ENCOUNTER — Other Ambulatory Visit: Payer: Self-pay | Admitting: *Deleted

## 2016-06-28 MED ORDER — ACCU-CHEK MULTICLIX LANCETS MISC: Status: AC

## 2016-06-30 ENCOUNTER — Ambulatory Visit (INDEPENDENT_AMBULATORY_CARE_PROVIDER_SITE_OTHER): Payer: Medicare Other | Admitting: *Deleted

## 2016-06-30 DIAGNOSIS — Z23 Encounter for immunization: Secondary | ICD-10-CM | POA: Diagnosis not present

## 2016-07-05 ENCOUNTER — Other Ambulatory Visit: Payer: Self-pay | Admitting: Family Medicine

## 2016-07-15 ENCOUNTER — Telehealth: Payer: Self-pay | Admitting: Family Medicine

## 2016-07-15 NOTE — Telephone Encounter (Signed)
[facility] Patient Name: SADIRA YANO DOB: 03/09/1941 Initial Comment caller states she was bitten by a mosquito on the hand and her whole arm is itching Nurse Assessment Nurse: Dimas Chyle, RN, Dellis Filbert Date/Time Eilene Ghazi Time): 07/15/2016 11:25:12 AM Confirm and document reason for call. If symptomatic, describe symptoms. You must click the next button to save text entered. ---Caller states she was bitten by a mosquito on the right hand and her whole arm is itching. Has history of lymphedema. Symptoms started 2 days ago. Has the patient traveled out of the country within the last 30 days? ---No Does the patient have any new or worsening symptoms? ---Yes Will a triage be completed? ---Yes Related visit to physician within the last 2 weeks? ---N/A Does the PT have any chronic conditions? (i.e. diabetes, asthma, etc.) ---Yes List chronic conditions. ---Hx of breast cancer, diabetes type 2, lymphedema Is this a behavioral health or substance abuse call? ---No Guidelines Guideline Title Affirmed Question Affirmed Notes Mosquito Bite Mosquito bite(s) (all triage questions negative) Final Disposition User Widener, RN, Dellis Filbert Disagree/Comply: Comply

## 2016-07-15 NOTE — Telephone Encounter (Signed)
PLEASE NOTE: All timestamps contained within this report are represented as [place] Standard Time. CONFIDENTIALTY NOTICE: This fax transmission is intended only for the addressee. It contains information that is legally privileged, confidential or otherwise protected from use or disclosure. If you are not the intended recipient, you are strictly prohibited from reviewing, disclosing, copying using or disseminating any of this information or taking any action in reliance on or regarding this information. If you have received this fax in error, please notify us immediately by telephone so that we can arrange for its return to [place]. Phone: [phone], Toll-Free: [phone], Fax: [phone] Page: 1 of 2 Call Id: [id number] [place] Patient Name: [name] Gender: Female DOB: 02-Mar-1941 Age: 75 Y 76 M 30 D Return Phone Number: [phone] (Primary), [phone] (Secondary) Address: City/State/Zip: [place] Day - Client Client Site [facility] - Day Physician Arnette Norris - MD Contact Type Call Who Is Calling Patient / Member / Family / Caregiver Call Type Triage / Clinical Relationship To Patient Self Return Phone Number [phone] (Primary) Chief Complaint Insect Bite Reason for Call Symptomatic / Request for Health Information Initial Comment caller states she was bitten by a mosquito on the hand and her whole arm is itching Appointment Disposition EMR Appointment Not Necessary Info pasted into Epic Yes PreDisposition Did not know what to do Translation No Nurse Assessment Nurse: Dimas Chyle, RN, Dellis Filbert Date/Time (Eastern Time): 07/15/2016 11:25:12 AM Confirm and document reason for call. If symptomatic, describe symptoms. You must click the next button to save text entered. ---Caller states she was bitten by a mosquito on the right  hand and her whole arm is itching. Has history of lymphedema. Symptoms started 2 days ago. Has the patient traveled out of the country within the last 30 days? ---No Does the patient have any new or worsening symptoms? ---Yes Will a triage be completed? ---Yes Related visit to physician within the last 2 weeks? ---N/A Does the PT have any chronic conditions? (i.e. diabetes, asthma, etc.) ---Yes List chronic conditions. ---Hx of breast cancer, diabetes type 2, lymphedema Is this a behavioral health or substance abuse call? ---No Guidelines Guideline Title Affirmed Question Affirmed Notes Nurse Date/Time Eilene Ghazi Time) Mosquito Bite Mosquito bite(s) (all triage questions negative) Dimas Chyle, RN, Dellis Filbert 07/15/2016 11:29:41 AM Disp. Time Eilene Ghazi Time) Disposition Final User 07/15/2016 11:06:56 AM Attempt made - no message left Terrence Dupont 07/15/2016 11:34:03 AM Home Care Yes Dimas Chyle, RN, Dellis Filbert PLEASE NOTE: All timestamps contained within this report are represented as [place] Standard Time. CONFIDENTIALTY NOTICE: This fax transmission is intended only for the addressee. It contains information that is legally privileged, confidential or otherwise protected from use or disclosure. If you are not the intended recipient, you are strictly prohibited from reviewing, disclosing, copying using or disseminating any of this information or taking any action in reliance on or regarding this information. If you have received this fax in error, please notify us immediately by telephone so that we can arrange for its return to [place]. Phone: [phone], Toll-Free: [phone], Fax: [phone] Page: 2 of 2 Call Id: [id number] Caller Understands: Yes Disagree/Comply: Comply Care Advice Given Per Guideline HOME CARE: You should be able to treat this at home. REASSURANCE: * In the [place] and [place], mosquito bites are usually just an annoyance, causing very itchy red skin bumps. * When a  mosquito bites an individual, various  chemicals are injected into the skin. The red bumps are actually the body's allergic reaction to these chemicals. DON'T SCRATCH: * Try not to scratch. * Itching is often worsened by scratching (the 'Itch-Scratch' cycle). * Cut the fingernails short. (Reason: prevent secondary bacterial infection.) CARE ADVICE given per Mosquito Bites (Adult) guideline. * You become worse. * Severe itching persists over 1-2 days on treatment * Looks infected (e.g., spreading redness, red streak, pus) CALL BACK IF: * Any pinkness or redness usually lasts 2-3 days. * Mosquito bites of the upper face can sometimes cause marked swelling around the eye, especially in children, but this is harmless. * Most mosquito bites itch for several days. EXPECTED COURSE: * Read the package instructions and warnings. * Do not take these medicines if you have prostate problems. * May cause sleepiness. Do not drink alcohol, drive or operate dangerous machinery while taking antihistamines. * Examples include diphenhydramine (Benadryl) and chlorpheniramine (Chlortrimeton, Chlor-tripolon) CAUTION - ANTIHISTAMINES: * Other OTC antihistamine: loratadine (Alavert, Claritin). * Take an antihistamine by mouth to reduce the itching. Diphenhydramine (OTC Benadryl) is a good choice. Adult dose is 25-50 mg. Take it up to 4 times a day. ANTIHISTAMINE FOR SEVERE ITCHING: HYDROCORTISONE CREAM FOR ITCHING: * Apply hydrocortisone cream 4 times a day. Use it for a couple days, until the itch is mild. * Available OTC in [place] as 0.5% and 1% cream.

## 2016-08-02 ENCOUNTER — Other Ambulatory Visit: Payer: Self-pay | Admitting: Family Medicine

## 2016-08-21 ENCOUNTER — Other Ambulatory Visit: Payer: Self-pay | Admitting: Family Medicine

## 2016-08-23 NOTE — Telephone Encounter (Signed)
Last labs abnormal. pls advise 

## 2016-09-03 ENCOUNTER — Other Ambulatory Visit: Payer: Self-pay | Admitting: Orthopedic Surgery

## 2016-09-03 DIAGNOSIS — M5416 Radiculopathy, lumbar region: Secondary | ICD-10-CM

## 2016-09-13 ENCOUNTER — Other Ambulatory Visit: Payer: Self-pay | Admitting: Family Medicine

## 2016-09-16 ENCOUNTER — Ambulatory Visit
Admission: RE | Admit: 2016-09-16 | Discharge: 2016-09-16 | Disposition: A | Discharge status: Home / Self Care | Payer: Medicare Other | Source: Ambulatory Visit | Attending: Orthopedic Surgery | Admitting: Orthopedic Surgery

## 2016-09-16 DIAGNOSIS — M7138 Other bursal cyst, other site: Secondary | ICD-10-CM | POA: Insufficient documentation

## 2016-09-16 DIAGNOSIS — M4316 Spondylolisthesis, lumbar region: Secondary | ICD-10-CM | POA: Diagnosis not present

## 2016-09-16 DIAGNOSIS — M48061 Spinal stenosis, lumbar region without neurogenic claudication: Secondary | ICD-10-CM | POA: Diagnosis not present

## 2016-09-16 DIAGNOSIS — M5116 Intervertebral disc disorders with radiculopathy, lumbar region: Secondary | ICD-10-CM | POA: Insufficient documentation

## 2016-09-16 DIAGNOSIS — M1288 Other specific arthropathies, not elsewhere classified, other specified site: Secondary | ICD-10-CM | POA: Insufficient documentation

## 2016-09-16 DIAGNOSIS — M5416 Radiculopathy, lumbar region: Secondary | ICD-10-CM | POA: Diagnosis present

## 2016-09-23 ENCOUNTER — Encounter: Payer: Self-pay | Admitting: Obstetrics and Gynecology

## 2016-09-23 ENCOUNTER — Ambulatory Visit (INDEPENDENT_AMBULATORY_CARE_PROVIDER_SITE_OTHER): Payer: Medicare Other | Admitting: Obstetrics and Gynecology

## 2016-09-23 VITALS — BP 163/84 | HR 102 | Ht 65.25 in | Wt 190.0 lb

## 2016-09-23 DIAGNOSIS — N816 Rectocele: Secondary | ICD-10-CM | POA: Diagnosis not present

## 2016-09-23 DIAGNOSIS — N952 Postmenopausal atrophic vaginitis: Secondary | ICD-10-CM | POA: Diagnosis not present

## 2016-09-23 DIAGNOSIS — N393 Stress incontinence (female) (male): Secondary | ICD-10-CM

## 2016-09-23 DIAGNOSIS — R399 Unspecified symptoms and signs involving the genitourinary system: Secondary | ICD-10-CM

## 2016-09-23 DIAGNOSIS — I10 Essential (primary) hypertension: Secondary | ICD-10-CM | POA: Diagnosis not present

## 2016-09-23 DIAGNOSIS — N993 Prolapse of vaginal vault after hysterectomy: Secondary | ICD-10-CM

## 2016-09-23 DIAGNOSIS — N8110 Cystocele, unspecified: Secondary | ICD-10-CM | POA: Diagnosis not present

## 2016-09-23 LAB — POCT URINALYSIS DIPSTICK
Bilirubin, UA: NEGATIVE
GLUCOSE UA: NEGATIVE
Ketones, UA: NEGATIVE
NITRITE UA: NEGATIVE
PROTEIN UA: NEGATIVE
RBC UA: NEGATIVE
SPEC GRAV UA: 1.015
UROBILINOGEN UA: NEGATIVE
pH, UA: 6

## 2016-09-23 NOTE — Patient Instructions (Signed)

## 2016-09-23 NOTE — Progress Notes (Signed)
GYNECOLOGY PROGRESS NOTE  Subjective:    Patient ID: [name], female    DOB: [birth year], 75 y.o.   MRN: [id number]  HPI  Patient is a 75 y.o. P81 female with remote h/o hysterectomy and breast cancer who presents for complaints of vaginal irritation and notes that her bladder has fallen.  Notes that she has had the bladder symptoms for "a while".   The vaginal irritation has been intermittent over the past several months. Patient denies any abnormal vaginal discharge.  Patient does report some occasional stress urinary incontinence changing from a seated to a standing position.She loses approximately a teaspoon of urine.   Past Medical History:  Diagnosis Date  . Diabetes mellitus   . Hypertension   . Osteoporosis   . Thyroid disease     Family History  Problem Relation Age of Onset  . Diabetes Mother   . Hypertension Mother   . Heart attack Mother   . Heart failure Father   . Hypertension Father   . Stroke Father   . Lung cancer Brother     hx of smoking  . Stroke Brother   . Heart attack Brother     PTCA MI x 2    Past Surgical History:  Procedure Laterality Date  . APPENDECTOMY    . BACK SURGERY  1983   discectomy  . cartoid u/s  9/37/16   96-78% LICA stenosis  . COLONOSCOPY    . DOPPLER ECHOCARDIOGRAPHY  02/24/06   Triv MR, EF 55-60%  . [place]   bilateral  . MASTECTOMY     right, modified radical+ chemo, 2/23 nodes 4/03  . miscarriage  1970  . mumps  1965   h/o  . OTHER SURGICAL HISTORY  04/03/02   dexa, wnl  . OTHER SURGICAL HISTORY  03/12/04   dexa, wnl  . OTHER SURGICAL HISTORY  08/12/05   dexa-osteopor Hip, osteopen spine  . OTHER SURGICAL HISTORY  10/25/07   dexa- nml hip and spine  . SEPTOPLASTY  1970  . TONSILLECTOMY  1945  . VAGINAL DELIVERY     x5  . VAGINAL HYSTERECTOMY  1974   partial, fibroids  . VENTRAL HERNIA REPAIR      Social History   Social History  . Marital status: Married    Spouse name: N/A    . Number of children: 5  . Years of education: N/A   Occupational History  . Retired Pharmacist, hospital Retired   Social History Main Topics  . Smoking status: Never Smoker  . Smokeless tobacco: Never Used  . Alcohol use No  . Drug use: No  . Sexual activity: Not on file   Other Topics Concern  . Not on file   Social History Narrative   Married 50th anniversary (8/08)   Has a Living Will- all of her children have a copy of it.   Desires CPR but would not want prolonged life support.                Current Outpatient Prescriptions on File Prior to Visit  Medication Sig Dispense Refill  . ACCU-CHEK AVIVA PLUS test strip USE 1 STRIP TWICE A DAY TO CHECK BLOOD SUGAR 100 each 1  . acetaminophen (TYLENOL ARTHRITIS PAIN) 650 MG CR tablet Take 1,300 mg by mouth every 8 (eight) hours as needed for pain.    Marland Kitchen aspirin 81 MG tablet Take 81 mg by mouth daily.      Marland Kitchen  BD PEN NEEDLE NANO U/F 32G X 4 MM MISC USE 1 PEN NEEDLE DAILY 100 each 2  . Blood Glucose Monitoring Suppl (ACCU-CHEK AVIVA PLUS) W/DEVICE KIT Use to check blood sugar twice daily     . clobetasol ointment (TEMOVATE) 0.05 % Apply nightly to irritated area x 2 weeks. 30 g 0  . cyanocobalamin (,VITAMIN B-12,) 1000 MCG/ML injection INJECT 1 ML ONCE A MONTH 1 mL 5  . diphenhydramine-acetaminophen (TYLENOL PM) 25-500 MG TABS Take 1/2 tablet by mouth at bedtime as needed     . ezetimibe (ZETIA) 10 MG tablet TAKE ONE (1) TABLET EACH DAY 30 tablet 8  . fluticasone (FLONASE) 50 MCG/ACT nasal spray Place 2 sprays into both nostrils daily. 16 g 6  . guaiFENesin (MUCINEX) 600 MG 12 hr tablet as needed.      . Lancets (ACCU-CHEK MULTICLIX) lancets Use as instructed to check blood sugar twice daily E11.9 102 each 3  . LANTUS SOLOSTAR 100 UNIT/ML Solostar Pen INJECT 26 UNITS INTO THE SKIN AT BEDTIME 6 mL 4  . LASTACAFT 0.25 % SOLN     . meloxicam (MOBIC) 7.5 MG tablet     . metFORMIN (GLUCOPHAGE) 1000 MG tablet Take 1 tablet (1,000 mg total) by  mouth daily with lunch. 90 tablet 2  . Misc Natural Products (TART Gram Siedlecki ADVANCED PO) Take by mouth.    . Multiple Vitamins-Minerals (CENTRUM SILVER PO) Take 1 tablet by mouth daily.      . naproxen sodium (ANAPROX) 220 MG tablet Take 220 mg by mouth 2 (two) times daily with a meal.    . omeprazole (PRILOSEC) 20 MG capsule TAKE ONE (1) CAPSULE EACH DAY 90 capsule 3  . Phenyleph-Doxylamine-DM-APAP (ALKA SELTZER PLUS PO) Take by mouth as needed.    Marland Kitchen terconazole (TERAZOL 7) 0.4 % vaginal cream Place 1 applicator vaginally at bedtime. 45 g 0  . valsartan-hydrochlorothiazide (DIOVAN-HCT) 160-25 MG tablet TAKE ONE (1) TABLET EACH DAY 90 tablet 1  . VICTOZA 18 MG/3ML SOPN INJECT 1.8 MG INTO THE SKIN DAILY. 9 mL 3  . vitamin C (ASCORBIC ACID) 500 MG tablet Take 500 mg by mouth 2 (two) times daily.       No current facility-administered medications on file prior to visit.     Allergies  Allergen Reactions  . Atorvastatin     Other reaction(s): Unknown Leg swelling Leg swelling  . Azithromycin Diarrhea and Nausea And Vomiting    Other reaction(s): Nausea And Vomiting  . Ciprofloxacin     Other reaction(s): Unknown tendonitis tendonitis  . Hydrocodone-Homatropine     REACTION: DROVE HER UP THE WALL  . Hydrocodone-Homatropine     Other reaction(s): Other (See Comments) REACTION: DROVE HER UP THE WALL  . Rosiglitazone     Other reaction(s): Unknown REACTION: SWELLING  . Rosiglitazone Maleate     REACTION: SWELLING    Review of Systems A comprehensive review of systems was negative except for: Musculoskeletal: positive for back pain (patient notes that she has been recommended for surgery in the near future).  Objective:   Blood pressure (!) 163/84, pulse (!) 102, height 5' 5.25" (1.657 m), weight 190 lb (86.2 kg). General appearance: alert and no distress Abdomen: soft, non-tender; bowel sounds normal; no masses,  no organomegaly Pelvic exam:      URETHRA: non-tender, no lesions or  dribbling      VULVA: vulvar nodule on left labia majora, hypopigmented, vesicular, nontender,       VAGINA: atrophic, PELVIC  FLOOR EXAM: rectocele Grade 2-3, cystocele Grade 1-2      CERVIX: surgically absent, UTERUS: surgically absent, vaginal cuff well healed,                     ADNEXA: normal adnexa in size, nontender and no masses. 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 Is the likely cause of her vaginal irritation, as no vaginal discharge was noted in the vaginal vault. Discussed management options for atrophy. As patient with remote history of breast cancer would refrain from use of local estrogen therapy unless all other measures fail. We'll start with use of vaginal moisturizers. 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 Discussion had all management options including use of pessary and surgical management. Patient desires surgical management anterior-posterior repair. We will perform. Patient to return in 2-3 weeks for preoperative evaluation, and tentatively will be scheduled for 10/18/2016.  Given handouts to review at home.  - Hypertension currently uncontrolled on today's exam. Patient notes that BPs in office are normally 130s/70s.  We'll continue to monitor. - Patient with mild stress incontinence, symptoms are not very  bothersome. Informed patient that with surgical intervention her symptoms could improve or worsen.   Rubie Maid, MD [facility]

## 2016-09-24 ENCOUNTER — Encounter: Payer: Self-pay | Admitting: Obstetrics and Gynecology

## 2016-09-24 DIAGNOSIS — N393 Stress incontinence (female) (male): Secondary | ICD-10-CM | POA: Insufficient documentation

## 2016-10-13 ENCOUNTER — Ambulatory Visit (INDEPENDENT_AMBULATORY_CARE_PROVIDER_SITE_OTHER): Payer: Medicare Other | Admitting: Obstetrics and Gynecology

## 2016-10-13 ENCOUNTER — Encounter: Payer: Self-pay | Admitting: Obstetrics and Gynecology

## 2016-10-13 VITALS — BP 178/88 | HR 106 | Ht 65.25 in | Wt 184.6 lb

## 2016-10-13 DIAGNOSIS — N816 Rectocele: Secondary | ICD-10-CM

## 2016-10-13 DIAGNOSIS — Z01818 Encounter for other preprocedural examination: Secondary | ICD-10-CM

## 2016-10-13 DIAGNOSIS — E119 Type 2 diabetes mellitus without complications: Secondary | ICD-10-CM

## 2016-10-13 DIAGNOSIS — I10 Essential (primary) hypertension: Secondary | ICD-10-CM

## 2016-10-13 DIAGNOSIS — N8110 Cystocele, unspecified: Secondary | ICD-10-CM

## 2016-10-13 NOTE — H&P (Addendum)
GYNECOLOGY PRE-OPERATIVE EXAM CLINIC VISIT Subjective:    Patient is a 75 y.o. P99 female scheduled for anterior and posterior vaginal repair. Indications for procedure are vaginal vault prolapse (cystocele with rectocele) and vaginal pressure.   Pertinent Gynecological History: Menses: post-menopausal.  Denies bleeding.  Last mammogram: normal (BIRADS 1) Date: 01/2015 (left breast only, h/o right breast mastectomy for malignancy) Last pap: normal Date: 05/2011  Discussed Blood/Blood Products: no   Menstrual History: OB History  Gravida Para Term Preterm AB Living  '5 5 5     5  ' SAB TAB Ectopic Multiple Live Births          5    # Outcome Date GA Lbr Len/2nd Weight Sex Delivery Anes PTL Lv  5 Term      Vag-Spont     4 Term      Vag-Spont     3 Term      Vag-Spont     2 Term      Vag-Spont     1 Term      Vag-Spont       Obstetric Comments  All birth weights between 9-12 lbs    Past Medical History:  Diagnosis Date  . Diabetes mellitus   . Hypertension   . Osteoporosis   . Thyroid disease     Past Surgical History:  Procedure Laterality Date  . APPENDECTOMY    . BACK SURGERY  1983   discectomy  . cartoid u/s  8/41/32   44-01% LICA stenosis  . COLONOSCOPY    . DOPPLER ECHOCARDIOGRAPHY  02/24/06   Triv MR, EF 55-60%  . [place]   bilateral  . MASTECTOMY     right, modified radical+ chemo, 2/23 nodes 4/03  . miscarriage  1970  . mumps  1965   h/o  . OTHER SURGICAL HISTORY  04/03/02   dexa, wnl  . OTHER SURGICAL HISTORY  03/12/04   dexa, wnl  . OTHER SURGICAL HISTORY  08/12/05   dexa-osteopor Hip, osteopen spine  . OTHER SURGICAL HISTORY  10/25/07   dexa- nml hip and spine  . SEPTOPLASTY  1970  . TONSILLECTOMY  1945  . VAGINAL DELIVERY     x5  . VAGINAL HYSTERECTOMY  1974   partial, fibroids  . VENTRAL HERNIA REPAIR      Family History  Problem Relation Age of Onset  . Diabetes Mother   . Hypertension Mother   . Heart attack  Mother   . Heart failure Father   . Hypertension Father   . Stroke Father   . Lung cancer Brother     hx of smoking  . Stroke Brother   . Heart attack Brother     PTCA MI x 2    Social History   Social History  . Marital status: Married    Spouse name: N/A  . Number of children: 5  . Years of education: N/A   Occupational History  . Retired Pharmacist, hospital Retired   Social History Main Topics  . Smoking status: Never Smoker  . Smokeless tobacco: Never Used  . Alcohol use No  . Drug use: No  . Sexual activity: Not on file   Other Topics Concern  . Not on file   Social History Narrative   Married 50th anniversary (8/08)   Has a Living Will- all of her children have a copy of it.   Desires CPR but would not want prolonged life support.  Current Outpatient Prescriptions on File Prior to Visit  Medication Sig Dispense Refill  . ACCU-CHEK AVIVA PLUS test strip USE 1 STRIP TWICE A DAY TO CHECK BLOOD SUGAR 100 each 1  . acetaminophen (TYLENOL ARTHRITIS PAIN) 650 MG CR tablet Take 1,300 mg by mouth every 8 (eight) hours as needed for pain.    Marland Kitchen aspirin 81 MG tablet Take 81 mg by mouth daily.      . BD PEN NEEDLE NANO U/F 32G X 4 MM MISC USE 1 PEN NEEDLE DAILY 100 each 2  . Blood Glucose Monitoring Suppl (ACCU-CHEK AVIVA PLUS) W/DEVICE KIT Use to check blood sugar twice daily     . diphenhydramine-acetaminophen (TYLENOL PM) 25-500 MG TABS Take 0.5 tablets by mouth at bedtime as needed (sleep).     . ezetimibe (ZETIA) 10 MG tablet TAKE ONE (1) TABLET EACH DAY 30 tablet 8  . fluticasone (FLONASE) 50 MCG/ACT nasal spray Place 2 sprays into both nostrils daily. (Patient taking differently: Place 2 sprays into both nostrils daily as needed for allergies or rhinitis. ) 16 g 6  . Lancets (ACCU-CHEK MULTICLIX) lancets Use as instructed to check blood sugar twice daily E11.9 102 each 3  . LANTUS SOLOSTAR 100 UNIT/ML Solostar Pen INJECT 26 UNITS INTO THE SKIN AT BEDTIME 6 mL  4  . metFORMIN (GLUCOPHAGE) 1000 MG tablet Take 1 tablet (1,000 mg total) by mouth daily with lunch. 90 tablet 2  . Multiple Vitamins-Minerals (CENTRUM SILVER PO) Take 1 tablet by mouth daily.      . naproxen sodium (ANAPROX) 220 MG tablet Take 440 mg by mouth daily.    . Omega-3 Fatty Acids (FISH OIL PO) Take 1 capsule by mouth daily.     Marland Kitchen omeprazole (PRILOSEC) 20 MG capsule TAKE ONE (1) CAPSULE EACH DAY 90 capsule 3  . OVER THE COUNTER MEDICATION Take 1 tablet by mouth daily. Immune System Support Supplement includes :  Echinacea Zinc Vitamin C    . terconazole (TERAZOL 7) 0.4 % vaginal cream Place 1 applicator vaginally at bedtime. 45 g 0  . valsartan-hydrochlorothiazide (DIOVAN-HCT) 160-25 MG tablet TAKE ONE (1) TABLET EACH DAY 90 tablet 1  . VICTOZA 18 MG/3ML SOPN INJECT 1.8 MG INTO THE SKIN DAILY. 9 mL 3  . clobetasol ointment (TEMOVATE) 0.05 % Apply nightly to irritated area x 2 weeks. (Patient not taking: Reported on 10/13/2016) 30 g 0  . cyanocobalamin (,VITAMIN B-12,) 1000 MCG/ML injection INJECT 1 ML ONCE A MONTH (Patient not taking: Reported on 10/13/2016) 1 mL 5   No current facility-administered medications on file prior to visit.      Allergies  Allergen Reactions  . Atorvastatin Swelling    Leg swelling Other reaction(s): Unknown Leg swelling Leg swelling  . Azithromycin Diarrhea and Nausea And Vomiting    Other reaction(s): Nausea And Vomiting  . Ciprofloxacin Other (See Comments)    Does not remember Other reaction(s): Unknown tendonitis tendonitis  . Hydrocodone-Homatropine     REACTION: DROVE HER UP THE WALL  . Hydrocodone-Homatropine     Other reaction(s): Other (See Comments) Drowsiness Other reaction(s): Other (See Comments) REACTION: DROVE HER UP THE WALL  . Rosiglitazone Swelling    Other reaction(s): Unknown REACTION: SWELLING  . Rosiglitazone Maleate     REACTION: SWELLING    Review of Systems Constitutional: No recent  fever/chills/sweats Respiratory: No recent cough/bronchitis Cardiovascular: No chest pain Gastrointestinal: No recent nausea/vomiting/diarrhea Genitourinary: No UTI symptoms Hematologic/lymphatic:No history of coagulopathy or recent blood thinner use  Objective:    BP (!) 178/88 (BP Location: Left Arm, Patient Position: Sitting, Cuff Size: Large)   Pulse (!) 106   Ht 5' 5.25" (1.657 m)   Wt 184 lb 9.6 oz (83.7 kg)   BMI 30.48 kg/m   General:   Normal  Skin:   normal  HEENT:  Normal  Neck:  Supple without Adenopathy or Thyromegaly  Lungs:   Heart:              Breasts:   Abdomen:  Pelvis:         Extremeties:  M/S:   Neuro:    clear to auscultation bilaterally   Normal without murmur   Not Examined   soft, non-tender; bowel sounds normal; no masses,  no organomegaly/ Faint well healed Pfannenstiel incision.    URETHRA: non-tender, no lesions or dribbling  VULVA: vulvar nodule on left labia majora, hypopigmented, vesicular, nontender,   VAGINA: atrophic, PELVIC FLOOR EXAM: rectocele Grade 2-3, cystocele Grade 1-2  CERVIX: surgically absent, UTERUS: surgically absent, vaginal cuff well healed,                      ADNEXA: normal adnexa in size, nontender and no masses . Extremities: extremities normal, atraumatic, no cyanosis or edema Neurologic: Grossly normal  No CVAT  Warm/Dry   Normal            Labs:  Lab Results  Component Value Date   WBC 8.8 03/24/2016   HGB 12.9 03/24/2016   HCT 38.0 03/24/2016   MCV 88.1 03/24/2016   PLT 413.0 (H) 03/24/2016   Lab Results  Component Value Date   CREATININE 0.78 03/24/2016   BUN 16 03/24/2016   NA 132 (L) 03/24/2016   K 4.2 03/24/2016   CL 94 (L) 03/24/2016   CO2 30 03/24/2016   Lab Results  Component Value Date   ALT 14 03/24/2016   AST 22 03/24/2016   ALKPHOS 46 03/24/2016   BILITOT 0.5 03/24/2016    Lab Results  Component Value Date   TSH 3.19 03/24/2016   Lab Results  Component Value  Date   HGBA1C 7.0 (H) 03/23/2016     Assessment:    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  - Preop testing ordered.  Will order CBC, CMP, EKG, HgbA1c.  - Patient with cHTN, elevated during today's exam, patient reports white coat syndrome. Takes BP meds as prescribed, usually BPs run 130s/80s.  - Instructions reviewed, including NPO after midnight.  Discussed medications to continue/discontinue.   - DVT prophylaxis with SCDs. - To proceed with scheduled procedure of Anterior and Posterior repair, scheduled for 10/18/16.      Rubie Maid, MD [facility]

## 2016-10-13 NOTE — Patient Instructions (Signed)
You are scheduled for surgery on 10/18/2016.  Nothing to eat after midnight on day prior to surgery.  Do not take any medications unless recommended by your provider on day prior to surgery.  Do not take NSAIDs (Motrin, Aleve) or aspirin 7 days prior to surgery.  You may take Tylenol products for minor aches and pains.  You will receive a prescription for pain medications post-operatively.  You will be contacted by phone approximately 1 week prior to surgery to schedule pre-operative appointment.  Please call the office if you have any questions regarding your upcoming surgery.

## 2016-10-13 NOTE — Progress Notes (Signed)
    GYNECOLOGY PROGRESS NOTE  Subjective:    Patient ID: ADDELINE KASTLE, female    DOB: 01-Sep-1941, 75 y.o.   MRN: [id number]  HPI  Patient is a 75 y.o.  P79 female who presents for pre-operative appointment.  Patient is scheduled for anterior and posterior colporrhapy for vaginal vault prolapse on 10/18/16.  Denies complaints today.   The following portions of the patient's history were reviewed and updated as appropriate: allergies, current medications, past family history, past medical history, past social history, past surgical history and problem list.  Review of Systems Genitourinary:positive for pelvic pressure and discomfort   Objective:   Blood pressure (!) 178/88, pulse (!) 106, height 5' 5.25" (1.657 m), weight 184 lb 9.6 oz (83.7 kg). General appearance: alert and no distress Abdomen: soft, non-tender; bowel sounds normal; no masses,  no organomegaly Pelvic:      URETHRA: non-tender, no lesions or dribbling      VULVA: vulvar nodule on left labia majora, hypopigmented, vesicular, nontender,       VAGINA: atrophic, PELVIC FLOOR EXAM: rectocele Grade 2-3, cystocele Grade 1-2      CERVIX: surgically absent, UTERUS: surgically absent, vaginal cuff well healed,                         ADNEXA: normal adnexa in size, nontender and no masses. 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  - Preop testing ordered.  Will order CBC, CMP, EKG, HgbA1c. All questions answered.  Discussed procedure time and anticipated post-operative recovery time, including hospital stay and home care.  - Patient with cHTN,  elevated during today's exam, patient reports white coat syndrome. Takes BP meds as prescribed, usually BPs run 130s/80s. Can be managed intraoperatively.  - Instructions reviewed, including NPO after midnight.  Discussed medications to continue/discontinue.   - To proceed with scheduled procedure of Anterior and Posterior repair, scheduled for 10/18/16.   Rubie Maid, MD [facility]

## 2016-10-14 ENCOUNTER — Other Ambulatory Visit: Payer: Medicare Other

## 2016-10-14 ENCOUNTER — Encounter
Admission: RE | Admit: 2016-10-14 | Discharge: 2016-10-14 | Disposition: A | Discharge status: Home / Self Care | Payer: Medicare Other | Source: Ambulatory Visit | Attending: Obstetrics and Gynecology | Admitting: Obstetrics and Gynecology

## 2016-10-14 DIAGNOSIS — N393 Stress incontinence (female) (male): Secondary | ICD-10-CM | POA: Insufficient documentation

## 2016-10-14 DIAGNOSIS — C50919 Malignant neoplasm of unspecified site of unspecified female breast: Secondary | ICD-10-CM | POA: Diagnosis not present

## 2016-10-14 DIAGNOSIS — Z01812 Encounter for preprocedural laboratory examination: Secondary | ICD-10-CM | POA: Insufficient documentation

## 2016-10-14 DIAGNOSIS — Z01810 Encounter for preprocedural cardiovascular examination: Secondary | ICD-10-CM | POA: Diagnosis present

## 2016-10-14 DIAGNOSIS — I10 Essential (primary) hypertension: Secondary | ICD-10-CM | POA: Diagnosis not present

## 2016-10-14 DIAGNOSIS — K644 Residual hemorrhoidal skin tags: Secondary | ICD-10-CM | POA: Diagnosis not present

## 2016-10-14 DIAGNOSIS — K219 Gastro-esophageal reflux disease without esophagitis: Secondary | ICD-10-CM | POA: Diagnosis not present

## 2016-10-14 DIAGNOSIS — R5383 Other fatigue: Secondary | ICD-10-CM | POA: Insufficient documentation

## 2016-10-14 DIAGNOSIS — E1140 Type 2 diabetes mellitus with diabetic neuropathy, unspecified: Secondary | ICD-10-CM | POA: Diagnosis not present

## 2016-10-14 DIAGNOSIS — K449 Diaphragmatic hernia without obstruction or gangrene: Secondary | ICD-10-CM | POA: Diagnosis not present

## 2016-10-14 HISTORY — DX: Unspecified osteoarthritis, unspecified site: M19.90

## 2016-10-14 HISTORY — DX: Polyneuropathy, unspecified: G62.9

## 2016-10-14 HISTORY — DX: Gastro-esophageal reflux disease without esophagitis: K21.9

## 2016-10-14 HISTORY — DX: Lymphedema, not elsewhere classified: I89.0

## 2016-10-14 LAB — COMPREHENSIVE METABOLIC PANEL
ALT: 17 U/L (ref 14–54)
AST: 25 U/L (ref 15–41)
Albumin: 4.1 g/dL (ref 3.5–5.0)
Alkaline Phosphatase: 42 U/L (ref 38–126)
Anion gap: 9 (ref 5–15)
BUN: 19 mg/dL (ref 6–20)
CHLORIDE: 97 mmol/L — AB (ref 101–111)
CO2: 27 mmol/L (ref 22–32)
Calcium: 9.7 mg/dL (ref 8.9–10.3)
Creatinine, Ser: 0.82 mg/dL (ref 0.44–1.00)
Glucose, Bld: 112 mg/dL — ABNORMAL HIGH (ref 65–99)
POTASSIUM: 3.9 mmol/L (ref 3.5–5.1)
Sodium: 133 mmol/L — ABNORMAL LOW (ref 135–145)
Total Bilirubin: <0.1 mg/dL — ABNORMAL LOW (ref 0.3–1.2)
Total Protein: 7.5 g/dL (ref 6.5–8.1)

## 2016-10-14 LAB — URINALYSIS COMPLETE WITH MICROSCOPIC (ARMC ONLY)
Bacteria, UA: NONE SEEN
Bilirubin Urine: NEGATIVE
Glucose, UA: NEGATIVE mg/dL
Ketones, ur: NEGATIVE mg/dL
Leukocytes, UA: NEGATIVE
Nitrite: NEGATIVE
PH: 5 (ref 5.0–8.0)
PROTEIN: NEGATIVE mg/dL
Specific Gravity, Urine: 1.013 (ref 1.005–1.030)
WBC UA: NONE SEEN WBC/hpf (ref 0–5)

## 2016-10-14 LAB — CBC
HCT: 37.5 % (ref 35.0–47.0)
Hemoglobin: 13.3 g/dL (ref 12.0–16.0)
MCH: 31.3 pg (ref 26.0–34.0)
MCHC: 35.5 g/dL (ref 32.0–36.0)
MCV: 88.4 fL (ref 80.0–100.0)
PLATELETS: 358 10*3/uL (ref 150–440)
RBC: 4.24 MIL/uL (ref 3.80–5.20)
RDW: 13.3 % (ref 11.5–14.5)
WBC: 8.8 10*3/uL (ref 3.6–11.0)

## 2016-10-14 NOTE — Patient Instructions (Signed)
  Your procedure is scheduled on: 10/18/16 Mon Report to Same Day Surgery 2nd floor medical mall To find out your arrival time please call [phone] between 1PM - 3PM on Fri 10/15/16 Fri  Remember: Instructions that are not followed completely may result in serious medical risk, up to and including death, or upon the discretion of your surgeon and anesthesiologist your surgery may need to be rescheduled.    _x___ 1. Do not eat food or drink liquids after midnight. No gum chewing or hard candies.     __x__ 2. No Alcohol for 24 hours before or after surgery.   __x__3. No Smoking for 24 prior to surgery.   ____  4. Bring all medications with you on the day of surgery if instructed.    __x__ 5. Notify your doctor if there is any change in your medical condition     (cold, fever, infections).     Do not wear jewelry, make-up, hairpins, clips or nail polish.  Do not wear lotions, powders, or perfumes. You may wear deodorant.  Do not shave 48 hours prior to surgery. Men may shave face and neck.  Do not bring valuables to the hospital.    [facility] is not responsible for any belongings or valuables.               Contacts, dentures or bridgework may not be worn into surgery.  Leave your suitcase in the car. After surgery it may be brought to your room.  For patients admitted to the hospital, discharge time is determined by your treatment team.   Patients discharged the day of surgery will not be allowed to drive home.    Please read over the following fact sheets that you were given:   [facility] Preparing for Surgery and or MRSA Information   _x___ Take these medicines the morning of surgery with A SIP OF WATER:    1. omeprazole (PRILOSEC) 20 MG capsule  2.  3.  4.  5.  6.  ____Fleets enema or Magnesium Citrate as directed.   _x___ Use CHG Soap or sage wipes as directed on instruction sheet   ____ Use inhalers on the day of surgery and bring to hospital day of  surgery  _x___ Stop metformin 2 days prior to surgery    _x_ Take 1/2 of usual insulin dose the night before surgery and none on the morning of           surgery.   _x___ Stop aspirin or coumadin, or plavix Stopped aspirin yesterday 10/13/16  x__ Stop Anti-inflammatories such as Advil, Aleve, Ibuprofen, Motrin, Naproxen,          Naprosyn, Goodies powders or aspirin products. Ok to take Tylenol.   _x___ Stop supplements until after surgery.  Stop fish oil.  ____ Bring C-Pap to the hospital.

## 2016-10-15 LAB — HEMOGLOBIN A1C
Hgb A1c MFr Bld: 6.6 % — ABNORMAL HIGH (ref 4.8–5.6)
Mean Plasma Glucose: 143 mg/dL

## 2016-10-17 MED ORDER — DEXTROSE 5 % IV SOLN
2.0000 g | INTRAVENOUS | Status: AC
  Administered 2016-10-18: 2 g via INTRAVENOUS
  Filled 2016-10-17: qty 2

## 2016-10-18 ENCOUNTER — Observation Stay
Admission: RE | Admit: 2016-10-18 | Discharge: 2016-10-19 | Disposition: A | Discharge status: Home / Self Care | Payer: Medicare Other | Source: Ambulatory Visit | Attending: Obstetrics and Gynecology | Admitting: Obstetrics and Gynecology

## 2016-10-18 ENCOUNTER — Ambulatory Visit: Payer: Medicare Other | Admitting: Anesthesiology

## 2016-10-18 ENCOUNTER — Encounter: Payer: Self-pay | Admitting: *Deleted

## 2016-10-18 ENCOUNTER — Encounter
Admission: RE | Disposition: A | Discharge status: Home / Self Care | Payer: Self-pay | Source: Ambulatory Visit | Attending: Obstetrics and Gynecology

## 2016-10-18 DIAGNOSIS — Z794 Long term (current) use of insulin: Secondary | ICD-10-CM | POA: Diagnosis not present

## 2016-10-18 DIAGNOSIS — I739 Peripheral vascular disease, unspecified: Secondary | ICD-10-CM | POA: Diagnosis not present

## 2016-10-18 DIAGNOSIS — E119 Type 2 diabetes mellitus without complications: Secondary | ICD-10-CM | POA: Diagnosis not present

## 2016-10-18 DIAGNOSIS — M1990 Unspecified osteoarthritis, unspecified site: Secondary | ICD-10-CM | POA: Diagnosis not present

## 2016-10-18 DIAGNOSIS — I10 Essential (primary) hypertension: Secondary | ICD-10-CM | POA: Insufficient documentation

## 2016-10-18 DIAGNOSIS — Z885 Allergy status to narcotic agent status: Secondary | ICD-10-CM | POA: Diagnosis not present

## 2016-10-18 DIAGNOSIS — Z8249 Family history of ischemic heart disease and other diseases of the circulatory system: Secondary | ICD-10-CM | POA: Insufficient documentation

## 2016-10-18 DIAGNOSIS — N8111 Cystocele, midline: Principal | ICD-10-CM | POA: Insufficient documentation

## 2016-10-18 DIAGNOSIS — M810 Age-related osteoporosis without current pathological fracture: Secondary | ICD-10-CM | POA: Insufficient documentation

## 2016-10-18 DIAGNOSIS — N816 Rectocele: Secondary | ICD-10-CM | POA: Diagnosis not present

## 2016-10-18 DIAGNOSIS — Z833 Family history of diabetes mellitus: Secondary | ICD-10-CM | POA: Insufficient documentation

## 2016-10-18 DIAGNOSIS — Z881 Allergy status to other antibiotic agents status: Secondary | ICD-10-CM | POA: Diagnosis not present

## 2016-10-18 DIAGNOSIS — E079 Disorder of thyroid, unspecified: Secondary | ICD-10-CM | POA: Insufficient documentation

## 2016-10-18 DIAGNOSIS — Z90710 Acquired absence of both cervix and uterus: Secondary | ICD-10-CM | POA: Insufficient documentation

## 2016-10-18 DIAGNOSIS — Z853 Personal history of malignant neoplasm of breast: Secondary | ICD-10-CM | POA: Diagnosis not present

## 2016-10-18 DIAGNOSIS — N819 Female genital prolapse, unspecified: Secondary | ICD-10-CM | POA: Diagnosis present

## 2016-10-18 DIAGNOSIS — G709 Myoneural disorder, unspecified: Secondary | ICD-10-CM | POA: Insufficient documentation

## 2016-10-18 DIAGNOSIS — Z823 Family history of stroke: Secondary | ICD-10-CM | POA: Diagnosis not present

## 2016-10-18 DIAGNOSIS — Z79899 Other long term (current) drug therapy: Secondary | ICD-10-CM | POA: Insufficient documentation

## 2016-10-18 DIAGNOSIS — K623 Rectal prolapse: Secondary | ICD-10-CM | POA: Insufficient documentation

## 2016-10-18 DIAGNOSIS — K219 Gastro-esophageal reflux disease without esophagitis: Secondary | ICD-10-CM | POA: Diagnosis not present

## 2016-10-18 DIAGNOSIS — N8110 Cystocele, unspecified: Secondary | ICD-10-CM | POA: Diagnosis not present

## 2016-10-18 HISTORY — PX: ANTERIOR AND POSTERIOR REPAIR: SHX5121

## 2016-10-18 LAB — GLUCOSE, CAPILLARY
GLUCOSE-CAPILLARY: 154 mg/dL — AB (ref 65–99)
GLUCOSE-CAPILLARY: 174 mg/dL — AB (ref 65–99)
GLUCOSE-CAPILLARY: 220 mg/dL — AB (ref 65–99)

## 2016-10-18 SURGERY — ANTERIOR (CYSTOCELE) AND POSTERIOR REPAIR (RECTOCELE)
Anesthesia: General | Site: Vagina | Wound class: Clean Contaminated

## 2016-10-18 MED ORDER — LIRAGLUTIDE 18 MG/3ML ~~LOC~~ SOPN: 1.8000 mg | PEN_INJECTOR | Freq: Every day | SUBCUTANEOUS | Status: DC

## 2016-10-18 MED ORDER — DEXAMETHASONE SODIUM PHOSPHATE 10 MG/ML IJ SOLN
INTRAMUSCULAR | Status: DC | PRN
  Administered 2016-10-18: 5 mg via INTRAVENOUS

## 2016-10-18 MED ORDER — LACTATED RINGERS IV SOLN
INTRAVENOUS | Status: DC
  Administered 2016-10-18 – 2016-10-19 (×3): via INTRAVENOUS

## 2016-10-18 MED ORDER — MAGNESIUM HYDROXIDE 400 MG/5ML PO SUSP: 30.0000 mL | Freq: Every day | ORAL | Status: DC | PRN

## 2016-10-18 MED ORDER — INSULIN GLARGINE 100 UNIT/ML ~~LOC~~ SOLN
26.0000 [IU] | Freq: Every day | SUBCUTANEOUS | Status: DC
  Administered 2016-10-18: 26 [IU] via SUBCUTANEOUS
  Filled 2016-10-18 (×2): qty 0.26

## 2016-10-18 MED ORDER — VASOPRESSIN 20 UNIT/ML IV SOLN
INTRAVENOUS | Status: AC
  Filled 2016-10-18: qty 1

## 2016-10-18 MED ORDER — ONDANSETRON HCL 4 MG/2ML IJ SOLN
INTRAMUSCULAR | Status: DC | PRN
  Administered 2016-10-18: 4 mg via INTRAVENOUS

## 2016-10-18 MED ORDER — LACTATED RINGERS IV SOLN
INTRAVENOUS | Status: DC
  Administered 2016-10-18: 13:00:00 via INTRAVENOUS

## 2016-10-18 MED ORDER — HYDROMORPHONE HCL 1 MG/ML IJ SOLN
0.2000 mg | INTRAMUSCULAR | Status: DC | PRN
  Administered 2016-10-18: 0.2 mg via INTRAVENOUS
  Filled 2016-10-18: qty 1

## 2016-10-18 MED ORDER — SODIUM CHLORIDE 0.9 % IJ SOLN
INTRAMUSCULAR | Status: AC
  Filled 2016-10-18: qty 50

## 2016-10-18 MED ORDER — SODIUM CHLORIDE 0.9 % IV SOLN
INTRAVENOUS | Status: DC
  Administered 2016-10-18: 10:00:00 via INTRAVENOUS

## 2016-10-18 MED ORDER — VASOPRESSIN 20 UNIT/ML IJ SOLN
INTRAMUSCULAR | Status: DC | PRN
  Administered 2016-10-18: 10 [IU]

## 2016-10-18 MED ORDER — DOCUSATE SODIUM 100 MG PO CAPS
100.0000 mg | ORAL_CAPSULE | Freq: Two times a day (BID) | ORAL | Status: DC
  Administered 2016-10-18 – 2016-10-19 (×3): 100 mg via ORAL
  Filled 2016-10-18 (×3): qty 1

## 2016-10-18 MED ORDER — ESTROGENS, CONJUGATED 0.625 MG/GM VA CREA
TOPICAL_CREAM | VAGINAL | Status: AC
  Filled 2016-10-18: qty 30

## 2016-10-18 MED ORDER — ONDANSETRON HCL 4 MG/2ML IJ SOLN
4.0000 mg | Freq: Once | INTRAMUSCULAR | Status: DC | PRN
Start: 2016-10-18 — End: 2016-10-18

## 2016-10-18 MED ORDER — SODIUM CHLORIDE FLUSH 0.9 % IV SOLN
INTRAVENOUS | Status: AC
  Filled 2016-10-18: qty 3

## 2016-10-18 MED ORDER — ZOLPIDEM TARTRATE 5 MG PO TABS: 5.0000 mg | ORAL_TABLET | Freq: Every evening | ORAL | Status: DC | PRN

## 2016-10-18 MED ORDER — MIDAZOLAM HCL 2 MG/2ML IJ SOLN
INTRAMUSCULAR | Status: DC | PRN
  Administered 2016-10-18: 1 mg via INTRAVENOUS

## 2016-10-18 MED ORDER — MAGNESIUM CITRATE PO SOLN
1.0000 | Freq: Once | ORAL | Status: DC | PRN
  Filled 2016-10-18: qty 296

## 2016-10-18 MED ORDER — ALUM & MAG HYDROXIDE-SIMETH 200-200-20 MG/5ML PO SUSP: 30.0000 mL | ORAL | Status: DC | PRN

## 2016-10-18 MED ORDER — ONDANSETRON HCL 4 MG PO TABS: 4.0000 mg | ORAL_TABLET | Freq: Four times a day (QID) | ORAL | Status: DC | PRN

## 2016-10-18 MED ORDER — LIDOCAINE HCL (CARDIAC) 20 MG/ML IV SOLN
INTRAVENOUS | Status: DC | PRN
  Administered 2016-10-18: 40 mg via INTRAVENOUS

## 2016-10-18 MED ORDER — FENTANYL CITRATE (PF) 100 MCG/2ML IJ SOLN
INTRAMUSCULAR | Status: AC
  Administered 2016-10-18: 25 ug via INTRAVENOUS
  Filled 2016-10-18: qty 2

## 2016-10-18 MED ORDER — VALSARTAN-HYDROCHLOROTHIAZIDE 160-25 MG PO TABS: 1.0000 | ORAL_TABLET | Freq: Every day | ORAL | Status: DC

## 2016-10-18 MED ORDER — SODIUM CHLORIDE 0.9 % IJ SOLN
INTRAMUSCULAR | Status: DC | PRN
  Administered 2016-10-18: 50 mL via INTRAVENOUS

## 2016-10-18 MED ORDER — EZETIMIBE 10 MG PO TABS
10.0000 mg | ORAL_TABLET | Freq: Every day | ORAL | Status: DC
  Filled 2016-10-18: qty 1

## 2016-10-18 MED ORDER — LABETALOL HCL 5 MG/ML IV SOLN
INTRAVENOUS | Status: DC | PRN
  Administered 2016-10-18: 10 mg via INTRAVENOUS

## 2016-10-18 MED ORDER — IRBESARTAN 150 MG PO TABS
150.0000 mg | ORAL_TABLET | Freq: Every day | ORAL | Status: DC
  Administered 2016-10-18 – 2016-10-19 (×2): 150 mg via ORAL
  Filled 2016-10-18 (×2): qty 1

## 2016-10-18 MED ORDER — ONDANSETRON HCL 4 MG/2ML IJ SOLN: 4.0000 mg | Freq: Four times a day (QID) | INTRAMUSCULAR | Status: DC | PRN

## 2016-10-18 MED ORDER — FENTANYL CITRATE (PF) 100 MCG/2ML IJ SOLN
INTRAMUSCULAR | Status: DC | PRN
  Administered 2016-10-18: 50 ug via INTRAVENOUS
  Administered 2016-10-18: 25 ug via INTRAVENOUS
  Administered 2016-10-18: 100 ug via INTRAVENOUS
  Administered 2016-10-18: 25 ug via INTRAVENOUS
  Administered 2016-10-18: 50 ug via INTRAVENOUS

## 2016-10-18 MED ORDER — METFORMIN HCL 500 MG PO TABS
1000.0000 mg | ORAL_TABLET | Freq: Every day | ORAL | Status: DC
  Administered 2016-10-19: 1000 mg via ORAL
  Filled 2016-10-18: qty 2

## 2016-10-18 MED ORDER — FENTANYL CITRATE (PF) 100 MCG/2ML IJ SOLN
25.0000 ug | INTRAMUSCULAR | Status: DC | PRN
  Administered 2016-10-18 (×2): 25 ug via INTRAVENOUS

## 2016-10-18 MED ORDER — HYDROCHLOROTHIAZIDE 25 MG PO TABS
25.0000 mg | ORAL_TABLET | Freq: Every day | ORAL | Status: DC
  Administered 2016-10-18 – 2016-10-19 (×2): 25 mg via ORAL
  Filled 2016-10-18 (×2): qty 1

## 2016-10-18 MED ORDER — INSULIN ASPART 100 UNIT/ML ~~LOC~~ SOLN
0.0000 [IU] | Freq: Three times a day (TID) | SUBCUTANEOUS | Status: DC
  Administered 2016-10-18: 5 [IU] via SUBCUTANEOUS
  Administered 2016-10-19: 2 [IU] via SUBCUTANEOUS
  Filled 2016-10-18 (×4): qty 0.15
  Filled 2016-10-18: qty 2
  Filled 2016-10-18 (×3): qty 0.15
  Filled 2016-10-18: qty 5

## 2016-10-18 MED ORDER — PROPOFOL 10 MG/ML IV BOLUS
INTRAVENOUS | Status: DC | PRN
  Administered 2016-10-18: 100 mg via INTRAVENOUS

## 2016-10-18 MED ORDER — IBUPROFEN 600 MG PO TABS
600.0000 mg | ORAL_TABLET | Freq: Four times a day (QID) | ORAL | Status: DC | PRN
  Administered 2016-10-18 – 2016-10-19 (×3): 600 mg via ORAL
  Filled 2016-10-18 (×4): qty 1

## 2016-10-18 MED ORDER — BISACODYL 5 MG PO TBEC: 5.0000 mg | DELAYED_RELEASE_TABLET | Freq: Every day | ORAL | Status: DC | PRN

## 2016-10-18 MED ORDER — GLYCOPYRROLATE 0.2 MG/ML IJ SOLN
INTRAMUSCULAR | Status: DC | PRN
  Administered 2016-10-18: .2 mg via INTRAVENOUS

## 2016-10-18 MED ORDER — ESTROGENS, CONJUGATED 0.625 MG/GM VA CREA
TOPICAL_CREAM | VAGINAL | Status: DC | PRN
  Administered 2016-10-18: 1 via VAGINAL

## 2016-10-18 MED ORDER — OXYCODONE-ACETAMINOPHEN 5-325 MG PO TABS: 1.0000 | ORAL_TABLET | ORAL | Status: DC | PRN

## 2016-10-18 SURGICAL SUPPLY — 34 items
BAG COUNTER SPONGE EZ (MISCELLANEOUS) ×2 IMPLANT
BAG SPNG 4X4 CLR HAZ (MISCELLANEOUS) ×1
BAG URO DRAIN 2000ML W/SPOUT (MISCELLANEOUS) ×3 IMPLANT
CANISTER SUCT 1200ML W/VALVE (MISCELLANEOUS) ×3 IMPLANT
CATH FOLEY 2WAY  5CC 16FR (CATHETERS) ×2
CATH FOLEY 2WAY 5CC 16FR (CATHETERS) ×1
CATH URTH 16FR FL 2W BLN LF (CATHETERS) ×1 IMPLANT
COUNTER SPONGE BAG EZ (MISCELLANEOUS) ×1
DRAPE PERI LITHO V/GYN (MISCELLANEOUS) ×3 IMPLANT
DRAPE SHEET LG 3/4 BI-LAMINATE (DRAPES) ×3 IMPLANT
DRAPE UNDER BUTTOCK W/FLU (DRAPES) ×3 IMPLANT
ELECT REM PT RETURN 9FT ADLT (ELECTROSURGICAL) ×3
ELECTRODE REM PT RTRN 9FT ADLT (ELECTROSURGICAL) ×1 IMPLANT
GAUZE PACK 2X3YD (MISCELLANEOUS) ×3 IMPLANT
GLOVE BIO SURGEON STRL SZ 6 (GLOVE) ×3 IMPLANT
GLOVE BIO SURGEON STRL SZ 6.5 (GLOVE) ×1 IMPLANT
GLOVE BIO SURGEONS STRL SZ 6.5 (GLOVE) ×1
GLOVE BIOGEL PI IND STRL 6.5 (GLOVE) ×1 IMPLANT
GLOVE BIOGEL PI INDICATOR 6.5 (GLOVE) ×2
GOWN STRL REUS W/ TWL LRG LVL3 (GOWN DISPOSABLE) ×2 IMPLANT
GOWN STRL REUS W/TWL LRG LVL3 (GOWN DISPOSABLE) ×12
KIT RM TURNOVER CYSTO AR (KITS) ×3 IMPLANT
LABEL OR SOLS (LABEL) ×3 IMPLANT
NEEDLE HYPO 22GX1.5 SAFETY (NEEDLE) ×4 IMPLANT
NS IRRIG 500ML POUR BTL (IV SOLUTION) ×3 IMPLANT
PACK BASIN MINOR ARMC (MISCELLANEOUS) ×3 IMPLANT
PAD PREP 24X41 OB/GYN DISP (PERSONAL CARE ITEMS) ×3 IMPLANT
SUT CHROMIC 2 0 CT 1 (SUTURE) ×12 IMPLANT
SUT VIC AB 0 CT1 36 (SUTURE) ×10 IMPLANT
SUT VIC AB 0 CT2 27 (SUTURE) ×6 IMPLANT
SUT VIC AB 2-0 UR6 27 (SUTURE) IMPLANT
SYR 3ML LL SCALE MARK (SYRINGE) ×2 IMPLANT
SYR CONTROL 10ML (SYRINGE) ×2 IMPLANT
SYRINGE 10CC LL (SYRINGE) ×3 IMPLANT

## 2016-10-18 NOTE — Anesthesia Procedure Notes (Signed)
Performed by: Kennon Holter

## 2016-10-18 NOTE — H&P (Signed)
UPDATE TO PREVIOUS HISTORY AND PHYSICAL  The patient has been seen and examined.  H&P is up to date, no changes noted.  Patient is a 75 y.o. P81 female scheduled for anterior and posterior vaginal repair. Indications for procedure are vaginal vault prolapse (cystocele with rectocele) and vaginal pressure.  Patient can proceed to the OR for scheduled procedure.   Rubie Maid, MD 10/18/2016 9:54 AM

## 2016-10-18 NOTE — Anesthesia Postprocedure Evaluation (Signed)
Anesthesia Post Note  Patient: [name]  Procedure(s) Performed: Procedure(s) (LRB): ANTERIOR (CYSTOCELE) AND POSTERIOR REPAIR (RECTOCELE) (N/A)  Patient location during evaluation: PACU Anesthesia Type: General Level of consciousness: awake and alert Pain management: pain level controlled Vital Signs Assessment: post-procedure vital signs reviewed and stable Respiratory status: spontaneous breathing, nonlabored ventilation, respiratory function stable and patient connected to nasal cannula oxygen Cardiovascular status: blood pressure returned to baseline and stable Postop Assessment: no signs of nausea or vomiting Anesthetic complications: no    Last Vitals:  Vitals:   10/18/16 1437 10/18/16 1537  BP: (!) 144/63 (!) 145/68  Pulse: 100 (!) 102  Resp: 16 16  Temp: 36.4 C 36.7 C    Last Pain:  Vitals:   10/18/16 1537  TempSrc: Oral  PainSc:                  Kionte Baumgardner S

## 2016-10-18 NOTE — Anesthesia Preprocedure Evaluation (Signed)
Anesthesia Evaluation  Patient identified by MRN, date of birth, ID band Patient awake    Reviewed: Allergy & Precautions, NPO status , Patient's Chart, lab work & pertinent test results, reviewed documented beta blocker date and time   Airway Mallampati: III  TM Distance: >3 FB     Dental  (+) Chipped   Pulmonary           Cardiovascular hypertension, Pt. on medications + Peripheral Vascular Disease       Neuro/Psych  Neuromuscular disease    GI/Hepatic GERD  Controlled,  Endo/Other  diabetes, Type 2Hypothyroidism   Renal/GU      Musculoskeletal  (+) Arthritis ,   Abdominal   Peds  Hematology   Anesthesia Other Findings   Reproductive/Obstetrics                             Anesthesia Physical Anesthesia Plan  ASA: III  Anesthesia Plan: General   Post-op Pain Management:    Induction: Intravenous  Airway Management Planned: LMA  Additional Equipment:   Intra-op Plan:   Post-operative Plan:   Informed Consent: I have reviewed the patients History and Physical, chart, labs and discussed the procedure including the risks, benefits and alternatives for the proposed anesthesia with the patient or authorized representative who has indicated his/her understanding and acceptance.     Plan Discussed with: CRNA  Anesthesia Plan Comments:         Anesthesia Quick Evaluation

## 2016-10-18 NOTE — Op Note (Signed)
Procedure(s):  POSTERIOR REPAIR (RECTOCELE) AND PERINEOPLASTY Procedure Note  [name] female 75 y.o. 10/18/2016  Indications: The patient is a 75 y.o. 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 The risks, benefits, complications, treatment options, and expected outcomes were discussed with the patient.  The patient concurred with the proposed plan, giving informed consent.  The site of surgery properly noted/marked. The patient was taken to the Operating Room, identified as [name] and the procedure verified as Procedure(s) (LRB): ANTERIOR (CYSTOCELE) AND POSTERIOR REPAIR (RECTOCELE) (N/A).   She was then placed under general anesthesia without difficulty. She was placed in the dorsal lithotomy position, and was prepped and draped in a sterile manner.  A Time Out was held and the above information confirmed.  A foley catheter was placed.  The bladder was then retracted superiorly. The hymenal ring was grasped with Allice clamps at the 4 and 8 o'clock positions. The posterior vaginal wall was grasped with an Allice clamp as close to the apex of the vagina as possible.  A diamond incision was made to within a centimeter of the anus. The intervening tissue was excised, including the scarred perineal body (posterior fourchette). The vagina was then dissected in the midline to within a centimeter of the vaginal apex using Metzenbaum scissors. The perineal body was freed and the rectum freed from the overlying vagina.  The perirectal fascia was then transversely sutured in the midline with several interrupted 0 Vicryl sutures for  support.  A second closure layer of the perirectal fascia was performed for reinforcement using several interrupted 0-Vicryl sutures.   Redundant vaginal mucosa was excised and the vagina was closed in the midline in an interrupted fashion with 0 - Chromic sutures. Perineal sutures were placed using 0 Vicryl for re-approximation of the perineal body.  The skin was closed using 0-Vicryl in an interrupted fashion.  Rectal exam was negative. The patient tolerated the procedure well.   Attention was then turned to the bladder where they 2nd degree cystocele seemed to be reduced spontaneously almost to a 1st degree. Patient had not previously complained of urinary symptoms, so the decision was made not to proceed with cystocele repair.  A Foley catheter drained approximately 35mL of clear urine.  A vaginal packing coated with premarin cream was placed into the patient's vagina.   The patient tolerated the procedures well.  All instruments, needles, and sponge counts were correct x 2. The patient was taken to the recovery room awake, extubated and in stable condition.   Findings: 2nd degree cystocele 3rd degree rectocele  Scarring of perineal body  Estimated Blood Loss:  50 ml      Drains: foley catheter 50  ml of clear urine         Total IV Fluids:  700 ml  Specimens: None         Implants: None         Complications:  None; patient tolerated the procedure well.         Disposition: PACU - hemodynamically stable.         Condition: stable   Rubie Maid, MD [facility]

## 2016-10-18 NOTE — Transfer of Care (Signed)
Immediate Anesthesia Transfer of Care Note  Patient: [name]  Procedure(s) Performed: Procedure(s): ANTERIOR (CYSTOCELE) AND POSTERIOR REPAIR (RECTOCELE) (N/A)  Patient Location: PACU  Anesthesia Type:General  Level of Consciousness: awake, alert  and oriented  Airway & Oxygen Therapy: Patient Spontanous Breathing and Patient connected to face mask oxygen  Post-op Assessment: Report given to RN, Post -op Vital signs reviewed and stable and Patient moving all extremities X 4  Post vital signs: Reviewed and stable  Last Vitals:  Vitals:   10/18/16 0909  BP: (!) 181/86  Pulse: (!) 103  Resp: 18  Temp: 36.8 C    Last Pain:  Vitals:   10/18/16 0909  TempSrc: Oral         Complications: No apparent anesthesia complications

## 2016-10-18 NOTE — Anesthesia Procedure Notes (Signed)
Procedure Name: LMA Insertion Date/Time: 10/18/2016 10:19 AM Performed by: Kennon Holter Pre-anesthesia Checklist: Patient identified, Patient being monitored, Timeout performed, Emergency Drugs available and Suction available Patient Re-evaluated:Patient Re-evaluated prior to inductionOxygen Delivery Method: Circle system utilized Preoxygenation: Pre-oxygenation with 100% oxygen Intubation Type: IV induction Ventilation: Mask ventilation without difficulty LMA: LMA inserted Tube type: Oral Number of attempts: 1 Placement Confirmation: positive ETCO2 and breath sounds checked- equal and bilateral Tube secured with: Tape Dental Injury: Teeth and Oropharynx as per pre-operative assessment

## 2016-10-19 DIAGNOSIS — N8111 Cystocele, midline: Secondary | ICD-10-CM | POA: Diagnosis not present

## 2016-10-19 LAB — CBC WITH DIFFERENTIAL/PLATELET
Basophils Absolute: 0 10*3/uL (ref 0–0.1)
Basophils Relative: 0 %
Eosinophils Absolute: 0 10*3/uL (ref 0–0.7)
Eosinophils Relative: 0 %
HEMATOCRIT: 30.8 % — AB (ref 35.0–47.0)
HEMOGLOBIN: 10.3 g/dL — AB (ref 12.0–16.0)
LYMPHS PCT: 12 %
Lymphs Abs: 1.7 10*3/uL (ref 1.0–3.6)
MCH: 29.5 pg (ref 26.0–34.0)
MCHC: 33.4 g/dL (ref 32.0–36.0)
MCV: 88.4 fL (ref 80.0–100.0)
Monocytes Absolute: 1.1 10*3/uL — ABNORMAL HIGH (ref 0.2–0.9)
Monocytes Relative: 8 %
NEUTROS ABS: 11 10*3/uL — AB (ref 1.4–6.5)
NEUTROS PCT: 80 %
Platelets: 161 10*3/uL (ref 150–440)
RBC: 3.49 MIL/uL — AB (ref 3.80–5.20)
RDW: 12.9 % (ref 11.5–14.5)
WBC: 13.9 10*3/uL — AB (ref 3.6–11.0)

## 2016-10-19 LAB — GLUCOSE, CAPILLARY: GLUCOSE-CAPILLARY: 122 mg/dL — AB (ref 65–99)

## 2016-10-19 MED ORDER — DOCUSATE SODIUM 100 MG PO CAPS: 100.0000 mg | ORAL_CAPSULE | Freq: Two times a day (BID) | ORAL | 2 refills | Status: DC | PRN

## 2016-10-19 MED ORDER — LACTATED RINGERS IV BOLUS (SEPSIS)
500.0000 mL | Freq: Once | INTRAVENOUS | Status: AC
  Administered 2016-10-19: 500 mL via INTRAVENOUS

## 2016-10-19 MED ORDER — ACETAMINOPHEN ER 650 MG PO TBCR: 650.0000 mg | EXTENDED_RELEASE_TABLET | Freq: Three times a day (TID) | ORAL | 1 refills | Status: AC | PRN

## 2016-10-19 MED ORDER — OXYCODONE HCL 5 MG PO CAPS: 5.0000 mg | ORAL_CAPSULE | ORAL | 0 refills | Status: DC | PRN

## 2016-10-19 NOTE — Discharge Instructions (Signed)
General Gynecological Post-Operative Instructions °You may expect to feel dizzy, weak, and drowsy for as long as 24 hours after receiving the medicine that made you sleep (anesthetic).  °Do not drive a car, ride a bicycle, participate in physical activities, or take public transportation until you are done taking narcotic pain medicines or as directed by your doctor.  °Do not drink alcohol or take tranquilizers.  °Do not take medicine that has not been prescribed by your doctor.  °Do not sign important papers or make important decisions while on narcotic pain medicines.  °Have a responsible person with you.  °CARE OF INCISION  °Keep incision clean and dry. °Take showers instead of baths until your doctor gives you permission to take baths.  °Avoid heavy lifting (more than 10 pounds/4.5 kilograms), pushing, or pulling.  °Avoid activities that may risk injury to your surgical site.  °No sexual intercourse or placement of anything in the vagina for 6 weeks or as instructed by your doctor. °If you have tubes coming from the wound site, check with your doctor regarding appropriate care of the tubes. °Only take prescription or over-the-counter medicines  for pain, discomfort, or fever as directed by your doctor. Do not take aspirin. It can make you bleed. Take medicines (antibiotics) that kill germs if they are prescribed for you.  °Call the office or go to the MAU if:  °You feel sick to your stomach (nauseous).  °You start to throw up (vomit).  °You have trouble eating or drinking.  °You have an oral temperature above 101.  °You have constipation that is not helped by adjusting diet or increasing fluid intake. Pain medicines are a common cause of constipation.  °You have any other concerns. °SEEK IMMEDIATE MEDICAL CARE IF:  °You have persistent dizziness.  °You have difficulty breathing or a congested sounding (croupy) cough.  °You have an oral temperature above 102.5, not controlled by medicine.  °There is increasing  pain or tenderness near or in the surgical site.  ° °

## 2016-10-19 NOTE — Progress Notes (Signed)
Pt discharged home.  Discharge instructions, prescriptions and follow up appointment given to and reviewed with pt.  Pt verbalized understanding.  Escorted by auxillary. 

## 2016-10-19 NOTE — Progress Notes (Signed)
1 Day Post-Op Procedure(s) (LRB): ANTERIOR (CYSTOCELE) AND POSTERIOR REPAIR (RECTOCELE) (N/A)  Subjective: Patient reports tolerating PO and no problems voiding.   Denies nausea/vomiting. Pain controlled with pain meds. Report pain is only mild.   Objective: I have reviewed patient's vital signs, intake and output and labs.  Vitals:   10/18/16 2330 10/19/16 0013 10/19/16 0408 10/19/16 0902  BP: (!) 113/42 (!) 134/50 (!) 138/53 (!) 138/58  Pulse: (!) 102 99 100 88  Resp: 20  18 18   Temp: 99.6 F (37.6 C)  98.2 F (36.8 C) 97.6 F (36.4 C)  TempSrc: Oral  Oral Oral  SpO2: 96%  97% 99%  Weight:      Height:        General: alert and no distress Resp: clear to auscultation bilaterally Cardio: regular rate and rhythm, S1, S2 normal, no murmur, click, rub or gallop GI: soft, non-tender; bowel sounds normal; no masses,  no organomegaly Extremities: extremities normal, atraumatic, no cyanosis or edema. SCDs in place. Vaginal Bleeding: minimal   Labs:  CBC Latest Ref Rng & Units 10/19/2016 10/14/2016 03/24/2016  WBC 3.6 - 11.0 K/uL 13.9(H) 8.8 8.8  Hemoglobin 12.0 - 16.0 g/dL 10.3(L) 13.3 12.9  Hematocrit 35.0 - 47.0 % 30.8(L) 37.5 38.0  Platelets 150 - 440 K/uL 161 358 413.0(H)    Assessment: s/p Procedure(s): OSTERIOR REPAIR (RECTOCELE) (N/A): stable, progressing well and tolerating diet  Plan: Advance diet Encourage ambulation Continue to PO medication Discharge home    LOS: 0 days    Rubie Maid, MD 10/19/2016, 9:04 AM

## 2016-10-25 NOTE — Discharge Summary (Signed)
Gynecology Physician Postoperative Discharge Summary  Patient ID: [name] MRN: [id number] DOB/AGE: 77/77/1942 77 y.o.  Admit Date: 10/18/2016 Discharge Date: 10/19/2016  Preoperative Diagnoses: Cystocele (small) with Rectocele  Procedures: Procedure(s) (LRB): POSTERIOR REPAIR (RECTOCELE) (N/A)  Hospital Course:  [name] is a 75 y.o. [id number] admitted for scheduled surgery.  She underwent the procedures as mentioned above, her operation was uncomplicated. For further details about surgery, please refer to the operative report. Patient had an uncomplicated postoperative course. By time of discharge on POD#1, her pain was controlled on oral pain medications; she was ambulating, voiding without difficulty, tolerating regular diet and passing flatus. She was deemed stable for discharge to home.   Significant Labs: CBC Latest Ref Rng & Units 10/19/2016 10/14/2016 03/24/2016  WBC 3.6 - 11.0 K/uL 13.9(H) 8.8 8.8  Hemoglobin 12.0 - 16.0 g/dL 10.3(L) 13.3 12.9  Hematocrit 35.0 - 47.0 % 30.8(L) 37.5 38.0  Platelets 150 - 440 K/uL 161 358 413.0(H)    Discharge Exam: Blood pressure (!) 138/58, pulse 88, temperature 97.6 F (36.4 C), temperature source Oral, resp. rate 18, height '5\' 5"'  (1.651 m), weight 182 lb (82.6 kg), SpO2 99 %. General appearance: alert and no distress  Resp: clear to auscultation bilaterally  Cardio: regular rate and rhythm  GI: soft, non-tender; bowel sounds normal; no masses, no organomegaly.  Pelvic: minimal blood on pad, sutures intact Extremities: extremities normal, atraumatic, no cyanosis or edema and Homans sign is negative, no sign of DVT  Discharged Condition: Stable  Disposition: 01-Home or Self Care     Medication List    TAKE these medications   ACCU-CHEK AVIVA PLUS test strip Generic drug:  glucose blood USE 1 STRIP TWICE A DAY TO CHECK BLOOD SUGAR   ACCU-CHEK AVIVA PLUS w/Device Kit Use to check blood sugar twice daily   accu-chek  multiclix lancets Use as instructed to check blood sugar twice daily E11.9   aspirin 81 MG tablet Take 81 mg by mouth at bedtime.   BD PEN NEEDLE NANO U/F 32G X 4 MM Misc Generic drug:  Insulin Pen Needle USE 1 PEN NEEDLE DAILY   CENTRUM SILVER PO Take 1 tablet by mouth daily.   clobetasol ointment 0.05 % Commonly known as:  TEMOVATE Apply nightly to irritated area x 2 weeks.   cyanocobalamin 1000 MCG/ML injection Commonly known as:  (VITAMIN B-12) INJECT 1 ML ONCE A MONTH   diphenhydramine-acetaminophen 25-500 MG Tabs tablet Commonly known as:  TYLENOL PM Take 0.5 tablets by mouth at bedtime as needed (sleep).   docusate sodium 100 MG capsule Commonly known as:  COLACE Take 1 capsule (100 mg total) by mouth 2 (two) times daily as needed for mild constipation.   ezetimibe 10 MG tablet Commonly known as:  ZETIA TAKE ONE (1) TABLET EACH DAY What changed:  See the new instructions.   FISH OIL PO Take 1 capsule by mouth daily.   FLUARIX QUADRIVALENT 0.5 ML injection Generic drug:  Influenza vac split quadrivalent PF   fluticasone 50 MCG/ACT nasal spray Commonly known as:  FLONASE Place 2 sprays into both nostrils daily. What changed:  when to take this  reasons to take this   LANTUS SOLOSTAR 100 UNIT/ML Solostar Pen Generic drug:  Insulin Glargine INJECT 26 UNITS INTO THE SKIN AT BEDTIME   metFORMIN 1000 MG tablet Commonly known as:  GLUCOPHAGE Take 1 tablet (1,000 mg total) by mouth daily with lunch.   naproxen sodium 220 MG tablet Commonly known as:  ANAPROX Take 440 mg by mouth daily.   omeprazole 20 MG capsule Commonly known as:  PRILOSEC TAKE ONE (1) CAPSULE EACH DAY   OVER THE COUNTER MEDICATION Take 1 tablet by mouth daily. Immune System Support Supplement includes :  Echinacea Zinc Vitamin C   oxycodone 5 MG capsule Commonly known as:  OXY-IR Take 1 capsule (5 mg total) by mouth every 4 (four) hours as needed.   terconazole 0.4 % vaginal  cream Commonly known as:  TERAZOL 7 Place 1 applicator vaginally at bedtime.   acetaminophen 650 MG CR tablet Commonly known as:  TYLENOL 8 HOUR Take 1 tablet (650 mg total) by mouth every 8 (eight) hours as needed for pain. What changed:  You were already taking a medication with the same name, and this prescription was added. Make sure you understand how and when to take each.   TYLENOL ARTHRITIS PAIN 650 MG CR tablet Generic drug:  acetaminophen Take 1,300 mg by mouth every 8 (eight) hours as needed for pain. What changed:  Another medication with the same name was added. Make sure you understand how and when to take each.   valsartan-hydrochlorothiazide 160-25 MG tablet Commonly known as:  DIOVAN-HCT TAKE ONE (1) TABLET EACH DAY   VICTOZA 18 MG/3ML Sopn Generic drug:  liraglutide INJECT 1.8 MG INTO THE SKIN DAILY.      Follow-up Information    Rubie Maid, MD. Go in 1 week(s).   Specialties:  Obstetrics and Gynecology, Radiology Why:  11/14 @ 3:15 with Dr. Marcelline Mates for post-op follow up. Contact information: [address] [address] [phone]           Signed:  Rubie Maid, MD [facility]

## 2016-10-26 ENCOUNTER — Other Ambulatory Visit: Payer: Self-pay | Admitting: Family Medicine

## 2016-10-26 ENCOUNTER — Encounter: Payer: Self-pay | Admitting: Obstetrics and Gynecology

## 2016-10-26 ENCOUNTER — Ambulatory Visit (INDEPENDENT_AMBULATORY_CARE_PROVIDER_SITE_OTHER): Payer: Medicare Other | Admitting: Obstetrics and Gynecology

## 2016-10-26 VITALS — BP 157/77 | HR 106 | Ht 65.25 in | Wt 185.1 lb

## 2016-10-26 DIAGNOSIS — Z4889 Encounter for other specified surgical aftercare: Secondary | ICD-10-CM

## 2016-10-26 DIAGNOSIS — R6889 Other general symptoms and signs: Secondary | ICD-10-CM

## 2016-10-26 NOTE — Progress Notes (Signed)
    OBSTETRICS/GYNECOLOGY POST-OPERATIVE CLINIC VISIT  Subjective:     [name] is a 75 y.o. female who presents to the clinic 1 weeks status post posterior colporrhaphy for pelvic relaxation. Eating a regular diet without difficulty (although continues to note early satiety, was also ongoing prior to surgery). Bowel movements are normal. Pain is controlled with current analgesics. Medications being used: prescription NSAID's including ibuprofen (Motrin) and narcotic analgesics including oxycodone/acetaminophen (Percocet, Tylox).  Notes occasionally having soreness, tries no to take Percocet often.    The following portions of the patient's history were reviewed and updated as appropriate: allergies, current medications, past family history, past medical history, past social history, past surgical history and problem list.  Review of Systems A comprehensive review of systems was negative except for: Endocrine: positive for temperature intolerance (patient notes h/o cold intolerance, borderline thyroid levels), has worsened since surgery.    Objective:    BP (!) 157/77 (BP Location: Left Arm, Patient Position: Sitting, Cuff Size: Large)   Pulse (!) 106   Ht 5' 5.25" (1.657 m)   Wt 185 lb 1.6 oz (84 kg)   BMI 30.57 kg/m  General:  alert and no distress  Abdomen: soft, bowel sounds active, non-tender  Pelvis: :   deferred    Pathology:  None  Assessment:    Doing well postoperatively. S/p posterior colporrhaphy. Cold intolerance  Plan:   1. Continue any current medications. 2. Wound care discussed. 3. Operative findings again reviewed. Pathology report discussed. 4. Activity restrictions: no bending, stooping, or squatting and no lifting more than 15 pounds 5. Anticipated return to work: 4 weeks. 6. Follow up: 5 weeks for final post-op check.     Rubie Maid, MD [facility]

## 2016-11-12 ENCOUNTER — Other Ambulatory Visit: Payer: Self-pay | Admitting: Family Medicine

## 2016-11-30 ENCOUNTER — Encounter: Payer: Medicare Other | Admitting: Obstetrics and Gynecology

## 2016-12-01 ENCOUNTER — Encounter: Payer: Self-pay | Admitting: Obstetrics and Gynecology

## 2016-12-01 ENCOUNTER — Ambulatory Visit (INDEPENDENT_AMBULATORY_CARE_PROVIDER_SITE_OTHER): Payer: Medicare Other | Admitting: Obstetrics and Gynecology

## 2016-12-01 VITALS — BP 132/74 | HR 105 | Ht 62.5 in | Wt 185.2 lb

## 2016-12-01 DIAGNOSIS — N952 Postmenopausal atrophic vaginitis: Secondary | ICD-10-CM

## 2016-12-01 DIAGNOSIS — N8110 Cystocele, unspecified: Secondary | ICD-10-CM

## 2016-12-01 DIAGNOSIS — Z9889 Other specified postprocedural states: Secondary | ICD-10-CM

## 2016-12-01 DIAGNOSIS — R350 Frequency of micturition: Secondary | ICD-10-CM | POA: Diagnosis not present

## 2016-12-01 DIAGNOSIS — R5383 Other fatigue: Secondary | ICD-10-CM

## 2016-12-03 LAB — POCT URINALYSIS DIPSTICK
BILIRUBIN UA: NEGATIVE
GLUCOSE UA: NEGATIVE
KETONES UA: NEGATIVE
Leukocytes, UA: NEGATIVE
Nitrite, UA: NEGATIVE
Protein, UA: NEGATIVE
RBC UA: NEGATIVE
SPEC GRAV UA: 1.015
Urobilinogen, UA: NEGATIVE
pH, UA: 6

## 2016-12-07 DIAGNOSIS — R350 Frequency of micturition: Secondary | ICD-10-CM | POA: Insufficient documentation

## 2016-12-07 DIAGNOSIS — N952 Postmenopausal atrophic vaginitis: Secondary | ICD-10-CM | POA: Insufficient documentation

## 2016-12-07 DIAGNOSIS — N8110 Cystocele, unspecified: Secondary | ICD-10-CM | POA: Insufficient documentation

## 2016-12-07 NOTE — Progress Notes (Signed)
    OBSTETRICS/GYNECOLOGY POST-OPERATIVE CLINIC VISIT  Subjective:     [name] is a 75 y.o. female who presents to the clinic 6 weeks status post posterior colporrhaphy for pelvic relaxation. Eating a regular diet without difficulty. Bowel movements are normal.  Notes bowel movements have improved significantly.  The patient is not having any pain.     The following portions of the patient's history were reviewed and updated as appropriate: allergies, current medications, past family history, past medical history, past social history, past surgical history and problem list.  Review of Systems A comprehensive review of systems was negative except for: Constitutional: positive for fatigue (states she feels as though she hasn't had much energy since the surgery). Genitourinary: positive for frequency.  Also still notes occasional stress urinary incontinence when changing positions.    Objective:  .  BP 132/74 (BP Location: Left Arm, Patient Position: Sitting, Cuff Size: Large)   Pulse (!) 105   Ht 5' 2.5" (1.588 m)   Wt 185 lb 3.2 oz (84 kg)   BMI 33.33 kg/m  General:  alert and no distress  Abdomen: soft, bowel sounds active, non-tender  Pelvis:  external genitalia normal, rectovaginal septum normal.  Vagina without discharge or lesions.  Grade 1 cystocele present. Atrophy present.  Uterus and cervix surgically absent.  Patient      Labs:  Results for orders placed or performed in visit on 12/01/16  POCT urinalysis dipstick  Result Value Ref Range   Color, UA yellow    Clarity, UA clear    Glucose, UA neg    Bilirubin, UA neg    Ketones, UA neg    Spec Grav, UA 1.015    Blood, UA neg    pH, UA 6.0    Protein, UA neg    Urobilinogen, UA negative    Nitrite, UA neg    Leukocytes, UA Negative Negative    Assessment:    S/p posterior colporrhaphy for rectocele  Fatigue Urinary frequency  Vaginal atrophy Cystocele, Grade I  Plan:   1. Continue any current  medications. 2. UA negative. Encouraged adequate hydration, Azo if needed. Encouraged Kegel exercises.  3. Operative findings again reviewed.  4. Activity restrictions: none 5. Anticipated return to work: not applicable. 6. Fatigue, likely still secondary to effects of anesthesia.  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  Discussed attempting to get as much sunlight as possible.  7. Vaginal atrophy present.  Discussed management options such as vaginal estrogen cream, as it may also help with urinary symptoms. Patient with remote h/o breast cancer.  Discussed risks vs benefits. Can use for short term (2-3 months), and can use around urethra.  8. Cystocele, Grade 1 - discussed Kegel exercises.  Did not qualify for surgical management at time of posterior repair.  Could potentially use pessary device if symptoms not relieved. 9.Follow up: if symptoms fail to improve, or worsen      Rubie Maid, MD [facility]

## 2016-12-08 LAB — URINE CULTURE

## 2016-12-09 ENCOUNTER — Telehealth: Payer: Self-pay

## 2016-12-09 DIAGNOSIS — A498 Other bacterial infections of unspecified site: Secondary | ICD-10-CM

## 2016-12-09 MED ORDER — AMOXICILLIN 500 MG PO CAPS: 500.0000 mg | ORAL_CAPSULE | Freq: Three times a day (TID) | ORAL | 0 refills | Status: AC

## 2016-12-09 NOTE — Telephone Encounter (Signed)
-----   Message from Rubie Maid, MD sent at 12/08/2016  5:18 PM EST ----- Patient with small amount of bacteria on urine culture (not enough to qualify as a UTI).  We can notify patient of results.  If she is still symptomatic with UTI symptoms, we can treat with Amoxicillin or Ampicillin 4 x daily for 7 days.

## 2016-12-09 NOTE — Telephone Encounter (Signed)
Pt calls back states that she is still symptomatic (lower back pain) informed pt of results. States she would like to be treated Rx sent in, per Dr.cherry.

## 2016-12-09 NOTE — Telephone Encounter (Signed)
Called pt, husband answers states pt is asleep, LM w/ spouse for pt to call back

## 2016-12-16 ENCOUNTER — Telehealth: Payer: Self-pay | Admitting: *Deleted

## 2016-12-16 NOTE — Telephone Encounter (Signed)
Form received indicating PA required for victoza. Form completed and faxed. Awaiting approval

## 2016-12-16 NOTE — Telephone Encounter (Signed)
Call received from Amy at [facility] stating PA not required. Spoke to [employer] who also states PA is not required and pt picked up med 1/2.

## 2016-12-20 ENCOUNTER — Other Ambulatory Visit: Payer: Self-pay | Admitting: Family Medicine

## 2017-01-03 ENCOUNTER — Other Ambulatory Visit: Payer: Self-pay | Admitting: Family Medicine

## 2017-01-28 ENCOUNTER — Encounter: Payer: Self-pay | Admitting: Emergency Medicine

## 2017-01-28 ENCOUNTER — Telehealth: Payer: Self-pay | Admitting: Family Medicine

## 2017-01-28 ENCOUNTER — Emergency Department
Admission: EM | Admit: 2017-01-28 | Discharge: 2017-01-28 | Disposition: A | Discharge status: Home / Self Care | Payer: Medicare Other | Attending: Emergency Medicine | Admitting: Emergency Medicine

## 2017-01-28 ENCOUNTER — Emergency Department: Payer: Medicare Other

## 2017-01-28 DIAGNOSIS — Z7982 Long term (current) use of aspirin: Secondary | ICD-10-CM | POA: Insufficient documentation

## 2017-01-28 DIAGNOSIS — R112 Nausea with vomiting, unspecified: Secondary | ICD-10-CM

## 2017-01-28 DIAGNOSIS — R42 Dizziness and giddiness: Secondary | ICD-10-CM | POA: Diagnosis not present

## 2017-01-28 DIAGNOSIS — R10A1 Flank pain, right side: Secondary | ICD-10-CM

## 2017-01-28 DIAGNOSIS — E119 Type 2 diabetes mellitus without complications: Secondary | ICD-10-CM | POA: Insufficient documentation

## 2017-01-28 DIAGNOSIS — R109 Unspecified abdominal pain: Secondary | ICD-10-CM | POA: Diagnosis present

## 2017-01-28 DIAGNOSIS — I10 Essential (primary) hypertension: Secondary | ICD-10-CM | POA: Diagnosis not present

## 2017-01-28 DIAGNOSIS — Z7984 Long term (current) use of oral hypoglycemic drugs: Secondary | ICD-10-CM | POA: Insufficient documentation

## 2017-01-28 LAB — URINALYSIS, COMPLETE (UACMP) WITH MICROSCOPIC
Bacteria, UA: NONE SEEN
Bilirubin Urine: NEGATIVE
Glucose, UA: 50 mg/dL — AB
HGB URINE DIPSTICK: NEGATIVE
Ketones, ur: 5 mg/dL — AB
Leukocytes, UA: NEGATIVE
Nitrite: NEGATIVE
Protein, ur: NEGATIVE mg/dL
SPECIFIC GRAVITY, URINE: 1.014 (ref 1.005–1.030)
pH: 6 (ref 5.0–8.0)

## 2017-01-28 LAB — COMPREHENSIVE METABOLIC PANEL
ALBUMIN: 4.1 g/dL (ref 3.5–5.0)
ALK PHOS: 52 U/L (ref 38–126)
ALT: 19 U/L (ref 14–54)
AST: 32 U/L (ref 15–41)
Anion gap: 12 (ref 5–15)
BUN: 16 mg/dL (ref 6–20)
CALCIUM: 9.3 mg/dL (ref 8.9–10.3)
CO2: 25 mmol/L (ref 22–32)
CREATININE: 0.74 mg/dL (ref 0.44–1.00)
Chloride: 92 mmol/L — ABNORMAL LOW (ref 101–111)
GFR calc Af Amer: >60 mL/min (ref >60)
GFR calc non Af Amer: >60 mL/min (ref >60)
GLUCOSE: 223 mg/dL — AB (ref 65–99)
Potassium: 3.8 mmol/L (ref 3.5–5.1)
SODIUM: 129 mmol/L — AB (ref 135–145)
Total Bilirubin: 0.7 mg/dL (ref 0.3–1.2)
Total Protein: 7.4 g/dL (ref 6.5–8.1)

## 2017-01-28 LAB — CBC
HCT: 40.4 % (ref 35.0–47.0)
Hemoglobin: 13.5 g/dL (ref 12.0–16.0)
MCH: 29.1 pg (ref 26.0–34.0)
MCHC: 33.4 g/dL (ref 32.0–36.0)
MCV: 87 fL (ref 80.0–100.0)
PLATELETS: 329 10*3/uL (ref 150–440)
RBC: 4.64 MIL/uL (ref 3.80–5.20)
RDW: 13.5 % (ref 11.5–14.5)
WBC: 12.5 10*3/uL — ABNORMAL HIGH (ref 3.6–11.0)

## 2017-01-28 LAB — INFLUENZA PANEL BY PCR (TYPE A & B)
INFLAPCR: NEGATIVE
Influenza B By PCR: NEGATIVE

## 2017-01-28 LAB — LIPASE, BLOOD: Lipase: 28 U/L (ref 11–51)

## 2017-01-28 LAB — TROPONIN I

## 2017-01-28 MED ORDER — PENTAFLUOROPROP-TETRAFLUOROETH EX AERO
INHALATION_SPRAY | CUTANEOUS | Status: AC
  Filled 2017-01-28: qty 30

## 2017-01-28 MED ORDER — MECLIZINE HCL 25 MG PO TABS: 25.0000 mg | ORAL_TABLET | Freq: Three times a day (TID) | ORAL | 0 refills | Status: DC | PRN

## 2017-01-28 MED ORDER — SODIUM CHLORIDE 0.9 % IV BOLUS (SEPSIS)
1000.0000 mL | Freq: Once | INTRAVENOUS | Status: AC
  Administered 2017-01-28: 1000 mL via INTRAVENOUS

## 2017-01-28 MED ORDER — ONDANSETRON HCL 4 MG PO TABS: 4.0000 mg | ORAL_TABLET | Freq: Every day | ORAL | 0 refills | Status: DC | PRN

## 2017-01-28 MED ORDER — MECLIZINE HCL 25 MG PO TABS
25.0000 mg | ORAL_TABLET | Freq: Once | ORAL | Status: AC
  Administered 2017-01-28: 25 mg via ORAL
  Filled 2017-01-28: qty 1

## 2017-01-28 MED ORDER — FENTANYL CITRATE (PF) 100 MCG/2ML IJ SOLN
25.0000 ug | Freq: Once | INTRAMUSCULAR | Status: AC
  Administered 2017-01-28: 25 ug via INTRAVENOUS
  Filled 2017-01-28: qty 2

## 2017-01-28 MED ORDER — ONDANSETRON HCL 4 MG/2ML IJ SOLN
4.0000 mg | Freq: Once | INTRAMUSCULAR | Status: AC
  Administered 2017-01-28: 4 mg via INTRAVENOUS
  Filled 2017-01-28: qty 2

## 2017-01-28 NOTE — Telephone Encounter (Signed)
Patient Name: [name] DOB: Jul 15, 1941 Initial Comment Caller is having some difficulty, has been having frequent urination, dizzy, sick to her stomach. Nurse Assessment Nurse: Markus Daft, RN, Sherre Poot Date/Time Eilene Ghazi Time): 01/28/2017 2:36:21 PM Confirm and document reason for call. If symptomatic, describe symptoms. ---Caller is has been having frequent urination, dizzy, nauseated. Took 2 Doan's pills this AM d/t right lower back spasms, and within the hr this AM she was feeling this way. Denies pain with urination. Fasting blood sugar yesterday 115, and then 90 this AM. Does the patient have any new or worsening symptoms? ---Yes Will a triage be completed? ---Yes Related visit to physician within the last 2 weeks? ---No Does the PT have any chronic conditions? (i.e. diabetes, asthma, etc.) ---Yes List chronic conditions. ---Diabetic; uterus prolapsed corrected in November 2017 Is this a behavioral health or substance abuse call? ---No Guidelines Guideline Title Affirmed Question Affirmed Notes Urinary Symptoms Patient sounds very sick or weak to the triager Final Disposition User Go to ED Now (or PCP triage) Markus Daft, RN, Sherre Poot Comments Dizziness is causing her to have to hold on to things in order to walk. Referrals [facility] - ED Disagree/Comply: Comply

## 2017-01-28 NOTE — ED Notes (Signed)
Pt assisted to restroom with assistance from this tech as well as family (daughter)

## 2017-01-28 NOTE — ED Triage Notes (Signed)
Patient presents to ED via POV with c/o right flank pain and frequent urination. Denies dysuria. A&O x4.

## 2017-01-28 NOTE — ED Provider Notes (Signed)
Patient received in sign-out from Dr. Dineen Kid.  Workup and evaluation pending additional monitoring and reassessment. Patient was given meclizine with significant improvement in her symptoms. Her history and exam appears consistent with component of her vertigo. She is hemodynamic stable and tolerating oral hydration. Do feel patient is stable for close outpatient follow-up.Marland Kitchen      Merlyn Lot, MD 01/28/17 2117

## 2017-01-28 NOTE — ED Notes (Addendum)
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 Janalyn Harder, EDT to try and start IV.

## 2017-01-28 NOTE — ED Provider Notes (Signed)
[name] Provider Note  ____________________________________________   First MD Initiated Contact with Patient 01/28/17 1624     (approximate)  I have reviewed the triage vital signs and the nursing notes.   HISTORY  Chief Complaint Flank Pain   HPI [name] is a 76 y.o. female with a history of diabetes as well as remote history of breast cancer was presenting with 1 day of right flank pain which she describes as a spasm type pain. She denies any rash. Denies any fever. Says that she has vomited twice already today but denies any diarrhea. Denies any shortness of breath or chest pain. Says that the pain worsens with movement. Says that she was doing some work on It several days ago and thinks that she may have positioned herself awkwardly but otherwise does not report any injury or straining.Urinary frequency but without any burning.     Past Medical History:  Diagnosis Date  . Arthritis   . Diabetes mellitus   . GERD (gastroesophageal reflux disease)   . Hypertension   . Lymph edema    Rt arm  . Neuropathy ([place])   . Osteoporosis   . Thyroid disease     Patient Active Problem List   Diagnosis Date Noted  . Pelvic organ prolapse quantification stage 1 cystocele 12/07/2016  . Urinary frequency 12/07/2016  . Vaginal atrophy 12/07/2016  . 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 Stress incontinence 09/24/2016  . External hemorrhoid 03/24/2016  . Hypothyroidism 03/24/2016  . Fatigue 10/21/2015  . Poor appetite 10/21/2015  . HLD (hyperlipidemia) 04/28/2015  . Osteopenia 04/28/2015  . 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 HIATAL HERNIA 02/10/2010  . ALLERGY, ENVIRONMENTAL 04/29/2009  . Malignant neoplasm of female breast ([place]) 04/26/2007  . Diabetes ([place]) 04/26/2007  . Essential hypertension 04/26/2007    Past Surgical History:  Procedure Laterality Date  . ANTERIOR AND POSTERIOR REPAIR N/A 10/18/2016   Procedure: ANTERIOR (CYSTOCELE) AND POSTERIOR REPAIR (RECTOCELE);  Surgeon: Rubie Maid, MD;  Location: [facility] ORS;  Service: Gynecology;  Laterality: N/A;  . APPENDECTOMY    . BACK SURGERY  1983   discectomy  . cartoid u/s  9/48/54   62-70% LICA stenosis  . COLONOSCOPY    . DOPPLER ECHOCARDIOGRAPHY  02/24/06   Triv MR, EF 55-60%  . [place]   bilateral  . HEMORROIDECTOMY    . MASTECTOMY     right, modified radical+ chemo, 2/23 nodes 4/03  . miscarriage  1970  . mumps  1965   h/o  . OTHER SURGICAL HISTORY  04/03/02   dexa, wnl  . OTHER SURGICAL HISTORY  03/12/04   dexa, wnl  . OTHER SURGICAL HISTORY  08/12/05   dexa-osteopor Hip, osteopen spine  . OTHER SURGICAL HISTORY  10/25/07   dexa- nml hip and spine  . SEPTOPLASTY  1970  . TONSILLECTOMY  1945  . VAGINAL DELIVERY     x5  . VAGINAL HYSTERECTOMY  1974   partial, fibroids  . VENTRAL HERNIA REPAIR      Prior to Admission medications   Medication Sig Start Date End Date Taking? Authorizing Provider  ACCU-CHEK AVIVA PLUS test strip USE 1 STRIP TWICE A DAY TO CHECK BLOOD SUGAR 10/26/16   Lucille Passy, MD  acetaminophen (TYLENOL 8 HOUR) 650 MG CR tablet Take 1 tablet (650 mg total) by mouth every 8 (eight) hours as needed for pain. 10/19/16   Rubie Maid, MD  acetaminophen (TYLENOL ARTHRITIS PAIN) 650 MG CR tablet Take 1,300 mg by mouth every 8 (eight) hours as needed for pain.    Historical Provider, MD  aspirin 81 MG tablet Take 81 mg by mouth at bedtime.     Historical Provider, MD  BD PEN NEEDLE NANO U/F 32G X 4 MM MISC USE 1 PEN NEEDLE DAILY 08/02/16   Lucille Passy, MD  Blood Glucose Monitoring Suppl (ACCU-CHEK AVIVA PLUS) W/DEVICE KIT Use to check blood sugar twice daily     Historical Provider, MD  clobetasol ointment (TEMOVATE) 0.05 % Apply nightly to irritated area x 2 weeks. 08/30/13   Lucille Passy, MD  cyanocobalamin (,VITAMIN B-12,) 1000 MCG/ML injection INJECT 1 ML ONCE A MONTH 11/13/15   Jearld Fenton,  NP  diphenhydramine-acetaminophen (TYLENOL PM) 25-500 MG TABS Take 0.5 tablets by mouth at bedtime as needed (sleep).     Historical Provider, MD  docusate sodium (COLACE) 100 MG capsule Take 1 capsule (100 mg total) by mouth 2 (two) times daily as needed for mild constipation. 10/19/16   Rubie Maid, MD  ezetimibe (ZETIA) 10 MG tablet TAKE ONE (1) TABLET EACH DAY Patient taking differently: TAKE ONE (1) TABLET each pm 05/11/16   Lucille Passy, MD  FLUARIX QUADRIVALENT 0.5 ML injection  09/30/16   Historical Provider, MD  fluticasone (FLONASE) 50 MCG/ACT nasal spray Place 2 sprays into both nostrils daily. Patient taking differently: Place 2 sprays into both nostrils daily as needed for allergies or rhinitis.  01/29/15   Jearld Fenton, NP  Lancets (ACCU-CHEK MULTICLIX) lancets Use as instructed to check blood sugar twice daily E11.9 06/28/16   Lucille Passy, MD  LANTUS SOLOSTAR 100 UNIT/ML Solostar Pen INJECT 26 UNITS INTO THE SKIN AT BEDTIME. 12/21/16   Lucille Passy, MD  meclizine (ANTIVERT) 25 MG tablet Take 1 tablet (25 mg total) by mouth 3 (three) times daily as needed for dizziness. 01/28/17   Orbie Pyo, MD  metFORMIN (GLUCOPHAGE) 1000 MG tablet TAKE ONE (1) TABLET EACH DAY WITH LUNCH 01/03/17   Lucille Passy, MD  Multiple Vitamins-Minerals (CENTRUM SILVER PO) Take 1 tablet by mouth daily.      Historical Provider, MD  naproxen sodium (ANAPROX) 220 MG tablet Take 440 mg by mouth daily.    Historical Provider, MD  Omega-3 Fatty Acids (FISH OIL PO) Take 1 capsule by mouth daily.     Historical Provider, MD  omeprazole (PRILOSEC) 20 MG capsule TAKE ONE (1) CAPSULE EACH DAY 07/05/16   Lucille Passy, MD  ondansetron (ZOFRAN) 4 MG tablet Take 1 tablet (4 mg total) by mouth daily as needed. 01/28/17   Orbie Pyo, MD  OVER THE COUNTER MEDICATION Take 1 tablet by mouth daily. Immune System Support Supplement includes :  Echinacea Zinc Vitamin C    Historical Provider, MD  oxycodone  (OXY-IR) 5 MG capsule Take 1 capsule (5 mg total) by mouth every 4 (four) hours as needed. 10/19/16   Rubie Maid, MD  terconazole (TERAZOL 7) 0.4 % vaginal cream Place 1 applicator vaginally at bedtime. 04/28/15   Lucille Passy, MD  valsartan-hydrochlorothiazide (DIOVAN-HCT) 160-25 MG tablet TAKE ONE (1) TABLET EACH DAY 09/13/16   Lucille Passy, MD  VICTOZA 18 MG/3ML SOPN INJECT 1.8 MG INTO THE SKIN DAILY. 11/12/16   Lucille Passy, MD    Allergies Atorvastatin; Azithromycin; Ciprofloxacin; Hydrocodone-homatropine; Hydrocodone-homatropine; Rosiglitazone; and Rosiglitazone maleate  Family History  Problem Relation Age of Onset  .  Diabetes Mother   . Hypertension Mother   . Heart attack Mother   . Heart failure Father   . Hypertension Father   . Stroke Father   . Lung cancer Brother     hx of smoking  . Stroke Brother   . Heart attack Brother     PTCA MI x 2    Social History Social History  Substance Use Topics  . Smoking status: Never Smoker  . Smokeless tobacco: Never Used  . Alcohol use No    Review of [name] Constitutional: No fever/chills Eyes: No visual changes. ENT: No sore throat. Cardiovascular: Denies chest pain. Respiratory: Denies shortness of breath. Gastrointestinal: No abdominal pain. No diarrhea.  No constipation. Genitourinary: Negative for dysuria. Musculoskeletal: right flank pain.  Skin: Negative for rash. Neurological: Negative for headaches, focal weakness or numbness.  10-point ROS otherwise negative.  ____________________________________________   PHYSICAL EXAM:  VITAL SIGNS: ED Triage Vitals  Enc Vitals Group     BP 01/28/17 1544 (!) 194/94     Pulse Rate 01/28/17 1544 98     Resp 01/28/17 1544 17     Temp 01/28/17 1544 98.6 F (37 C)     Temp Source 01/28/17 1544 Oral     SpO2 01/28/17 1544 98 %     Weight 01/28/17 1544 184 lb (83.5 kg)     Height 01/28/17 1544 '5\' 5"'  (1.651 m)     Head Circumference --      Peak Flow --      Pain  Score 01/28/17 1545 4     Pain Loc --      Pain Edu? --      Excl. in [place]? --     Constitutional: Alert and oriented. Well appearing and in no acute distress. Eyes: Conjunctivae are normal. PERRL. EOMI. Head: Atraumatic. Nose: No congestion/rhinnorhea. Mouth/Throat: Mucous membranes are moist.   Neck: No stridor.   Cardiovascular: Normal rate, regular rhythm. Grossly normal heart sounds.   Respiratory: Normal respiratory effort.  No retractions. Lungs CTAB. Gastrointestinal: Soft and nontender. No distention.No CVA tenderness. Musculoskeletal: No lower extremity tenderness nor edema.  No joint effusions.  Mild right lower lumbar tenderness to palpation. No rash noted. Neurologic:  Normal speech and language. No gross focal neurologic deficits are appreciated. No gait instability. Skin:  Skin is warm, dry and intact. No rash noted. Psychiatric: Mood and affect are normal. Speech and behavior are normal.  ____________________________________________   LABS (all labs ordered are listed, but only abnormal results are displayed)  Labs Reviewed  COMPREHENSIVE METABOLIC PANEL - Abnormal; Notable for the following:       Result Value   Sodium 129 (*)    Chloride 92 (*)    Glucose, Bld 223 (*)    All other components within normal limits  CBC - Abnormal; Notable for the following:    WBC 12.5 (*)    All other components within normal limits  URINALYSIS, COMPLETE (UACMP) WITH MICROSCOPIC - Abnormal; Notable for the following:    Color, Urine YELLOW (*)    APPearance CLEAR (*)    Glucose, UA 50 (*)    Ketones, ur 5 (*)    Squamous Epithelial / LPF 0-5 (*)    All other components within normal limits  LIPASE, BLOOD  INFLUENZA PANEL BY PCR (TYPE A & B)  TROPONIN I   ____________________________________________  EKG  ED ECG REPORT I, Doran Stabler, the attending physician, personally viewed and interpreted this ECG.  Date: 01/28/2017  EKG Time: 1704  Rate: 88  Rhythm:  normal sinus rhythm with PVC times one  Axis: normal  Intervals:none  ST&T Change: No ST segment elevation or depression. No abnormal T-wave inversion.  ____________________________________________  RADIOLOGY   CT RENAL STONE STUDY (Final result)  Result time 01/28/17 18:56:52  Final result by Massie Kluver, MD (01/28/17 18:56:52)           Narrative:   CLINICAL DATA: Right flank pain and frequent urination.  EXAM: CT ABDOMEN AND PELVIS WITHOUT CONTRAST  TECHNIQUE: Multidetector CT imaging of the abdomen and pelvis was performed following the standard protocol without IV contrast.  COMPARISON: CT from 03/25/2013  FINDINGS: Lower chest: Top normal-sized cardiac chambers with coronary arteriosclerosis. No pericardial effusion. Minimal atelectasis in the right middle lobe and lingula. Small hiatal hernia.  Hepatobiliary: Cholelithiasis without secondary signs of acute cholecystitis. The gallbladder is distended but without wall thickening nor pericholecystic fluid. There is no biliary dilatation. No space-occupying mass of the unenhanced liver.  Pancreas: Fatty atrophy of the pancreas.  Spleen: No splenomegaly.  Adrenals/Urinary Tract: Normal bilateral adrenal glands. 13 mm cyst off the lower pole the right kidney. No nephrolithiasis nor hydro nephrosis. The bladder is physiologically distended without calculus or mass.  Stomach/Bowel: Contracted stomach. Normal small bowel rotation without dilatation or inflammation. Moderate colonic stool burden without bowel obstruction. Sigmoid and descending colonic diverticulosis without acute diverticulitis. Appendectomy.  Vascular/Lymphatic: Aortoiliac atherosclerosis. No aneurysm. No lymphadenopathy by CT size criteria.  Reproductive: Hysterectomy. No adnexal mass.  Other: No abdominal wall hernia or abnormality. No abdominopelvic ascites.  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 Minimal grade 1 anterolisthesis of L4 on L5. Mild bilateral lumbar facet arthropathy from L3 through S1.  IMPRESSION: 1. No obstructive uropathy. 13 mm simple appearing right lower pole renal cyst. 2. Uncomplicated cholelithiasis. 3. Colonic diverticulosis without acute diverticulitis. 4. Appendectomy and hysterectomy. 5. 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 Minimal anterolisthesis of L4 on L5. 6. Borderline cardiomegaly with coronary arteriosclerosis.   Electronically Signed By: Ashley Royalty M.D. On: 01/28/2017 18:56             ____________________________________________   PROCEDURES  Procedure(s) performed:   Procedures  Critical Care performed:   ____________________________________________   INITIAL IMPRESSION / ASSESSMENT AND PLAN / ED COURSE  Pertinent labs & imaging results that were available during my care of the patient were reviewed by me and considered in my medical decision making (see chart for details).   ----------------------------------------- 750 PM on 01/28/2017 ----------------------------------------- Patient says the pain is improved but says now that every time she opens her eyes she becomes dizzy and is feeling nauseous. I asked her exactly the sequence of events of her illness and she says that now she thinks that it was elected to dizziness that started first. She also reports sinus pressure to the left maxillary sinus and what she says maybe a sinus infection about 1 week ago. She is denying any ringing or roaring in her ears bilaterally. Her workup to this point is been largely reassuring. I discussed the findings of cholelithiasis with the patient. She is denying any abdominal pain at this time. I repalpated her right upper quadrant and it is nontender. We will try the patient with meclizine. Signed out to Dr. Quentin Cornwall.      ____________________________________________   FINAL CLINICAL IMPRESSION(S) / ED  DIAGNOSES  Final diagnoses:  Right flank pain  Vertigo      NEW  MEDICATIONS STARTED DURING THIS VISIT:  New Prescriptions   MECLIZINE (ANTIVERT) 25 MG TABLET    Take 1 tablet (25 mg total) by mouth 3 (three) times daily as needed for dizziness.   ONDANSETRON (ZOFRAN) 4 MG TABLET    Take 1 tablet (4 mg total) by mouth daily as needed.     Note:  This document was prepared using Dragon voice recognition software and may include unintentional dictation errors.    Orbie Pyo, MD 01/28/17 [phone]

## 2017-01-28 NOTE — Telephone Encounter (Signed)
Please call to check on pt. 

## 2017-01-31 NOTE — Telephone Encounter (Signed)
Spoke to pt who states she was seen in the ED and was told she has an inner ear infection. She is scheduled to have an ENT appt, but is waiting for her son in law to see if he can get it moved up. She will contact office back to schedule hospital f/u after seeing ENT

## 2017-02-01 ENCOUNTER — Telehealth: Payer: Self-pay

## 2017-02-01 NOTE — Telephone Encounter (Signed)
Please call pt- an elevated reading of 200 does not cause too much concern given that she is on prednisone and I would not yet adjust her medications.  How have her sugars been since?

## 2017-02-01 NOTE — Telephone Encounter (Addendum)
Pt left v/m; FBS today was 200; pt has been taking prednisone given by Dr Malon Kindle ENT for vertigo and pt wants to know if should increase lantus or metformin while on prednisone. Pt request cb. Last seen annual 03/24/16.

## 2017-02-02 NOTE — Telephone Encounter (Signed)
Good.  Keep checking her sugars and continue current dose of lantus.

## 2017-02-02 NOTE — Telephone Encounter (Signed)
Pt checked her sugar this morning and it was 146, she said she increased her lantus to 28units.

## 2017-02-07 ENCOUNTER — Other Ambulatory Visit: Payer: Self-pay | Admitting: Family Medicine

## 2017-02-08 ENCOUNTER — Encounter: Payer: Medicare Other | Admitting: Obstetrics and Gynecology

## 2017-02-22 ENCOUNTER — Telehealth: Payer: Self-pay | Admitting: *Deleted

## 2017-02-22 ENCOUNTER — Ambulatory Visit (INDEPENDENT_AMBULATORY_CARE_PROVIDER_SITE_OTHER): Payer: Medicare Other | Admitting: Obstetrics and Gynecology

## 2017-02-22 VITALS — BP 167/100 | HR 98 | Wt 184.7 lb

## 2017-02-22 DIAGNOSIS — R350 Frequency of micturition: Secondary | ICD-10-CM

## 2017-02-22 DIAGNOSIS — I10 Essential (primary) hypertension: Secondary | ICD-10-CM

## 2017-02-22 DIAGNOSIS — R600 Localized edema: Secondary | ICD-10-CM | POA: Diagnosis not present

## 2017-02-22 DIAGNOSIS — R5382 Chronic fatigue, unspecified: Secondary | ICD-10-CM

## 2017-02-22 LAB — POCT URINALYSIS DIPSTICK
BILIRUBIN UA: NEGATIVE
Glucose, UA: NEGATIVE
KETONES UA: NEGATIVE
Nitrite, UA: NEGATIVE
PH UA: 6
SPEC GRAV UA: 1.005
Urobilinogen, UA: 0.2

## 2017-02-22 MED ORDER — SULFAMETHOXAZOLE-TRIMETHOPRIM 800-160 MG PO TABS: 1.0000 | ORAL_TABLET | Freq: Two times a day (BID) | ORAL | 1 refills | Status: DC

## 2017-02-22 NOTE — Progress Notes (Signed)
    GYNECOLOGY PROGRESS NOTE  Subjective:    Patient ID: [name], female    DOB: 01/23/1941, 76 y.o.   MRN: [id number]  HPI  Patient is a 76 y.o. [id number] female who presents for complaints of increased urination (frequency) over the past several weeks.  Patient notes that she has had "a lot going on" over the past few months with her health including steroid injections, an inner ear problem that caused nausea/vomiting, still noting fatigue and weakness.  Patient notes that she over the past few weeks she has been having to get up 3-4 times per night to go the restroom, and has also been going more frequently during the day.  Also noting some "bladder spasms" over the past few days, for which she has started taking Pyridium.  Notes that she has had these in the past, but "it's been a long time".  Thinks she may have a UTI.  Does report increasing fluid intake as she was concerned regarding being dehydrated.   The following portions of the patient's history were reviewed and updated as appropriate: allergies, current medications, past family history, past medical history, past social history, past surgical history and problem list.  Review of Systems Pertinent items noted in HPI and remainder of comprehensive ROS otherwise negative.   Objective:   Blood pressure (!) 167/100, pulse 98, weight 184 lb 11.2 oz (83.8 kg). General appearance: alert and no distress Abdomen: soft, non-tender; bowel sounds normal; no masses,  no organomegaly, no suprapubic tenderness.  Back: no deformities, normal curvature, no CVA tenderness Pelvic: deferred Extremities: edema noted, +1 and pitting, no ulcers, gangrene or trophic changes and no redness or tenderness Neurologic: Grossly normal   Results for orders placed or performed in visit on 02/22/17  POCT urinalysis dipstick  Result Value Ref Range   Color, UA orange    Clarity, UA cloudy    Glucose, UA neg    Bilirubin, UA neg    Ketones, UA neg    Spec Grav, UA 1.005 1.003, 1.005, 1.010, 1.015, 1.020, 1.025, 1.030, 1.035   Blood, UA trace    pH, UA 6.0 5.0, 5.5, 6.0, 6.5, 7.0, 7.5, 8.0   Protein, UA trace    Urobilinogen, UA 0.2 0.2, 1.0   Nitrite, UA neg    Leukocytes, UA large (3+) (A) Negative     Assessment:   Urinary frequency Uncontrolled HTN Edema Fatigue  Plan:   - UA today difficult to interpret as patient has been on Pyridium for several days, but notable findings include 3 + leukocytes.  Will send for a culture to r/o UTI.  Discussed option of prophylactic treatment or waiting for culture to return first.  Patient desires prophylactic treatment.   - Discussed that patient's uncontrolled BP and edema could also be a cause of her increased urination as she is accumulating fluid.  Can also explain increase in nocturia due to postural changes at night.  Advised patient to contact her PCP as her BPs are usually never this high.  Reviewed patient's meds, she is already on a combo pill that includes a diuretic.  - Fatigue is currently being managed by PCP.  - To f/u if symptoms do not improve or worsen.     Rubie Maid, MD [facility]

## 2017-02-22 NOTE — Telephone Encounter (Signed)
Patient left a voicemail stating that she was just at Dr. Andreas Blower office her OB/GYN and her blood pressure was 170/100. Patient stated that Dr. Marcelline Mates told her that she has fluid in her ankles. Patient stated that she is on Diovan. Patient stated that she has an upcoming appointment with Dr. Deborra Medina in April, but wants to know what to do in the meantime?  Ph[facility]

## 2017-02-22 NOTE — Telephone Encounter (Signed)
Pt has not checked BP since at Dr Andreas Blower office. Ankles are swollen. Pt is not having SOB, CP,H/A or dizziness. Transferred pt to [facility]. FYI  To Dr Deborra Medina.

## 2017-02-24 NOTE — Patient Instructions (Addendum)
Urinary Frequency, Adult Urinary frequency means urinating more often than usual. People with urinary frequency urinate at least 8 times in 24 hours, even if they drink a normal amount of fluid. Although they urinate more often than normal, the total amount of urine produced in a day may be normal. Urinary frequency is also called pollakiuria. What are the causes? This condition may be caused by:  A urinary tract infection.  Obesity.  Bladder problems, such as bladder stones.  Caffeine or alcohol.  Eating food or drinking fluids that irritate the bladder. These include coffee, tea, soda, artificial sweeteners, citrus, tomato-based foods, and chocolate.  Certain medicines, such as medicines that help the body get rid of extra fluid (diuretics).  Muscle or nerve weakness.  Overactive bladder.  Chronic diabetes.  Interstitial cystitis.  In men, problems with the prostate, such as an enlarged prostate.  In women, pregnancy. In some cases, the cause may not be known. What increases the risk? This condition is more likely to develop in:  Women who have gone through menopause.  Men with prostate problems.  People with a disease or injury that affects the nerves or spinal cord.  People who have or have had a condition that affects the brain, such as a stroke. What are the signs or symptoms? Symptoms of this condition include:  Feeling an urgent need to urinate often. The stress and anxiety of needing to find a bathroom quickly can make this urge worse.  Urinating 8 or more times in 24 hours.  Urinating as often as every 1 to 2 hours. How is this diagnosed? This condition is diagnosed based on your symptoms, your medical history, and a physical exam. You may have tests, such as:  Blood tests.  Urine tests.  Imaging tests, such as X-rays or ultrasounds.  A bladder test.  A test of your neurological system. This is the body system that senses the need to urinate.  A  test to check for problems in the urethra and bladder called cystoscopy. You may also be asked to keep a bladder diary. A bladder diary is a record of what you eat and drink, how often you urinate, and how much you urinate. You may need to see a health care provider who specializes in conditions of the urinary tract (urologist) or kidneys (nephrologist). How is this treated? Treatment for this condition depends on the cause. Sometimes the condition goes away on its own and treatment is not necessary. If treatment is needed, it may include:  Taking medicine.  Learning exercises that strengthen the muscles that help control urination.  Following a bladder training program. This may include:  Learning to delay going to the bathroom.  Double urinating (voiding). This helps if you are not completely emptying your bladder.  Scheduled voiding.  Making diet changes, such as:  Avoiding caffeine.  Drinking fewer fluids, especially alcohol.  Not drinking in the evening.  Not having foods or drinks that may irritate the bladder.  Eating foods that help prevent or ease constipation. Constipation can make this condition worse.  Having the nerves in your bladder stimulated. There are two options for stimulating the nerves to your bladder:  Outpatient electrical nerve stimulation. This is done by your health care provider.  Surgery to implant a bladder pacemaker. The pacemaker helps to control the urge to urinate. Follow these instructions at home:  Keep a bladder diary if told to by your health care provider.  Take over-the-counter and prescription medicines only as   told by your health care provider.  Do any exercises as told by your health care provider.  Follow a bladder training program as told by your health care provider.  Make any recommended diet changes.  Keep all follow-up visits as told by your health care provider. This is important. Contact a health care provider  if:  You start urinating more often.  You feel pain or irritation when you urinate.  You notice blood in your urine.  Your urine looks cloudy.  You develop a fever.  You begin vomiting. Get help right away if:  You are unable to urinate. This information is not intended to replace advice given to you by your health care provider. Make sure you discuss any questions you have with your health care provider. Document Released: 09/25/2009 Document Revised: 12/31/2015 Document Reviewed: 06/25/2015 Elsevier Interactive Patient Education  2017 [employer]  

## 2017-02-25 LAB — URINE CULTURE

## 2017-03-01 ENCOUNTER — Other Ambulatory Visit: Payer: Self-pay | Admitting: *Deleted

## 2017-03-01 ENCOUNTER — Telehealth: Payer: Self-pay

## 2017-03-01 MED ORDER — EZETIMIBE 10 MG PO TABS: ORAL_TABLET | ORAL | 0 refills | Status: DC

## 2017-03-01 NOTE — Telephone Encounter (Signed)
Called pt advised her of information below, pt states that she is still experiencing urinary frequency, advised pt to schedule f/u appt, pt transferred to receptionist to schedule.

## 2017-03-01 NOTE — Telephone Encounter (Signed)
-----   Message from Rubie Maid, MD sent at 02/28/2017  9:25 AM EDT ----- Please inform patient that she did have a small amount of bacteria in her urine.  The current antibiotic regimen she's on is suitable for treatment.

## 2017-03-01 NOTE — Addendum Note (Signed)
Addended by: Modena Nunnery on: 03/01/2017 10:09 AM   Modules accepted: Orders

## 2017-03-01 NOTE — Telephone Encounter (Signed)
Please close encounter if completed

## 2017-03-02 ENCOUNTER — Ambulatory Visit (INDEPENDENT_AMBULATORY_CARE_PROVIDER_SITE_OTHER): Payer: Medicare Other | Admitting: Obstetrics and Gynecology

## 2017-03-02 VITALS — BP 161/72 | HR 107 | Ht 62.5 in | Wt 184.5 lb

## 2017-03-02 DIAGNOSIS — R350 Frequency of micturition: Secondary | ICD-10-CM | POA: Diagnosis not present

## 2017-03-02 DIAGNOSIS — N8110 Cystocele, unspecified: Secondary | ICD-10-CM

## 2017-03-02 DIAGNOSIS — Q525 Fusion of labia: Secondary | ICD-10-CM | POA: Diagnosis not present

## 2017-03-02 DIAGNOSIS — R600 Localized edema: Secondary | ICD-10-CM | POA: Diagnosis not present

## 2017-03-02 DIAGNOSIS — Z87440 Personal history of urinary (tract) infections: Secondary | ICD-10-CM

## 2017-03-02 DIAGNOSIS — R351 Nocturia: Secondary | ICD-10-CM

## 2017-03-02 DIAGNOSIS — I10 Essential (primary) hypertension: Secondary | ICD-10-CM | POA: Diagnosis not present

## 2017-03-02 DIAGNOSIS — R3914 Feeling of incomplete bladder emptying: Secondary | ICD-10-CM | POA: Diagnosis not present

## 2017-03-02 NOTE — Progress Notes (Signed)
    GYNECOLOGY PROGRESS NOTE  Subjective:    Patient ID: DAQUANA PADDOCK, female    DOB: Apr 23, 1941, 76 y.o.   MRN: [id number]  HPI  Patient is a 76 y.o. [id number] female who presents for  Complaints of urinary retention. Patient notes that she took recently prescribed antibiotics for suspected urinary tract infection, which did help with her symptoms of bladder spasms. Patient still noting leakage of urine and increased frequency of urination at night going to the bathroom almost every 1-2 hours. Now notes that after she goes to the restroom during the day, that she feels like she has not completely emptied her bladder as she only voids small amounts, and feels the need to have to go again after approximately 45 minutes to an hour.  Patient also states that she has not been able to cut get in contact with her PCP regarding her elevated blood pressures and leg swelling.  The following portions of the patient's history were reviewed and updated as appropriate: allergies, current medications, past family history, past medical history, past social history, past surgical history and problem list.  Review of Systems Pertinent items noted in HPI and remainder of comprehensive ROS otherwise negative.   Objective:   Blood pressure (!) 161/72, pulse (!) 107, height 5' 2.5" (1.588 m), weight 184 lb 8 oz (83.7 kg). General appearance: alert and no distress. Abdomen: soft, non-tender; bowel sounds normal; no masses,  no organomegaly Pelvic: external genitalia with agglutination noted of upper labia minora from below clitoris to midurethral line.  Mild erythema noted at base of labia minora. Rectovaginal septum normal.  Vagina without discharge. Cystocele present (Grade 2).   Adnexae non-palpable, nontender bilaterally. Bimanual exam deferred.  Extremities: edema pitting edema up to midcalves, 1 to +2,  and atraumatic Neurologic: Grossly normal   Assessment:   Urinary frequency Nocturia Recent  UTI Cystocele Incomplete bladder emptying Uncontrolled HTN Labial agglutination Edema of lower extremities  Plan:   - Patient attempted to void today, approximately 50 cc measured.  Patient underwent Self-catheterization in clinic with 100 cc post-void residual.   - Urinary frequency after treatment of urinary tract infection, along with history of bladder spasms is suspicious of overactive bladder.  Discussed options for treatment including medications. Patient desires to be seen by PCP first before initiating medication. - Nocturia likely secondary to uncontrolled HTN and new onset of lower extremity edema over the past few weeks as symptoms recently worsened during this time as well. Patient will be seen by PCP tomorrow (our office was able to contact her PCP to have an appointment scheduled). - Cystocele has been present for some time, is not a new finding. However may also be contributing to some of the patient's new or symptoms. Discussed possibility of pessary placement after being evaluated by PCP. Patient desires to think this over. Not currently advanced enough to require surgical intervention. - Labial agglutination present, also with mild erythema of the base of the labia minora. Patient notes that she had been having some irritation and so was using hydrocortisone cream in the area. Advised patient to discontinue use of steroid cream, will give samples of estrogen cream to help with agglutination and vaginal irritation. To apply cream externally daily. - Patient to follow-up after being seen by PCP in the next several weeks to discuss previously mentioned options if no improvement in symptoms after edema has resolved.   Rubie Maid, MD [facility]

## 2017-03-03 ENCOUNTER — Ambulatory Visit (INDEPENDENT_AMBULATORY_CARE_PROVIDER_SITE_OTHER): Payer: Medicare Other | Admitting: Family Medicine

## 2017-03-03 ENCOUNTER — Encounter: Payer: Self-pay | Admitting: Family Medicine

## 2017-03-03 VITALS — BP 138/80 | HR 88 | Temp 98.1°F | Ht 62.5 in | Wt 185.0 lb

## 2017-03-03 DIAGNOSIS — I10 Essential (primary) hypertension: Secondary | ICD-10-CM | POA: Diagnosis not present

## 2017-03-03 NOTE — Progress Notes (Signed)
Subjective:   Patient ID: [name], female    DOB: [birth year], 76 y.o.   MRN: [id number]  [name] is a pleasant 76 y.o. year old female who presents to clinic today with Hypertension  on 03/03/2017  HPI:  Currently taking Diovan HCTZ 160-25 mg daily.  BP has been elevated at GYN at ortho but had just had surgery and or steroid injections when those were measured.  Denies any HA, blurred vision, CP or SOB.  Has had some intermittent LE edema but this is nothing new for her.  Has been through a lot of medical issues over the past several months and feels overwhelmed. Lab Results  Component Value Date   CREATININE 0.74 01/28/2017   BP Readings from Last 3 Encounters:  03/03/17 138/80  03/02/17 (!) 161/72  02/22/17 (!) 167/100   Current Outpatient Prescriptions on File Prior to Visit  Medication Sig Dispense Refill  . ACCU-CHEK AVIVA PLUS test strip USE 1 STRIP TWICE A DAY TO CHECK BLOOD SUGAR 100 each 2  . acetaminophen (TYLENOL 8 HOUR) 650 MG CR tablet Take 1 tablet (650 mg total) by mouth every 8 (eight) hours as needed for pain. 30 tablet 1  . acetaminophen (TYLENOL ARTHRITIS PAIN) 650 MG CR tablet Take 1,300 mg by mouth every 8 (eight) hours as needed for pain.    Marland Kitchen aspirin 81 MG tablet Take 81 mg by mouth at bedtime.     . BD PEN NEEDLE NANO U/F 32G X 4 MM MISC USE 1 PEN NEEDLE DAILY 100 each 2  . Blood Glucose Monitoring Suppl (ACCU-CHEK AVIVA PLUS) W/DEVICE KIT Use to check blood sugar twice daily     . clobetasol ointment (TEMOVATE) 0.05 % Apply nightly to irritated area x 2 weeks. 30 g 0  . cyanocobalamin (,VITAMIN B-12,) 1000 MCG/ML injection INJECT 1 ML ONCE A MONTH 1 mL 5  . diphenhydramine-acetaminophen (TYLENOL PM) 25-500 MG TABS Take 0.5 tablets by mouth at bedtime as needed (sleep).     . docusate sodium (COLACE) 100 MG capsule Take 1 capsule (100 mg total) by mouth 2 (two) times daily as needed for mild constipation. 60 capsule 2  . ezetimibe (ZETIA)  10 MG tablet TAKE ONE (1) TABLET EACH DAY 30 tablet 0  . FLUARIX QUADRIVALENT 0.5 ML injection     . fluticasone (FLONASE) 50 MCG/ACT nasal spray Place 2 sprays into both nostrils daily. (Patient taking differently: Place 2 sprays into both nostrils daily as needed for allergies or rhinitis. ) 16 g 6  . Lancets (ACCU-CHEK MULTICLIX) lancets Use as instructed to check blood sugar twice daily E11.9 102 each 3  . LANTUS SOLOSTAR 100 UNIT/ML Solostar Pen INJECT 26 UNITS INTO THE SKIN AT BEDTIME. 6 mL 5  . meclizine (ANTIVERT) 25 MG tablet Take 1 tablet (25 mg total) by mouth 3 (three) times daily as needed for dizziness. 30 tablet 0  . metFORMIN (GLUCOPHAGE) 1000 MG tablet TAKE ONE (1) TABLET EACH DAY WITH LUNCH 90 tablet 0  . Multiple Vitamins-Minerals (CENTRUM SILVER PO) Take 1 tablet by mouth daily.      . naproxen sodium (ANAPROX) 220 MG tablet Take 440 mg by mouth daily.    . Omega-3 Fatty Acids (FISH OIL PO) Take 1 capsule by mouth daily.     Marland Kitchen omeprazole (PRILOSEC) 20 MG capsule TAKE ONE (1) CAPSULE EACH DAY 90 capsule 3  . ondansetron (ZOFRAN) 4 MG tablet Take 1 tablet (4 mg total) by mouth  daily as needed. 10 tablet 0  . OVER THE COUNTER MEDICATION Take 1 tablet by mouth daily. Immune System Support Supplement includes :  Echinacea Zinc Vitamin C    . oxycodone (OXY-IR) 5 MG capsule Take 1 capsule (5 mg total) by mouth every 4 (four) hours as needed. 30 capsule 0  . phenazopyridine (PYRIDIUM) 200 MG tablet Take 200 mg by mouth 3 (three) times daily as needed for pain.    Marland Kitchen terconazole (TERAZOL 7) 0.4 % vaginal cream Place 1 applicator vaginally at bedtime. 45 g 0  . valsartan-hydrochlorothiazide (DIOVAN-HCT) 160-25 MG tablet TAKE ONE (1) TABLET EACH DAY 90 tablet 1  . VICTOZA 18 MG/3ML SOPN INJECT 1.8 MG INTO THE SKIN DAILY. 9 mL 2   No current facility-administered medications on file prior to visit.     Allergies  Allergen Reactions  . Atorvastatin Swelling    Leg swelling Other  reaction(s): Unknown Leg swelling Leg swelling  . Azithromycin Diarrhea and Nausea And Vomiting    Other reaction(s): Nausea And Vomiting  . Ciprofloxacin Other (See Comments)    Does not remember Other reaction(s): Unknown tendonitis tendonitis  . Hydrocodone-Homatropine     REACTION: DROVE HER UP THE WALL  . Hydrocodone-Homatropine     Other reaction(s): Other (See Comments) Drowsiness Other reaction(s): Other (See Comments) REACTION: DROVE HER UP THE WALL  . Rosiglitazone Swelling    Other reaction(s): Unknown REACTION: SWELLING  . Rosiglitazone Maleate     REACTION: SWELLING    Past Medical History:  Diagnosis Date  . Arthritis   . Diabetes mellitus   . GERD (gastroesophageal reflux disease)   . Hypertension   . Lymph edema    Rt arm  . Neuropathy ([place])   . Osteoporosis   . Thyroid disease     Past Surgical History:  Procedure Laterality Date  . ANTERIOR AND POSTERIOR REPAIR N/A 10/18/2016   Procedure: ANTERIOR (CYSTOCELE) AND POSTERIOR REPAIR (RECTOCELE);  Surgeon: Rubie Maid, MD;  Location: [facility] ORS;  Service: Gynecology;  Laterality: N/A;  . APPENDECTOMY    . BACK SURGERY  1983   discectomy  . cartoid u/s  1/85/63   14-97% LICA stenosis  . COLONOSCOPY    . DOPPLER ECHOCARDIOGRAPHY  02/24/06   Triv MR, EF 55-60%  . [place]   bilateral  . HEMORROIDECTOMY    . MASTECTOMY     right, modified radical+ chemo, 2/23 nodes 4/03  . miscarriage  1970  . mumps  1965   h/o  . OTHER SURGICAL HISTORY  04/03/02   dexa, wnl  . OTHER SURGICAL HISTORY  03/12/04   dexa, wnl  . OTHER SURGICAL HISTORY  08/12/05   dexa-osteopor Hip, osteopen spine  . OTHER SURGICAL HISTORY  10/25/07   dexa- nml hip and spine  . SEPTOPLASTY  1970  . TONSILLECTOMY  1945  . VAGINAL DELIVERY     x5  . VAGINAL HYSTERECTOMY  1974   partial, fibroids  . VENTRAL HERNIA REPAIR      Family History  Problem Relation Age of Onset  . Diabetes Mother   . Hypertension  Mother   . Heart attack Mother   . Heart failure Father   . Hypertension Father   . Stroke Father   . Lung cancer Brother     hx of smoking  . Stroke Brother   . Heart attack Brother     PTCA MI x 2    Social History  Social History  . Marital status: Married    Spouse name: N/A  . Number of children: 5  . Years of education: N/A   Occupational History  . Retired Pharmacist, hospital Retired   Social History Main Topics  . Smoking status: Never Smoker  . Smokeless tobacco: Never Used  . Alcohol use No  . Drug use: No  . Sexual activity: Not Currently    Birth control/ protection: Surgical   Other Topics Concern  . Not on file   Social History Narrative   Married 50th anniversary (8/08)   Has a Living Will- all of her children have a copy of it.   Desires CPR but would not want prolonged life support.               The PMH, PSH, Social History, Family History, Medications, and allergies have been reviewed in [facility], and have been updated if relevant.   Review of Systems  Constitutional: Negative.   Respiratory: Negative for choking, chest tightness and shortness of breath.   Cardiovascular: Negative.   Psychiatric/Behavioral: Negative.   All other systems reviewed and are negative.      Objective:    BP 138/80   Pulse 88   Temp 98.1 F (36.7 C)   Ht 5' 2.5" (1.588 m)   Wt 185 lb (83.9 kg)   SpO2 98%   BMI 33.30 kg/m    Physical Exam  Constitutional: She is oriented to person, place, and time. She appears well-developed and well-nourished. No distress.  HENT:  Head: Normocephalic and atraumatic.  Eyes: Conjunctivae are normal.  Cardiovascular: Normal rate.   Pulmonary/Chest: Effort normal.  Neurological: She is alert and oriented to person, place, and time. No cranial nerve deficit.  Skin: Skin is warm and dry. She is not diaphoretic.  Psychiatric: She has a normal mood and affect. Her behavior is normal. Judgment and thought content normal.  Nursing note  and vitals reviewed.         Assessment & Plan:   Essential hypertension No Follow-up on file.

## 2017-03-03 NOTE — Assessment & Plan Note (Signed)
Reassurance provided and no changes made to rxs today. Normotensive here x 2. I suspect anxiety and steroids were elevating her BP. She does have BP machine at home.  Advised to keep a log and call us if consisently greater than 150/90.  She has CPX with me scheduled in a few weeks. The patient indicates understanding of these issues and agrees with the plan.

## 2017-03-07 ENCOUNTER — Other Ambulatory Visit: Payer: Self-pay | Admitting: Family Medicine

## 2017-03-07 DIAGNOSIS — E785 Hyperlipidemia, unspecified: Secondary | ICD-10-CM

## 2017-03-07 DIAGNOSIS — E039 Hypothyroidism, unspecified: Secondary | ICD-10-CM

## 2017-03-07 DIAGNOSIS — E119 Type 2 diabetes mellitus without complications: Secondary | ICD-10-CM

## 2017-03-09 NOTE — Telephone Encounter (Signed)
Please close encounter if completed

## 2017-03-10 ENCOUNTER — Other Ambulatory Visit: Payer: Self-pay | Admitting: Family Medicine

## 2017-03-10 NOTE — Telephone Encounter (Signed)
Last seen 03/03/17, last refill 11/12/16. Please advise

## 2017-03-15 ENCOUNTER — Other Ambulatory Visit: Payer: Self-pay | Admitting: Family Medicine

## 2017-03-24 ENCOUNTER — Other Ambulatory Visit (INDEPENDENT_AMBULATORY_CARE_PROVIDER_SITE_OTHER): Payer: Medicare Other

## 2017-03-24 DIAGNOSIS — E119 Type 2 diabetes mellitus without complications: Secondary | ICD-10-CM | POA: Diagnosis not present

## 2017-03-24 DIAGNOSIS — E785 Hyperlipidemia, unspecified: Secondary | ICD-10-CM | POA: Diagnosis not present

## 2017-03-24 DIAGNOSIS — E039 Hypothyroidism, unspecified: Secondary | ICD-10-CM

## 2017-03-24 LAB — CBC WITH DIFFERENTIAL/PLATELET
BASOS PCT: 0.8 % (ref 0.0–3.0)
Basophils Absolute: 0.1 10*3/uL (ref 0.0–0.1)
Eosinophils Absolute: 0.1 10*3/uL (ref 0.0–0.7)
Eosinophils Relative: 1.7 % (ref 0.0–5.0)
HCT: 37.2 % (ref 36.0–46.0)
Hemoglobin: 12.7 g/dL (ref 12.0–15.0)
LYMPHS PCT: 28.1 % (ref 12.0–46.0)
Lymphs Abs: 1.9 10*3/uL (ref 0.7–4.0)
MCHC: 34.2 g/dL (ref 30.0–36.0)
MCV: 87.6 fl (ref 78.0–100.0)
MONO ABS: 0.7 10*3/uL (ref 0.1–1.0)
Monocytes Relative: 9.7 % (ref 3.0–12.0)
NEUTROS PCT: 59.7 % (ref 43.0–77.0)
Neutro Abs: 4.1 10*3/uL (ref 1.4–7.7)
PLATELETS: 357 10*3/uL (ref 150.0–400.0)
RBC: 4.25 Mil/uL (ref 3.87–5.11)
RDW: 13.9 % (ref 11.5–15.5)
WBC: 6.9 10*3/uL (ref 4.0–10.5)

## 2017-03-24 LAB — COMPREHENSIVE METABOLIC PANEL
ALK PHOS: 44 U/L (ref 39–117)
ALT: 17 U/L (ref 0–35)
AST: 24 U/L (ref 0–37)
Albumin: 3.9 g/dL (ref 3.5–5.2)
BILIRUBIN TOTAL: 0.4 mg/dL (ref 0.2–1.2)
BUN: 12 mg/dL (ref 6–23)
CO2: 30 meq/L (ref 19–32)
Calcium: 9.3 mg/dL (ref 8.4–10.5)
Chloride: 96 mEq/L (ref 96–112)
Creatinine, Ser: 0.76 mg/dL (ref 0.40–1.20)
GFR: 78.26 mL/min (ref >60.00)
GLUCOSE: 99 mg/dL (ref 70–99)
POTASSIUM: 3.8 meq/L (ref 3.5–5.1)
SODIUM: 134 meq/L — AB (ref 135–145)
TOTAL PROTEIN: 6.9 g/dL (ref 6.0–8.3)

## 2017-03-24 LAB — HEMOGLOBIN A1C: HEMOGLOBIN A1C: 7 % — AB (ref 4.6–6.5)

## 2017-03-24 LAB — LIPID PANEL
CHOL/HDL RATIO: 4
Cholesterol: 177 mg/dL (ref 0–200)
HDL: 46.2 mg/dL (ref >39.00)
LDL Cholesterol: 111 mg/dL — ABNORMAL HIGH (ref 0–99)
NONHDL: 131.18
Triglycerides: 102 mg/dL (ref 0.0–149.0)
VLDL: 20.4 mg/dL (ref 0.0–40.0)

## 2017-03-24 LAB — TSH: TSH: 4.29 u[IU]/mL (ref 0.35–4.50)

## 2017-03-25 ENCOUNTER — Other Ambulatory Visit: Payer: Self-pay | Admitting: Family Medicine

## 2017-03-25 NOTE — Telephone Encounter (Signed)
[name] with [name] left v/m requesting status of zetia refill. Refilled per protocol.

## 2017-03-29 ENCOUNTER — Ambulatory Visit (INDEPENDENT_AMBULATORY_CARE_PROVIDER_SITE_OTHER): Payer: Medicare Other | Admitting: Family Medicine

## 2017-03-29 ENCOUNTER — Other Ambulatory Visit: Payer: Self-pay | Admitting: Family Medicine

## 2017-03-29 ENCOUNTER — Encounter: Payer: Self-pay | Admitting: Family Medicine

## 2017-03-29 VITALS — BP 136/70 | HR 96 | Temp 98.2°F | Ht 65.0 in | Wt 186.0 lb

## 2017-03-29 DIAGNOSIS — Z01419 Encounter for gynecological examination (general) (routine) without abnormal findings: Secondary | ICD-10-CM

## 2017-03-29 DIAGNOSIS — I10 Essential (primary) hypertension: Secondary | ICD-10-CM | POA: Diagnosis not present

## 2017-03-29 DIAGNOSIS — E119 Type 2 diabetes mellitus without complications: Secondary | ICD-10-CM | POA: Diagnosis not present

## 2017-03-29 DIAGNOSIS — R600 Localized edema: Secondary | ICD-10-CM | POA: Diagnosis not present

## 2017-03-29 DIAGNOSIS — E785 Hyperlipidemia, unspecified: Secondary | ICD-10-CM

## 2017-03-29 DIAGNOSIS — E039 Hypothyroidism, unspecified: Secondary | ICD-10-CM

## 2017-03-29 DIAGNOSIS — C50919 Malignant neoplasm of unspecified site of unspecified female breast: Secondary | ICD-10-CM | POA: Diagnosis not present

## 2017-03-29 DIAGNOSIS — Z171 Estrogen receptor negative status [ER-]: Secondary | ICD-10-CM

## 2017-03-29 LAB — BRAIN NATRIURETIC PEPTIDE: Pro B Natriuretic peptide (BNP): 39 pg/mL (ref 0.0–100.0)

## 2017-03-29 NOTE — Patient Instructions (Addendum)
Great to see you.  Please schedule your mammogram.  I will call you with your result.

## 2017-03-29 NOTE — Addendum Note (Signed)
Addended by: Lucille Passy on: 03/29/2017 12:40 PM   Modules accepted: Orders

## 2017-03-29 NOTE — Assessment & Plan Note (Signed)
Reviewed preventive care protocols, scheduled due services, and updated immunizations Discussed nutrition, exercise, diet, and healthy lifestyle.  

## 2017-03-29 NOTE — Progress Notes (Addendum)
Subjective:   Patient ID: [name], female    DOB: [birth year], 76 y.o.   MRN: [id number]  [name] is a pleasant 76 y.o. year old female who presents to clinic today with Annual Exam (Pt does not want a medicare wellness.)  on 03/29/2017  HPI:  76 yo female with h/o breast CA, DM here for CPX and follow up of chronic medical conditions.   Unfortunately, she been through a lot of medical issues over the past several months and feels overwhelmed.  Past few months, having some LE edema, left > right . No CP or SOB. It is better when she wakes up in the morning.  Remote h/o hysterectomy Prevnar 13 03/13/14 Pneumovax 12/28/11 Td 08/01/08 Zoster 07/04/12 Colonoscopy 04/22/10- Dr. Vira Agar, 10 year recall.  Mammogram 12/2015  Hypothyroidism- does feel fatigued but denies other symptoms of hypo or hyperthyroidism. Wt Readings from Last 3 Encounters:  03/29/17 186 lb (84.4 kg)  03/03/17 185 lb (83.9 kg)  03/02/17 184 lb 8 oz (83.7 kg)   Lab Results  Component Value Date   TSH 4.29 03/24/2017    DM- No longer seeing Dr. Cruzita Lederer    Her sugars have been stable after addition of Victoza and cutting portions and losing weight, and sugars improved more after increasing Victoza to 1.8.   Checks FSBS- averaging between around 88-94 fasting.  Brings her log in today.   Lab Results  Component Value Date   HGBA1C 7.0 (H) 03/24/2017   Wt Readings from Last 3 Encounters:  03/29/17 186 lb (84.4 kg)  03/03/17 185 lb (83.9 kg)  03/02/17 184 lb 8 oz (83.7 kg)    HLD- taking Zetia 10 mg daily. Lab Results  Component Value Date   CHOL 177 03/24/2017   HDL 46.20 03/24/2017   LDLCALC 111 (H) 03/24/2017   TRIG 102.0 03/24/2017   CHOLHDL 4 03/24/2017    BP- on Diovan- HCT. Well controlled today. Denies any HA, burred vision or LE edema. Lab Results  Component Value Date   CREATININE 0.76 03/24/2017   H/o breast cancer- over 15 years out.  Seeing Dr. Tristan Schroeder.    Mammogram UTD.  Current Outpatient Prescriptions on File Prior to Visit  Medication Sig Dispense Refill  . ACCU-CHEK AVIVA PLUS test strip USE 1 STRIP TWICE A DAY TO CHECK BLOOD SUGAR 100 each 2  . acetaminophen (TYLENOL 8 HOUR) 650 MG CR tablet Take 1 tablet (650 mg total) by mouth every 8 (eight) hours as needed for pain. 30 tablet 1  . aspirin 81 MG tablet Take 81 mg by mouth at bedtime.     . BD PEN NEEDLE NANO U/F 32G X 4 MM MISC USE 1 PEN NEEDLE DAILY 100 each 2  . Blood Glucose Monitoring Suppl (ACCU-CHEK AVIVA PLUS) W/DEVICE KIT Use to check blood sugar twice daily     . diphenhydramine-acetaminophen (TYLENOL PM) 25-500 MG TABS Take 0.5 tablets by mouth at bedtime as needed (sleep).     . docusate sodium (COLACE) 100 MG capsule Take 1 capsule (100 mg total) by mouth 2 (two) times daily as needed for mild constipation. 60 capsule 2  . ezetimibe (ZETIA) 10 MG tablet TAKE ONE (1) TABLET EACH DAY 30 tablet 5  . fluticasone (FLONASE) 50 MCG/ACT nasal spray Place 2 sprays into both nostrils daily. (Patient taking differently: Place 2 sprays into both nostrils daily as needed for allergies or rhinitis. ) 16 g 6  . Lancets (ACCU-CHEK MULTICLIX) lancets Use  as instructed to check blood sugar twice daily E11.9 102 each 3  . LANTUS SOLOSTAR 100 UNIT/ML Solostar Pen INJECT 26 UNITS INTO THE SKIN AT BEDTIME. 6 mL 5  . meclizine (ANTIVERT) 25 MG tablet Take 1 tablet (25 mg total) by mouth 3 (three) times daily as needed for dizziness. 30 tablet 0  . metFORMIN (GLUCOPHAGE) 1000 MG tablet TAKE ONE (1) TABLET EACH DAY WITH LUNCH 90 tablet 0  . Multiple Vitamins-Minerals (CENTRUM SILVER PO) Take 1 tablet by mouth daily.      . naproxen sodium (ANAPROX) 220 MG tablet Take 440 mg by mouth daily.    . Omega-3 Fatty Acids (FISH OIL PO) Take 1 capsule by mouth daily.     Marland Kitchen omeprazole (PRILOSEC) 20 MG capsule TAKE ONE (1) CAPSULE EACH DAY 90 capsule 3  . ondansetron (ZOFRAN) 4 MG tablet Take 1 tablet (4 mg  total) by mouth daily as needed. 10 tablet 0  . OVER THE COUNTER MEDICATION Take 1 tablet by mouth daily. Immune System Support Supplement includes :  Echinacea Zinc Vitamin C    . oxycodone (OXY-IR) 5 MG capsule Take 1 capsule (5 mg total) by mouth every 4 (four) hours as needed. 30 capsule 0  . phenazopyridine (PYRIDIUM) 200 MG tablet Take 200 mg by mouth 3 (three) times daily as needed for pain.    . valsartan-hydrochlorothiazide (DIOVAN-HCT) 160-25 MG tablet TAKE ONE (1) TABLET EACH DAY 90 tablet 3  . VICTOZA 18 MG/3ML SOPN INJECT 1.8 MG INTO THE SKIN DAILY. 1 pen 6  . cyanocobalamin (,VITAMIN B-12,) 1000 MCG/ML injection INJECT 1 ML ONCE A MONTH (Patient not taking: Reported on 03/29/2017) 1 mL 5   No current facility-administered medications on file prior to visit.     Allergies  Allergen Reactions  . Atorvastatin Swelling    Leg swelling Other reaction(s): Unknown Leg swelling Leg swelling  . Azithromycin Diarrhea and Nausea And Vomiting    Other reaction(s): Nausea And Vomiting  . Ciprofloxacin Other (See Comments)    Does not remember Other reaction(s): Unknown tendonitis tendonitis  . Hydrocodone-Homatropine     REACTION: DROVE HER UP THE WALL  . Hydrocodone-Homatropine     Other reaction(s): Other (See Comments) Drowsiness Other reaction(s): Other (See Comments) REACTION: DROVE HER UP THE WALL  . Rosiglitazone Swelling    Other reaction(s): Unknown REACTION: SWELLING  . Rosiglitazone Maleate     REACTION: SWELLING    Past Medical History:  Diagnosis Date  . Arthritis   . Diabetes mellitus   . GERD (gastroesophageal reflux disease)   . Hypertension   . Lymph edema    Rt arm  . Neuropathy   . Osteoporosis   . Thyroid disease     Past Surgical History:  Procedure Laterality Date  . ANTERIOR AND POSTERIOR REPAIR N/A 10/18/2016   Procedure: ANTERIOR (CYSTOCELE) AND POSTERIOR REPAIR (RECTOCELE);  Surgeon: Rubie Maid, MD;  Location: [facility] ORS;  Service:  Gynecology;  Laterality: N/A;  . APPENDECTOMY    . BACK SURGERY  1983   discectomy  . cartoid u/s  2/58/52   77-82% LICA stenosis  . COLONOSCOPY    . DOPPLER ECHOCARDIOGRAPHY  02/24/06   Triv MR, EF 55-60%  . [place]   bilateral  . HEMORROIDECTOMY    . MASTECTOMY     right, modified radical+ chemo, 2/23 nodes 4/03  . miscarriage  1970  . mumps  1965   h/o  . OTHER SURGICAL HISTORY  04/03/02   dexa, wnl  . OTHER SURGICAL HISTORY  03/12/04   dexa, wnl  . OTHER SURGICAL HISTORY  08/12/05   dexa-osteopor Hip, osteopen spine  . OTHER SURGICAL HISTORY  10/25/07   dexa- nml hip and spine  . SEPTOPLASTY  1970  . TONSILLECTOMY  1945  . VAGINAL DELIVERY     x5  . VAGINAL HYSTERECTOMY  1974   partial, fibroids  . VENTRAL HERNIA REPAIR      Family History  Problem Relation Age of Onset  . Diabetes Mother   . Hypertension Mother   . Heart attack Mother   . Heart failure Father   . Hypertension Father   . Stroke Father   . Lung cancer Brother     hx of smoking  . Stroke Brother   . Heart attack Brother     PTCA MI x 2    Social History   Social History  . Marital status: Married    Spouse name: N/A  . Number of children: 5  . Years of education: N/A   Occupational History  . Retired Pharmacist, hospital Retired   Social History Main Topics  . Smoking status: Never Smoker  . Smokeless tobacco: Never Used  . Alcohol use No  . Drug use: No  . Sexual activity: Not Currently    Birth control/ protection: Surgical   Other Topics Concern  . Not on file   Social History Narrative   Married 50th anniversary (8/08)   Has a Living Will- all of her children have a copy of it.   Desires CPR but would not want prolonged life support.               The PMH, PSH, Social History, Family History, Medications, and allergies have been reviewed in [facility], and have been updated if relevant.  Review of Systems  Constitutional: Positive for fatigue.  HENT: Negative.     Eyes: Negative.   Respiratory: Negative.   Cardiovascular: Positive for leg swelling.  Gastrointestinal: Negative.   Endocrine: Negative.   Genitourinary: Negative.   Musculoskeletal: Negative.   Allergic/Immunologic: Negative.   Neurological: Negative.   Hematological: Negative.   Psychiatric/Behavioral: Negative.   All other systems reviewed and are negative.      Objective:    BP 136/70 (BP Location: Left Arm, Patient Position: Sitting, Cuff Size: Large)   Pulse 96   Temp 98.2 F (36.8 C) (Oral)   Ht '5\' 5"'  (1.651 m)   Wt 186 lb (84.4 kg)   SpO2 97%   BMI 30.95 kg/m    Physical Exam   General:  Well-developed,well-nourished,in no acute distress; alert,appropriate and cooperative throughout examination Head:  normocephalic and atraumatic.   Eyes:  vision grossly intact, PERRL Ears:  R ear normal and L ear normal externally, TMs clear bilaterally Nose:  no external deformity.   Mouth:  good dentition.   Neck:  No deformities, masses, or tenderness noted. Lungs:  Normal respiratory effort, chest expands symmetrically. Lungs are clear to auscultation, no crackles or wheezes. Heart:  Normal rate and regular rhythm. S1 and S2 normal without gallop, murmur, click, rub or other extra sounds. Abdomen:  Bowel sounds positive,abdomen soft and non-tender without masses, organomegaly or hernias noted. Msk:  No deformity or scoliosis noted of thoracic or lumbar spine.   Extremities:  No clubbing, cyanosis, edema, or deformity noted with normal full range of motion of all joints.   +trace pitting edema right, 1+ pitting edema left Neurologic:  alert & oriented X3 and gait normal.   Skin:  Intact without suspicious lesions or rashes Cervical Nodes:  No lymphadenopathy noted Axillary Nodes:  No palpable lymphadenopathy Psych:  Cognition and judgment appear intact. Alert and cooperative with normal attention span and concentration. No apparent delusions, illusions,  hallucinations      Assessment & Plan:   Essential hypertension  Well woman exam  Type 2 diabetes mellitus without complication, unspecified whether long term insulin use (HCC)  Hypothyroidism, unspecified type  Hyperlipidemia, unspecified hyperlipidemia type  Malignant neoplasm of breast in female, estrogen receptor negative, unspecified laterality, unspecified site of breast ([place]) No Follow-up on file.

## 2017-03-29 NOTE — Assessment & Plan Note (Signed)
Good control. No changes made to rxs today.

## 2017-03-29 NOTE — Assessment & Plan Note (Signed)
Well controlled, euthyroid. No changes made to rxs today.

## 2017-03-29 NOTE — Assessment & Plan Note (Signed)
Check BNP today.  Most likely dependent edema.

## 2017-03-29 NOTE — Assessment & Plan Note (Addendum)
Continue zetia.  Reasonable control. No changes made to rxs today.

## 2017-04-04 ENCOUNTER — Other Ambulatory Visit: Payer: Self-pay | Admitting: Family Medicine

## 2017-04-06 ENCOUNTER — Ambulatory Visit (INDEPENDENT_AMBULATORY_CARE_PROVIDER_SITE_OTHER): Payer: Medicare Other | Admitting: Obstetrics and Gynecology

## 2017-04-06 VITALS — BP 130/76 | HR 99 | Ht 65.0 in | Wt 187.9 lb

## 2017-04-06 DIAGNOSIS — N952 Postmenopausal atrophic vaginitis: Secondary | ICD-10-CM | POA: Diagnosis not present

## 2017-04-06 DIAGNOSIS — I10 Essential (primary) hypertension: Secondary | ICD-10-CM

## 2017-04-06 DIAGNOSIS — N9089 Other specified noninflammatory disorders of vulva and perineum: Secondary | ICD-10-CM | POA: Diagnosis not present

## 2017-04-06 DIAGNOSIS — R339 Retention of urine, unspecified: Secondary | ICD-10-CM

## 2017-04-06 DIAGNOSIS — N8110 Cystocele, unspecified: Secondary | ICD-10-CM | POA: Diagnosis not present

## 2017-04-06 DIAGNOSIS — R600 Localized edema: Secondary | ICD-10-CM

## 2017-04-06 NOTE — Progress Notes (Signed)
    GYNECOLOGY PROGRESS NOTE  Subjective:    Patient ID: [name], female    DOB: [birth year], 76 y.o.   MRN: [id number]  HPI  Patient is a 76 y.o. [id number] female who presents for f/u of vaginal atrophy, labial agglutination, incomplete bladder emptying and nocturia.  Patient notes that she has been using the estrogen cream as prescribed and has noticed a difference in her urinary stream (is able to go more steadily, and notes a decrease in nocturia).   Of note, patient was seen by PCP due to elevated BPs and bilateral LE edema (as this was also thought to contribute to patient's urinary symptoms), but BPs were noted to be normal at visit.   The following portions of the patient's history were reviewed and updated as appropriate: allergies, current medications, past family history, past medical history, past social history, past surgical history and problem list.  Review of Systems Pertinent items noted in HPI and remainder of comprehensive ROS otherwise negative.   Objective:   Blood pressure 130/76, pulse 99, height 5\' 5"  (1.651 m), weight 187 lb 14.4 oz (85.2 kg). General appearance: alert and no distress Pelvic: external genitalia with moderately atrophic labia, improvement noted with agglutination as able to better visualize urethra and introitus.  Still noting some agglutination at base of hymen and near clitoral region.  Cystocele (Grade 2) present.  Internal exam not performed.  Extremities: extremities normal, atraumatic, no cyanosis.  Edema +1 pitting edema bilaterally up to shins   Assessment:   Labial agglutination Vaginal atrophy Nocturia Cystocele Incomplete bladder emptying HTN Edema of lower extremities  Plan:   - Patient to continue use of Premarin cream for labial agglutination and vaginal atrophy.  Discussed using for short term therapy (~ 3 months).  Advised that due to her h/o breast cancer, she can either do 3 months on, 3 months of therapy, or after 3  months can be switched to an alternative medication (I.e. Fulton Reek) which does not contain estrogen for maintenance therapy for vaginal atrophy.   - Nocturia and incomplete bladder emptying symptoms have been improving since initiation of estrogen cream.  To continue.  - HTN and edema of lower extremities.  BP better controlled today.  Notes that her meds were not adjusted by her PCP as her BP was noted to be normal there.  Also is getting a BP cuff to monitor BPs at home.  Continue compression stockings, and is on a diuretic.     Rubie Maid, MD [facility]

## 2017-04-07 MED ORDER — ESTROGENS, CONJUGATED 0.625 MG/GM VA CREA: 1.0000 | TOPICAL_CREAM | VAGINAL | 12 refills | Status: DC

## 2017-05-17 ENCOUNTER — Other Ambulatory Visit: Payer: Self-pay | Admitting: Family Medicine

## 2017-05-19 ENCOUNTER — Ambulatory Visit (INDEPENDENT_AMBULATORY_CARE_PROVIDER_SITE_OTHER): Payer: Medicare Other | Admitting: Obstetrics and Gynecology

## 2017-05-19 VITALS — BP 148/74 | HR 90 | Ht 65.0 in | Wt 188.0 lb

## 2017-05-19 DIAGNOSIS — N8110 Cystocele, unspecified: Secondary | ICD-10-CM

## 2017-05-19 DIAGNOSIS — N898 Other specified noninflammatory disorders of vagina: Secondary | ICD-10-CM | POA: Diagnosis not present

## 2017-05-19 DIAGNOSIS — N952 Postmenopausal atrophic vaginitis: Secondary | ICD-10-CM

## 2017-05-19 DIAGNOSIS — Z853 Personal history of malignant neoplasm of breast: Secondary | ICD-10-CM

## 2017-05-19 MED ORDER — PRASTERONE 6.5 MG VA INST: 1.0000 | VAGINAL_INSERT | Freq: Every day | VAGINAL | 11 refills | Status: DC

## 2017-05-19 MED ORDER — CLOBETASOL PROPIONATE 0.05 % EX OINT: TOPICAL_OINTMENT | CUTANEOUS | 5 refills | Status: DC

## 2017-05-19 NOTE — Progress Notes (Signed)
    GYNECOLOGY PROGRESS NOTE  Subjective:    Patient ID: [name], female    DOB: [birth year], 76 y.o.   MRN: [id number]  HPI  Patient is a 76 y.o. [id number] female who presents for f/u of vaginal atrophy, labial agglutination, incomplete bladder emptying and nocturia.  Patient notes that she has been using the estrogen cream as prescribed and for the most point has noticed marked improvement in her symptoms.  Is still noting some mild external vaginal irritation externally.   The following portions of the patient's history were reviewed and updated as appropriate: allergies, current medications, past family history, past medical history, past social history, past surgical history and problem list.  Review of Systems Pertinent items noted in HPI and remainder of comprehensive ROS otherwise negative.   Objective:   Blood pressure (!) 148/74, pulse 90, height 5\' 5"  (1.651 m), weight 188 lb (85.3 kg). General appearance: alert and no distress Pelvic: external genitalia with mild inflammation and hypokeratinization of labia majora. Agglutination of labia has resolved.  Cystocele (Grade 2) present.  Vagina normal appearing, no lesions or discharge.  Extremities: extremities normal, atraumatic, no cyanosis. Non-pitting edema bilaterally up to ankles   Assessment:   Vaginal atrophy Cystocele (Grade 2) Vaginal irritation H/o breast ancer  Plan:   - Patient has used cream for a total of 3 months, but still noting some mild external irritation. Exam noting labial inflammation and irritation.  Premarin cream may be causative factor. Discussed alternative options for management of vaginal atrophy.  Due to patient's past history (h/o breast cancer), can recommend non-estrogen methods, such as Intrarosa.  Discussed risks vs benefits. Given sample, and will call in.  Advised on taking steroid cream for external irritation (prescribed Clobetasol).   - To f/u in 3 months, or sooner as needed     Rubie Maid, MD [facility]

## 2017-05-20 ENCOUNTER — Telehealth: Payer: Self-pay | Admitting: Obstetrics and Gynecology

## 2017-05-20 NOTE — Telephone Encounter (Signed)
Patient called and LVM that [facility] doesn't cover the medication Temovate - can another medication be sent in instead?

## 2017-05-20 NOTE — Telephone Encounter (Signed)
Please advise 

## 2017-05-23 MED ORDER — CLOBETASOL PROPIONATE 0.05 % EX CREA: 1.0000 "application " | TOPICAL_CREAM | Freq: Two times a day (BID) | CUTANEOUS | 0 refills | Status: DC

## 2017-05-23 NOTE — Telephone Encounter (Signed)
Should have been the cream, not the ointment. I think I ordered it wrong.  I will change it.

## 2017-07-04 ENCOUNTER — Other Ambulatory Visit: Payer: Self-pay | Admitting: Family Medicine

## 2017-09-15 ENCOUNTER — Encounter: Payer: Self-pay | Admitting: Family Medicine

## 2017-09-15 ENCOUNTER — Ambulatory Visit (INDEPENDENT_AMBULATORY_CARE_PROVIDER_SITE_OTHER): Payer: Medicare Other | Admitting: Family Medicine

## 2017-09-15 VITALS — BP 156/70 | HR 100 | Temp 98.1°F | Wt 192.0 lb

## 2017-09-15 DIAGNOSIS — J069 Acute upper respiratory infection, unspecified: Secondary | ICD-10-CM | POA: Diagnosis not present

## 2017-09-15 NOTE — Patient Instructions (Signed)
It sounds like an uncomplicated cold that we can treat at home.    Colds are very common and may make you feel uncomfortable.   Colds are caused by viruses, and no medicine or 'shot'will cure an uncomplicated cold.  FOR A STUFFY NOSE - USE NASAL WASHES:   Saline (salt water) nasal irrigation (nasal wash) is an effective and simple home remedy for treating stuffy nose and sinus congestion. The nose can be irrigated by pouring, spraying, or squirting salt water into the nose and then letting it run back out. * How it Helps: The salt water rinses out excess mucus, washes out any irritants (dust, allergens) that might be present, and moistens the nasal cavity. * Methods: There are several ways to perform nasal irrigation. You can use a saline nasal spray bottle (available over-the-counter), a rubber ear syringe, a medical syringe without the needle, or a NETI POT. STEP-BY-STEP INSTRUCTIONS:   STEP 1: Lean over a sink. STEP 2: Gently squirt or spray warm salt water into one of your nostrils.  STEP 3: Some of the water may run into the back of your throat. Spit this out. If you swallow the salt water it will not hurt you. STEP 4: Blow your nose to clean out the water and mucus.  STEP 5: Repeat steps 1-4 for the other nostril. You can do this a couple times a day if it seems to help you.   HOW TO MAKE SALINE (SALT WATER) NASAL WASH:   You can make your own saline nasal wash.  * Add 1/2 tsp of table salt to 1 cup (8 oz; 240 ml) of warm water. * You should use sterile, distilled, or previously boiled water for nasal irrigation.   FOR A RUNNY NOSE - BLOW YOUR NOSE:   If the skin around your nostrils gets irritated, apply a tiny amount of petroleum ointment to the nasal openings once or twice a day. MEDICINES FOR STUFFY OR RUNNY NOSE: If you have a very runny nose and you really think you need a medicine, you can try using a nasal decongestant for a couple days.   TREATMENT FOR ASSOCIATED SYMPTOMS  OF COLDS:   * Sore throat: throat lozenges, hard candy or warm chicken broth. * For muscle aches, headaches, or moderate fever (over 101 degrees F) (38.9 C) use acetaminophen every 4 hours.   Cough: use cough drops.   Hydrate: drink extra liquids. HUMIDIFIER: If the air in your home is dry, use a humidifier.   CONTAGIOUSNESS: * The cold virus is present in your nasal secretions. * Cover your nose and mouth with a tissue when you sneeze or cough. * Wash your hands frequently with soap and water. * You can return to work or school after the fever is gone and you feel well enough to participate in normal activities.  EXPECTED COURSE:  * Nasal discharge 7-14 days * Cough 2-3 weeks. CALL BACK IF: * Fever lasts over 3 days * Runny nose lasts over 10 days * You become short of breath * You become worse

## 2017-09-15 NOTE — Progress Notes (Signed)
SUBJECTIVE:  [name] is a 76 y.o. female who complains of coryza, sneezing and productive cough for 7 days. She denies a history of anorexia, chest pain, chills, dizziness and fevers and denies a history of asthma. Patient denies smoke cigarettes.   Current Outpatient Prescriptions on File Prior to Visit  Medication Sig Dispense Refill  . ACCU-CHEK AVIVA PLUS test strip USE 1 STRIP TWICE A DAY TO CHECK BLOOD SUGAR 100 each 2  . acetaminophen (TYLENOL 8 HOUR) 650 MG CR tablet Take 1 tablet (650 mg total) by mouth every 8 (eight) hours as needed for pain. 30 tablet 1  . aspirin 81 MG tablet Take 81 mg by mouth at bedtime.     . BD PEN NEEDLE NANO U/F 32G X 4 MM MISC USE 1 PEN NEEDLE DAILY 100 each 3  . Blood Glucose Monitoring Suppl (ACCU-CHEK AVIVA PLUS) W/DEVICE KIT Use to check blood sugar twice daily     . clobetasol cream (TEMOVATE) 6.14 % Apply 1 application topically 2 (two) times daily. For 1 week, then decrease to as needed . 30 g 0  . cyanocobalamin (,VITAMIN B-12,) 1000 MCG/ML injection INJECT 1 ML ONCE A MONTH 1 mL 5  . diphenhydramine-acetaminophen (TYLENOL PM) 25-500 MG TABS Take 0.5 tablets by mouth at bedtime as needed (sleep).     . docusate sodium (COLACE) 100 MG capsule Take 1 capsule (100 mg total) by mouth 2 (two) times daily as needed for mild constipation. 60 capsule 2  . ezetimibe (ZETIA) 10 MG tablet TAKE ONE (1) TABLET EACH DAY 30 tablet 5  . fluticasone (FLONASE) 50 MCG/ACT nasal spray Place 2 sprays into both nostrils daily. 16 g 6  . Lancets (ACCU-CHEK MULTICLIX) lancets Use as instructed to check blood sugar twice daily E11.9 102 each 3  . LANTUS SOLOSTAR 100 UNIT/ML Solostar Pen INJECT 26 UNITS INTO THE SKIN AT BEDTIME. 6 pen 5  . meclizine (ANTIVERT) 25 MG tablet Take 1 tablet (25 mg total) by mouth 3 (three) times daily as needed for dizziness. 30 tablet 0  . metFORMIN (GLUCOPHAGE) 1000 MG tablet TAKE ONE (1) TABLET EACH DAY WITH LUNCH 90 tablet 3  .  Multiple Vitamins-Minerals (CENTRUM SILVER PO) Take 1 tablet by mouth daily.      . naproxen sodium (ANAPROX) 220 MG tablet Take 440 mg by mouth daily.    . Omega-3 Fatty Acids (FISH OIL PO) Take 1 capsule by mouth daily.     Marland Kitchen omeprazole (PRILOSEC) 20 MG capsule TAKE ONE (1) CAPSULE EACH DAY 90 capsule 2  . ondansetron (ZOFRAN) 4 MG tablet Take 1 tablet (4 mg total) by mouth daily as needed. 10 tablet 0  . OVER THE COUNTER MEDICATION Take 1 tablet by mouth daily. Immune System Support Supplement includes :  Echinacea Zinc Vitamin C    . oxycodone (OXY-IR) 5 MG capsule Take 1 capsule (5 mg total) by mouth every 4 (four) hours as needed. 30 capsule 0  . phenazopyridine (PYRIDIUM) 200 MG tablet Take 200 mg by mouth 3 (three) times daily as needed for pain.    . Prasterone (INTRAROSA) 6.5 MG INST Place 1 tablet vaginally at bedtime. 30 each 11  . valsartan-hydrochlorothiazide (DIOVAN-HCT) 160-25 MG tablet TAKE ONE (1) TABLET EACH DAY 90 tablet 3  . VICTOZA 18 MG/3ML SOPN INJECT 1.8 MG INTO THE SKIN DAILY. 1 pen 6   No current facility-administered medications on file prior to visit.     Allergies  Allergen Reactions  .  Atorvastatin Swelling    Leg swelling Other reaction(s): Unknown Leg swelling Leg swelling  . Azithromycin Diarrhea and Nausea And Vomiting    Other reaction(s): Nausea And Vomiting  . Ciprofloxacin Other (See Comments)    Does not remember Other reaction(s): Unknown tendonitis tendonitis  . Hydrocodone-Homatropine     REACTION: DROVE HER UP THE WALL  . Hydrocodone-Homatropine     Other reaction(s): Other (See Comments) Drowsiness Other reaction(s): Other (See Comments) REACTION: DROVE HER UP THE WALL  . Rosiglitazone Swelling    Other reaction(s): Unknown REACTION: SWELLING  . Rosiglitazone Maleate     REACTION: SWELLING    Past Medical History:  Diagnosis Date  . Arthritis   . Diabetes mellitus   . GERD (gastroesophageal reflux disease)   .  Hypertension   . Lymph edema    Rt arm  . Neuropathy   . Osteoporosis   . Thyroid disease     Past Surgical History:  Procedure Laterality Date  . ANTERIOR AND POSTERIOR REPAIR N/A 10/18/2016   Procedure: ANTERIOR (CYSTOCELE) AND POSTERIOR REPAIR (RECTOCELE);  Surgeon: Rubie Maid, MD;  Location: [facility] ORS;  Service: Gynecology;  Laterality: N/A;  . APPENDECTOMY    . BACK SURGERY  1983   discectomy  . cartoid u/s  8/41/66   06-30% LICA stenosis  . COLONOSCOPY    . DOPPLER ECHOCARDIOGRAPHY  02/24/06   Triv MR, EF 55-60%  . Viola   bilateral  . HEMORROIDECTOMY    . MASTECTOMY     right, modified radical+ chemo, 2/23 nodes 4/03  . miscarriage  1970  . mumps  1965   h/o  . OTHER SURGICAL HISTORY  04/03/02   dexa, wnl  . OTHER SURGICAL HISTORY  03/12/04   dexa, wnl  . OTHER SURGICAL HISTORY  08/12/05   dexa-osteopor Hip, osteopen spine  . OTHER SURGICAL HISTORY  10/25/07   dexa- nml hip and spine  . SEPTOPLASTY  1970  . TONSILLECTOMY  1945  . VAGINAL DELIVERY     x5  . VAGINAL HYSTERECTOMY  1974   partial, fibroids  . VENTRAL HERNIA REPAIR      Family History  Problem Relation Age of Onset  . Diabetes Mother   . Hypertension Mother   . Heart attack Mother   . Heart failure Father   . Hypertension Father   . Stroke Father   . Lung cancer Brother        hx of smoking  . Stroke Brother   . Heart attack Brother        PTCA MI x 2    Social History   Social History  . Marital status: Married    Spouse name: N/A  . Number of children: 5  . Years of education: N/A   Occupational History  . Retired Pharmacist, hospital Retired   Social History Main Topics  . Smoking status: Never Smoker  . Smokeless tobacco: Never Used  . Alcohol use No  . Drug use: No  . Sexual activity: Not Currently    Birth control/ protection: Surgical   Other Topics Concern  . Not on file   Social History Narrative   Married 50th anniversary (8/08)   Has a Living Will-  all of her children have a copy of it.   Desires CPR but would not want prolonged life support.               The PMH, PSH, Social History, Family History,  Medications, and allergies have been reviewed in [facility], and have been updated if relevant.  OBJECTIVE: BP (!) 156/70 (BP Location: Left Arm, Patient Position: Sitting, Cuff Size: Normal)   Pulse 100   Temp 98.1 F (36.7 C) (Oral)   Wt 192 lb (87.1 kg)   SpO2 98%   BMI 31.95 kg/m   She appears well, vital signs are as noted. Ears normal.  Throat and pharynx normal.  Neck supple. No adenopathy in the neck. Nose is congested. Sinuses non tender. The chest is clear, without wheezes or rales.  ASSESSMENT:  viral upper respiratory illness  PLAN: Symptomatic therapy suggested: push fluids, rest and return office visit prn if symptoms persist or worsen. Lack of antibiotic effectiveness discussed with her. Call or return to clinic prn if these symptoms worsen or fail to improve as anticipated.

## 2017-09-19 ENCOUNTER — Other Ambulatory Visit: Payer: Self-pay | Admitting: Family Medicine

## 2017-09-20 ENCOUNTER — Ambulatory Visit (INDEPENDENT_AMBULATORY_CARE_PROVIDER_SITE_OTHER): Payer: Medicare Other | Admitting: Obstetrics and Gynecology

## 2017-09-20 ENCOUNTER — Encounter: Payer: Self-pay | Admitting: Obstetrics and Gynecology

## 2017-09-20 VITALS — BP 161/78 | HR 99 | Ht 65.0 in | Wt 188.7 lb

## 2017-09-20 DIAGNOSIS — N8110 Cystocele, unspecified: Secondary | ICD-10-CM | POA: Diagnosis not present

## 2017-09-20 DIAGNOSIS — N898 Other specified noninflammatory disorders of vagina: Secondary | ICD-10-CM

## 2017-09-20 DIAGNOSIS — R351 Nocturia: Secondary | ICD-10-CM

## 2017-09-20 DIAGNOSIS — N952 Postmenopausal atrophic vaginitis: Secondary | ICD-10-CM

## 2017-09-20 DIAGNOSIS — M7989 Other specified soft tissue disorders: Secondary | ICD-10-CM

## 2017-09-20 MED ORDER — CLOBETASOL PROPIONATE 0.05 % EX CREA: 1.0000 "application " | TOPICAL_CREAM | Freq: Two times a day (BID) | CUTANEOUS | 0 refills | Status: DC

## 2017-09-20 NOTE — Progress Notes (Signed)
    GYNECOLOGY PROGRESS NOTE  Subjective:    Patient ID: [name], female    DOB: [birth year], 76 y.o.   MRN: [id number]  HPI  Patient is a 76 y.o. [id number] female who presents for f/u of vaginal atrophy, incomplete bladder emptying and nocturia (2-3 times per night, wears a pad at night).  Notes irritation of perineal region. Has been ongoing for ~ 1 month. Is wearing the pads more loosely with loose underwear, which she notices has helped decrease the irritation some.   The following portions of the patient's history were reviewed and updated as appropriate: allergies, current medications, past family history, past medical history, past social history, past surgical history and problem list.  Review of Systems Pertinent items noted in HPI and remainder of comprehensive ROS otherwise negative.   Objective:   Blood pressure (!) 161/78, pulse 99, height 5\' 5"  (1.651 m), weight 188 lb 11.2 oz (85.6 kg). General appearance: alert and no distress Pelvic: External genitalia overall normal appearing. Small amount of inflammation and rectovaginal septum. Cystocele (Grade 2) present.  Vagina normal appearing, with mild atrophy, no lesions or discharge.  Extremities: extremities normal, atraumatic, no cyanosis. Non-pitting edema bilaterally up to mid-calves.    Assessment:   Vaginal atrophy Cystocele (Grade 2) Nocturia Vaginal irritation Leg swelling  Plan:   - Patient has been using Intrarosa as prescribed. Is helping atrophy.  To continue. - Patient notes that when she had vaginal irritation before, the clobetasol helped.  Has been using the last of the small amount she had left from previous prescription, and is noting that it is helping her irritation. Desires refill.    - Nocturia present, noted that it was likely due to her increased hydration (notes drinking 8-10 cups of water daily), as well as her bilateral leg swelling.  Also may be due to the cystocele.  Patient not interested  in intervention currently for cystocele (declines having another surgery, and does not like the idea of the pessary).  - To f/u if symptoms worsen or don't improve.   Rubie Maid, MD [facility]

## 2017-10-04 ENCOUNTER — Other Ambulatory Visit: Payer: Self-pay | Admitting: Family Medicine

## 2017-10-31 ENCOUNTER — Other Ambulatory Visit: Payer: Self-pay | Admitting: Family Medicine

## 2017-11-10 ENCOUNTER — Telehealth: Payer: Self-pay

## 2017-11-10 ENCOUNTER — Telehealth: Payer: Self-pay | Admitting: Family Medicine

## 2017-11-10 ENCOUNTER — Other Ambulatory Visit: Payer: Self-pay

## 2017-11-10 MED ORDER — INSULIN GLARGINE 100 UNIT/ML SOLOSTAR PEN: PEN_INJECTOR | SUBCUTANEOUS | 0 refills | Status: AC

## 2017-11-10 MED ORDER — LIRAGLUTIDE 18 MG/3ML ~~LOC~~ SOPN: PEN_INJECTOR | SUBCUTANEOUS | 0 refills | Status: DC

## 2017-11-10 NOTE — Telephone Encounter (Signed)
Copied from [place]. Topic: Inquiry >> Nov 10, 2017 11:52 AM Pricilla Handler wrote: Reason for CRM: Patient called requesting refills for LANTUS SOLOSTAR 100 UNIT/ML Solostar Pen and VICTOZA 18 MG/3ML SOPN. Patient is a patient of Dr. Deborra Medina, but has chosen to remain at [facility] going forward. However, patient stated that she is almost out of her medication. Please contact patient with refill information. Thank You!!!

## 2017-11-10 NOTE — Telephone Encounter (Signed)
Copied from [place]. Topic: Inquiry >> Nov 10, 2017 11:52 AM Pricilla Handler wrote: Reason for CRM: Patient called requesting refills for LANTUS SOLOSTAR 100 UNIT/ML Solostar Pen and VICTOZA 18 MG/3ML SOPN. Patient is a patient of Dr. Deborra Medina, but has chosen to remain at [facility] going forward. However, patient stated that she is almost out of her medication. Please contact patient with refill information. Thank You!!!

## 2017-11-10 NOTE — Telephone Encounter (Signed)
This has been completed/see note/thx dmf

## 2017-11-10 NOTE — Progress Notes (Signed)
I have faxed in a refill till seen by new provider on 12.10.18/thx dmf

## 2017-11-10 NOTE — Telephone Encounter (Signed)
Pt aware Rx's were sent in to last till seen by new provider on 12.10.2018/thx dmf

## 2017-11-18 ENCOUNTER — Other Ambulatory Visit: Payer: Self-pay | Admitting: Family Medicine

## 2017-11-21 ENCOUNTER — Ambulatory Visit: Payer: Medicare Other | Admitting: Family Medicine

## 2017-11-24 ENCOUNTER — Other Ambulatory Visit (INDEPENDENT_AMBULATORY_CARE_PROVIDER_SITE_OTHER): Payer: Medicare Other

## 2017-11-24 ENCOUNTER — Telehealth: Payer: Self-pay | Admitting: Obstetrics and Gynecology

## 2017-11-24 DIAGNOSIS — R300 Dysuria: Secondary | ICD-10-CM | POA: Diagnosis not present

## 2017-11-24 LAB — POCT URINALYSIS DIPSTICK
Bilirubin, UA: NEGATIVE
Glucose, UA: NEGATIVE
KETONES UA: NEGATIVE
NITRITE UA: NEGATIVE
ODOR: NEGATIVE
PH UA: 8.5 — AB (ref 5.0–8.0)
PROTEIN UA: NEGATIVE
RBC UA: NEGATIVE
Spec Grav, UA: <=1.005 — AB (ref 1.010–1.025)
UROBILINOGEN UA: 0.2 U/dL

## 2017-11-24 MED ORDER — NITROFURANTOIN MONOHYD MACRO 100 MG PO CAPS: 100.0000 mg | ORAL_CAPSULE | Freq: Two times a day (BID) | ORAL | 0 refills | Status: DC

## 2017-11-24 NOTE — Telephone Encounter (Signed)
Pt aware cns sent. Macrobid erx.

## 2017-11-24 NOTE — Telephone Encounter (Signed)
The patient called and stated that she missed a call from a nurse, in regards to droppiong off her urine for a possible UTI. No other information was disclosed. Please advise.

## 2017-11-24 NOTE — Telephone Encounter (Signed)
No answer on home number- cell phone not available.

## 2017-11-26 LAB — URINE CULTURE

## 2017-11-28 ENCOUNTER — Telehealth: Payer: Self-pay

## 2017-11-28 MED ORDER — AMOXICILLIN-POT CLAVULANATE 875-125 MG PO TABS: 1.0000 | ORAL_TABLET | Freq: Two times a day (BID) | ORAL | 0 refills | Status: DC

## 2017-11-28 NOTE — Telephone Encounter (Signed)
N/a at home number vm not set up yet.   will try later.

## 2017-11-28 NOTE — Telephone Encounter (Signed)
Pt aware. Augmetin erx.

## 2017-11-28 NOTE — Telephone Encounter (Signed)
-----   Message from Rubie Maid, MD sent at 11/26/2017  4:22 PM EST ----- Patient with Proteus UTI.  Was given empiric treatment with Macrobid, but is resistant.  Patient can get a prescription for either Augmentin (BID x 7 days), or Bactrim (BIDI x 3 days), based on culture sensitivities and her list of allergies.

## 2017-12-11 DIAGNOSIS — M1712 Unilateral primary osteoarthritis, left knee: Secondary | ICD-10-CM | POA: Insufficient documentation

## 2017-12-28 ENCOUNTER — Other Ambulatory Visit: Payer: Self-pay | Admitting: Internal Medicine

## 2017-12-28 DIAGNOSIS — Z1231 Encounter for screening mammogram for malignant neoplasm of breast: Secondary | ICD-10-CM

## 2018-01-10 ENCOUNTER — Ambulatory Visit
Admission: RE | Admit: 2018-01-10 | Discharge: 2018-01-10 | Disposition: A | Discharge status: Home / Self Care | Payer: Medicare Other | Source: Ambulatory Visit | Attending: Internal Medicine | Admitting: Internal Medicine

## 2018-01-10 DIAGNOSIS — Z1231 Encounter for screening mammogram for malignant neoplasm of breast: Secondary | ICD-10-CM | POA: Diagnosis present

## 2018-01-10 HISTORY — DX: Personal history of antineoplastic chemotherapy: Z92.21

## 2018-01-10 HISTORY — DX: Personal history of irradiation: Z92.3

## 2018-02-11 ENCOUNTER — Other Ambulatory Visit: Payer: Self-pay

## 2018-02-11 ENCOUNTER — Encounter: Payer: Self-pay | Admitting: Emergency Medicine

## 2018-02-11 ENCOUNTER — Observation Stay
Admission: EM | Admit: 2018-02-11 | Discharge: 2018-02-13 | Disposition: A | Discharge status: Home / Self Care | Payer: Medicare Other | Attending: Internal Medicine | Admitting: Internal Medicine

## 2018-02-11 ENCOUNTER — Emergency Department: Payer: Medicare Other

## 2018-02-11 DIAGNOSIS — Z7951 Long term (current) use of inhaled steroids: Secondary | ICD-10-CM | POA: Insufficient documentation

## 2018-02-11 DIAGNOSIS — Z7982 Long term (current) use of aspirin: Secondary | ICD-10-CM | POA: Insufficient documentation

## 2018-02-11 DIAGNOSIS — A419 Sepsis, unspecified organism: Secondary | ICD-10-CM | POA: Diagnosis not present

## 2018-02-11 DIAGNOSIS — E1140 Type 2 diabetes mellitus with diabetic neuropathy, unspecified: Secondary | ICD-10-CM | POA: Insufficient documentation

## 2018-02-11 DIAGNOSIS — E860 Dehydration: Secondary | ICD-10-CM | POA: Insufficient documentation

## 2018-02-11 DIAGNOSIS — N179 Acute kidney failure, unspecified: Secondary | ICD-10-CM

## 2018-02-11 DIAGNOSIS — M7989 Other specified soft tissue disorders: Secondary | ICD-10-CM

## 2018-02-11 DIAGNOSIS — Z791 Long term (current) use of non-steroidal anti-inflammatories (NSAID): Secondary | ICD-10-CM | POA: Insufficient documentation

## 2018-02-11 DIAGNOSIS — R6510 Systemic inflammatory response syndrome (SIRS) of non-infectious origin without acute organ dysfunction: Secondary | ICD-10-CM

## 2018-02-11 DIAGNOSIS — L03113 Cellulitis of right upper limb: Secondary | ICD-10-CM | POA: Diagnosis not present

## 2018-02-11 DIAGNOSIS — Z853 Personal history of malignant neoplasm of breast: Secondary | ICD-10-CM | POA: Diagnosis not present

## 2018-02-11 DIAGNOSIS — E871 Hypo-osmolality and hyponatremia: Secondary | ICD-10-CM

## 2018-02-11 DIAGNOSIS — Z9011 Acquired absence of right breast and nipple: Secondary | ICD-10-CM | POA: Diagnosis not present

## 2018-02-11 DIAGNOSIS — E119 Type 2 diabetes mellitus without complications: Secondary | ICD-10-CM

## 2018-02-11 DIAGNOSIS — Z9221 Personal history of antineoplastic chemotherapy: Secondary | ICD-10-CM | POA: Diagnosis not present

## 2018-02-11 DIAGNOSIS — L03114 Cellulitis of left upper limb: Secondary | ICD-10-CM

## 2018-02-11 DIAGNOSIS — I10 Essential (primary) hypertension: Secondary | ICD-10-CM | POA: Diagnosis not present

## 2018-02-11 DIAGNOSIS — K219 Gastro-esophageal reflux disease without esophagitis: Secondary | ICD-10-CM | POA: Insufficient documentation

## 2018-02-11 DIAGNOSIS — M549 Dorsalgia, unspecified: Secondary | ICD-10-CM

## 2018-02-11 DIAGNOSIS — Z79899 Other long term (current) drug therapy: Secondary | ICD-10-CM | POA: Insufficient documentation

## 2018-02-11 DIAGNOSIS — L0390 Cellulitis, unspecified: Secondary | ICD-10-CM | POA: Diagnosis present

## 2018-02-11 DIAGNOSIS — Z794 Long term (current) use of insulin: Secondary | ICD-10-CM | POA: Insufficient documentation

## 2018-02-11 DIAGNOSIS — M810 Age-related osteoporosis without current pathological fracture: Secondary | ICD-10-CM | POA: Insufficient documentation

## 2018-02-11 DIAGNOSIS — Z923 Personal history of irradiation: Secondary | ICD-10-CM | POA: Insufficient documentation

## 2018-02-11 DIAGNOSIS — I890 Lymphedema, not elsewhere classified: Secondary | ICD-10-CM | POA: Diagnosis not present

## 2018-02-11 DIAGNOSIS — M545 Low back pain: Secondary | ICD-10-CM | POA: Diagnosis not present

## 2018-02-11 DIAGNOSIS — E785 Hyperlipidemia, unspecified: Secondary | ICD-10-CM | POA: Insufficient documentation

## 2018-02-11 HISTORY — DX: Malignant (primary) neoplasm, unspecified: C80.1

## 2018-02-11 HISTORY — DX: Malignant neoplasm of unspecified site of unspecified female breast: C50.919

## 2018-02-11 LAB — CBC
HCT: 35.6 % (ref 35.0–47.0)
Hemoglobin: 11.9 g/dL — ABNORMAL LOW (ref 12.0–16.0)
MCH: 29.7 pg (ref 26.0–34.0)
MCHC: 33.6 g/dL (ref 32.0–36.0)
MCV: 88.4 fL (ref 80.0–100.0)
Platelets: 264 10*3/uL (ref 150–440)
RBC: 4.02 MIL/uL (ref 3.80–5.20)
RDW: 13.9 % (ref 11.5–14.5)
WBC: 15.1 10*3/uL — ABNORMAL HIGH (ref 3.6–11.0)

## 2018-02-11 LAB — URINALYSIS, COMPLETE (UACMP) WITH MICROSCOPIC
Bilirubin Urine: NEGATIVE
GLUCOSE, UA: NEGATIVE mg/dL
Ketones, ur: NEGATIVE mg/dL
Leukocytes, UA: NEGATIVE
NITRITE: NEGATIVE
Protein, ur: NEGATIVE mg/dL
SPECIFIC GRAVITY, URINE: 1.015 (ref 1.005–1.030)
pH: 5 (ref 5.0–8.0)

## 2018-02-11 LAB — BASIC METABOLIC PANEL
ANION GAP: 12 (ref 5–15)
BUN: 43 mg/dL — AB (ref 6–20)
CALCIUM: 8.3 mg/dL — AB (ref 8.9–10.3)
CO2: 22 mmol/L (ref 22–32)
CREATININE: 1.23 mg/dL — AB (ref 0.44–1.00)
Chloride: 94 mmol/L — ABNORMAL LOW (ref 101–111)
GFR calc Af Amer: 47 mL/min — ABNORMAL LOW (ref >60)
GFR calc non Af Amer: 41 mL/min — ABNORMAL LOW (ref >60)
GLUCOSE: 94 mg/dL (ref 65–99)
Potassium: 4 mmol/L (ref 3.5–5.1)
Sodium: 128 mmol/L — ABNORMAL LOW (ref 135–145)

## 2018-02-11 LAB — GLUCOSE, CAPILLARY: GLUCOSE-CAPILLARY: 160 mg/dL — AB (ref 65–99)

## 2018-02-11 LAB — LACTIC ACID, PLASMA: Lactic Acid, Venous: 2 mmol/L (ref 0.5–1.9)

## 2018-02-11 MED ORDER — PIPERACILLIN-TAZOBACTAM 3.375 G IVPB 30 MIN
3.3750 g | Freq: Once | INTRAVENOUS | Status: AC
  Administered 2018-02-11: 3.375 g via INTRAVENOUS
  Filled 2018-02-11: qty 50

## 2018-02-11 MED ORDER — SODIUM CHLORIDE 0.9 % IV BOLUS (SEPSIS)
500.0000 mL | Freq: Once | INTRAVENOUS | Status: AC
  Administered 2018-02-11: 500 mL via INTRAVENOUS

## 2018-02-11 MED ORDER — IPRATROPIUM-ALBUTEROL 0.5-2.5 (3) MG/3ML IN SOLN: 3.0000 mL | Freq: Four times a day (QID) | RESPIRATORY_TRACT | Status: DC | PRN

## 2018-02-11 MED ORDER — ONDANSETRON HCL 4 MG/2ML IJ SOLN
4.0000 mg | Freq: Four times a day (QID) | INTRAMUSCULAR | Status: DC | PRN
Start: 2018-02-11 — End: 2018-02-13

## 2018-02-11 MED ORDER — VANCOMYCIN HCL IN DEXTROSE 1-5 GM/200ML-% IV SOLN: 1000.0000 mg | Freq: Once | INTRAVENOUS | Status: DC

## 2018-02-11 MED ORDER — VALSARTAN-HYDROCHLOROTHIAZIDE 160-25 MG PO TABS: 1.0000 | ORAL_TABLET | Freq: Every day | ORAL | Status: DC

## 2018-02-11 MED ORDER — IRBESARTAN 150 MG PO TABS
150.0000 mg | ORAL_TABLET | Freq: Every day | ORAL | Status: DC
  Administered 2018-02-12: 09:00:00 150 mg via ORAL
  Filled 2018-02-11: qty 1

## 2018-02-11 MED ORDER — BISACODYL 10 MG RE SUPP: 10.0000 mg | Freq: Every day | RECTAL | Status: DC | PRN

## 2018-02-11 MED ORDER — EZETIMIBE 10 MG PO TABS
10.0000 mg | ORAL_TABLET | Freq: Every day | ORAL | Status: DC
  Administered 2018-02-12 – 2018-02-13 (×2): 10 mg via ORAL
  Filled 2018-02-11 (×3): qty 1

## 2018-02-11 MED ORDER — ONDANSETRON HCL 4 MG PO TABS: 4.0000 mg | ORAL_TABLET | Freq: Four times a day (QID) | ORAL | Status: DC | PRN

## 2018-02-11 MED ORDER — INSULIN ASPART 100 UNIT/ML ~~LOC~~ SOLN
0.0000 [IU] | Freq: Three times a day (TID) | SUBCUTANEOUS | Status: DC
  Administered 2018-02-12 – 2018-02-13 (×2): 1 [IU] via SUBCUTANEOUS
  Filled 2018-02-11: qty 1

## 2018-02-11 MED ORDER — SODIUM CHLORIDE 0.9 % IV SOLN
INTRAVENOUS | Status: DC
  Administered 2018-02-11 – 2018-02-13 (×2): via INTRAVENOUS

## 2018-02-11 MED ORDER — DOCUSATE SODIUM 100 MG PO CAPS
100.0000 mg | ORAL_CAPSULE | Freq: Two times a day (BID) | ORAL | Status: DC
  Administered 2018-02-12 – 2018-02-13 (×3): 100 mg via ORAL
  Filled 2018-02-11 (×3): qty 1

## 2018-02-11 MED ORDER — ASPIRIN EC 81 MG PO TBEC
81.0000 mg | DELAYED_RELEASE_TABLET | Freq: Every day | ORAL | Status: DC
  Administered 2018-02-11 – 2018-02-12 (×2): 81 mg via ORAL
  Filled 2018-02-11 (×2): qty 1

## 2018-02-11 MED ORDER — METFORMIN HCL 500 MG PO TABS
1000.0000 mg | ORAL_TABLET | Freq: Every day | ORAL | Status: DC
  Administered 2018-02-12: 1000 mg via ORAL
  Filled 2018-02-11 (×2): qty 2

## 2018-02-11 MED ORDER — DIPHENHYDRAMINE HCL 25 MG PO CAPS
25.0000 mg | ORAL_CAPSULE | Freq: Three times a day (TID) | ORAL | Status: DC | PRN
  Administered 2018-02-11 – 2018-02-12 (×2): 25 mg via ORAL
  Filled 2018-02-11 (×2): qty 1

## 2018-02-11 MED ORDER — HYDROCHLOROTHIAZIDE 25 MG PO TABS
25.0000 mg | ORAL_TABLET | Freq: Every day | ORAL | Status: DC
  Administered 2018-02-12: 09:00:00 25 mg via ORAL
  Filled 2018-02-11: qty 1

## 2018-02-11 MED ORDER — IOPAMIDOL (ISOVUE-300) INJECTION 61%
30.0000 mL | Freq: Once | INTRAVENOUS | Status: AC | PRN
  Administered 2018-02-11: 30 mL via ORAL

## 2018-02-11 MED ORDER — IOPAMIDOL (ISOVUE-300) INJECTION 61%
75.0000 mL | Freq: Once | INTRAVENOUS | Status: AC | PRN
  Administered 2018-02-11: 75 mL via INTRAVENOUS

## 2018-02-11 MED ORDER — LIRAGLUTIDE 18 MG/3ML ~~LOC~~ SOPN: 1.8000 mg | PEN_INJECTOR | Freq: Every morning | SUBCUTANEOUS | Status: DC

## 2018-02-11 MED ORDER — VANCOMYCIN HCL IN DEXTROSE 1-5 GM/200ML-% IV SOLN
1000.0000 mg | INTRAVENOUS | Status: DC
  Administered 2018-02-11 – 2018-02-12 (×2): 1000 mg via INTRAVENOUS
  Filled 2018-02-11 (×3): qty 200

## 2018-02-11 MED ORDER — ACETAMINOPHEN 325 MG PO TABS: 650.0000 mg | ORAL_TABLET | Freq: Four times a day (QID) | ORAL | Status: DC | PRN

## 2018-02-11 MED ORDER — FENTANYL CITRATE (PF) 100 MCG/2ML IJ SOLN
25.0000 ug | Freq: Once | INTRAMUSCULAR | Status: AC
  Administered 2018-02-11: 25 ug via INTRAVENOUS
  Filled 2018-02-11: qty 2

## 2018-02-11 MED ORDER — PIPERACILLIN-TAZOBACTAM 3.375 G IVPB
3.3750 g | Freq: Three times a day (TID) | INTRAVENOUS | Status: DC
  Administered 2018-02-11 – 2018-02-13 (×5): 3.375 g via INTRAVENOUS
  Filled 2018-02-11 (×5): qty 50

## 2018-02-11 MED ORDER — INSULIN GLARGINE 100 UNIT/ML ~~LOC~~ SOLN
26.0000 [IU] | Freq: Every day | SUBCUTANEOUS | Status: DC
  Administered 2018-02-11 – 2018-02-12 (×2): 26 [IU] via SUBCUTANEOUS
  Filled 2018-02-11 (×4): qty 0.26

## 2018-02-11 MED ORDER — ONDANSETRON HCL 4 MG/2ML IJ SOLN
4.0000 mg | Freq: Once | INTRAMUSCULAR | Status: AC
  Administered 2018-02-11: 4 mg via INTRAVENOUS
  Filled 2018-02-11: qty 2

## 2018-02-11 MED ORDER — ACETAMINOPHEN 650 MG RE SUPP: 650.0000 mg | Freq: Four times a day (QID) | RECTAL | Status: DC | PRN

## 2018-02-11 MED ORDER — HEPARIN SODIUM (PORCINE) 5000 UNIT/ML IJ SOLN
5000.0000 [IU] | Freq: Three times a day (TID) | INTRAMUSCULAR | Status: DC
  Administered 2018-02-11 – 2018-02-13 (×5): 5000 [IU] via SUBCUTANEOUS
  Filled 2018-02-11 (×5): qty 1

## 2018-02-11 MED ORDER — IPRATROPIUM-ALBUTEROL 0.5-2.5 (3) MG/3ML IN SOLN: 3.0000 mL | Freq: Four times a day (QID) | RESPIRATORY_TRACT | Status: DC

## 2018-02-11 MED ORDER — PANTOPRAZOLE SODIUM 40 MG PO TBEC
40.0000 mg | DELAYED_RELEASE_TABLET | Freq: Every day | ORAL | Status: DC
  Administered 2018-02-12 – 2018-02-13 (×2): 40 mg via ORAL
  Filled 2018-02-11 (×2): qty 1

## 2018-02-11 MED ORDER — MECLIZINE HCL 25 MG PO TABS: 25.0000 mg | ORAL_TABLET | Freq: Three times a day (TID) | ORAL | Status: DC | PRN

## 2018-02-11 MED ORDER — OXYCODONE HCL 5 MG PO TABS
5.0000 mg | ORAL_TABLET | ORAL | Status: DC | PRN
  Administered 2018-02-11 – 2018-02-13 (×4): 5 mg via ORAL
  Filled 2018-02-11 (×4): qty 1

## 2018-02-11 NOTE — ED Provider Notes (Signed)
[facility] Emergency Department Provider Note ____________________________________________   I have reviewed the triage vital signs and the triage nursing note.  HISTORY  Chief Complaint Weakness; Dizziness; and Back Pain   Historian Patient and spouse  HPI [name] is a 77 y.o. female presenting here with her spouse of 38 years, states that she has not been feeling well for about 3 days now.  She started off with fatigue and then developed low back pain both sides without any known trauma or overuse injury.  States that she has had poor appetite and has not really ate or drank much in the last several days.  The last 24 hours she was unable to sleep overnight and could not get comfortable due to the back pain.  No new numbness or weakness.  She reports some mild shortness of breath but otherwise no congestion or any productive cough or fevers.  States that she has lymphedema to the upper extremities especially the right side when she had a history of breast cancer on that side, and had a rash the other day, but currently there is no rash or skin changes.   Past Medical History:  Diagnosis Date  . Arthritis   . Cancer ([place])   . Diabetes mellitus   . GERD (gastroesophageal reflux disease)   . Hypertension   . Lymph edema    Rt arm  . Neuropathy   . Osteoporosis   . Personal history of chemotherapy 2000   right breast  . Personal history of radiation therapy 2000   right breast  . Thyroid disease     Patient Active Problem List   Diagnosis Date Noted  . Bilateral leg edema 03/29/2017  . Female cystocele 12/07/2016  . Urinary frequency 12/07/2016  . Vaginal atrophy 12/07/2016  . Stress incontinence 09/24/2016  . External hemorrhoid 03/24/2016  . Hypothyroidism 03/24/2016  . Fatigue 10/21/2015  . HLD (hyperlipidemia) 04/28/2015  . Osteopenia 04/28/2015  . 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 HIATAL HERNIA 02/10/2010  .  ALLERGY, ENVIRONMENTAL 04/29/2009  . Malignant neoplasm of female breast ([place]) 04/26/2007  . Diabetes ([place]) 04/26/2007  . Essential hypertension 04/26/2007    Past Surgical History:  Procedure Laterality Date  . ANTERIOR AND POSTERIOR REPAIR N/A 10/18/2016   Procedure: ANTERIOR (CYSTOCELE) AND POSTERIOR REPAIR (RECTOCELE);  Surgeon: Rubie Maid, MD;  Location: [facility] ORS;  Service: Gynecology;  Laterality: N/A;  . APPENDECTOMY    . BACK SURGERY  1983   discectomy  . BREAST EXCISIONAL BIOPSY Left 12/11/2010   benign.  BENIGN BREAST TISSUE WITH STROMAL FIBROSIS, SCLEROSING ADENOSIS   . cartoid u/s  6/76/19   50-93% LICA stenosis  . COLONOSCOPY    . DOPPLER ECHOCARDIOGRAPHY  02/24/06   [place] MR, EF 55-60%  . [place]   bilateral  . HEMORROIDECTOMY    . MASTECTOMY Right 2000   right, modified radical+ chemo, 2/23 nodes 4/03  . miscarriage  1970  . mumps  1965   h/o  . OTHER SURGICAL HISTORY  04/03/02   dexa, wnl  . OTHER SURGICAL HISTORY  03/12/04   dexa, wnl  . OTHER SURGICAL HISTORY  08/12/05   dexa-osteopor Hip, osteopen spine  . OTHER SURGICAL HISTORY  10/25/07   dexa- nml hip and spine  . SEPTOPLASTY  1970  . TONSILLECTOMY  1945  . VAGINAL DELIVERY     x5  . VAGINAL HYSTERECTOMY  1974   partial, fibroids  .  VENTRAL HERNIA REPAIR      Prior to Admission medications   Medication Sig Start Date End Date Taking? Authorizing Provider  ACCU-CHEK AVIVA PLUS test strip USE 1 STRIP TWICE A DAY TO CHECK BLOOD SUGAR 11/18/17  Yes Lucille Passy, MD  acetaminophen (TYLENOL 8 HOUR) 650 MG CR tablet Take 1 tablet (650 mg total) by mouth every 8 (eight) hours as needed for pain. 10/19/16  Yes Rubie Maid, MD  aspirin 81 MG tablet Take 81 mg by mouth at bedtime.    Yes [provider]  BD PEN NEEDLE NANO U/F 32G X 4 MM MISC USE 1 PEN NEEDLE DAILY 04/04/17  Yes Lucille Passy, MD  Blood Glucose Monitoring Suppl (ACCU-CHEK AVIVA PLUS) W/DEVICE KIT Use to check blood  sugar twice daily    Yes [provider]  clobetasol cream (TEMOVATE) 2.13 % Apply 1 application topically 2 (two) times daily. For 1 week, then decrease to as needed . 09/20/17  Yes Rubie Maid, MD  co-enzyme Q-10 30 MG capsule Take 30 mg by mouth daily.   Yes [provider]  diphenhydramine-acetaminophen (TYLENOL PM) 25-500 MG TABS Take 0.5 tablets by mouth at bedtime as needed (sleep).    Yes [provider]  docusate sodium (COLACE) 100 MG capsule Take 1 capsule (100 mg total) by mouth 2 (two) times daily as needed for mild constipation. 10/19/16  Yes Rubie Maid, MD  ezetimibe (ZETIA) 10 MG tablet TAKE ONE (1) TABLET EACH DAY 09/19/17  Yes Lucille Passy, MD  Insulin Glargine (LANTUS SOLOSTAR) 100 UNIT/ML Solostar Pen INJECT 26 UNITS INTO THE SKIN AT BEDTIME. 11/10/17  Yes Lucille Passy, MD  Lancets (ACCU-CHEK MULTICLIX) lancets Use as instructed to check blood sugar twice daily E11.9 06/28/16  Yes Lucille Passy, MD  liraglutide (VICTOZA) 18 MG/3ML SOPN INJECT 1.8 MG INTO THE SKIN DAILY. 11/10/17  Yes Lucille Passy, MD  meclizine (ANTIVERT) 25 MG tablet Take 1 tablet (25 mg total) by mouth 3 (three) times daily as needed for dizziness. 01/28/17  Yes Orbie Pyo, MD  metFORMIN (GLUCOPHAGE) 1000 MG tablet TAKE ONE (1) TABLET EACH DAY WITH LUNCH 03/29/17  Yes Lucille Passy, MD  Multiple Vitamins-Minerals (CENTRUM SILVER PO) Take 1 tablet by mouth daily.     Yes [provider]  naproxen sodium (ANAPROX) 220 MG tablet Take 440 mg by mouth daily as needed.    Yes [provider]  Omega-3 Fatty Acids (FISH OIL PO) Take 1 capsule by mouth daily.    Yes [provider]  omeprazole (PRILOSEC) 20 MG capsule TAKE ONE (1) CAPSULE EACH DAY 07/04/17  Yes Lucille Passy, MD  OVER THE COUNTER MEDICATION Take 1 tablet by mouth daily. Immune System Support Supplement includes :  Echinacea Zinc Vitamin C   Yes [provider]  POTASSIUM PO  Take 1 tablet by mouth at bedtime.   Yes [provider]  TURMERIC PO Take 1 capsule by mouth daily.   Yes [provider]  valsartan-hydrochlorothiazide (DIOVAN-HCT) 160-25 MG tablet TAKE ONE (1) TABLET EACH DAY 03/15/17  Yes Lucille Passy, MD  amoxicillin-clavulanate (AUGMENTIN) 875-125 MG tablet Take 1 tablet by mouth 2 (two) times daily. Patient not taking: Reported on 02/11/2018 11/28/17   Rubie Maid, MD  cyanocobalamin (,VITAMIN B-12,) 1000 MCG/ML injection INJECT 1 ML ONCE A MONTH Patient not taking: Reported on 02/11/2018 11/13/15   Jearld Fenton, NP  fluticasone [facility]) 50 MCG/ACT nasal spray  Place 2 sprays into both nostrils daily. Patient not taking: Reported on 02/11/2018 01/29/15   Jearld Fenton, NP  nitrofurantoin, macrocrystal-monohydrate, (MACROBID) 100 MG capsule Take 1 capsule (100 mg total) by mouth 2 (two) times daily. Patient not taking: Reported on 02/11/2018 11/24/17   Rubie Maid, MD  ondansetron (ZOFRAN) 4 MG tablet Take 1 tablet (4 mg total) by mouth daily as needed. Patient not taking: Reported on 02/11/2018 01/28/17   Schaevitz, Randall An, MD  oxycodone (OXY-IR) 5 MG capsule Take 1 capsule (5 mg total) by mouth every 4 (four) hours as needed. Patient not taking: Reported on 02/11/2018 10/19/16   Rubie Maid, MD  Prasterone (INTRAROSA) 6.5 MG INST Place 1 tablet vaginally at bedtime. Patient not taking: Reported on 02/11/2018 05/19/17   Rubie Maid, MD    Allergies  Allergen Reactions  . Atorvastatin Swelling    Leg swelling Other reaction(s): Unknown Leg swelling Leg swelling  . Azithromycin Diarrhea and Nausea And Vomiting    Other reaction(s): Nausea And Vomiting  . Ciprofloxacin Other (See Comments)    Does not remember Other reaction(s): Unknown tendonitis tendonitis  . Hydrocodone-Homatropine     REACTION:   . Hydrocodone-Homatropine     Other reaction(s): Other (See Comments) Drowsiness Other reaction(s): Other (See  Comments) REACTION:  . Rosiglitazone Swelling    Other reaction(s): Unknown REACTION: SWELLING  . Rosiglitazone Maleate     REACTION: SWELLING    Family History  Problem Relation Age of Onset  . Diabetes Mother   . Hypertension Mother   . Heart attack Mother   . Heart failure Father   . Hypertension Father   . Stroke Father   . Lung cancer Brother        hx of smoking  . Stroke Brother   . Heart attack Brother        PTCA MI x 2  . Breast cancer Paternal Aunt        paternal great aunt    Social History Social History   Tobacco Use  . Smoking status: Never Smoker  . Smokeless tobacco: Never Used  Substance Use Topics  . Alcohol use: No    Alcohol/week: 0.0 oz  . Drug use: No    Review of Systems  Constitutional: Negative for fever. Eyes: Negative for visual changes. ENT: Negative for sore throat. Cardiovascular: Negative for chest pain. Respiratory: Occasional shortness of breath feeling without chest pain. Gastrointestinal: Negative for abdominal pain, vomiting and diarrhea.  Normal bowel movement yesterday. Genitourinary: Negative for dysuria. Musculoskeletal: Positive as per HPI for back pain. Skin: Negative for rash. Neurological: Negative for headache.  ____________________________________________   PHYSICAL EXAM:  VITAL SIGNS: ED Triage Vitals  Enc Vitals Group     BP 02/11/18 0926 (!) 119/53     Pulse Rate 02/11/18 0926 (!) 112     Resp 02/11/18 0926 20     Temp 02/11/18 0926 98 F (36.7 C)     Temp Source 02/11/18 0926 Oral     SpO2 02/11/18 0926 99 %     Weight 02/11/18 0927 186 lb (84.4 kg)     Height 02/11/18 0927 '5\' 5"'  (1.651 m)     Head Circumference --      Peak Flow --      Pain Score 02/11/18 0927 10     Pain Loc --      Pain Edu? --      Excl. in [place]? --      Constitutional: Alert  and oriented. Well appearing and in no distress. HEENT   Head: Normocephalic and atraumatic.      Eyes: Conjunctivae are normal. Pupils  equal and round.       Ears:         Nose: No congestion/rhinnorhea.   Mouth/Throat: Mucous membranes are moderately dry.   Neck: No stridor. Cardiovascular/Chest: Normal rate, regular rhythm.  No murmurs, rubs, or gallops. Respiratory: Normal respiratory effort without tachypnea nor retractions. Breath sounds are clear and equal bilaterally. No wheezes/rales/rhonchi. Gastrointestinal: Soft. No distention, no guarding, no rebound. Nontender.    Genitourinary/rectal:Deferred Musculoskeletal: Pain without point tenderness to the low back bilaterally.  Some lymphedema right upper extremity.  No redness no rash there.  Nontender with normal range of motion in all extremities. No joint effusions.  No lower extremity tenderness.  1+ lower extremity pitting edema bilaterally without leg pain or skin rash or redness. Neurologic:  Normal speech and language. No gross or focal neurologic deficits are appreciated. Skin:  Skin is warm, dry and intact. No rash noted. Psychiatric: Mood and affect are normal. Speech and behavior are normal. Patient exhibits appropriate insight and judgment.   ____________________________________________  LABS (pertinent positives/negatives) I, Lisa Roca, MD the attending physician have reviewed the labs noted below.  Labs Reviewed  BASIC METABOLIC PANEL - Abnormal; Notable for the following components:      Result Value   Sodium 128 (*)    Chloride 94 (*)    BUN 43 (*)    Creatinine, Ser 1.23 (*)    Calcium 8.3 (*)    GFR calc non Af Amer 41 (*)    GFR calc Af Amer 47 (*)    All other components within normal limits  CBC - Abnormal; Notable for the following components:   WBC 15.1 (*)    Hemoglobin 11.9 (*)    All other components within normal limits  URINALYSIS, COMPLETE (UACMP) WITH MICROSCOPIC - Abnormal; Notable for the following components:   Color, Urine AMBER (*)    APPearance CLOUDY (*)    Hgb urine dipstick SMALL (*)    Bacteria, UA RARE  (*)    Squamous Epithelial / LPF 6-30 (*)    All other components within normal limits  URINE CULTURE    ____________________________________________    EKG I, Lisa Roca, MD, the attending physician have personally viewed and interpreted all ECGs.  113 bpm.  Sinus tachycardia.  Narrow QRS.  Normal axis.  Nonspecific ST and T wave ____________________________________________  RADIOLOGY   DG lumbar spine reviewed and interpreted by me: no compression fracture Radiologist report:  IMPRESSION: 1. No acute findings. 2. Mild-to-moderate multilevel lumbar spine DDD, similar to the 09/2016 examination. 3. Aortic Atherosclerosis (ICD10-I70.0).  Chest x-ray reviewed and interpreted by me: No infiltrate Radiologist report:IMPRESSION: No acute cardiopulmonary disease.  The abdomen pelvis with contrast radiologist report IMPRESSION: 1. No acute findings in the abdomen or pelvis to account for the patient's symptoms. 2. Colonic diverticulosis without evidence of acute diverticulitis at this time. 3. Probable biliary sludge balls and/or noncalcified gallstones lying dependently in the gallbladder. No findings to suggest an acute cholecystitis at this time. 4. Aortic atherosclerosis, in addition to at least right coronary artery disease. Assessment for potential risk factor modification, dietary therapy or pharmacologic therapy may be warranted, if clinically indicated. 5. Additional incidental findings, as above.  Aortic Atherosclerosis (ICD10-I70.0). __________________________________________  PROCEDURES  Procedure(s) performed: None  Critical Care performed: None   ____________________________________________  ED COURSE / ASSESSMENT  AND PLAN  Pertinent labs & imaging results that were available during my care of the patient were reviewed by me and considered in my medical decision making (see chart for details).   Patient looks dehydrated and states that she is  mainly here because of low back pain.  She does look dehydrated and will start a little bit of fluids, although she does have some edema so we will go slowly with fluids.  Patient does have acute renal failure.  Will give additional 500 cc of fluid.  She has what looks to be a hyponatremia that is somewhat chronic.  She does have an elevated white blood cell count, awaiting urine sample.  Urinalysis does not appear likely the source of her discomfort.  No back pain seems to be better now.  We will send urine culture.  CT scan obtained to make sure nothing was missed from that perspective, and no acute findings there.  We did discuss the incidental finding of gallstones and diverticulosis and atherosclerosis and patient was given a copy of her report to have follow-up with her primary care doctor.  Again no focal right upper quadrant tenderness and I do not think that she is having symptoms consistent with cholecystitis.  Think she was somewhat dehydrated and she was given IV fluids here.  Blood pressure is now stable.  Her pain is under control.  Uncertain etiology, consider musculoskeletal.  On reevaluation, does have significant redness now going up her entire arm.  She does report chronic lymphedema to her right upper extremity but states that it is worse than it typically is.  It was somewhat red yesterday and she took Benadryl and stated that it got better.  But now it is red again and I am concerned that this could be cellulitic.  In the setting of elevated white blood cell count, the new worsening swelling with a new worsening redness and her hypotension episodes, although I do not have a high suspicion of sepsis at this point, I am going to go ahead and place her on the sepsis pathway on antibiotics for cellulitis given the tachycardia and the elevated white blood cell count in the right upper extremity apparent likely cellulitis.  DIFFERENTIAL DIAGNOSIS: Including but not limited to  urinary tract infection, kidney infection, kidney stone, musculoskeletal low back pain, epidural abscess, cauda equina, compression fracture, etc.  CONSULTATIONS:   Hospitalist, discussed with Dr. Doy Hutching.   Patient / Family / Caregiver informed of clinical course, medical decision-making process, and agree with plan.   ___________________________________________   FINAL CLINICAL IMPRESSION(S) / ED DIAGNOSES   Final diagnoses:  Hyponatremia  Acute renal failure, unspecified acute renal failure type ([place])  Musculoskeletal back pain  Cellulitis of left arm      ___________________________________________        Note: This dictation was prepared with Dragon dictation. Any transcriptional errors that result from this process are unintentional    Lisa Roca, MD 02/11/18 [phone]

## 2018-02-11 NOTE — ED Triage Notes (Addendum)
Patient to ED from home via [facility] for multiple medical complaints. Patient reports intermittent episodes of dizziness and weakness since Wednesday. Reports today she had several "episodes" where she felt like she was going to pass out. Also reports lower back pain for several days. Patient also states she has had decreased urine output and decreased appetite for 2-3 days. Reports taking a motion sickness pill this morning for the dizziness. Patient alert and oriented x4 upon arrival.

## 2018-02-11 NOTE — ED Notes (Signed)
Lab at bedside attempting to collect blood cultures x2 and lactic from patient. After several attempt, able to collect lactic but unable to collect cultures. States they will send third phlebotomist to attempt blood draw. MD aware.

## 2018-02-11 NOTE — H&P (Signed)
History and Physical    PROMISS LABARBERA TKZ:[id number] DOB: 01/03/1941 DOA: 02/11/2018  Referring physician: Dr. Reita Cliche PCP: Baxter Hire, MD  Specialists: none  Chief Complaint: RUE pain/swelling  HPI: [name] is a 77 y.o. female has a past medical history significant for HTN, DM, and breast cancer s/p right mastectomy now with fever and chill with nausea and dizziness. Associated with RUE pain, redness, and swelling. No vomiting or diarrhea. Denies CP or SOB. Tachycardic with elevated WBC in ER. RUE red and tender c/w cellulitis. Also c/o back pain. She is now admitted.  Review of Systems: The patient denies anorexia, weight loss,, vision loss, decreased hearing, hoarseness, chest pain, syncope, dyspnea on exertion,  balance deficits, hemoptysis, abdominal pain, melena, hematochezia, severe indigestion/heartburn, hematuria, incontinence, genital sores, muscle weakness, suspicious skin lesions, transient blindness, difficulty walking, depression, unusual weight change, abnormal bleeding, enlarged lymph nodes, angioedema, and breast masses.   Past Medical History:  Diagnosis Date  . Arthritis   . Cancer ([place])   . Diabetes mellitus   . GERD (gastroesophageal reflux disease)   . Hypertension   . Lymph edema    Rt arm  . Neuropathy   . Osteoporosis   . Personal history of chemotherapy 2000   right breast  . Personal history of radiation therapy 2000   right breast  . Thyroid disease    Past Surgical History:  Procedure Laterality Date  . ANTERIOR AND POSTERIOR REPAIR N/A 10/18/2016   Procedure: ANTERIOR (CYSTOCELE) AND POSTERIOR REPAIR (RECTOCELE);  Surgeon: Rubie Maid, MD;  Location: [facility] ORS;  Service: Gynecology;  Laterality: N/A;  . APPENDECTOMY    . BACK SURGERY  1983   discectomy  . BREAST EXCISIONAL BIOPSY Left 12/11/2010   benign.  BENIGN BREAST TISSUE WITH STROMAL FIBROSIS, SCLEROSING ADENOSIS   . cartoid u/s  5/73/22   02-54% LICA stenosis  . COLONOSCOPY     . DOPPLER ECHOCARDIOGRAPHY  02/24/06   Triv MR, EF 55-60%  . [place]   bilateral  . HEMORROIDECTOMY    . MASTECTOMY Right 2000   right, modified radical+ chemo, 2/23 nodes 4/03  . miscarriage  1970  . mumps  1965   h/o  . OTHER SURGICAL HISTORY  04/03/02   dexa, wnl  . OTHER SURGICAL HISTORY  03/12/04   dexa, wnl  . OTHER SURGICAL HISTORY  08/12/05   dexa-osteopor Hip, osteopen spine  . OTHER SURGICAL HISTORY  10/25/07   dexa- nml hip and spine  . SEPTOPLASTY  1970  . TONSILLECTOMY  1945  . VAGINAL DELIVERY     x5  . VAGINAL HYSTERECTOMY  1974   partial, fibroids  . VENTRAL HERNIA REPAIR     Social History:  reports that  has never smoked. she has never used smokeless tobacco. She reports that she does not drink alcohol or use drugs.  Allergies  Allergen Reactions  . Atorvastatin Swelling    Leg swelling Other reaction(s): Unknown Leg swelling Leg swelling  . Azithromycin Diarrhea and Nausea And Vomiting    Other reaction(s): Nausea And Vomiting  . Ciprofloxacin Other (See Comments)    Does not remember Other reaction(s): Unknown tendonitis tendonitis  . Hydrocodone-Homatropine     REACTION:   . Hydrocodone-Homatropine     Other reaction(s): Other (See Comments) Drowsiness Other reaction(s): Other (See Comments) REACTION:  . Rosiglitazone Swelling    Other reaction(s): Unknown REACTION: SWELLING  . Rosiglitazone Maleate     REACTION:  SWELLING    Family History  Problem Relation Age of Onset  . Diabetes Mother   . Hypertension Mother   . Heart attack Mother   . Heart failure Father   . Hypertension Father   . Stroke Father   . Lung cancer Brother        hx of smoking  . Stroke Brother   . Heart attack Brother        PTCA MI x 2  . Breast cancer Paternal Aunt        paternal great aunt    Prior to Admission medications   Medication Sig Start Date End Date Taking? Authorizing Provider  ACCU-CHEK AVIVA PLUS test strip USE 1  STRIP TWICE A DAY TO CHECK BLOOD SUGAR 11/18/17  Yes Lucille Passy, MD  acetaminophen (TYLENOL 8 HOUR) 650 MG CR tablet Take 1 tablet (650 mg total) by mouth every 8 (eight) hours as needed for pain. 10/19/16  Yes Rubie Maid, MD  aspirin 81 MG tablet Take 81 mg by mouth at bedtime.    Yes [provider]  BD PEN NEEDLE NANO U/F 32G X 4 MM MISC USE 1 PEN NEEDLE DAILY 04/04/17  Yes Lucille Passy, MD  Blood Glucose Monitoring Suppl (ACCU-CHEK AVIVA PLUS) W/DEVICE KIT Use to check blood sugar twice daily    Yes [provider]  clobetasol cream (TEMOVATE) 8.08 % Apply 1 application topically 2 (two) times daily. For 1 week, then decrease to as needed . 09/20/17  Yes Rubie Maid, MD  co-enzyme Q-10 30 MG capsule Take 30 mg by mouth daily.   Yes [provider]  diphenhydramine-acetaminophen (TYLENOL PM) 25-500 MG TABS Take 0.5 tablets by mouth at bedtime as needed (sleep).    Yes [provider]  docusate sodium (COLACE) 100 MG capsule Take 1 capsule (100 mg total) by mouth 2 (two) times daily as needed for mild constipation. 10/19/16  Yes Rubie Maid, MD  ezetimibe (ZETIA) 10 MG tablet TAKE ONE (1) TABLET EACH DAY 09/19/17  Yes Lucille Passy, MD  Insulin Glargine (LANTUS SOLOSTAR) 100 UNIT/ML Solostar Pen INJECT 26 UNITS INTO THE SKIN AT BEDTIME. 11/10/17  Yes Lucille Passy, MD  Lancets (ACCU-CHEK MULTICLIX) lancets Use as instructed to check blood sugar twice daily E11.9 06/28/16  Yes Lucille Passy, MD  liraglutide (VICTOZA) 18 MG/3ML SOPN INJECT 1.8 MG INTO THE SKIN DAILY. 11/10/17  Yes Lucille Passy, MD  meclizine (ANTIVERT) 25 MG tablet Take 1 tablet (25 mg total) by mouth 3 (three) times daily as needed for dizziness. 01/28/17  Yes Orbie Pyo, MD  metFORMIN (GLUCOPHAGE) 1000 MG tablet TAKE ONE (1) TABLET EACH DAY WITH LUNCH 03/29/17  Yes Lucille Passy, MD  Multiple Vitamins-Minerals (CENTRUM SILVER PO) Take 1 tablet by mouth daily.     Yes [provider]  naproxen sodium (ANAPROX) 220 MG tablet Take 440 mg by mouth daily as needed.    Yes [provider]  Omega-3 Fatty Acids (FISH OIL PO) Take 1 capsule by mouth daily.    Yes [provider]  omeprazole (PRILOSEC) 20 MG capsule TAKE ONE (1) CAPSULE EACH DAY 07/04/17  Yes Lucille Passy, MD  OVER THE COUNTER MEDICATION Take 1 tablet by mouth daily. Immune System Support Supplement includes :  Echinacea Zinc Vitamin C   Yes [provider]  POTASSIUM PO Take 1 tablet by mouth at bedtime.   Yes [provider]  TURMERIC PO  Take 1 capsule by mouth daily.   Yes [provider]  valsartan-hydrochlorothiazide (DIOVAN-HCT) 160-25 MG tablet TAKE ONE (1) TABLET EACH DAY 03/15/17  Yes Lucille Passy, MD  amoxicillin-clavulanate (AUGMENTIN) 875-125 MG tablet Take 1 tablet by mouth 2 (two) times daily. Patient not taking: Reported on 02/11/2018 11/28/17   Rubie Maid, MD  cyanocobalamin (,VITAMIN B-12,) 1000 MCG/ML injection INJECT 1 ML ONCE A MONTH Patient not taking: Reported on 02/11/2018 11/13/15   Jearld Fenton, NP  fluticasone [facility]) 50 MCG/ACT nasal spray Place 2 sprays into both nostrils daily. Patient not taking: Reported on 02/11/2018 01/29/15   Jearld Fenton, NP  nitrofurantoin, macrocrystal-monohydrate, (MACROBID) 100 MG capsule Take 1 capsule (100 mg total) by mouth 2 (two) times daily. Patient not taking: Reported on 02/11/2018 11/24/17   Rubie Maid, MD  ondansetron (ZOFRAN) 4 MG tablet Take 1 tablet (4 mg total) by mouth daily as needed. Patient not taking: Reported on 02/11/2018 01/28/17   Schaevitz, Randall An, MD  oxycodone (OXY-IR) 5 MG capsule Take 1 capsule (5 mg total) by mouth every 4 (four) hours as needed. Patient not taking: Reported on 02/11/2018 10/19/16   Rubie Maid, MD  Prasterone (INTRAROSA) 6.5 MG INST Place 1 tablet vaginally at bedtime. Patient not taking: Reported on 02/11/2018 05/19/17   Rubie Maid, MD   Physical  Exam: Vitals:   02/11/18 1430 02/11/18 1500 02/11/18 1530 02/11/18 1600  BP: 129/64 (!) 103/42 115/60 (!) 141/63  Pulse: (!) 101 97 99 (!) 108  Resp: '20 12 19 ' (!) 26  Temp:      TempSrc:      SpO2: 100% 100% 98% 100%  Weight:      Height:         General:  No apparent distress, WDWN, [place]/AT  Eyes: PERRL, EOMI, no scleral icterus, conjunctiva clear  ENT: moist oropharynx without exudate, TM's benign, dentition fair  Neck: supple, no lymphadenopathy. No bruits or thyromegaly  Cardiovascular: regular rate without MRG; 2+ peripheral pulses, no JVD, 2+ peripheral edema of RUE  Respiratory: CTA biL, good air movement without wheezing, rhonchi or crackled. Respiratory effort norma;  Abdomen: soft, non tender to palpation, positive bowel sounds, no guarding, no rebound  Skin: no rashes. RUE warm and tender with diffuse erythema from wrist to axilla  Musculoskeletal: normal bulk and tone, no joint swelling  Psychiatric: normal mood and affect, A&OX3  Neurologic: CN 2-12 grossly intact, Motor strength 5/5 in all 4 groups with symmetric DTR's and non-focal sensory exam  Labs on Admission:  Basic Metabolic Panel: Recent Labs  Lab 02/11/18 0926  NA 128*  K 4.0  CL 94*  CO2 22  GLUCOSE 94  BUN 43*  CREATININE 1.23*  CALCIUM 8.3*   Liver Function Tests: No results for input(s): AST, ALT, ALKPHOS, BILITOT, PROT, ALBUMIN in the last 168 hours. No results for input(s): LIPASE, AMYLASE in the last 168 hours. No results for input(s): AMMONIA in the last 168 hours. CBC: Recent Labs  Lab 02/11/18 0926  WBC 15.1*  HGB 11.9*  HCT 35.6  MCV 88.4  PLT 264   Cardiac Enzymes: No results for input(s): CKTOTAL, CKMB, CKMBINDEX, TROPONINI in the last 168 hours.  BNP (last 3 results) No results for input(s): BNP in the last 8760 hours.  ProBNP (last 3 results) Recent Labs    03/29/17 1259  PROBNP 39.0    CBG: No results for input(s): GLUCAP in the last 168  hours.  Radiological Exams on Admission:  Dg Chest 2 View  Result Date: 02/11/2018 CLINICAL DATA:  Shortness of breath. EXAM: CHEST  2 VIEW COMPARISON:  11/28/2013; 01/23/2009 FINDINGS: Unchanged cardiac silhouette and mediastinal contours with atherosclerotic plaque within thoracic aorta. There is mild elevation/eventration of the left hemidiaphragm. No focal airspace opacities. No pleural effusion or pneumothorax. No evidence of edema. No acute osseus abnormalities. Post right-sided mastectomy and right axillary adenectomy. Degenerative change of the left glenohumeral joint, incompletely evaluated. 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 IMPRESSION: No acute cardiopulmonary disease. Electronically Signed   By: Sandi Mariscal M.D.   On: 02/11/2018 10:37   Dg Lumbar Spine 2-3 Views  Result Date: 02/11/2018 CLINICAL DATA:  Weakness and dizziness. EXAM: LUMBAR SPINE - 2-3 VIEW COMPARISON:  CT abdomen pelvis-01/28/2017; lumbar spine MRI-09/16/2016 FINDINGS: There are 5 non rib-bearing lumbar type vertebral bodies. Mild scoliotic curvature of the thoracolumbar spine with dominant cranial component convex the left measuring approximately 14 degrees (as measured from the superior endplate of W09 to the inferior endplate L2). Mild (approximately 7 mm) of anterolisthesis of L4 upon L5, unchanged. Lumbar vertebral body heights appear preserved Mild to moderate multilevel lumbar spine DDD, worse at T12-L1 and L5-S1 with disc space height loss, endplate irregularity and sclerosis. Limited visualization of the bilateral SI joints is normal. Atherosclerotic plaque within the abdominal aorta and splenic artery. Regional soft tissues appear. IMPRESSION: 1. No acute findings. 2. Mild-to-moderate multilevel lumbar spine DDD, similar to the 09/2016 examination. 3.  Aortic Atherosclerosis (ICD10-I70.0). Electronically Signed   By: Sandi Mariscal M.D.   On: 02/11/2018 10:34   Ct Abdomen Pelvis W Contrast  Result Date:  02/11/2018 CLINICAL DATA:  77 year old female with history of low back pain for the past several days. Decreased urine output. Decreased appetite for the past 2-3 days. EXAM: CT ABDOMEN AND PELVIS WITH CONTRAST TECHNIQUE: Multidetector CT imaging of the abdomen and pelvis was performed using the standard protocol following bolus administration of intravenous contrast. CONTRAST:  38m ISOVUE-300 IOPAMIDOL (ISOVUE-300) INJECTION 61% COMPARISON:  CT the abdomen and pelvis 01/28/2017. FINDINGS: Lower chest: Bochdalek's hernia on the left. Atherosclerotic calcifications in the right coronary artery. Calcifications of the mitral annulus. Small hiatal hernia. Hepatobiliary: No suspicious cystic or solid hepatic lesions. No intra or extrahepatic biliary ductal dilatation. Small amount of intermediate attenuation material lying dependently in the gallbladder may reflect biliary sludge balls and/or noncalcified gallstones. No findings to suggest an acute cholecystitis at this time. Pancreas: No pancreatic mass. No pancreatic ductal dilatation. No pancreatic or peripancreatic fluid or inflammatory changes. Spleen: Unremarkable. Adrenals/Urinary Tract: In the lateral aspect of the lower pole of the right kidney there is a 2.1 cm simple cyst. Left kidney and bilateral adrenal glands are normal in appearance. No hydroureteronephrosis. Urinary bladder is normal in appearance. Stomach/Bowel: Normal appearance of the stomach. No pathologic dilatation of small bowel or colon. A few scattered colonic diverticulae are noted, without surrounding inflammatory changes to suggest an acute diverticulitis at this time. The appendix is not confidently identified and may be surgically absent. Regardless, there are no inflammatory changes noted adjacent to the cecum to suggest the presence of an acute appendicitis at this time. Vascular/Lymphatic: Aortic atherosclerosis, without evidence of aneurysm or dissection in the abdominal or pelvic  vasculature. No lymphadenopathy noted in the abdomen or pelvis. Reproductive: Status posthysterectomy.  Ovaries are atrophic. Other: No significant volume of ascites.  No pneumoperitoneum. Musculoskeletal: There are no aggressive appearing lytic or blastic lesions noted in the visualized portions of the  skeleton. IMPRESSION: 1. No acute findings in the abdomen or pelvis to account for the patient's symptoms. 2. Colonic diverticulosis without evidence of acute diverticulitis at this time. 3. Probable biliary sludge balls and/or noncalcified gallstones lying dependently in the gallbladder. No findings to suggest an acute cholecystitis at this time. 4. Aortic atherosclerosis, in addition to at least right coronary artery disease. Assessment for potential risk factor modification, dietary therapy or pharmacologic therapy may be warranted, if clinically indicated. 5. Additional incidental findings, as above. Aortic Atherosclerosis (ICD10-I70.0). Electronically Signed   By: Vinnie Langton M.D.   On: 02/11/2018 15:27    EKG: Independently reviewed.  Assessment/Plan Principal Problem:   SIRS (systemic inflammatory response syndrome) (HCC) Active Problems:   Diabetes (HCC)   Cellulitis of arm, right   Back pain   Will admit to floor with IV fluids and IV ABX. Cultures sent. Follow sugars. [place] of RUE. Repeat labs in AM  Diet: low carb Fluids: NS'@75'  DVT Prophylaxis: SQ Heparin  Code Status: FULL  Family Communication: yes  Disposition Plan: home  Time spent: 50 min

## 2018-02-11 NOTE — ED Notes (Signed)
Patient able to ambulate to restroom with assistance. Urine specimen collected and sent to lab.

## 2018-02-11 NOTE — ED Notes (Signed)
Per Dr. Doy Hutching, patient should begin IV antibiotics without blood cultures being obtained.

## 2018-02-11 NOTE — ED Notes (Signed)
Two unsuccessful attempts to catheterize patient by this RN and Elmo Putt, Therapist, sports.

## 2018-02-11 NOTE — ED Notes (Signed)
Lab called to try to obtain blood cultures and lactic for code sepsis.

## 2018-02-11 NOTE — ED Notes (Signed)
Unsuccessful catheterization attempted by Theadora Rama, RN. Patient states she will attempt to use BSC. MD aware of difficulty with cath.

## 2018-02-11 NOTE — Discharge Instructions (Signed)
You are evaluated for back pain, and although no certain cause was found, your exam and evaluation are overall reassuring in the emerge department today.  Return to the emergency department immediately for any worsening or uncontrolled back pain, weakness, numbness, abdominal pain, urinary symptoms, dizziness or passing out, fever, or any other symptoms concerning to you.  Your sodium level was a little bit low today.  Your kidney function looks like slight dehydration and you are given IV fluids.  You do need to follow-up with your doctor in 2 days for close follow-up.  You are given a printout of your CT scan results with a couple of what are called incidental findings, do not appear to be related to what is going on today, but you should have follow-up with your primary care doctor including atherosclerosis which is hardening of the arteries and put you at risk for things like stroke and heart attack, as well as gallstones, as well as diverticulosis, without the infection which would have been called diverticulitis.

## 2018-02-11 NOTE — ED Notes (Signed)
Patient up to restroom with minimal assistance

## 2018-02-11 NOTE — Progress Notes (Signed)
Pharmacy Antibiotic Note  KENI WAFER is a 77 y.o. female admitted on 02/11/2018 with cellulitis.  Pharmacy has been consulted for vancomycin and zosyn dosing. Also code sepsis   Plan: Vancomycin 1000mg  IV every 24 hours.  Goal trough 15-20 mcg/mL. Zosyn 3.375g IV q8h (4 hour infusion).  Height: 5\' 5"  (165.1 cm) Weight: 186 lb (84.4 kg) IBW/kg (Calculated) : 57  Temp (24hrs), Avg:98 F (36.7 C), Min:98 F (36.7 C), Max:98 F (36.7 C)  Recent Labs  Lab 02/11/18 0926  WBC 15.1*  CREATININE 1.23*    Estimated Creatinine Clearance: 40.5 mL/min (A) (by C-G formula based on SCr of 1.23 mg/dL (H)).    Allergies  Allergen Reactions  . Atorvastatin Swelling    Leg swelling Other reaction(s): Unknown Leg swelling Leg swelling  . Azithromycin Diarrhea and Nausea And Vomiting    Other reaction(s): Nausea And Vomiting  . Ciprofloxacin Other (See Comments)    Does not remember Other reaction(s): Unknown tendonitis tendonitis  . Hydrocodone-Homatropine     REACTION:   . Hydrocodone-Homatropine     Other reaction(s): Other (See Comments) Drowsiness Other reaction(s): Other (See Comments) REACTION:  . Rosiglitazone Swelling    Other reaction(s): Unknown REACTION: SWELLING  . Rosiglitazone Maleate     REACTION: SWELLING    Antimicrobials this admission: Anti-infectives (From admission, onward)   Start     Dose/Rate Route Frequency Ordered Stop   02/11/18 2200  piperacillin-tazobactam (ZOSYN) IVPB 3.375 g     3.375 g 12.5 mL/hr over 240 Minutes Intravenous Every 8 hours 02/11/18 1625     02/11/18 2100  vancomycin (VANCOCIN) IVPB 1000 mg/200 mL premix     1,000 mg 200 mL/hr over 60 Minutes Intravenous Every 24 hours 02/11/18 1625     02/11/18 1615  piperacillin-tazobactam (ZOSYN) IVPB 3.375 g     3.375 g 100 mL/hr over 30 Minutes Intravenous  Once 02/11/18 1605     02/11/18 1615  vancomycin (VANCOCIN) IVPB 1000 mg/200 mL premix     1,000 mg 200 mL/hr over 60 Minutes  Intravenous  Once 02/11/18 1605        Microbiology results: No results found for this or any previous visit (from the past 240 hour(s)).  Thank you for allowing pharmacy to be a part of this patient's care.  Donna Christen Morna Flud 02/11/2018 4:25 PM

## 2018-02-12 LAB — COMPREHENSIVE METABOLIC PANEL
ALBUMIN: 2.3 g/dL — AB (ref 3.5–5.0)
ALK PHOS: 84 U/L (ref 38–126)
ALT: 19 U/L (ref 14–54)
AST: 23 U/L (ref 15–41)
Anion gap: 11 (ref 5–15)
BUN: 33 mg/dL — ABNORMAL HIGH (ref 6–20)
CALCIUM: 7.9 mg/dL — AB (ref 8.9–10.3)
CHLORIDE: 96 mmol/L — AB (ref 101–111)
CO2: 21 mmol/L — AB (ref 22–32)
CREATININE: 1.23 mg/dL — AB (ref 0.44–1.00)
GFR calc Af Amer: 47 mL/min — ABNORMAL LOW (ref >60)
GFR calc non Af Amer: 41 mL/min — ABNORMAL LOW (ref >60)
GLUCOSE: 112 mg/dL — AB (ref 65–99)
Potassium: 3.5 mmol/L (ref 3.5–5.1)
SODIUM: 128 mmol/L — AB (ref 135–145)
Total Bilirubin: 1 mg/dL (ref 0.3–1.2)
Total Protein: 5.6 g/dL — ABNORMAL LOW (ref 6.5–8.1)

## 2018-02-12 LAB — GLUCOSE, CAPILLARY
GLUCOSE-CAPILLARY: 99 mg/dL (ref 65–99)
GLUCOSE-CAPILLARY: 110 mg/dL — AB (ref 65–99)
Glucose-Capillary: 144 mg/dL — ABNORMAL HIGH (ref 65–99)
Glucose-Capillary: 104 mg/dL — ABNORMAL HIGH (ref 65–99)

## 2018-02-12 LAB — CBC
HCT: 30.4 % — ABNORMAL LOW (ref 35.0–47.0)
HEMOGLOBIN: 10.7 g/dL — AB (ref 12.0–16.0)
MCH: 30.5 pg (ref 26.0–34.0)
MCHC: 35.1 g/dL (ref 32.0–36.0)
MCV: 86.9 fL (ref 80.0–100.0)
PLATELETS: 233 10*3/uL (ref 150–440)
RBC: 3.5 MIL/uL — ABNORMAL LOW (ref 3.80–5.20)
RDW: 14 % (ref 11.5–14.5)
WBC: 17 10*3/uL — ABNORMAL HIGH (ref 3.6–11.0)

## 2018-02-12 LAB — URINE CULTURE

## 2018-02-12 LAB — LACTIC ACID, PLASMA: Lactic Acid, Venous: 1.1 mmol/L (ref 0.5–1.9)

## 2018-02-12 NOTE — Progress Notes (Signed)
[facility] NAME: [name]    MR#:  [id number]  DATE OF BIRTH:  01-08-1941  SUBJECTIVE:  CHIEF COMPLAINT:   Chief Complaint  Patient presents with  . Weakness  . Dizziness  . Back Pain     Came with complaint of redness and swelling with warm skin on right upper extremity.  Slightly better today.  REVIEW OF SYSTEMS:  CONSTITUTIONAL: No fever, fatigue or weakness.  EYES: No blurred or double vision.  EARS, NOSE, AND THROAT: No tinnitus or ear pain.  RESPIRATORY: No cough, shortness of breath, wheezing or hemoptysis.  CARDIOVASCULAR: No chest pain, orthopnea, edema.  GASTROINTESTINAL: No nausea, vomiting, diarrhea or abdominal pain.  GENITOURINARY: No dysuria, hematuria.  ENDOCRINE: No polyuria, nocturia,  HEMATOLOGY: No anemia, easy bruising or bleeding SKIN: No rash or lesion. MUSCULOSKELETAL: No joint pain or arthritis.   NEUROLOGIC: No tingling, numbness, weakness.  PSYCHIATRY: No anxiety or depression.   ROS  DRUG ALLERGIES:   Allergies  Allergen Reactions  . Atorvastatin Swelling    Leg swelling Other reaction(s): Unknown Leg swelling Leg swelling  . Azithromycin Diarrhea and Nausea And Vomiting    Other reaction(s): Nausea And Vomiting  . Ciprofloxacin Other (See Comments)    Does not remember Other reaction(s): Unknown tendonitis tendonitis  . Hydrocodone-Homatropine     REACTION:   . Hydrocodone-Homatropine     Other reaction(s): Other (See Comments) Drowsiness Other reaction(s): Other (See Comments) REACTION:  . Rosiglitazone Swelling    Other reaction(s): Unknown REACTION: SWELLING  . Rosiglitazone Maleate     REACTION: SWELLING    VITALS:  Blood pressure (!) 90/52, pulse 88, temperature 98.1 F (36.7 C), temperature source Oral, resp. rate (!) 24, height 5\' 5"  (1.651 m), weight 87.9 kg (193 lb 12.8 oz), SpO2 96 %.  PHYSICAL EXAMINATION:  GENERAL:  77 y.o.-year-old patient lying in  the bed with no acute distress.  EYES: Pupils equal, round, reactive to light and accommodation. No scleral icterus. Extraocular muscles intact.  HEENT: Head atraumatic, normocephalic. Oropharynx and nasopharynx clear.  NECK:  Supple, no jugular venous distention. No thyroid enlargement, no tenderness.  LUNGS: Normal breath sounds bilaterally, no wheezing, rales,rhonchi or crepitation. No use of accessory muscles of respiration.  CARDIOVASCULAR: S1, S2 normal. No murmurs, rubs, or gallops.  ABDOMEN: Soft, nontender, nondistended. Bowel sounds present. No organomegaly or mass.  EXTREMITIES: No pedal edema, cyanosis, or clubbing. Right upper extremity had some redness but as per family this is much better compared to yesterday. NEUROLOGIC: Cranial nerves II through XII are intact. Muscle strength 4-5/5 in all extremities. Sensation intact. Gait not checked.  PSYCHIATRIC: The patient is alert and oriented x 3.  SKIN: No obvious rash, lesion, or ulcer.   Physical Exam LABORATORY PANEL:   CBC Recent Labs  Lab 02/12/18 0523  WBC 17.0*  HGB 10.7*  HCT 30.4*  PLT 233   ------------------------------------------------------------------------------------------------------------------  Chemistries  Recent Labs  Lab 02/12/18 0523  NA 128*  K 3.5  CL 96*  CO2 21*  GLUCOSE 112*  BUN 33*  CREATININE 1.23*  CALCIUM 7.9*  AST 23  ALT 19  ALKPHOS 84  BILITOT 1.0   ------------------------------------------------------------------------------------------------------------------  Cardiac Enzymes No results for input(s): TROPONINI in the last 168 hours. ------------------------------------------------------------------------------------------------------------------  RADIOLOGY:  Dg Chest 2 View  Result Date: 02/11/2018 CLINICAL DATA:  Shortness of breath. EXAM: CHEST  2 VIEW COMPARISON:  11/28/2013; 01/23/2009 FINDINGS: Unchanged cardiac silhouette and mediastinal contours with  atherosclerotic plaque within thoracic aorta. There is mild elevation/eventration of the left hemidiaphragm. No focal airspace opacities. No pleural effusion or pneumothorax. No evidence of edema. No acute osseus abnormalities. Post right-sided mastectomy and right axillary adenectomy. Degenerative change of the left glenohumeral joint, incompletely evaluated. 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 IMPRESSION: No acute cardiopulmonary disease. Electronically Signed   By: Sandi Mariscal M.D.   On: 02/11/2018 10:37   Dg Lumbar Spine 2-3 Views  Result Date: 02/11/2018 CLINICAL DATA:  Weakness and dizziness. EXAM: LUMBAR SPINE - 2-3 VIEW COMPARISON:  CT abdomen pelvis-01/28/2017; lumbar spine MRI-09/16/2016 FINDINGS: There are 5 non rib-bearing lumbar type vertebral bodies. Mild scoliotic curvature of the thoracolumbar spine with dominant cranial component convex the left measuring approximately 14 degrees (as measured from the superior endplate of P29 to the inferior endplate L2). Mild (approximately 7 mm) of anterolisthesis of L4 upon L5, unchanged. Lumbar vertebral body heights appear preserved Mild to moderate multilevel lumbar spine DDD, worse at T12-L1 and L5-S1 with disc space height loss, endplate irregularity and sclerosis. Limited visualization of the bilateral SI joints is normal. Atherosclerotic plaque within the abdominal aorta and splenic artery. Regional soft tissues appear. IMPRESSION: 1. No acute findings. 2. Mild-to-moderate multilevel lumbar spine DDD, similar to the 09/2016 examination. 3.  Aortic Atherosclerosis (ICD10-I70.0). Electronically Signed   By: Sandi Mariscal M.D.   On: 02/11/2018 10:34   Ct Abdomen Pelvis W Contrast  Result Date: 02/11/2018 CLINICAL DATA:  77 year old female with history of low back pain for the past several days. Decreased urine output. Decreased appetite for the past 2-3 days. EXAM: CT ABDOMEN AND PELVIS WITH CONTRAST TECHNIQUE: Multidetector CT imaging  of the abdomen and pelvis was performed using the standard protocol following bolus administration of intravenous contrast. CONTRAST:  52mL ISOVUE-300 IOPAMIDOL (ISOVUE-300) INJECTION 61% COMPARISON:  CT the abdomen and pelvis 01/28/2017. FINDINGS: Lower chest: Bochdalek's hernia on the left. Atherosclerotic calcifications in the right coronary artery. Calcifications of the mitral annulus. Small hiatal hernia. Hepatobiliary: No suspicious cystic or solid hepatic lesions. No intra or extrahepatic biliary ductal dilatation. Small amount of intermediate attenuation material lying dependently in the gallbladder may reflect biliary sludge balls and/or noncalcified gallstones. No findings to suggest an acute cholecystitis at this time. Pancreas: No pancreatic mass. No pancreatic ductal dilatation. No pancreatic or peripancreatic fluid or inflammatory changes. Spleen: Unremarkable. Adrenals/Urinary Tract: In the lateral aspect of the lower pole of the right kidney there is a 2.1 cm simple cyst. Left kidney and bilateral adrenal glands are normal in appearance. No hydroureteronephrosis. Urinary bladder is normal in appearance. Stomach/Bowel: Normal appearance of the stomach. No pathologic dilatation of small bowel or colon. A few scattered colonic diverticulae are noted, without surrounding inflammatory changes to suggest an acute diverticulitis at this time. The appendix is not confidently identified and may be surgically absent. Regardless, there are no inflammatory changes noted adjacent to the cecum to suggest the presence of an acute appendicitis at this time. Vascular/Lymphatic: Aortic atherosclerosis, without evidence of aneurysm or dissection in the abdominal or pelvic vasculature. No lymphadenopathy noted in the abdomen or pelvis. Reproductive: Status posthysterectomy.  Ovaries are atrophic. Other: No significant volume of ascites.  No pneumoperitoneum. Musculoskeletal: There are no aggressive appearing lytic or  blastic lesions noted in the visualized portions of the skeleton. IMPRESSION: 1. No acute findings in the abdomen or pelvis to account for the patient's symptoms. 2. Colonic diverticulosis without evidence of acute diverticulitis at this time. 3. Probable biliary  sludge balls and/or noncalcified gallstones lying dependently in the gallbladder. No findings to suggest an acute cholecystitis at this time. 4. Aortic atherosclerosis, in addition to at least right coronary artery disease. Assessment for potential risk factor modification, dietary therapy or pharmacologic therapy may be warranted, if clinically indicated. 5. Additional incidental findings, as above. Aortic Atherosclerosis (ICD10-I70.0). Electronically Signed   By: Vinnie Langton M.D.   On: 02/11/2018 15:27   US Venous Img Upper Uni Right  Result Date: 02/11/2018 CLINICAL DATA:  Right upper extremity swelling. EXAM: Right UPPER EXTREMITY VENOUS DOPPLER ULTRASOUND TECHNIQUE: Gray-scale sonography with graded compression, as well as color Doppler and duplex ultrasound were performed to evaluate the upper extremity deep venous system from the level of the subclavian vein and including the jugular, axillary, basilic, radial, ulnar and upper cephalic vein. Spectral Doppler was utilized to evaluate flow at rest and with distal augmentation maneuvers. COMPARISON:  None. FINDINGS: Contralateral Subclavian Vein: Respiratory phasicity is normal and symmetric with the symptomatic side. No evidence of thrombus. Normal compressibility. Internal Jugular Vein: No evidence of thrombus. Normal compressibility, respiratory phasicity and response to augmentation. Subclavian Vein: No evidence of thrombus. Normal compressibility, respiratory phasicity and response to augmentation. Axillary Vein: No evidence of thrombus. Normal compressibility, respiratory phasicity and response to augmentation. Cephalic Vein: No evidence of thrombus. Normal compressibility, respiratory  phasicity and response to augmentation. Basilic Vein: No evidence of thrombus. Normal compressibility, respiratory phasicity and response to augmentation. Brachial Veins: No evidence of thrombus. Normal compressibility, respiratory phasicity and response to augmentation. Radial Veins: No evidence of thrombus. Normal compressibility, respiratory phasicity and response to augmentation. Ulnar Veins: No evidence of thrombus. Normal compressibility, respiratory phasicity and response to augmentation. Venous Reflux:  None visualized. Other Findings:  None visualized. IMPRESSION: No evidence of DVT within the right upper extremity. Electronically Signed   By: Marijo Conception, M.D.   On: 02/11/2018 17:49    ASSESSMENT AND PLAN:   Principal Problem:   SIRS (systemic inflammatory response syndrome) (HCC) Active Problems:   Diabetes (HCC)   Cellulitis of arm, right   Back pain   Cellulitis  * Sepsis due to cellulitis   Present on admission with elevated white blood cell count, tachycardia, tachypnea   IV broad-spectrum antibiotics for now, cultures are sent   Patient is status post breast cancer surgery and lymph node removal.  * Hypertension   Blood pressure is running on the lower side, hold home medications with  * Diabetes   Continue metformin and Lantus and keep on sliding scale coverage.  * Acute renal insufficiency   Monitor with IV fluids.  All the records are reviewed and case discussed with Care Management/Social Workerr. Management plans discussed with the patient, family and they are in agreement.  CODE STATUS: Full code.  TOTAL TIME TAKING CARE OF THIS PATIENT: 35 minutes.   Patient's granddaughter was in the room during my visit.  POSSIBLE D/C IN 1-2 DAYS, DEPENDING ON CLINICAL CONDITION.   Vaughan Basta M.D on 02/12/2018   Between 7am to 6pm - Pager - [phone]  After 6pm go to [email or web address] - password [email or web address] [facility]  Office   [phone]  CC: Primary care physician; Baxter Hire, MD  Note: This dictation was prepared with Dragon dictation along with smaller phrase technology. Any transcriptional errors that result from this process are unintentional.

## 2018-02-13 LAB — BASIC METABOLIC PANEL
ANION GAP: 12 (ref 5–15)
BUN: 30 mg/dL — ABNORMAL HIGH (ref 6–20)
CALCIUM: 7.9 mg/dL — AB (ref 8.9–10.3)
CO2: 20 mmol/L — AB (ref 22–32)
CREATININE: 1.16 mg/dL — AB (ref 0.44–1.00)
Chloride: 97 mmol/L — ABNORMAL LOW (ref 101–111)
GFR, EST AFRICAN AMERICAN: 51 mL/min — AB (ref >60)
GFR, EST NON AFRICAN AMERICAN: 44 mL/min — AB (ref >60)
Glucose, Bld: 120 mg/dL — ABNORMAL HIGH (ref 65–99)
Potassium: 3.8 mmol/L (ref 3.5–5.1)
SODIUM: 129 mmol/L — AB (ref 135–145)

## 2018-02-13 LAB — CBC
HCT: 31.9 % — ABNORMAL LOW (ref 35.0–47.0)
Hemoglobin: 10.8 g/dL — ABNORMAL LOW (ref 12.0–16.0)
MCH: 29.6 pg (ref 26.0–34.0)
MCHC: 33.7 g/dL (ref 32.0–36.0)
MCV: 87.9 fL (ref 80.0–100.0)
PLATELETS: 258 10*3/uL (ref 150–440)
RBC: 3.63 MIL/uL — ABNORMAL LOW (ref 3.80–5.20)
RDW: 14.2 % (ref 11.5–14.5)
WBC: 15.1 10*3/uL — AB (ref 3.6–11.0)

## 2018-02-13 LAB — GLUCOSE, CAPILLARY
GLUCOSE-CAPILLARY: 149 mg/dL — AB (ref 65–99)
Glucose-Capillary: 78 mg/dL (ref 65–99)

## 2018-02-13 MED ORDER — FLUCONAZOLE 100 MG PO TABS: 100.0000 mg | ORAL_TABLET | Freq: Every day | ORAL | 0 refills | Status: AC

## 2018-02-13 MED ORDER — OXYCODONE HCL 5 MG PO CAPS: 5.0000 mg | ORAL_CAPSULE | ORAL | 0 refills | Status: DC | PRN

## 2018-02-13 MED ORDER — AMOXICILLIN-POT CLAVULANATE 875-125 MG PO TABS: 1.0000 | ORAL_TABLET | Freq: Two times a day (BID) | ORAL | 0 refills | Status: AC

## 2018-02-13 NOTE — Plan of Care (Signed)
  Progressing Education: Knowledge of General Education information will improve 02/13/2018 1443 - Progressing by Rowe Robert, RN Health Behavior/Discharge Planning: Ability to manage health-related needs will improve 02/13/2018 1443 - Progressing by Rowe Robert, RN Clinical Measurements: Ability to maintain clinical measurements within normal limits will improve 02/13/2018 1443 - Progressing by Rowe Robert, RN Will remain free from infection 02/13/2018 1443 - Progressing by Rowe Robert, RN Diagnostic test results will improve 02/13/2018 1443 - Progressing by Rowe Robert, RN Respiratory complications will improve 02/13/2018 1443 - Progressing by Rowe Robert, RN Cardiovascular complication will be avoided 02/13/2018 1443 - Progressing by Rowe Robert, RN Activity: Risk for activity intolerance will decrease 02/13/2018 1443 - Progressing by Rowe Robert, RN Nutrition: Adequate nutrition will be maintained 02/13/2018 1443 - Progressing by Rowe Robert, RN Coping: Level of anxiety will decrease 02/13/2018 1443 - Progressing by Rowe Robert, RN Elimination: Will not experience complications related to bowel motility 02/13/2018 1443 - Progressing by Rowe Robert, RN Will not experience complications related to urinary retention 02/13/2018 1443 - Progressing by Rowe Robert, RN Pain Managment: General experience of comfort will improve 02/13/2018 1443 - Progressing by Rowe Robert, RN Safety: Ability to remain free from injury will improve 02/13/2018 1443 - Progressing by Rowe Robert, RN Skin Integrity: Risk for impaired skin integrity will decrease 02/13/2018 1443 - Progressing by Rowe Robert, RN Clinical Measurements: Ability to avoid or minimize complications of infection will improve 02/13/2018 1443 - Progressing by Rowe Robert, RN Skin Integrity: Skin integrity will improve 02/13/2018 1443 - Progressing by Rowe Robert, RN

## 2018-02-13 NOTE — Progress Notes (Signed)
Pt ready for discharge home. A/o/ instructions discussed with pt.  meds presc diet activity and f/u discussed home via w/c w/o c/o tele  And iv d/cd earlier

## 2018-02-13 NOTE — Care Management Obs Status (Signed)
[place] [facility] NOTIFICATION   Patient Details  Name: [name] MRN: [id number] Date of Birth: March 26, 1941   Medicare Observation Status Notification Given:  Yes    Shelbie Ammons, RN 02/13/2018, 8:28 AM

## 2018-02-13 NOTE — Care Management Note (Signed)
Case Management Note  Patient Details  Name: [name] MRN: [id number] Date of Birth: 1941-07-30  Subjective/Objective:   Admitted to [facility] with the diagnosis of SIRS under observation status. Lives with husband, Truman Hayward [phone]) and other family members. Last seen Dr. Harrel Lemon 3 months ago. Next appointment is scheduled  For April 1. Prescriptions are filled at [facility]. No home health. No skilled facility. No home oxygen. Rolling walker and cane in the home. Takes care of all basic activities of daily living herself, drives. Decreased appetite, but weight gain (fluid). No falls. Wants to go home today               Action/Plan: Discussed home health needs. May need bedside commode, will let us know.   Expected Discharge Date:  02/13/18               Expected Discharge Plan:     In-House Referral:   yes  Discharge planning Services   yes  Post Acute Care Choice:    Choice offered to:     DME Arranged:    DME Agency:     HH Arranged:    HH Agency:     Status of Service:     If discussed at [employer], dates discussed:    Additional Comments:  Shelbie Ammons, RN MSN CCM Care Management [phone] 02/13/2018, 8:36 AM

## 2018-02-16 ENCOUNTER — Other Ambulatory Visit: Payer: Self-pay

## 2018-02-16 ENCOUNTER — Emergency Department: Payer: Medicare Other

## 2018-02-16 ENCOUNTER — Emergency Department
Admission: EM | Admit: 2018-02-16 | Discharge: 2018-02-16 | Disposition: A | Discharge status: Home / Self Care | Payer: Medicare Other | Attending: Emergency Medicine | Admitting: Emergency Medicine

## 2018-02-16 DIAGNOSIS — M549 Dorsalgia, unspecified: Secondary | ICD-10-CM | POA: Diagnosis present

## 2018-02-16 DIAGNOSIS — E119 Type 2 diabetes mellitus without complications: Secondary | ICD-10-CM | POA: Insufficient documentation

## 2018-02-16 DIAGNOSIS — I10 Essential (primary) hypertension: Secondary | ICD-10-CM | POA: Insufficient documentation

## 2018-02-16 DIAGNOSIS — Z853 Personal history of malignant neoplasm of breast: Secondary | ICD-10-CM | POA: Insufficient documentation

## 2018-02-16 DIAGNOSIS — M5431 Sciatica, right side: Secondary | ICD-10-CM | POA: Diagnosis not present

## 2018-02-16 DIAGNOSIS — E039 Hypothyroidism, unspecified: Secondary | ICD-10-CM | POA: Insufficient documentation

## 2018-02-16 LAB — CULTURE, BLOOD (ROUTINE X 2): Culture: NO GROWTH

## 2018-02-16 LAB — COMPREHENSIVE METABOLIC PANEL
ALBUMIN: 2.4 g/dL — AB (ref 3.5–5.0)
ALK PHOS: 97 U/L (ref 38–126)
ALT: 20 U/L (ref 14–54)
AST: 30 U/L (ref 15–41)
Anion gap: 10 (ref 5–15)
BILIRUBIN TOTAL: 1.2 mg/dL (ref 0.3–1.2)
BUN: 10 mg/dL (ref 6–20)
CO2: 26 mmol/L (ref 22–32)
CREATININE: 0.71 mg/dL (ref 0.44–1.00)
Calcium: 8.6 mg/dL — ABNORMAL LOW (ref 8.9–10.3)
Chloride: 89 mmol/L — ABNORMAL LOW (ref 101–111)
GFR calc Af Amer: >60 mL/min (ref >60)
Glucose, Bld: 111 mg/dL — ABNORMAL HIGH (ref 65–99)
Potassium: 4.3 mmol/L (ref 3.5–5.1)
Sodium: 125 mmol/L — ABNORMAL LOW (ref 135–145)
TOTAL PROTEIN: 6.6 g/dL (ref 6.5–8.1)

## 2018-02-16 LAB — CBC WITH DIFFERENTIAL/PLATELET
BASOS ABS: 0 10*3/uL (ref 0–0.1)
Basophils Relative: 0 %
EOS PCT: 0 %
Eosinophils Absolute: 0 10*3/uL (ref 0–0.7)
HEMATOCRIT: 31.5 % — AB (ref 35.0–47.0)
HEMOGLOBIN: 10.7 g/dL — AB (ref 12.0–16.0)
LYMPHS ABS: 1 10*3/uL (ref 1.0–3.6)
LYMPHS PCT: 6 %
MCH: 29.8 pg (ref 26.0–34.0)
MCHC: 33.9 g/dL (ref 32.0–36.0)
MCV: 87.8 fL (ref 80.0–100.0)
MONOS PCT: 9 %
Monocytes Absolute: 1.5 10*3/uL — ABNORMAL HIGH (ref 0.2–0.9)
Neutro Abs: 14.4 10*3/uL — ABNORMAL HIGH (ref 1.4–6.5)
Neutrophils Relative %: 85 %
Platelets: 355 10*3/uL (ref 150–440)
RBC: 3.59 MIL/uL — AB (ref 3.80–5.20)
RDW: 14 % (ref 11.5–14.5)
WBC: 16.9 10*3/uL — AB (ref 3.6–11.0)

## 2018-02-16 MED ORDER — HYDROMORPHONE HCL 1 MG/ML IJ SOLN
INTRAMUSCULAR | Status: AC
  Filled 2018-02-16: qty 1

## 2018-02-16 MED ORDER — DEXAMETHASONE 1.5 MG (35) PO TBPK: 1.5000 mg | ORAL_TABLET | Freq: Once | ORAL | 0 refills | Status: AC

## 2018-02-16 MED ORDER — OXYCODONE-ACETAMINOPHEN 7.5-325 MG PO TABS: 1.0000 | ORAL_TABLET | ORAL | 0 refills | Status: DC | PRN

## 2018-02-16 MED ORDER — LORAZEPAM 2 MG/ML IJ SOLN
0.5000 mg | Freq: Once | INTRAMUSCULAR | Status: AC
  Administered 2018-02-16: 0.5 mg via INTRAVENOUS
  Filled 2018-02-16: qty 1

## 2018-02-16 MED ORDER — HYDROMORPHONE HCL 1 MG/ML IJ SOLN
0.5000 mg | Freq: Once | INTRAMUSCULAR | Status: AC
  Administered 2018-02-16: 0.5 mg via INTRAVENOUS
  Filled 2018-02-16: qty 1

## 2018-02-16 MED ORDER — SODIUM CHLORIDE 0.9 % IV SOLN
Freq: Once | INTRAVENOUS | Status: AC
  Administered 2018-02-16: 13:00:00 via INTRAVENOUS

## 2018-02-16 MED ORDER — HYDROMORPHONE HCL 1 MG/ML IJ SOLN
1.0000 mg | Freq: Once | INTRAMUSCULAR | Status: AC
  Administered 2018-02-16: 1 mg via INTRAVENOUS

## 2018-02-16 MED ORDER — DEXAMETHASONE SODIUM PHOSPHATE 10 MG/ML IJ SOLN
10.0000 mg | Freq: Once | INTRAMUSCULAR | Status: AC
Start: 2018-02-16 — End: 2018-02-16
  Administered 2018-02-16: 10 mg via INTRAVENOUS
  Filled 2018-02-16: qty 1

## 2018-02-16 NOTE — ED Notes (Signed)
Center Line working

## 2018-02-16 NOTE — ED Provider Notes (Signed)
[facility] Emergency Department Provider Note       Time seen: ----------------------------------------- 12:31 PM on 02/16/2018 -----------------------------------------   I have reviewed the triage vital signs and the nursing notes.  HISTORY   Chief Complaint Back Pain    HPI [name] is a 77 y.o. female with a history of arthritis, breast cancer, diabetes, GERD, hypertension, and recent admission for cellulitis who presents to the ED for back pain that radiates down the right leg.  Patient is experiencing episodes of incontinence as well.  This is the reason she is been brought to the ER.  She came to the ER Saturday and she was given oxycodone with no relief.  She denies fevers or chills, has had poor appetite and complains of 10 out of 10 right lower back pain.  Past Medical History:  Diagnosis Date  . Arthritis   . Breast cancer ([place])   . Cancer ([place])   . Diabetes mellitus   . GERD (gastroesophageal reflux disease)   . Hypertension   . Lymph edema    Rt arm  . Neuropathy   . Osteoporosis   . Personal history of chemotherapy 2000   right breast  . Personal history of radiation therapy 2000   right breast  . Thyroid disease     Patient Active Problem List   Diagnosis Date Noted  . Cellulitis of arm, right 02/11/2018  . SIRS (systemic inflammatory response syndrome) ([place]) 02/11/2018  . Back pain 02/11/2018  . Cellulitis 02/11/2018  . Bilateral leg edema 03/29/2017  . Female cystocele 12/07/2016  . Urinary frequency 12/07/2016  . Vaginal atrophy 12/07/2016  . Stress incontinence 09/24/2016  . External hemorrhoid 03/24/2016  . Hypothyroidism 03/24/2016  . Fatigue 10/21/2015  . HLD (hyperlipidemia) 04/28/2015  . Osteopenia 04/28/2015  . 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 HIATAL HERNIA 02/10/2010  . ALLERGY, ENVIRONMENTAL 04/29/2009  . Malignant neoplasm of female breast ([place]) 04/26/2007  . Diabetes ([place])  04/26/2007  . Essential hypertension 04/26/2007    Past Surgical History:  Procedure Laterality Date  . ANTERIOR AND POSTERIOR REPAIR N/A 10/18/2016   Procedure: ANTERIOR (CYSTOCELE) AND POSTERIOR REPAIR (RECTOCELE);  Surgeon: Rubie Maid, MD;  Location: [facility] ORS;  Service: Gynecology;  Laterality: N/A;  . APPENDECTOMY    . BACK SURGERY  1983   discectomy  . BREAST EXCISIONAL BIOPSY Left 12/11/2010   benign.  BENIGN BREAST TISSUE WITH STROMAL FIBROSIS, SCLEROSING ADENOSIS   . cartoid u/s  08/05/22   53-61% LICA stenosis  . COLONOSCOPY    . DOPPLER ECHOCARDIOGRAPHY  02/24/06   [facility]riv MR, EF 55-60%  . [place]   bilateral  . HEMORROIDECTOMY    . MASTECTOMY Right 2000   right, modified radical+ chemo, 2/23 nodes 4/03  . miscarriage  1970  . mumps  1965   h/o  . OTHER SURGICAL HISTORY  04/03/02   dexa, wnl  . OTHER SURGICAL HISTORY  03/12/04   dexa, wnl  . OTHER SURGICAL HISTORY  08/12/05   dexa-osteopor Hip, osteopen spine  . OTHER SURGICAL HISTORY  10/25/07   dexa- nml hip and spine  . SEPTOPLASTY  1970  . TONSILLECTOMY  1945  . VAGINAL DELIVERY     x5  . VAGINAL HYSTERECTOMY  1974   partial, fibroids  . VENTRAL HERNIA REPAIR      Allergies Atorvastatin; Azithromycin; Ciprofloxacin; Hydrocodone-homatropine; Hydrocodone-homatropine; Rosiglitazone; and Rosiglitazone maleate  Social History Social History   Tobacco  Use  . Smoking status: Never Smoker  . Smokeless tobacco: Never Used  Substance Use Topics  . Alcohol use: No    Alcohol/week: 0.0 oz  . Drug use: No   Review of Systems Constitutional: Negative for fever. Cardiovascular: Negative for chest pain. Respiratory: Negative for shortness of breath. Gastrointestinal: Negative for abdominal pain, vomiting and diarrhea. Genitourinary: Positive for incontinence Musculoskeletal: Positive for back pain Skin: Negative for rash. Neurological: Negative for headaches, positive for weakness  All  systems negative/normal/unremarkable except as stated in the HPI  ____________________________________________   PHYSICAL EXAM:  VITAL SIGNS: ED Triage Vitals  Enc Vitals Group     BP 02/16/18 1115 (!) 144/66     Pulse Rate 02/16/18 1115 95     Resp 02/16/18 1115 16     Temp 02/16/18 1115 98.3 F (36.8 C)     Temp Source 02/16/18 1115 Oral     SpO2 02/16/18 1115 99 %     Weight 02/16/18 1112 187 lb (84.8 kg)     Height 02/16/18 1112 5\' 5"  (1.651 m)     Head Circumference --      Peak Flow --      Pain Score 02/16/18 1112 10     Pain Loc --      Pain Edu? --      Excl. in [place]? --    Constitutional: Alert and oriented. Well appearing and in no distress. Eyes: Conjunctivae are normal. Normal extraocular movements. ENT   Head: Normocephalic and atraumatic.   Nose: No congestion/rhinnorhea.   Mouth/Throat: Mucous membranes are moist.   Neck: No stridor. Cardiovascular: Normal rate, regular rhythm. No murmurs, rubs, or gallops. Respiratory: Normal respiratory effort without tachypnea nor retractions. Breath sounds are clear and equal bilaterally. No wheezes/rales/rhonchi. Gastrointestinal: Soft and nontender. Normal bowel sounds Musculoskeletal: Nontender with normal range of motion in extremities. No lower extremity tenderness nor edema. Neurologic:  Normal speech and language. No gross focal neurologic deficits are appreciated.  Positive right straight leg examination.  No obvious sensory or motor deficits are appreciated in the right lower extremity Skin:  Skin is warm, dry and intact. No rash noted. Psychiatric: Mood and affect are normal. Speech and behavior are normal.  ____________________________________________  ED COURSE:  As part of my medical decision making, I reviewed the following data within the [place] History obtained from family if available, nursing notes, old chart and ekg, as well as notes from prior ED visits. Patient  presented for acute symptoms of sciatica, we will assess with labs and imaging as indicated at this time. Clinical Course as of Feb 17 1527  Thu Feb 16, 2018  1505 Calcium: (!) 8.6 [KP]    Clinical Course User Index [KP] Harvest Dark, MD   Procedures ____________________________________________   LABS (pertinent positives/negatives)  Labs Reviewed  CBC WITH DIFFERENTIAL/PLATELET - Abnormal; Notable for the following components:      Result Value   WBC 16.9 (*)    RBC 3.59 (*)    Hemoglobin 10.7 (*)    HCT 31.5 (*)    Neutro Abs 14.4 (*)    Monocytes Absolute 1.5 (*)    All other components within normal limits  COMPREHENSIVE METABOLIC PANEL - Abnormal; Notable for the following components:   Sodium 125 (*)    Chloride 89 (*)    Glucose, Bld 111 (*)    Calcium 8.6 (*)    Albumin 2.4 (*)    All other components within normal  limits  URINALYSIS, COMPLETE (UACMP) WITH MICROSCOPIC    RADIOLOGY Images were viewed by me  MRI of the lumbar spine IMPRESSION: 1. No evidence for cauda equina syndrome. 2. Unchanged moderate-to-severe L4-L5 level lateral recess stenosis and moderate spinal canal stenosis. Unchanged grade 1 L4-5 anterolisthesis due to severe facet hypertrophy. Resolution of previously present medially projecting left-sided synovial cyst. 3. Mild L3-4 spinal canal stenosis, slightly progressed from the prior study. Moderate facet hypertrophy at this level has also progressed. ____________________________________________  DIFFERENTIAL DIAGNOSIS   Sciatica, cauda equina syndrome, metastasis, muscle strain, spasm, occult infection, dehydration  FINAL ASSESSMENT AND PLAN  Sciatica   Plan: The patient had presented for acute and severe right lower back pain that radiates down her right leg. Patient's labs revealed chronic hyponatremia.  Her sodium has improved 1 point from yesterday.  Her corrected calcium is within normal limits. Patient's imaging did  reveal some degenerative disc disease and chronic findings with no cauda equina.  I will give her a dose of IV Decadron and she will go home with a steroid taper.  I have advised her to monitor her blood sugar.  She has had home physical therapy arranged.  She is stable for outpatient follow-up.   Laurence Aly, MD   Note: This note was generated in part or whole with voice recognition software. Voice recognition is usually quite accurate but there are transcription errors that can and very often do occur. I apologize for any typographical errors that were not detected and corrected.     Earleen Newport, MD 02/16/18 [phone]

## 2018-02-16 NOTE — ED Notes (Signed)
Patient transported to MRI 

## 2018-02-16 NOTE — ED Triage Notes (Signed)
Pt c/o lower back pain that radiates down right leg - pt is experiencing episodes of incontinence - she came to er Saturday and they gave her oxycodone with no relief

## 2018-02-17 LAB — CULTURE, BLOOD (ROUTINE X 2): Culture: NO GROWTH

## 2018-02-22 NOTE — Discharge Summary (Signed)
[facility] NAME: [name]    MR#:  [id number]  DATE OF BIRTH:  [birth year]  DATE OF ADMISSION:  02/11/2018 ADMITTING PHYSICIAN: Idelle Crouch, MD  DATE OF DISCHARGE: 02/13/2018  2:08 PM  PRIMARY CARE PHYSICIAN: Baxter Hire, MD    ADMISSION DIAGNOSIS:  Hyponatremia [E87.1] Cellulitis of left arm [L03.114] Swelling of right upper extremity [M79.89] Acute renal failure, unspecified acute renal failure type ([place]) [N17.9] Musculoskeletal back pain [M54.9]  DISCHARGE DIAGNOSIS:  Principal Problem:   SIRS (systemic inflammatory response syndrome) (HCC) Active Problems:   Diabetes (HCC)   Cellulitis of arm, right   Back pain   Cellulitis   SECONDARY DIAGNOSIS:   Past Medical History:  Diagnosis Date  . Arthritis   . Breast cancer ([place])   . Cancer ([place])   . Diabetes mellitus   . GERD (gastroesophageal reflux disease)   . Hypertension   . Lymph edema    Rt arm  . Neuropathy   . Osteoporosis   . Personal history of chemotherapy 2000   right breast  . Personal history of radiation therapy 2000   right breast  . Thyroid disease     HOSPITAL COURSE:   * Sepsis due to cellulitis   Present on admission with elevated white blood cell count, tachycardia, tachypnea   IV broad-spectrum antibiotics for now, cultures are sent   Patient is status post breast cancer surgery and lymph node removal.  * Hypertension   Blood pressure is running on the lower side, hold home medications with  * Diabetes   Continue metformin and Lantus and keep on sliding scale coverage.  * Acute renal insufficiency   Monitor with IV fluids.    DISCHARGE CONDITIONS:   Stable.  CONSULTS OBTAINED:    DRUG ALLERGIES:   Allergies  Allergen Reactions  . Atorvastatin Swelling    Leg swelling Other reaction(s): Unknown Leg swelling Leg swelling  . Azithromycin Diarrhea and Nausea And Vomiting    Other reaction(s):  Nausea And Vomiting  . Ciprofloxacin Other (See Comments)    Does not remember Other reaction(s): Unknown tendonitis tendonitis  . Hydrocodone-Homatropine     REACTION:   . Hydrocodone-Homatropine     Other reaction(s): Other (See Comments) Drowsiness Other reaction(s): Other (See Comments) REACTION:  . Rosiglitazone Swelling    Other reaction(s): Unknown REACTION: SWELLING  . Rosiglitazone Maleate     REACTION: SWELLING    DISCHARGE MEDICATIONS:   Allergies as of 02/13/2018      Reactions   Atorvastatin Swelling   Leg swelling Other reaction(s): Unknown Leg swelling Leg swelling   Azithromycin Diarrhea, Nausea And Vomiting   Other reaction(s): Nausea And Vomiting   Ciprofloxacin Other (See Comments)   Does not remember Other reaction(s): Unknown tendonitis tendonitis   Hydrocodone-homatropine    REACTION:    Hydrocodone-homatropine    Other reaction(s): Other (See Comments) Drowsiness Other reaction(s): Other (See Comments) REACTION:   Rosiglitazone Swelling   Other reaction(s): Unknown REACTION: SWELLING   Rosiglitazone Maleate    REACTION: SWELLING      Medication List    STOP taking these medications   amoxicillin-clavulanate 875-125 MG tablet Commonly known as:  AUGMENTIN   nitrofurantoin (macrocrystal-monohydrate) 100 MG capsule Commonly known as:  MACROBID   valsartan-hydrochlorothiazide 160-25 MG tablet Commonly known as:  DIOVAN-HCT     TAKE these medications   ACCU-CHEK AVIVA PLUS test strip Generic drug:  glucose blood USE 1 STRIP  TWICE A DAY TO CHECK BLOOD SUGAR   ACCU-CHEK AVIVA PLUS w/Device Kit Use to check blood sugar twice daily   accu-chek multiclix lancets Use as instructed to check blood sugar twice daily E11.9   acetaminophen 650 MG CR tablet Commonly known as:  TYLENOL 8 HOUR Take 1 tablet (650 mg total) by mouth every 8 (eight) hours as needed for pain.   aspirin 81 MG tablet Take 81 mg by mouth at bedtime.   BD PEN  NEEDLE NANO U/F 32G X 4 MM Misc Generic drug:  Insulin Pen Needle USE 1 PEN NEEDLE DAILY   CENTRUM SILVER PO Take 1 tablet by mouth daily.   clobetasol cream 0.05 % Commonly known as:  TEMOVATE Apply 1 application topically 2 (two) times daily. For 1 week, then decrease to as needed .   co-enzyme Q-10 30 MG capsule Take 30 mg by mouth daily.   cyanocobalamin 1000 MCG/ML injection Commonly known as:  (VITAMIN B-12) INJECT 1 ML ONCE A MONTH   diphenhydramine-acetaminophen 25-500 MG Tabs tablet Commonly known as:  TYLENOL PM Take 0.5 tablets by mouth at bedtime as needed (sleep).   docusate sodium 100 MG capsule Commonly known as:  COLACE Take 1 capsule (100 mg total) by mouth 2 (two) times daily as needed for mild constipation.   ezetimibe 10 MG tablet Commonly known as:  ZETIA TAKE ONE (1) TABLET EACH DAY   FISH OIL PO Take 1 capsule by mouth daily.   fluticasone 50 MCG/ACT nasal spray Commonly known as:  FLONASE Place 2 sprays into both nostrils daily.   Insulin Glargine 100 UNIT/ML Solostar Pen Commonly known as:  LANTUS SOLOSTAR INJECT 26 UNITS INTO THE SKIN AT BEDTIME.   liraglutide 18 MG/3ML Sopn Commonly known as:  VICTOZA INJECT 1.8 MG INTO THE SKIN DAILY.   meclizine 25 MG tablet Commonly known as:  ANTIVERT Take 1 tablet (25 mg total) by mouth 3 (three) times daily as needed for dizziness.   metFORMIN 1000 MG tablet Commonly known as:  GLUCOPHAGE TAKE ONE (1) TABLET EACH DAY WITH LUNCH   naproxen sodium 220 MG tablet Commonly known as:  ALEVE Take 440 mg by mouth daily as needed.   omeprazole 20 MG capsule Commonly known as:  PRILOSEC TAKE ONE (1) CAPSULE EACH DAY   ondansetron 4 MG tablet Commonly known as:  ZOFRAN Take 1 tablet (4 mg total) by mouth daily as needed.   OVER THE COUNTER MEDICATION Take 1 tablet by mouth daily. Immune System Support Supplement includes :  Echinacea Zinc Vitamin C   oxycodone 5 MG capsule Commonly known  as:  OXY-IR Take 1 capsule (5 mg total) by mouth every 4 (four) hours as needed.   POTASSIUM PO Take 1 tablet by mouth at bedtime.   Prasterone 6.5 MG Inst Commonly known as:  INTRAROSA Place 1 tablet vaginally at bedtime.   TURMERIC PO Take 1 capsule by mouth daily.     ASK your doctor about these medications   amoxicillin-clavulanate 875-125 MG tablet Commonly known as:  AUGMENTIN Take 1 tablet by mouth 2 (two) times daily for 7 days. Ask about: Should I take this medication?   fluconazole 100 MG tablet Commonly known as:  DIFLUCAN Take 1 tablet (100 mg total) by mouth daily for 1 day. Ask about: Should I take this medication?        DISCHARGE INSTRUCTIONS:    Follow with PMD in 1-2 weeks.  If you experience worsening of your admission  symptoms, develop shortness of breath, life threatening emergency, suicidal or homicidal thoughts you must seek medical attention immediately by calling 911 or calling your MD immediately  if symptoms less severe.  You Must read complete instructions/literature along with all the possible adverse reactions/side effects for all the Medicines you take and that have been prescribed to you. Take any new Medicines after you have completely understood and accept all the possible adverse reactions/side effects.   Please note  You were cared for by a hospitalist during your hospital stay. If you have any questions about your discharge medications or the care you received while you were in the hospital after you are discharged, you can call the unit and asked to speak with the hospitalist on call if the hospitalist that took care of you is not available. Once you are discharged, your primary care physician will handle any further medical issues. Please note that NO REFILLS for any discharge medications will be authorized once you are discharged, as it is imperative that you return to your primary care physician (or establish a relationship with a  primary care physician if you do not have one) for your aftercare needs so that they can reassess your need for medications and monitor your lab values.    Today   CHIEF COMPLAINT:   Chief Complaint  Patient presents with  . Weakness  . Dizziness  . Back Pain    HISTORY OF PRESENT ILLNESS:  [name]  is a 77 y.o. female with a known history of HTN, DM, and breast cancer s/p right mastectomy now with fever and chill with nausea and dizziness. Associated with RUE pain, redness, and swelling. No vomiting or diarrhea. Denies CP or SOB. Tachycardic with elevated WBC in ER. RUE red and tender c/w cellulitis. Also c/o back pain. She is now admitted.   VITAL SIGNS:  Blood pressure (!) 119/46, pulse 87, temperature (!) 97.5 F (36.4 C), temperature source Oral, resp. rate 20, height _0  (1.651 m), weight 92.8 kg (204 lb 9.6 oz), SpO2 98 %.  I/O:  No intake or output data in the 24 hours ending 02/22/18 0956  PHYSICAL EXAMINATION:   GENERAL:  77 y.o.-year-old patient lying in the bed with no acute distress.  EYES: Pupils equal, round, reactive to light and accommodation. No scleral icterus. Extraocular muscles intact.  HEENT: Head atraumatic, normocephalic. Oropharynx and nasopharynx clear.  NECK:  Supple, no jugular venous distention. No thyroid enlargement, no tenderness.  LUNGS: Normal breath sounds bilaterally, no wheezing, rales,rhonchi or crepitation. No use of accessory muscles of respiration.  CARDIOVASCULAR: S1, S2 normal. No murmurs, rubs, or gallops.  ABDOMEN: Soft, nontender, nondistended. Bowel sounds present. No organomegaly or mass.  EXTREMITIES: No pedal edema, cyanosis, or clubbing. Right upper extremity had some redness but as per family this is much better compared to yesterday. NEUROLOGIC: Cranial nerves II through XII are intact. Muscle strength 4-5/5 in all extremities. Sensation intact. Gait not checked.  PSYCHIATRIC: The patient is alert and oriented x 3.   SKIN: No obvious rash, lesion, or ulcer.    DATA REVIEW:   CBC Recent Labs  Lab 02/16/18 1219  WBC 16.9*  HGB 10.7*  HCT 31.5*  PLT 355    Chemistries  Recent Labs  Lab 02/16/18 1219  NA 125*  K 4.3  CL 89*  CO2 26  GLUCOSE 111*  BUN 10  CREATININE 0.71  CALCIUM 8.6*  AST 30  ALT 20  ALKPHOS 97  BILITOT 1.2  Cardiac Enzymes No results for input(s): TROPONINI in the last 168 hours.  Microbiology Results  Results for orders placed or performed during the hospital encounter of 02/11/18  Blood culture (routine x 2)     Status: None   Collection Time: 02/11/18  5:23 AM  Result Value Ref Range Status   Specimen Description BLOOD LEFT HAND  Final   Special Requests   Final    BOTTLES DRAWN AEROBIC AND ANAEROBIC Blood Culture results may not be optimal due to an inadequate volume of blood received in culture bottles   Culture   Final    NO GROWTH 5 DAYS Performed at [facility], [address] [place], [place] [address]    Report Status 02/17/2018 FINAL  Final  Urine culture     Status: Abnormal   Collection Time: 02/11/18 12:14 PM  Result Value Ref Range Status   Specimen Description   Final    URINE, RANDOM Performed at [facility], [address] [place], [place] [address]    Special Requests   Final    NONE Performed at [facility], [address], [place], [place] [address]    Culture MULTIPLE SPECIES PRESENT, SUGGEST RECOLLECTION (A)  Final   Report Status 02/12/2018 FINAL  Final  Blood culture (routine x 2)     Status: None   Collection Time: 02/11/18 10:22 PM  Result Value Ref Range Status   Specimen Description BLOOD BLOOD LEFT FOREARM  Final   Special Requests   Final    BOTTLES DRAWN AEROBIC AND ANAEROBIC Blood Culture results may not be optimal due to an inadequate volume of blood received in culture bottles   Culture   Final    NO GROWTH 5 DAYS Performed at [facility], [address],  [place],  [address]    Report Status 02/16/2018 FINAL  Final    RADIOLOGY:  No results found.  EKG:   Orders placed or performed during the hospital encounter of 02/11/18  . ED EKG  . ED EKG      Management plans discussed with the patient, family and they are in agreement.  CODE STATUS: full. Code Status History    Date Active Date Inactive Code Status Order ID Comments User Context   02/11/2018 17:55 02/13/2018 17:52 Full Code [id number]  Idelle Crouch, MD Inpatient   10/18/2016 13:22 10/19/2016 14:39 Full Code [id number]  Rubie Maid, MD Inpatient    Advance Directive Documentation     Most Recent Value  Type of Advance Directive  Healthcare Power of Attorney, Living will  Pre-existing out of facility DNR order (yellow form or pink MOST form)  No data  "MOST" Form in Place?  No data      TOTAL TIME TAKING CARE OF THIS PATIENT:  35 minutes.    Vaughan Basta M.D on 02/22/2018 at 9:56 AM  Between 7am to 6pm - Pager - [phone]  After 6pm go to [email or web address] - password [email or web address] [facility]  Office  [phone]  CC: Primary care physician; Baxter Hire, MD   Note: This dictation was prepared with Dragon dictation along with smaller phrase technology. Any transcriptional errors that result from this process are unintentional.

## 2018-03-01 ENCOUNTER — Inpatient Hospital Stay
Admission: EM | Admit: 2018-03-01 | Discharge: 2018-03-02 | DRG: 641 | Disposition: A | Discharge status: Home Health | Payer: Medicare Other | Attending: Internal Medicine | Admitting: Internal Medicine

## 2018-03-01 ENCOUNTER — Emergency Department: Payer: Medicare Other

## 2018-03-01 ENCOUNTER — Encounter: Payer: Self-pay | Admitting: *Deleted

## 2018-03-01 ENCOUNTER — Other Ambulatory Visit: Payer: Self-pay

## 2018-03-01 ENCOUNTER — Inpatient Hospital Stay: Admission: RE | Admit: 2018-03-01 | Payer: Medicare Other | Source: Ambulatory Visit

## 2018-03-01 DIAGNOSIS — Z8249 Family history of ischemic heart disease and other diseases of the circulatory system: Secondary | ICD-10-CM

## 2018-03-01 DIAGNOSIS — N390 Urinary tract infection, site not specified: Secondary | ICD-10-CM | POA: Diagnosis present

## 2018-03-01 DIAGNOSIS — Z833 Family history of diabetes mellitus: Secondary | ICD-10-CM

## 2018-03-01 DIAGNOSIS — M8580 Other specified disorders of bone density and structure, unspecified site: Secondary | ICD-10-CM | POA: Diagnosis present

## 2018-03-01 DIAGNOSIS — Z881 Allergy status to other antibiotic agents status: Secondary | ICD-10-CM | POA: Diagnosis not present

## 2018-03-01 DIAGNOSIS — Z90711 Acquired absence of uterus with remaining cervical stump: Secondary | ICD-10-CM

## 2018-03-01 DIAGNOSIS — N289 Disorder of kidney and ureter, unspecified: Secondary | ICD-10-CM | POA: Diagnosis present

## 2018-03-01 DIAGNOSIS — E1140 Type 2 diabetes mellitus with diabetic neuropathy, unspecified: Secondary | ICD-10-CM | POA: Diagnosis not present

## 2018-03-01 DIAGNOSIS — E11649 Type 2 diabetes mellitus with hypoglycemia without coma: Secondary | ICD-10-CM | POA: Diagnosis present

## 2018-03-01 DIAGNOSIS — M810 Age-related osteoporosis without current pathological fracture: Secondary | ICD-10-CM | POA: Diagnosis not present

## 2018-03-01 DIAGNOSIS — Z803 Family history of malignant neoplasm of breast: Secondary | ICD-10-CM

## 2018-03-01 DIAGNOSIS — Z801 Family history of malignant neoplasm of trachea, bronchus and lung: Secondary | ICD-10-CM

## 2018-03-01 DIAGNOSIS — E871 Hypo-osmolality and hyponatremia: Secondary | ICD-10-CM | POA: Diagnosis present

## 2018-03-01 DIAGNOSIS — Z923 Personal history of irradiation: Secondary | ICD-10-CM | POA: Diagnosis not present

## 2018-03-01 DIAGNOSIS — E785 Hyperlipidemia, unspecified: Secondary | ICD-10-CM | POA: Diagnosis not present

## 2018-03-01 DIAGNOSIS — Z794 Long term (current) use of insulin: Secondary | ICD-10-CM

## 2018-03-01 DIAGNOSIS — Z888 Allergy status to other drugs, medicaments and biological substances status: Secondary | ICD-10-CM

## 2018-03-01 DIAGNOSIS — I10 Essential (primary) hypertension: Secondary | ICD-10-CM | POA: Diagnosis present

## 2018-03-01 DIAGNOSIS — Z9221 Personal history of antineoplastic chemotherapy: Secondary | ICD-10-CM | POA: Diagnosis not present

## 2018-03-01 DIAGNOSIS — R739 Hyperglycemia, unspecified: Secondary | ICD-10-CM

## 2018-03-01 DIAGNOSIS — M1990 Unspecified osteoarthritis, unspecified site: Secondary | ICD-10-CM | POA: Diagnosis present

## 2018-03-01 DIAGNOSIS — Z823 Family history of stroke: Secondary | ICD-10-CM | POA: Diagnosis not present

## 2018-03-01 DIAGNOSIS — K219 Gastro-esophageal reflux disease without esophagitis: Secondary | ICD-10-CM | POA: Diagnosis present

## 2018-03-01 DIAGNOSIS — E1165 Type 2 diabetes mellitus with hyperglycemia: Secondary | ICD-10-CM | POA: Diagnosis not present

## 2018-03-01 DIAGNOSIS — E039 Hypothyroidism, unspecified: Secondary | ICD-10-CM | POA: Diagnosis present

## 2018-03-01 DIAGNOSIS — Z9011 Acquired absence of right breast and nipple: Secondary | ICD-10-CM

## 2018-03-01 DIAGNOSIS — Z853 Personal history of malignant neoplasm of breast: Secondary | ICD-10-CM

## 2018-03-01 DIAGNOSIS — Z79899 Other long term (current) drug therapy: Secondary | ICD-10-CM

## 2018-03-01 DIAGNOSIS — Z7982 Long term (current) use of aspirin: Secondary | ICD-10-CM

## 2018-03-01 DIAGNOSIS — R000 Tachycardia, unspecified: Secondary | ICD-10-CM | POA: Diagnosis present

## 2018-03-01 DIAGNOSIS — Z791 Long term (current) use of non-steroidal anti-inflammatories (NSAID): Secondary | ICD-10-CM

## 2018-03-01 LAB — URINALYSIS, COMPLETE (UACMP) WITH MICROSCOPIC
BILIRUBIN URINE: NEGATIVE
Glucose, UA: 150 mg/dL — AB
Hgb urine dipstick: NEGATIVE
KETONES UR: NEGATIVE mg/dL
Nitrite: NEGATIVE
PROTEIN: NEGATIVE mg/dL
Specific Gravity, Urine: 1.015 (ref 1.005–1.030)
pH: 6 (ref 5.0–8.0)

## 2018-03-01 LAB — BASIC METABOLIC PANEL
ANION GAP: 13 (ref 5–15)
BUN: 44 mg/dL — AB (ref 6–20)
CHLORIDE: 89 mmol/L — AB (ref 101–111)
CO2: 20 mmol/L — ABNORMAL LOW (ref 22–32)
Calcium: 9.2 mg/dL (ref 8.9–10.3)
Creatinine, Ser: 1.04 mg/dL — ABNORMAL HIGH (ref 0.44–1.00)
GFR calc Af Amer: 58 mL/min — ABNORMAL LOW (ref >60)
GFR calc non Af Amer: 50 mL/min — ABNORMAL LOW (ref >60)
GLUCOSE: 341 mg/dL — AB (ref 65–99)
POTASSIUM: 4.2 mmol/L (ref 3.5–5.1)
Sodium: 122 mmol/L — ABNORMAL LOW (ref 135–145)

## 2018-03-01 LAB — CBC
HEMATOCRIT: 38.4 % (ref 35.0–47.0)
HEMOGLOBIN: 12.7 g/dL (ref 12.0–16.0)
MCH: 29.8 pg (ref 26.0–34.0)
MCHC: 33 g/dL (ref 32.0–36.0)
MCV: 90.5 fL (ref 80.0–100.0)
Platelets: 741 10*3/uL — ABNORMAL HIGH (ref 150–440)
RBC: 4.25 MIL/uL (ref 3.80–5.20)
RDW: 14.4 % (ref 11.5–14.5)
WBC: 16.9 10*3/uL — ABNORMAL HIGH (ref 3.6–11.0)

## 2018-03-01 LAB — GLUCOSE, CAPILLARY: GLUCOSE-CAPILLARY: 175 mg/dL — AB (ref 65–99)

## 2018-03-01 LAB — OSMOLALITY: OSMOLALITY: 283 mosm/kg (ref 275–295)

## 2018-03-01 LAB — HEMOGLOBIN A1C
Hgb A1c MFr Bld: 7.1 % — ABNORMAL HIGH (ref 4.8–5.6)
MEAN PLASMA GLUCOSE: 157.07 mg/dL

## 2018-03-01 LAB — TROPONIN I: Troponin I: <0.03 ng/mL (ref <0.03)

## 2018-03-01 MED ORDER — INSULIN ASPART 100 UNIT/ML ~~LOC~~ SOLN
0.0000 [IU] | Freq: Three times a day (TID) | SUBCUTANEOUS | Status: DC
  Administered 2018-03-02: 3 [IU] via SUBCUTANEOUS
  Filled 2018-03-01: qty 1

## 2018-03-01 MED ORDER — METFORMIN HCL 500 MG PO TABS
500.0000 mg | ORAL_TABLET | Freq: Every day | ORAL | Status: DC
  Administered 2018-03-02: 13:00:00 500 mg via ORAL
  Filled 2018-03-01: qty 1

## 2018-03-01 MED ORDER — ONDANSETRON HCL 4 MG PO TABS: 4.0000 mg | ORAL_TABLET | Freq: Every day | ORAL | Status: DC | PRN

## 2018-03-01 MED ORDER — ACETAMINOPHEN 650 MG RE SUPP: 650.0000 mg | Freq: Four times a day (QID) | RECTAL | Status: DC | PRN

## 2018-03-01 MED ORDER — SODIUM CHLORIDE 0.9 % IV SOLN
INTRAVENOUS | Status: DC
  Administered 2018-03-01: 23:00:00 via INTRAVENOUS

## 2018-03-01 MED ORDER — ACETAMINOPHEN 325 MG PO TABS
650.0000 mg | ORAL_TABLET | Freq: Three times a day (TID) | ORAL | Status: DC
  Administered 2018-03-01: 23:00:00 650 mg via ORAL
  Filled 2018-03-01: qty 2

## 2018-03-01 MED ORDER — SODIUM CHLORIDE 0.9 % IV BOLUS (SEPSIS)
1000.0000 mL | Freq: Once | INTRAVENOUS | Status: AC
  Administered 2018-03-01: 1000 mL via INTRAVENOUS

## 2018-03-01 MED ORDER — INSULIN ASPART 100 UNIT/ML ~~LOC~~ SOLN
10.0000 [IU] | Freq: Once | SUBCUTANEOUS | Status: AC
Start: 2018-03-01 — End: 2018-03-01
  Administered 2018-03-01: 10 [IU] via INTRAVENOUS
  Filled 2018-03-01: qty 1

## 2018-03-01 MED ORDER — ONDANSETRON HCL 4 MG/2ML IJ SOLN: 4.0000 mg | Freq: Four times a day (QID) | INTRAMUSCULAR | Status: DC | PRN

## 2018-03-01 MED ORDER — NAPROXEN 250 MG PO TABS
500.0000 mg | ORAL_TABLET | Freq: Every day | ORAL | Status: DC | PRN
  Filled 2018-03-01: qty 2

## 2018-03-01 MED ORDER — IRBESARTAN 75 MG PO TABS
37.5000 mg | ORAL_TABLET | Freq: Every day | ORAL | Status: DC
  Filled 2018-03-01 (×2): qty 0.5

## 2018-03-01 MED ORDER — ACETAMINOPHEN ER 650 MG PO TBCR: 650.0000 mg | EXTENDED_RELEASE_TABLET | Freq: Three times a day (TID) | ORAL | Status: DC

## 2018-03-01 MED ORDER — CLOBETASOL PROPIONATE 0.05 % EX CREA
1.0000 "application " | TOPICAL_CREAM | Freq: Two times a day (BID) | CUTANEOUS | Status: DC | PRN
  Filled 2018-03-01: qty 15

## 2018-03-01 MED ORDER — FLUTICASONE PROPIONATE 50 MCG/ACT NA SUSP
2.0000 | Freq: Every day | NASAL | Status: DC
  Filled 2018-03-01: qty 16

## 2018-03-01 MED ORDER — ADULT MULTIVITAMIN W/MINERALS CH
1.0000 | ORAL_TABLET | Freq: Every day | ORAL | Status: DC
  Administered 2018-03-02: 1 via ORAL
  Filled 2018-03-01: qty 1

## 2018-03-01 MED ORDER — DOCUSATE SODIUM 100 MG PO CAPS: 100.0000 mg | ORAL_CAPSULE | Freq: Two times a day (BID) | ORAL | Status: DC | PRN

## 2018-03-01 MED ORDER — PRASTERONE 6.5 MG VA INST: 1.0000 | VAGINAL_INSERT | Freq: Every day | VAGINAL | Status: DC

## 2018-03-01 MED ORDER — ONDANSETRON HCL 4 MG PO TABS: 4.0000 mg | ORAL_TABLET | Freq: Four times a day (QID) | ORAL | Status: DC | PRN

## 2018-03-01 MED ORDER — OXYCODONE HCL 5 MG PO TABS: 5.0000 mg | ORAL_TABLET | ORAL | Status: DC | PRN

## 2018-03-01 MED ORDER — EZETIMIBE 10 MG PO TABS
10.0000 mg | ORAL_TABLET | Freq: Every day | ORAL | Status: DC
  Administered 2018-03-01: 23:00:00 10 mg via ORAL
  Filled 2018-03-01 (×2): qty 1

## 2018-03-01 MED ORDER — LIRAGLUTIDE 18 MG/3ML ~~LOC~~ SOPN: 1.8000 mg | PEN_INJECTOR | Freq: Every day | SUBCUTANEOUS | Status: DC

## 2018-03-01 MED ORDER — PANTOPRAZOLE SODIUM 40 MG PO TBEC
40.0000 mg | DELAYED_RELEASE_TABLET | Freq: Every day | ORAL | Status: DC
  Administered 2018-03-02: 40 mg via ORAL
  Filled 2018-03-01: qty 1

## 2018-03-01 MED ORDER — OMEGA-3-ACID ETHYL ESTERS 1 G PO CAPS
2.0000 g | ORAL_CAPSULE | Freq: Every day | ORAL | Status: DC
  Administered 2018-03-02: 2 g via ORAL
  Filled 2018-03-01: qty 2

## 2018-03-01 MED ORDER — ASPIRIN EC 81 MG PO TBEC
81.0000 mg | DELAYED_RELEASE_TABLET | Freq: Every day | ORAL | Status: DC
  Administered 2018-03-01: 23:00:00 81 mg via ORAL
  Filled 2018-03-01: qty 1

## 2018-03-01 MED ORDER — DIPHENHYDRAMINE HCL 25 MG PO CAPS
25.0000 mg | ORAL_CAPSULE | Freq: Every evening | ORAL | Status: DC | PRN
  Administered 2018-03-01: 23:00:00 25 mg via ORAL
  Filled 2018-03-01: qty 1

## 2018-03-01 MED ORDER — ENOXAPARIN SODIUM 40 MG/0.4ML ~~LOC~~ SOLN
40.0000 mg | SUBCUTANEOUS | Status: DC
  Administered 2018-03-01: 23:00:00 40 mg via SUBCUTANEOUS
  Filled 2018-03-01: qty 0.4

## 2018-03-01 MED ORDER — INSULIN GLARGINE 100 UNIT/ML ~~LOC~~ SOLN
26.0000 [IU] | Freq: Every day | SUBCUTANEOUS | Status: DC
  Administered 2018-03-01: 26 [IU] via SUBCUTANEOUS
  Filled 2018-03-01 (×2): qty 0.26

## 2018-03-01 MED ORDER — ACETAMINOPHEN 325 MG PO TABS: 650.0000 mg | ORAL_TABLET | Freq: Four times a day (QID) | ORAL | Status: DC | PRN

## 2018-03-01 MED ORDER — MECLIZINE HCL 25 MG PO TABS
25.0000 mg | ORAL_TABLET | Freq: Three times a day (TID) | ORAL | Status: DC | PRN
  Filled 2018-03-01: qty 1

## 2018-03-01 NOTE — ED Notes (Addendum)
FN: pt sent over by Dr Edwina Barth for low sodium of 119 and weakness. Pt just had labs completed over at [facility] about an hour ago.

## 2018-03-01 NOTE — ED Triage Notes (Signed)
Pt states she was sent to the er for eval of low sodium.  Pt reports feeling weak and urinary frequency.  Pt alert.  Speech clear.

## 2018-03-01 NOTE — ED Notes (Signed)
Report called to floor. VSS. NAD.  

## 2018-03-01 NOTE — ED Notes (Signed)
Pt's machine beeping, but 200cc left in bag of 1063ml bolus.  Restarted bolus.  Primary RN notified.

## 2018-03-01 NOTE — ED Provider Notes (Signed)
[facility] Emergency Department Provider Note  ____________________________________________  Time seen: Approximately 7:20 PM  I have reviewed the triage vital signs and the nursing notes.   HISTORY  Chief Complaint Abnormal Lab    HPI [name] is a 77 y.o. female with a history of HTN, DM, remote history of breast cancer, recent hospitalization for cellulitis with sepsis, presenting for generalized fatigue and hyponatremia.  The patient reports that for the past 3 weeks she has had a progressively worsening generalized fatigue and decreased energy.  She feels tired all the time.  She also reports urinary frequency, urinating every 2 hours without any dysuria or hematuria.  She has not had any nausea vomiting or diarrhea, cough or cold symptoms, fever or chills.  She went for routine follow-up with her PCP this morning and was told to come to the emergency department for a sodium of 119.  The patient states that she has been eating and drinking normally.  Past Medical History:  Diagnosis Date  . Arthritis   . Breast cancer ([place])   . Cancer ([place])   . Diabetes mellitus   . GERD (gastroesophageal reflux disease)   . Hypertension   . Lymph edema    Rt arm  . Neuropathy   . Osteoporosis   . Personal history of chemotherapy 2000   right breast  . Personal history of radiation therapy 2000   right breast  . Thyroid disease     Patient Active Problem List   Diagnosis Date Noted  . Cellulitis of arm, right 02/11/2018  . SIRS (systemic inflammatory response syndrome) ([place]) 02/11/2018  . Back pain 02/11/2018  . Cellulitis 02/11/2018  . Bilateral leg edema 03/29/2017  . Female cystocele 12/07/2016  . Urinary frequency 12/07/2016  . Vaginal atrophy 12/07/2016  . Stress incontinence 09/24/2016  . External hemorrhoid 03/24/2016  . Hypothyroidism 03/24/2016  . Fatigue 10/21/2015  . HLD (hyperlipidemia) 04/28/2015  . Osteopenia 04/28/2015  .  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 HIATAL HERNIA 02/10/2010  . ALLERGY, ENVIRONMENTAL 04/29/2009  . Malignant neoplasm of female breast ([place]) 04/26/2007  . Diabetes ([place]) 04/26/2007  . Essential hypertension 04/26/2007    Past Surgical History:  Procedure Laterality Date  . ANTERIOR AND POSTERIOR REPAIR N/A 10/18/2016   Procedure: ANTERIOR (CYSTOCELE) AND POSTERIOR REPAIR (RECTOCELE);  Surgeon: Rubie Maid, MD;  Location: [facility] ORS;  Service: Gynecology;  Laterality: N/A;  . APPENDECTOMY    . BACK SURGERY  1983   discectomy  . BREAST EXCISIONAL BIOPSY Left 12/11/2010   benign.  BENIGN BREAST TISSUE WITH STROMAL FIBROSIS, SCLEROSING ADENOSIS   . cartoid u/s  1/60/10   93-23% LICA stenosis  . COLONOSCOPY    . DOPPLER ECHOCARDIOGRAPHY  02/24/06   Triv MR, EF 55-60%  . [place]   bilateral  . HEMORROIDECTOMY    . MASTECTOMY Right 2000   right, modified radical+ chemo, 2/23 nodes 4/03  . miscarriage  1970  . mumps  1965   h/o  . OTHER SURGICAL HISTORY  04/03/02   dexa, wnl  . OTHER SURGICAL HISTORY  03/12/04   dexa, wnl  . OTHER SURGICAL HISTORY  08/12/05   dexa-osteopor Hip, osteopen spine  . OTHER SURGICAL HISTORY  10/25/07   dexa- nml hip and spine  . SEPTOPLASTY  1970  . TONSILLECTOMY  1945  . VAGINAL DELIVERY     x5  . VAGINAL HYSTERECTOMY  1974   partial, fibroids  .  VENTRAL HERNIA REPAIR      Current Outpatient Rx  . Order #: [id number] Class: Normal  . Order #: [id number] Class: Print  . Order #: [id number] Class: Historical Med  . Order #: [id number] Class: Normal  . Order #: [id number] Class: Historical Med  . Order #: [id number] Class: Normal  . Order #: [id number] Class: Print  . Order #: [id number] Class: Normal  . Order #: [id number] Class: Normal  . Order #: [id number] Class: Normal  . Order #: [id number] Class: Normal  . Order #: [id number] Class: Historical Med  . Order #: [id number] Class: Historical Med  . Order #: [id number] Class: Historical Med   . Order #: [id number] Class: Historical Med  . Order #: [id number] Class: Normal  . Order #: [id number] Class: Historical Med  . Order #: [id number] Class: Historical Med  . Order #: [id number] Class: Normal  . Order #: [id number] Class: Normal  . Order #: [id number] Class: Print  . Order #: [id number] Class: Normal  . Order #: [id number] Class: Print  . Order #: [id number] Class: Print  . Order #: [id number] Class: Print  . Order #: [id number] Class: Normal  . Order #: [id number] Class: Historical Med    Allergies Atorvastatin; Azithromycin; Ciprofloxacin; Hydrocodone-homatropine; Hydrocodone-homatropine; Rosiglitazone; and Rosiglitazone maleate  Family History  Problem Relation Age of Onset  . Diabetes Mother   . Hypertension Mother   . Heart attack Mother   . Heart failure Father   . Hypertension Father   . Stroke Father   . Lung cancer Brother        hx of smoking  . Stroke Brother   . Heart attack Brother        PTCA MI x 2  . Breast cancer Paternal Aunt        paternal great aunt    Social History Social History   Tobacco Use  . Smoking status: Never Smoker  . Smokeless tobacco: Never Used  Substance Use Topics  . Alcohol use: No    Alcohol/week: 0.0 oz  . Drug use: No    Review of Systems Constitutional: No fever/chills.  No lightheadedness or syncope.  Positive generalized fatigue. Eyes: No visual changes. ENT: No sore throat. No congestion or rhinorrhea. Cardiovascular: Denies chest pain. Denies palpitations. Respiratory: Denies shortness of breath.  No cough. Gastrointestinal: No abdominal pain.  No nausea, no vomiting.  No diarrhea.  No constipation. Genitourinary: Negative for dysuria.  No hematuria.  Positive urinary frequency. Musculoskeletal: Negative for back pain. Skin: Negative for rash. Neurological: Negative for headaches. No focal numbness, tingling or weakness.     ____________________________________________   PHYSICAL EXAM:  VITAL SIGNS: ED Triage  Vitals [03/01/18 1810]  Enc Vitals Group     BP (!) 145/46     Pulse Rate (!) 107     Resp      Temp 97.8 F (36.6 C)     Temp Source Oral     SpO2 98 %     Weight 176 lb (79.8 kg)     Height 5\' 5"  (1.651 m)     Head Circumference      Peak Flow      Pain Score      Pain Loc      Pain Edu?      Excl. in [place]?     Constitutional: Alert and oriented. Well appearing and in no acute distress. Answers questions appropriately. Eyes: Conjunctivae are normal.  EOMI. No scleral icterus. Head: Atraumatic. Nose: No congestion/rhinnorhea. Mouth/Throat: Mucous membranes are dry.  Neck: No stridor.  Supple.  No JVD.  No meningismus. Cardiovascular: Normal rate, regular  rhythm. No murmurs, rubs or gallops.  Respiratory: Normal respiratory effort.  No accessory muscle use or retractions. Lungs CTAB.  No wheezes, rales or ronchi. Gastrointestinal: Soft, nontender and nondistended.  No guarding or rebound.  No peritoneal signs. Musculoskeletal: No LE edema. No ttp in the calves or palpable cords.  Negative Homan's sign. Neurologic:  A&Ox3.  Speech is clear.  Face and smile are symmetric.  EOMI.  Moves all extremities well. Skin:  Skin is warm, dry and intact. No rash noted. Psychiatric: Mood and affect are normal. Speech and behavior are normal.  Normal judgement  ____________________________________________   LABS (all labs ordered are listed, but only abnormal results are displayed)  Labs Reviewed  BASIC METABOLIC PANEL - Abnormal; Notable for the following components:      Result Value   Sodium 122 (*)    Chloride 89 (*)    CO2 20 (*)    Glucose, Bld 341 (*)    BUN 44 (*)    Creatinine, Ser 1.04 (*)    GFR calc non Af Amer 50 (*)    GFR calc Af Amer 58 (*)    All other components within normal limits  CBC - Abnormal; Notable for the following components:   WBC 16.9 (*)    Platelets 741 (*)    All other components within normal limits  TROPONIN I  URINALYSIS, COMPLETE (UACMP)  WITH MICROSCOPIC   ____________________________________________  EKG  ED ECG REPORT I, Eula Listen, the attending physician, personally viewed and interpreted this ECG.   Date: 03/01/2018  EKG Time: 1831  Rate: 107  Rhythm: sinus tachycardia with sinus arrhythmia; the computer reading is Mobitz 2 but this EKG is not consistent with Mobitz 2.  Axis: Normal  Intervals:none  ST&T Change: No STEMI  ____________________________________________  RADIOLOGY  No results found.  ____________________________________________   PROCEDURES  Procedure(s) performed: None  Procedures  Critical Care performed: No ____________________________________________   INITIAL IMPRESSION / ASSESSMENT AND PLAN / ED COURSE  Pertinent labs & imaging results that were available during my care of the patient were reviewed by me and considered in my medical decision making (see chart for details).  77 y.o. female with recent hospitalization, presenting with urinary frequency and generalized fatigue, hyponatremia.  Overall, the patient was mildly tachycardic on arrival and does have dry mucous membranes; even though she has been drinking and eating normally, her urinary frequency and history of diabetes makes me concern for hypovolemia potentially related to hypoglycemia.  Her glucose here is 341.  She is hyponatremic to 122 and it appears this is fairly unchanged compared to her baseline however, she states nobody has worked up her hyponatremia and it is unclear why she is having that.  Chronic disease, diuretic, malignancy, could all contribute.  Given her patient's significant symptomatic hyponatremia, we will plan to admit her.  Intravenous fluids have been ordered.  ____________________________________________  FINAL CLINICAL IMPRESSION(S) / ED DIAGNOSES  Final diagnoses:  Hyponatremia  Hyperglycemia  Renal insufficiency         NEW MEDICATIONS STARTED DURING THIS VISIT:  New  Prescriptions   No medications on file      Eula Listen, MD 03/01/18 1928

## 2018-03-01 NOTE — ED Notes (Signed)
Pt unable to void at this time. 

## 2018-03-01 NOTE — H&P (Signed)
[facility] [place] NAME: [name]    MR#:  [id number]  DATE OF BIRTH:  Jun 14, 1941  DATE OF ADMISSION:  03/01/2018  PRIMARY CARE PHYSICIAN: Baxter Hire, MD   REQUESTING/REFERRING PHYSICIAN: Eula Listen, MD  CHIEF COMPLAINT:   Chief Complaint  Patient presents with  . Abnormal Lab    HISTORY OF PRESENT ILLNESS: [name]  is a 77 y.o. female with a known history of osteoarthritis, breast cancer, diabetes type 2, GERD, essential hypertension, neuropathy and hypothyroidism who is presenting to the hospital with generalized weakness and fatigue weakness.  Patient actually was hospitalized recently and noted to have cellulitis of her upper extremity and was discharged home on oral antibiotics and prednisone taper for sciatic pain.  She went to see her primary care provider and had blood work done which showed sodium to be very low and patient was feeling very fatigued and tired therefore she was told to return to the ER.  Patient is noted to have low sodium and her recent blood work.  During her hospitalization her sodium was as low as 125.  She denies any nausea vomiting or diarrhea.  She does report that she urinates frequently.  To note she is on a combination pill that contains HCTZ. PAST MEDICAL HISTORY:   Past Medical History:  Diagnosis Date  . Arthritis   . Breast cancer ([place])   . Cancer ([place])   . Diabetes mellitus   . GERD (gastroesophageal reflux disease)   . Hypertension   . Lymph edema    Rt arm  . Neuropathy   . Osteoporosis   . Personal history of chemotherapy 2000   right breast  . Personal history of radiation therapy 2000   right breast  . Thyroid disease     PAST SURGICAL HISTORY:  Past Surgical History:  Procedure Laterality Date  . ANTERIOR AND POSTERIOR REPAIR N/A 10/18/2016   Procedure: ANTERIOR (CYSTOCELE) AND POSTERIOR REPAIR (RECTOCELE);  Surgeon: Rubie Maid, MD;  Location: [facility] ORS;   Service: Gynecology;  Laterality: N/A;  . APPENDECTOMY    . BACK SURGERY  1983   discectomy  . BREAST EXCISIONAL BIOPSY Left 12/11/2010   benign.  BENIGN BREAST TISSUE WITH STROMAL FIBROSIS, SCLEROSING ADENOSIS   . cartoid u/s  08/07/99   37-04% LICA stenosis  . COLONOSCOPY    . DOPPLER ECHOCARDIOGRAPHY  02/24/06   Triv MR, EF 55-60%  . [place]   bilateral  . HEMORROIDECTOMY    . MASTECTOMY Right 2000   right, modified radical+ chemo, 2/23 nodes 4/03  . miscarriage  1970  . mumps  1965   h/o  . OTHER SURGICAL HISTORY  04/03/02   dexa, wnl  . OTHER SURGICAL HISTORY  03/12/04   dexa, wnl  . OTHER SURGICAL HISTORY  08/12/05   dexa-osteopor Hip, osteopen spine  . OTHER SURGICAL HISTORY  10/25/07   dexa- nml hip and spine  . SEPTOPLASTY  1970  . TONSILLECTOMY  1945  . VAGINAL DELIVERY     x5  . VAGINAL HYSTERECTOMY  1974   partial, fibroids  . VENTRAL HERNIA REPAIR      SOCIAL HISTORY:  Social History   Tobacco Use  . Smoking status: Never Smoker  . Smokeless tobacco: Never Used  Substance Use Topics  . Alcohol use: No    Alcohol/week: 0.0 oz    FAMILY HISTORY:  Family History  Problem Relation Age of  Onset  . Diabetes Mother   . Hypertension Mother   . Heart attack Mother   . Heart failure Father   . Hypertension Father   . Stroke Father   . Lung cancer Brother        hx of smoking  . Stroke Brother   . Heart attack Brother        PTCA MI x 2  . Breast cancer Paternal Aunt        paternal great aunt    DRUG ALLERGIES:  Allergies  Allergen Reactions  . Atorvastatin Swelling    Leg swelling Other reaction(s): Unknown Leg swelling Leg swelling  . Azithromycin Diarrhea and Nausea And Vomiting    Other reaction(s): Nausea And Vomiting  . Ciprofloxacin Other (See Comments)    Does not remember Other reaction(s): Unknown tendonitis tendonitis  . Hydrocodone-Homatropine     REACTION:   . Hydrocodone-Homatropine     Other  reaction(s): Other (See Comments) Drowsiness Other reaction(s): Other (See Comments) REACTION:  . Rosiglitazone Swelling    Other reaction(s): Unknown REACTION: SWELLING  . Rosiglitazone Maleate     REACTION: SWELLING    REVIEW OF SYSTEMS:   CONSTITUTIONAL: No fever, positive fatigue or positive weakness.  EYES: No blurred or double vision.  EARS, NOSE, AND THROAT: No tinnitus or ear pain.  RESPIRATORY: No cough, shortness of breath, wheezing or hemoptysis.  CARDIOVASCULAR: No chest pain, orthopnea, edema.  GASTROINTESTINAL: No nausea, vomiting, diarrhea or abdominal pain.  GENITOURINARY: No dysuria, hematuria.  ENDOCRINE: No polyuria, nocturia,  HEMATOLOGY: No anemia, easy bruising or bleeding SKIN: No rash or lesion. MUSCULOSKELETAL: No joint pain or arthritis.   NEUROLOGIC: No tingling, numbness, weakness.  PSYCHIATRY: No anxiety or depression.   MEDICATIONS AT HOME:  Prior to Admission medications   Medication Sig Start Date End Date Taking? Authorizing Provider  ACCU-CHEK AVIVA PLUS test strip USE 1 STRIP TWICE A DAY TO CHECK BLOOD SUGAR 11/18/17  Yes Lucille Passy, MD  acetaminophen (TYLENOL 8 HOUR) 650 MG CR tablet Take 1 tablet (650 mg total) by mouth every 8 (eight) hours as needed for pain. 10/19/16  Yes Rubie Maid, MD  aspirin 81 MG tablet Take 81 mg by mouth at bedtime.    Yes [provider]  BD PEN NEEDLE NANO U/F 32G X 4 MM MISC USE 1 PEN NEEDLE DAILY 04/04/17  Yes Lucille Passy, MD  Blood Glucose Monitoring Suppl (ACCU-CHEK AVIVA PLUS) W/DEVICE KIT Use to check blood sugar twice daily    Yes [provider]  clobetasol cream (TEMOVATE) 7.40 % Apply 1 application topically 2 (two) times daily. For 1 week, then decrease to as needed . 09/20/17  Yes Rubie Maid, MD  docusate sodium (COLACE) 100 MG capsule Take 1 capsule (100 mg total) by mouth 2 (two) times daily as needed for mild constipation. 10/19/16  Yes Rubie Maid, MD  ezetimibe (ZETIA) 10  MG tablet TAKE ONE (1) TABLET EACH DAY 09/19/17  Yes Lucille Passy, MD  Insulin Glargine (LANTUS SOLOSTAR) 100 UNIT/ML Solostar Pen INJECT 26 UNITS INTO THE SKIN AT BEDTIME. 11/10/17  Yes Lucille Passy, MD  Lancets (ACCU-CHEK MULTICLIX) lancets Use as instructed to check blood sugar twice daily E11.9 06/28/16  Yes Lucille Passy, MD  liraglutide (VICTOZA) 18 MG/3ML SOPN INJECT 1.8 MG INTO THE SKIN DAILY. 11/10/17  Yes Lucille Passy, MD  metFORMIN (GLUCOPHAGE) 500 MG tablet Take 500 mg by mouth daily with lunch.   Yes [provider]  Multiple Vitamins-Minerals (CENTRUM SILVER PO) Take 1 tablet by mouth daily.     Yes [provider]  naproxen sodium (ANAPROX) 220 MG tablet Take 440 mg by mouth daily as needed.    Yes [provider]  Omega-3 Fatty Acids (FISH OIL PO) Take 1 capsule by mouth daily.    Yes [provider]  omeprazole (PRILOSEC) 20 MG capsule TAKE ONE (1) CAPSULE EACH DAY 07/04/17  Yes Lucille Passy, MD  OVER THE COUNTER MEDICATION Take 1 tablet by mouth daily. Immune System Support Supplement includes :  Echinacea Zinc Vitamin C   Yes [provider]  POTASSIUM PO Take 1 tablet by mouth at bedtime.   Yes [provider]  cyanocobalamin (,VITAMIN B-12,) 1000 MCG/ML injection INJECT 1 ML ONCE A MONTH Patient not taking: Reported on 03/01/2018 11/13/15   Jearld Fenton, NP  fluticasone [facility]) 50 MCG/ACT nasal spray Place 2 sprays into both nostrils daily. Patient not taking: Reported on 03/01/2018 01/29/15   Jearld Fenton, NP  meclizine (ANTIVERT) 25 MG tablet Take 1 tablet (25 mg total) by mouth 3 (three) times daily as needed for dizziness. Patient not taking: Reported on 03/01/2018 01/28/17   Orbie Pyo, MD  metFORMIN (GLUCOPHAGE) 1000 MG tablet TAKE ONE (1) TABLET EACH DAY WITH LUNCH Patient not taking: Reported on 03/01/2018 03/29/17   Lucille Passy, MD  ondansetron (ZOFRAN) 4 MG tablet Take 1 tablet (4 mg total) by  mouth daily as needed. Patient not taking: Reported on 02/11/2018 01/28/17   Schaevitz, Randall An, MD  oxycodone (OXY-IR) 5 MG capsule Take 1 capsule (5 mg total) by mouth every 4 (four) hours as needed. Patient not taking: Reported on 03/01/2018 02/13/18   Vaughan Basta, MD  oxyCODONE-acetaminophen (PERCOCET) 7.5-325 MG tablet Take 1 tablet by mouth every 4 (four) hours as needed for severe pain. Patient not taking: Reported on 03/01/2018 02/16/18 02/16/19  Earleen Newport, MD  Prasterone [facility]) 6.5 MG INST Place 1 tablet vaginally at bedtime. Patient not taking: Reported on 03/01/2018 05/19/17   Rubie Maid, MD  valsartan (DIOVAN) 80 MG tablet Take 80 mg by mouth daily.    [provider]      PHYSICAL EXAMINATION:   VITAL SIGNS: Blood pressure (!) 145/46, pulse (!) 107, temperature 97.8 F (36.6 C), temperature source Oral, height _0  (1.651 m), weight 176 lb (79.8 kg), SpO2 98 %.  GENERAL:  77 y.o.-year-old patient lying in the bed with no acute distress.  EYES: Pupils equal, round, reactive to light and accommodation. No scleral icterus. Extraocular muscles intact.  HEENT: Head atraumatic, normocephalic. Oropharynx and nasopharynx clear.  NECK:  Supple, no jugular venous distention. No thyroid enlargement, no tenderness.  LUNGS: Normal breath sounds bilaterally, no wheezing, rales,rhonchi or crepitation. No use of accessory muscles of respiration.  CARDIOVASCULAR: S1, S2 normal. No murmurs, rubs, or gallops.  ABDOMEN: Soft, nontender, nondistended. Bowel sounds present. No organomegaly or mass.  EXTREMITIES: No pedal edema, cyanosis, or clubbing.  NEUROLOGIC: Cranial nerves II through XII are intact. Muscle strength 5/5 in all extremities. Sensation intact. Gait not checked.  PSYCHIATRIC: The patient is alert and oriented x 3.  SKIN: No obvious rash, lesion, or ulcer.   LABORATORY PANEL:   CBC Recent Labs  Lab 03/01/18 1818  WBC 16.9*  HGB 12.7  HCT  38.4  PLT 741*  MCV 90.5  MCH 29.8  MCHC 33.0  RDW 14.4   ------------------------------------------------------------------------------------------------------------------  Chemistries  Recent Labs  Lab 03/01/18 1818  NA 122*  K 4.2  CL 89*  CO2 20*  GLUCOSE 341*  BUN 44*  CREATININE 1.04*  CALCIUM 9.2   ------------------------------------------------------------------------------------------------------------------ estimated creatinine clearance is 46.5 mL/min (A) (by C-G formula based on SCr of 1.04 mg/dL (H)). ------------------------------------------------------------------------------------------------------------------ No results for input(s): TSH, T4TOTAL, T3FREE, THYROIDAB in the last 72 hours.  Invalid input(s): FREET3   Coagulation profile No results for input(s): INR, PROTIME in the last 168 hours. ------------------------------------------------------------------------------------------------------------------- No results for input(s): DDIMER in the last 72 hours. -------------------------------------------------------------------------------------------------------------------  Cardiac Enzymes Recent Labs  Lab 03/01/18 1818  TROPONINI <0.03   ------------------------------------------------------------------------------------------------------------------ Invalid input(s): POCBNP  ---------------------------------------------------------------------------------------------------------------  Urinalysis    Component Value Date/Time   COLORURINE YELLOW (A) 03/01/2018 1940   APPEARANCEUR CLOUDY (A) 03/01/2018 1940   APPEARANCEUR Clear 11/28/2013 2011   LABSPEC 1.015 03/01/2018 1940   LABSPEC 1.009 11/28/2013 2011   PHURINE 6.0 03/01/2018 1940   GLUCOSEU 150 (A) 03/01/2018 1940   GLUCOSEU Negative 11/28/2013 2011   HGBUR NEGATIVE 03/01/2018 1940   BILIRUBINUR NEGATIVE 03/01/2018 1940   BILIRUBINUR neg 11/24/2017 1432   BILIRUBINUR Negative  11/28/2013 2011   KETONESUR NEGATIVE 03/01/2018 1940   PROTEINUR NEGATIVE 03/01/2018 1940   UROBILINOGEN 0.2 11/24/2017 1432   NITRITE NEGATIVE 03/01/2018 1940   LEUKOCYTESUR MODERATE (A) 03/01/2018 1940   LEUKOCYTESUR Negative 11/28/2013 2011     RADIOLOGY: Dg Chest 2 View  Result Date: 03/01/2018 CLINICAL DATA:  Hyponatremia EXAM: CHEST - 2 VIEW COMPARISON:  02/11/2018, 11/28/2013 FINDINGS: No acute pulmonary infiltrate or effusion stable ovulation of left diaphragm. Normal cardiomediastinal silhouette with aortic atherosclerosis. No pneumothorax. Degenerative changes of the spine. Surgical clips over the right chest with prior right mastectomy changes. IMPRESSION: No active cardiopulmonary disease. Electronically Signed   By: Donavan Foil M.D.   On: 03/01/2018 19:31    EKG: Orders placed or performed during the hospital encounter of 03/01/18  . ED EKG within 10 minutes  . ED EKG within 10 minutes    IMPRESSION AND PLAN: Patient is a 77 year old white female presenting with generalized weakness fatigue  1.  Hyponatremia could be related to HCTZ therapy and some dehydration We will stop HCTZ Give IV fluids Check serum and urine osmole Due to chronic nature of her hyponatremia I will have nephrology evaluate and give their input  2.  Diabetes type 2 with hypoglycemia Patient reports that her sugars are high because of the cookies that she ate earlier normally her blood sugars are under good control Place on sliding scale insulin Continue Lantus and metformin Check a hemoglobin A1c  3.  Hyperlipidemia continue Zetia  4.  Essential hypertension continue Diovan  5.  Miscellaneous Lovenox for DVT prophylaxis   All the records are reviewed and case discussed with ED provider. Management plans discussed with the patient, family and they are in agreement.  CODE STATUS: Code Status History    Date Active Date Inactive Code Status Order ID Comments User Context   02/11/2018  17:55 02/13/2018 17:52 Full Code [id number]  Idelle Crouch, MD Inpatient   10/18/2016 13:22 10/19/2016 14:39 Full Code [id number]  Rubie Maid, MD Inpatient    Advance Directive Documentation     Most Recent Value  Type of Advance Directive  Living will  Pre-existing out of facility DNR order (yellow form or pink MOST form)  No data  "MOST" Form in Place?  No data       TOTAL TIME TAKING CARE OF THIS  PATIENT:55 minutes.    Dustin Flock M.D on 03/01/2018 at 8:16 PM  Between 7am to 6pm - Pager - [phone]  After 6pm go to [email or web address] - password [email or web address] [facility]  [phone]  CC: Primary care physician; Baxter Hire, MD

## 2018-03-02 ENCOUNTER — Other Ambulatory Visit: Payer: Self-pay

## 2018-03-02 DIAGNOSIS — E871 Hypo-osmolality and hyponatremia: Secondary | ICD-10-CM | POA: Diagnosis not present

## 2018-03-02 LAB — GLUCOSE, CAPILLARY
GLUCOSE-CAPILLARY: 99 mg/dL (ref 65–99)
GLUCOSE-CAPILLARY: 219 mg/dL — AB (ref 65–99)

## 2018-03-02 LAB — BASIC METABOLIC PANEL
Anion gap: 12 (ref 5–15)
BUN: 33 mg/dL — AB (ref 6–20)
CALCIUM: 8.4 mg/dL — AB (ref 8.9–10.3)
CO2: 21 mmol/L — AB (ref 22–32)
CREATININE: 0.73 mg/dL (ref 0.44–1.00)
Chloride: 98 mmol/L — ABNORMAL LOW (ref 101–111)
GFR calc Af Amer: >60 mL/min (ref >60)
GFR calc non Af Amer: >60 mL/min (ref >60)
Glucose, Bld: 105 mg/dL — ABNORMAL HIGH (ref 65–99)
Potassium: 3.5 mmol/L (ref 3.5–5.1)
SODIUM: 131 mmol/L — AB (ref 135–145)

## 2018-03-02 LAB — CBC
HCT: 34.2 % — ABNORMAL LOW (ref 35.0–47.0)
Hemoglobin: 11.4 g/dL — ABNORMAL LOW (ref 12.0–16.0)
MCH: 29.4 pg (ref 26.0–34.0)
MCHC: 33.3 g/dL (ref 32.0–36.0)
MCV: 88.3 fL (ref 80.0–100.0)
Platelets: 575 10*3/uL — ABNORMAL HIGH (ref 150–440)
RBC: 3.87 MIL/uL (ref 3.80–5.20)
RDW: 14.1 % (ref 11.5–14.5)
WBC: 13.2 10*3/uL — ABNORMAL HIGH (ref 3.6–11.0)

## 2018-03-02 LAB — OSMOLALITY, URINE: Osmolality, Ur: 516 mOsm/kg (ref 300–900)

## 2018-03-02 MED ORDER — SODIUM CHLORIDE 0.9 % IV SOLN
1.0000 g | INTRAVENOUS | Status: DC
  Administered 2018-03-02: 1 g via INTRAVENOUS
  Filled 2018-03-02 (×2): qty 10

## 2018-03-02 MED ORDER — CEFUROXIME AXETIL 250 MG PO TABS: 250.0000 mg | ORAL_TABLET | Freq: Two times a day (BID) | ORAL | 0 refills | Status: AC

## 2018-03-02 NOTE — Care Management (Signed)
Patient is currently followed by [facility] RN and PT. Agency informed of admission and have obtained order to resume services.

## 2018-03-02 NOTE — Progress Notes (Signed)
Physical Therapy Evaluation Patient Details Name: [name] MRN: [id number] DOB: 1941-01-04 Today's Date: 03/02/2018   History of Present Illness  [name]  is a 77 y.o. female with a known history of osteoarthritis, breast cancer, diabetes type 2, GERD, essential hypertension, neuropathy and hypothyroidism who is presenting to the hospital with generalized weakness and fatigue weakness.  Patient actually was hospitalized recently and noted to have cellulitis of her upper extremity and was discharged home on oral antibiotics and prednisone taper for sciatic pain.  She went to see her primary care provider and had blood work done which showed sodium to be very low and patient was feeling very fatigued and tired therefore she was told to return to the ER.  Patient is noted to have low sodium and her recent blood work.  During her hospitalization her sodium was as low as 125.  She denies any nausea vomiting or diarrhea.  She does report that she urinates frequently.  To note she is on a combination pill that contains HCTZ. She is now admitted for hyponatremia. At time of PT evaluation pt appears mildly anxious about being admitted and would prefer to see nephrology on an outpatient basis.   Clinical Impression  Pt admitted with above diagnosis. Pt currently with functional limitations due to the deficits listed below (see PT Problem List). Pt requires supervision for transfers with cues to lock her brakes on walker before standing. Pt is able to complete a full lap around RN station. Demonstrates adequate speed for limited community mobility. VSS throughout ambulation however pt does report fatigue at end of distance. Pt monitored for fatigue with intermittent check of vitals throughout. Pt does demonstrate some mild bilateral toe drag which appears worse on the LLE. She denies history of tripping over her toes. She demonstrates balance deficits with eyes closed, forward reach, and single leg  stance. Pt encouraged to continue [facility] PT at discharge. She is safe to return home when medically appropriate. Pt will benefit from PT services to address deficits in strength, balance, and mobility in order to return to full function at home.       Follow Up Recommendations Home health PT;Other (comment)(Resume HH PT at discharge)    Equipment Recommendations  None recommended by PT    Recommendations for Other Services       Precautions / Restrictions Precautions Precautions: Fall Restrictions Weight Bearing Restrictions: No      Mobility  Bed Mobility               General bed mobility comments: Pt received and left sitting upright at EOB  Transfers Overall transfer level: Needs assistance Equipment used: 4-wheeled walker Transfers: Sit to/from Stand Sit to Stand: Supervision         General transfer comment: Pt provided cues to lock brakes on her walker. She demonstrates safe hand placement and is steady when upright in standing  Ambulation/Gait Ambulation/Gait assistance: Min guard Ambulation Distance (Feet): 200 Feet Assistive device: 4-wheeled walker   Gait velocity: Decreased   General Gait Details: Pt is able to complete a full lap around RN station. Demonstrates adequate speed for limited community mobility. VSS throughout ambulation however pt does report fatigue at end of distance. Pt monitored for fatigue with intermittent check of vitals throughout. Pt does demonstrate some mild bilateral toe drag which appears worse on the LLE. She denies history of tripping over her toes  Soil scientist  Mobility    Modified Rankin (Stroke Patients Only)       Balance Overall balance assessment: Needs assistance Sitting-balance support: No upper extremity supported Sitting balance-Leahy Scale: Good       Standing balance-Leahy Scale: Fair Standing balance comment: Able to place feet together without UE support. Forward reach approx  5-8", positive Romberg, unable to attempt single leg balance                             Pertinent Vitals/Pain Pain Assessment: No/denies pain    Home Living Family/patient expects to be discharged to:: Private residence Living Arrangements: Spouse/significant other;Children Available Help at Discharge: Family(Daughter) Type of Home: House Home Access: Stairs to enter Entrance Stairs-Rails: Left Entrance Stairs-Number of Steps: 3 Home Layout: One level Home Equipment: Bedside commode;Walker - 4 wheels;Grab bars - toilet;Grab bars - tub/shower;Shower seat - built Dietitian)      Prior Function Level of Independence: Needs assistance   Gait / Transfers Assistance Needed: Ambulates with rollator. 1 fall in the last 12 months  ADL's / Homemaking Assistance Needed: Independent with ADLs, assist with IADLs        Hand Dominance   Dominant Hand: Right    Extremity/Trunk Assessment   Upper Extremity Assessment Upper Extremity Assessment: Overall WFL for tasks assessed    Lower Extremity Assessment Lower Extremity Assessment: Generalized weakness;RLE deficits/detail RLE Deficits / Details: Pt reports chronic RLE weakness secondary to neuropathy. She does have some decreased bilateral ankle DF strength. Reports chronic bilateral knee pain with attempts at knee extension strength testing       Communication   Communication: No difficulties  Cognition Arousal/Alertness: Awake/alert Behavior During Therapy: WFL for tasks assessed/performed Overall Cognitive Status: Within Functional Limits for tasks assessed                                        General Comments      Exercises     Assessment/Plan    PT Assessment Patient needs continued PT services  PT Problem List Decreased strength;Decreased activity tolerance;Decreased balance;Decreased mobility       PT Treatment Interventions DME instruction;Gait training;Therapeutic  activities;Therapeutic exercise;Balance training;Neuromuscular re-education;Patient/family education    PT Goals (Current goals can be found in the Care Plan section)  Acute Rehab PT Goals Patient Stated Goal: Return to prior function at home PT Goal Formulation: With patient/family Time For Goal Achievement: 03/16/18 Potential to Achieve Goals: Good    Frequency Min 2X/week   Barriers to discharge        Co-evaluation               AM-PAC PT "6 Clicks" Daily Activity  Outcome Measure Difficulty turning over in bed (including adjusting bedclothes, sheets and blankets)?: None Difficulty moving from lying on back to sitting on the side of the bed? : None Difficulty sitting down on and standing up from a chair with arms (e.g., wheelchair, bedside commode, etc,.)?: A Little Help needed moving to and from a bed to chair (including a wheelchair)?: A Little Help needed walking in hospital room?: A Little Help needed climbing 3-5 steps with a railing? : A Little 6 Click Score: 20    End of Session Equipment Utilized During Treatment: Gait belt Activity Tolerance: Patient tolerated treatment well Patient left: in bed;with call bell/phone within reach;with family/visitor present;Other (comment);with  nursing/sitter in room(Pt left sitting upright at EOB with CNA coming for toileting)   PT Visit Diagnosis: Unsteadiness on feet (R26.81);Muscle weakness (generalized) (M62.81);History of falling (Z91.81);Difficulty in walking, not elsewhere classified (R26.2)    Time: 6237-6283 PT Time Calculation (min) (ACUTE ONLY): 23 min   Charges:   PT Evaluation $PT Eval Low Complexity: 1 Low PT Treatments $Therapeutic Activity: 8-22 mins   PT G Codes:        Lyndel Safe Emma Birchler PT, DPT    Kemberly Taves 03/02/2018, 11:20 AM

## 2018-03-02 NOTE — Progress Notes (Signed)
Discharge instructions given and went over with patient at bedside. Prescription given and reviewed. All questions answered. Patient discharged home with daughter via wheelchair by volunteer services. Madlyn Frankel, RN

## 2018-03-02 NOTE — Progress Notes (Signed)
Inpatient Diabetes Program Recommendations  AACE/ADA: New Consensus Statement on Inpatient Glycemic Control (2015)  Target Ranges:  Prepandial:   less than 140 mg/dL      Peak postprandial:   less than 180 mg/dL (1-2 hours)      Critically ill patients:  140 - 180 mg/dL   Lab Results  Component Value Date   GLUCAP 219 (H) 03/02/2018   HGBA1C 7.1 (H) 03/01/2018    Review of Glycemic ControlResults for [name], [name] (MRN [id number]) as of 03/02/2018 14:18  Ref. Range 03/01/2018 21:07 03/02/2018 07:51 03/02/2018 11:49  Glucose-Capillary Latest Ref Range: 65 - 99 mg/dL 175 (H) 99 219 (H)   Diabetes history: Type 2 DM  Outpatient Diabetes medications: Lantus 26 units q HS, Victoza 1.8 mg daily, Metformin 500 mg daily with lunch Current orders for Inpatient glycemic control:  Novolog sensitive tid with meals, Lantus 26 units q HS, Metformin 500 mg with lunch  Inpatient Diabetes Program Recommendations:   May consider adding Novolog meal coverage 3 units tid with meals (hold if patient eats less than 50%).   Thanks,  Adah Perl, RN, BC-ADM Inpatient Diabetes Coordinator Pager [phone] (8a-5p)

## 2018-03-02 NOTE — Plan of Care (Signed)
  Problem: Education: Goal: Knowledge of General Education information will improve Outcome: Progressing   Problem: Clinical Measurements: Goal: Will remain free from infection Outcome: Progressing Goal: Diagnostic test results will improve Outcome: Progressing   Problem: Safety: Goal: Ability to remain free from injury will improve Outcome: Progressing

## 2018-03-02 NOTE — Care Management Important Message (Signed)
Important Message  Patient Details  Name: [name] MRN: [id number] Date of Birth: 27-Oct-1941   Medicare Important Message Given:  N/A - LOS <3 / Initial given by admissions    Katrina Stack, RN 03/02/2018, 3:17 PM

## 2018-03-05 LAB — URINE CULTURE

## 2018-03-13 NOTE — Discharge Summary (Signed)
[facility] NAME: [name]    MR#:  [id number]  DATE OF BIRTH:  [birth year]  DATE OF ADMISSION:  03/01/2018 ADMITTING PHYSICIAN: Dustin Flock, MD  DATE OF DISCHARGE: 03/02/2018  4:17 PM  PRIMARY CARE PHYSICIAN: Baxter Hire, MD    ADMISSION DIAGNOSIS:  Hyponatremia [E87.1] Hyperglycemia [R73.9] Renal insufficiency [N28.9]  DISCHARGE DIAGNOSIS:  Active Problems:   Hyponatremia   SECONDARY DIAGNOSIS:   Past Medical History:  Diagnosis Date  . Arthritis   . Breast cancer ([place])   . Cancer ([place])   . Diabetes mellitus   . GERD (gastroesophageal reflux disease)   . Hypertension   . Lymph edema    Rt arm  . Neuropathy   . Osteoporosis   . Personal history of chemotherapy 2000   right breast  . Personal history of radiation therapy 2000   right breast  . Thyroid disease     HOSPITAL COURSE:   Patient is a 77 year old white female presenting with generalized weakness fatigue  1.  Hyponatremia could be related to HCTZ therapy and some dehydration We will stop HCTZ Give IV fluids Check serum and urine osmole Due to chronic nature of her hyponatremia I will have nephrology evaluate and give their input  Ur cx done, noted UTI, started on treatment.  Sodium level improved.  2.  Diabetes type 2 with hypoglycemia Patient reports that her sugars are high because of the cookies that she ate earlier normally her blood sugars are under good control Place on sliding scale insulin Continue Lantus and metformin Check a hemoglobin A1c  3.  Hyperlipidemia continue Zetia  4.  Essential hypertension continue Diovan  5.  Miscellaneous Lovenox for DVT prophylaxis    DISCHARGE CONDITIONS:   stable  CONSULTS OBTAINED:  Treatment Team:  Murlean Iba, MD  DRUG ALLERGIES:   Allergies  Allergen Reactions  . Atorvastatin Swelling    Leg swelling Other reaction(s): Unknown Leg swelling Leg  swelling  . Azithromycin Diarrhea and Nausea And Vomiting    Other reaction(s): Nausea And Vomiting  . Ciprofloxacin Other (See Comments)    Does not remember Other reaction(s): Unknown tendonitis tendonitis  . Hydrocodone-Homatropine     REACTION:   . Hydrocodone-Homatropine     Other reaction(s): Other (See Comments) Drowsiness Other reaction(s): Other (See Comments) REACTION:  . Rosiglitazone Swelling    Other reaction(s): Unknown REACTION: SWELLING  . Rosiglitazone Maleate     REACTION: SWELLING    DISCHARGE MEDICATIONS:   Allergies as of 03/02/2018      Reactions   Atorvastatin Swelling   Leg swelling Other reaction(s): Unknown Leg swelling Leg swelling   Azithromycin Diarrhea, Nausea And Vomiting   Other reaction(s): Nausea And Vomiting   Ciprofloxacin Other (See Comments)   Does not remember Other reaction(s): Unknown tendonitis tendonitis   Hydrocodone-homatropine    REACTION:    Hydrocodone-homatropine    Other reaction(s): Other (See Comments) Drowsiness Other reaction(s): Other (See Comments) REACTION:   Rosiglitazone Swelling   Other reaction(s): Unknown REACTION: SWELLING   Rosiglitazone Maleate    REACTION: SWELLING      Medication List    STOP taking these medications   cyanocobalamin 1000 MCG/ML injection Commonly known as:  (VITAMIN B-12)   fluticasone 50 MCG/ACT nasal spray Commonly known as:  FLONASE   meclizine 25 MG tablet Commonly known as:  ANTIVERT   oxyCODONE-acetaminophen 7.5-325 MG tablet Commonly known as:  PERCOCET   Prasterone 6.5  MG Inst Commonly known as:  INTRAROSA     TAKE these medications   ACCU-CHEK AVIVA PLUS test strip Generic drug:  glucose blood USE 1 STRIP TWICE A DAY TO CHECK BLOOD SUGAR   ACCU-CHEK AVIVA PLUS w/Device Kit Use to check blood sugar twice daily   accu-chek multiclix lancets Use as instructed to check blood sugar twice daily E11.9   acetaminophen 650 MG CR tablet Commonly known  as:  TYLENOL 8 HOUR Take 1 tablet (650 mg total) by mouth every 8 (eight) hours as needed for pain.   aspirin 81 MG tablet Take 81 mg by mouth at bedtime.   BD PEN NEEDLE NANO U/F 32G X 4 MM Misc Generic drug:  Insulin Pen Needle USE 1 PEN NEEDLE DAILY   CENTRUM SILVER PO Take 1 tablet by mouth daily.   clobetasol cream 0.05 % Commonly known as:  TEMOVATE Apply 1 application topically 2 (two) times daily. For 1 week, then decrease to as needed .   docusate sodium 100 MG capsule Commonly known as:  COLACE Take 1 capsule (100 mg total) by mouth 2 (two) times daily as needed for mild constipation.   ezetimibe 10 MG tablet Commonly known as:  ZETIA TAKE ONE (1) TABLET EACH DAY   FISH OIL PO Take 1 capsule by mouth daily.   Insulin Glargine 100 UNIT/ML Solostar Pen Commonly known as:  LANTUS SOLOSTAR INJECT 26 UNITS INTO THE SKIN AT BEDTIME.   liraglutide 18 MG/3ML Sopn Commonly known as:  VICTOZA INJECT 1.8 MG INTO THE SKIN DAILY.   metFORMIN 500 MG tablet Commonly known as:  GLUCOPHAGE Take 500 mg by mouth daily with lunch. What changed:  Another medication with the same name was removed. Continue taking this medication, and follow the directions you see here.   naproxen sodium 220 MG tablet Commonly known as:  ALEVE Take 440 mg by mouth daily as needed.   omeprazole 20 MG capsule Commonly known as:  PRILOSEC TAKE ONE (1) CAPSULE EACH DAY   ondansetron 4 MG tablet Commonly known as:  ZOFRAN Take 1 tablet (4 mg total) by mouth daily as needed.   OVER THE COUNTER MEDICATION Take 1 tablet by mouth daily. Immune System Support Supplement includes :  Echinacea Zinc Vitamin C   oxycodone 5 MG capsule Commonly known as:  OXY-IR Take 1 capsule (5 mg total) by mouth every 4 (four) hours as needed.   POTASSIUM PO Take 1 tablet by mouth at bedtime.   valsartan 80 MG tablet Commonly known as:  DIOVAN Take 80 mg by mouth daily.     ASK your doctor about these  medications   cefUROXime 250 MG tablet Commonly known as:  CEFTIN Take 1 tablet (250 mg total) by mouth 2 (two) times daily for 3 days. Ask about: Should I take this medication?        DISCHARGE INSTRUCTIONS:    Follow with PMD in 1-2 weeks  If you experience worsening of your admission symptoms, develop shortness of breath, life threatening emergency, suicidal or homicidal thoughts you must seek medical attention immediately by calling 911 or calling your MD immediately  if symptoms less severe.  You Must read complete instructions/literature along with all the possible adverse reactions/side effects for all the Medicines you take and that have been prescribed to you. Take any new Medicines after you have completely understood and accept all the possible adverse reactions/side effects.   Please note  You were cared for by a  hospitalist during your hospital stay. If you have any questions about your discharge medications or the care you received while you were in the hospital after you are discharged, you can call the unit and asked to speak with the hospitalist on call if the hospitalist that took care of you is not available. Once you are discharged, your primary care physician will handle any further medical issues. Please note that NO REFILLS for any discharge medications will be authorized once you are discharged, as it is imperative that you return to your primary care physician (or establish a relationship with a primary care physician if you do not have one) for your aftercare needs so that they can reassess your need for medications and monitor your lab values.    Today   CHIEF COMPLAINT:   Chief Complaint  Patient presents with  . Abnormal Lab    HISTORY OF PRESENT ILLNESS:  [name]  is a 77 y.o. female with a known history of osteoarthritis, breast cancer, diabetes type 2, GERD, essential hypertension, neuropathy and hypothyroidism who is presenting to the  hospital with generalized weakness and fatigue weakness.  Patient actually was hospitalized recently and noted to have cellulitis of her upper extremity and was discharged home on oral antibiotics and prednisone taper for sciatic pain.  She went to see her primary care provider and had blood work done which showed sodium to be very low and patient was feeling very fatigued and tired therefore she was told to return to the ER.  Patient is noted to have low sodium and her recent blood work.  During her hospitalization her sodium was as low as 125.  She denies any nausea vomiting or diarrhea.  She does report that she urinates frequently.  To note she is on a combination pill that contains HCTZ.   VITAL SIGNS:  Blood pressure (!) 146/62, pulse 91, temperature 98.1 F (36.7 C), temperature source Oral, resp. rate 18, height '5\' 5"'  (1.651 m), weight 83.1 kg (183 lb 4.8 oz), SpO2 98 %.  I/O:  No intake or output data in the 24 hours ending 03/13/18 1402  PHYSICAL EXAMINATION:  GENERAL:  77 y.o.-year-old patient lying in the bed with no acute distress.  EYES: Pupils equal, round, reactive to light and accommodation. No scleral icterus. Extraocular muscles intact.  HEENT: Head atraumatic, normocephalic. Oropharynx and nasopharynx clear.  NECK:  Supple, no jugular venous distention. No thyroid enlargement, no tenderness.  LUNGS: Normal breath sounds bilaterally, no wheezing, rales,rhonchi or crepitation. No use of accessory muscles of respiration.  CARDIOVASCULAR: S1, S2 normal. No murmurs, rubs, or gallops.  ABDOMEN: Soft, non-tender, non-distended. Bowel sounds present. No organomegaly or mass.  EXTREMITIES: No pedal edema, cyanosis, or clubbing.  NEUROLOGIC: Cranial nerves II through XII are intact. Muscle strength 5/5 in all extremities. Sensation intact. Gait not checked.  PSYCHIATRIC: The patient is alert and oriented x 3.  SKIN: No obvious rash, lesion, or ulcer.   DATA REVIEW:   CBC No  results for input(s): WBC, HGB, HCT, PLT in the last 168 hours.  Chemistries  No results for input(s): NA, K, CL, CO2, GLUCOSE, BUN, CREATININE, CALCIUM, MG, AST, ALT, ALKPHOS, BILITOT in the last 168 hours.  Invalid input(s): GFRCGP  Cardiac Enzymes No results for input(s): TROPONINI in the last 168 hours.  Microbiology Results  Results for orders placed or performed during the hospital encounter of 03/01/18  Urine Culture     Status: Abnormal   Collection Time: 03/01/18  7:40 PM  Result Value Ref Range Status   Specimen Description   Final    URINE, RANDOM Performed at [facility], [address] [place], [place] [address]    Special Requests   Final    NONE Performed at [facility], [address] [place], [place] [address]    Culture >=100,000 COLONIES/mL ESCHERICHIA COLI (A)  Final   Report Status 03/05/2018 FINAL  Final   Organism ID, Bacteria ESCHERICHIA COLI (A)  Final      Susceptibility   Escherichia coli - MIC*    AMPICILLIN 8 SENSITIVE Sensitive     CEFAZOLIN <=4 SENSITIVE Sensitive     CEFTRIAXONE <=1 SENSITIVE Sensitive     CIPROFLOXACIN <=0.25 SENSITIVE Sensitive     GENTAMICIN <=1 SENSITIVE Sensitive     IMIPENEM <=0.25 SENSITIVE Sensitive     NITROFURANTOIN <=16 SENSITIVE Sensitive     TRIMETH/SULFA <=20 SENSITIVE Sensitive     AMPICILLIN/SULBACTAM 4 SENSITIVE Sensitive     PIP/TAZO <=4 SENSITIVE Sensitive     Extended ESBL NEGATIVE Sensitive     * >=100,000 COLONIES/mL ESCHERICHIA COLI    RADIOLOGY:  No results found.  EKG:   Orders placed or performed during the hospital encounter of 03/01/18  . ED EKG within 10 minutes  . ED EKG within 10 minutes  . EKG      Management plans discussed with the patient, family and they are in agreement.  CODE STATUS:  Code Status History    Date Active Date Inactive Code Status Order ID Comments User Context   03/01/2018 2142 03/02/2018 1919 Full Code [id number]  Dustin Flock, MD  Inpatient   02/11/2018 1755 02/13/2018 1752 Full Code [id number]  Idelle Crouch, MD Inpatient   10/18/2016 1322 10/19/2016 1439 Full Code [id number]  Rubie Maid, MD Inpatient    Advance Directive Documentation     Most Recent Value  Type of Advance Directive  Living will  Pre-existing out of facility DNR order (yellow form or pink MOST form)  -  "MOST" Form in Place?  -      TOTAL TIME TAKING CARE OF THIS PATIENT: 35 minutes.    Vaughan Basta M.D on 03/13/2018 at 2:02 PM  Between 7am to 6pm - Pager - [phone]  After 6pm go to [email or web address] - password [email or web address] [facility]  Office  [phone]  CC: Primary care physician; Baxter Hire, MD   Note: This dictation was prepared with Dragon dictation along with smaller phrase technology. Any transcriptional errors that result from this process are unintentional.

## 2018-03-14 ENCOUNTER — Other Ambulatory Visit: Payer: Self-pay | Admitting: Family Medicine

## 2018-03-15 ENCOUNTER — Inpatient Hospital Stay: Admit: 2018-03-15 | Payer: Medicare Other | Admitting: Orthopedic Surgery

## 2018-03-15 SURGERY — ARTHROPLASTY, KNEE, TOTAL, USING IMAGELESS COMPUTER-ASSISTED NAVIGATION
Anesthesia: Choice | Laterality: Left

## 2018-03-16 ENCOUNTER — Other Ambulatory Visit: Payer: Self-pay | Admitting: Family Medicine

## 2018-03-17 NOTE — Telephone Encounter (Signed)
JJ-Plz see refill req/thx dmf 

## 2018-04-04 ENCOUNTER — Other Ambulatory Visit: Payer: Self-pay | Admitting: Family Medicine

## 2018-04-04 NOTE — Telephone Encounter (Signed)
JJ-Plz see refill req from pharm/thx dmf

## 2018-04-17 ENCOUNTER — Other Ambulatory Visit: Payer: Self-pay | Admitting: Family Medicine

## 2018-04-24 ENCOUNTER — Other Ambulatory Visit: Payer: Self-pay | Admitting: Family Medicine

## 2018-04-25 NOTE — Telephone Encounter (Signed)
Dr Edwina Barth- please see refill request. Thanks TLG

## 2018-06-02 IMAGING — MR MR LUMBAR SPINE W/O CM
5 series · 32 of 48 positions shown · non-contrast
Comparison: CT Abdomen and Pelvis 03/25/2013. Lumbar radiographs
09/08/2012.

CLINICAL DATA: 75-year-old female with remote lumbar surgery 30
years ago. Lumbar back pain radiating down the lateral aspect of
both lower extremities. Lumbar radiculopathy. Initial encounter.

EXAM:
MRI LUMBAR SPINE WITHOUT CONTRAST
TECHNIQUE: Multiplanar, multisequence MR imaging of the lumbar spine was
performed. No intravenous contrast was administered.

[Series 2: T2 · sagittal · 4.0mm · 0.81mm/px · 6 of 15 slices shown (1 of 2)]
[im 1/15]
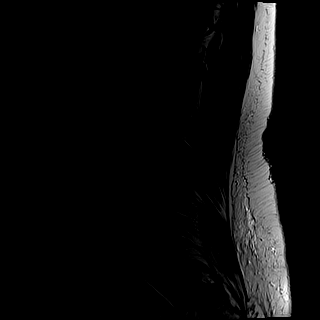
[im 3/15]
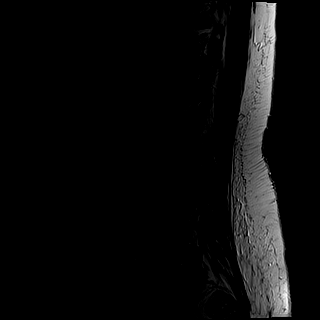
[im 6/15]
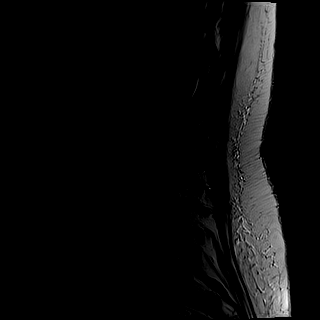
[im 9/15]
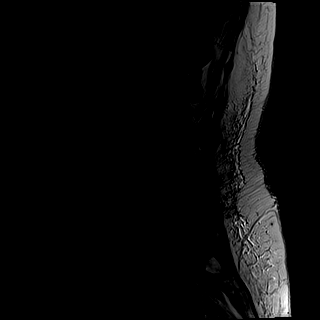
[im 12/15]
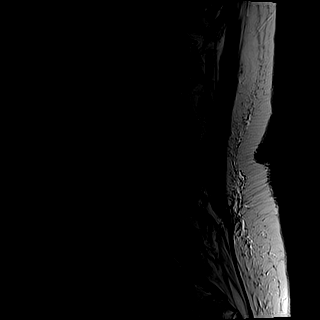
[im 15/15]
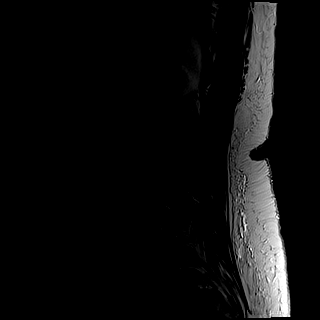

[Series 3: T1 · sagittal · 4.0mm · 0.81mm/px · 6 of 15 slices shown (1 of 2)]
[im 1/15]
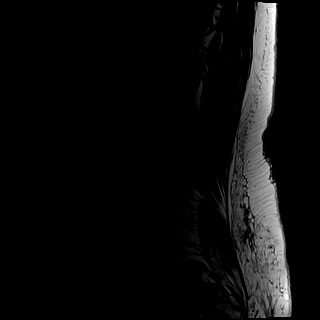
[im 3/15]
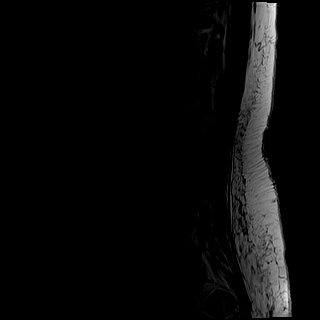
[im 6/15]
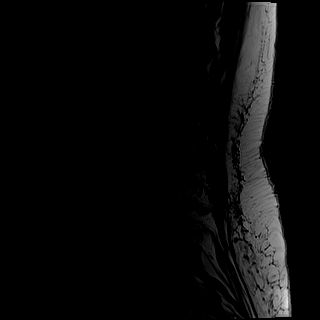
[im 9/15]
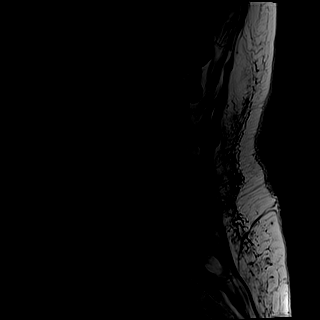
[im 12/15]
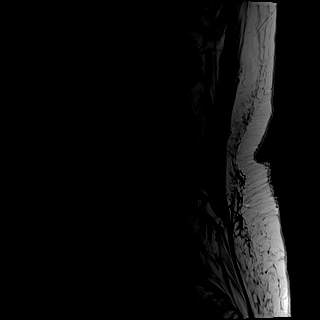
[im 15/15]
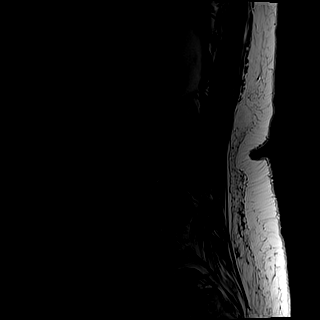

[Series 4: STIR · sagittal · 4.0mm · 1.02mm/px · 2 of 15 slices shown]
[im 1/15]
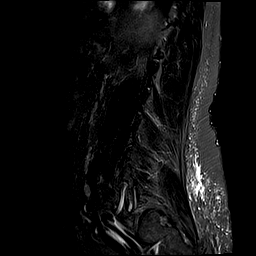
[im 3/15]
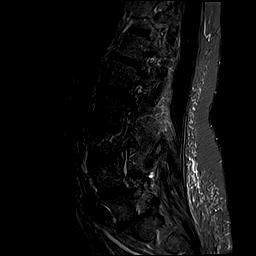

[Series 5: T2 · axial · 4.0mm · 0.78mm/px · z∈[-24,+175]mm · 9 of 38 slices shown (2 of 2)]
[im 1/38]
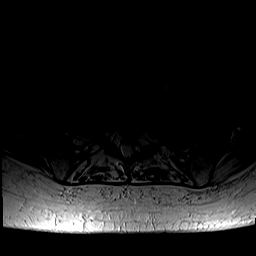
[im 6/38]
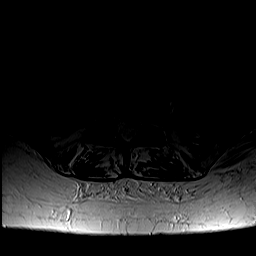
[im 11/38]
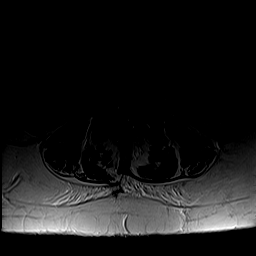
[im 16/38]
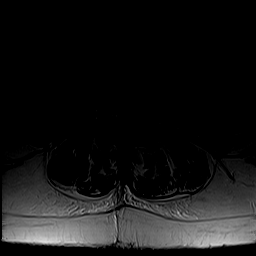
[im 19/38]
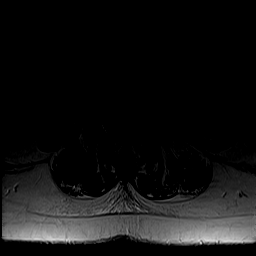
[im 22/38]
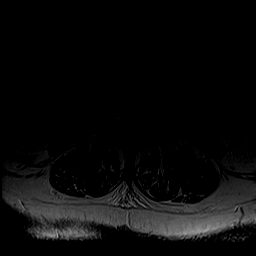
[im 27/38]
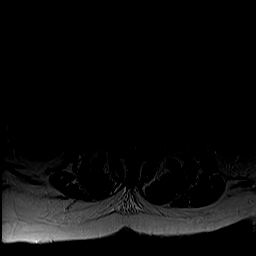
[im 32/38]
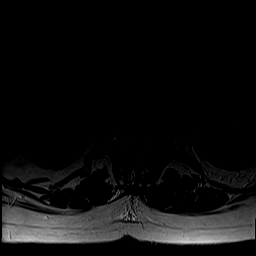
[im 38/38]
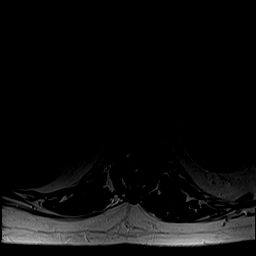

[Series 6: T1 · axial · 4.0mm · 0.39mm/px · z∈[-24,+175]mm · 9 of 38 slices shown (2 of 2)]
[im 1/38]
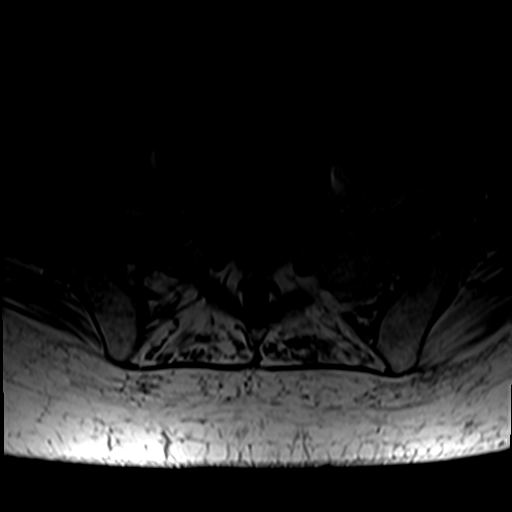
[im 6/38]
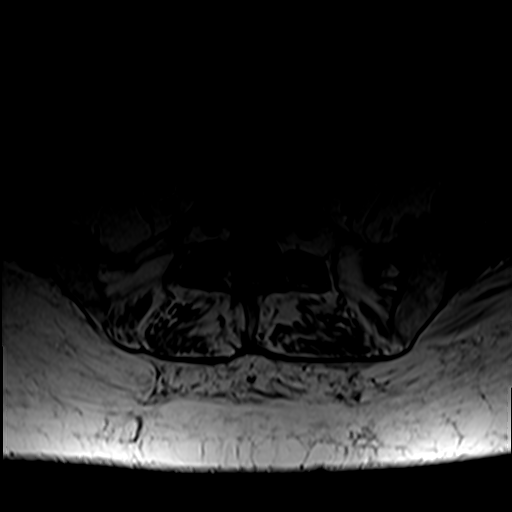
[im 11/38]
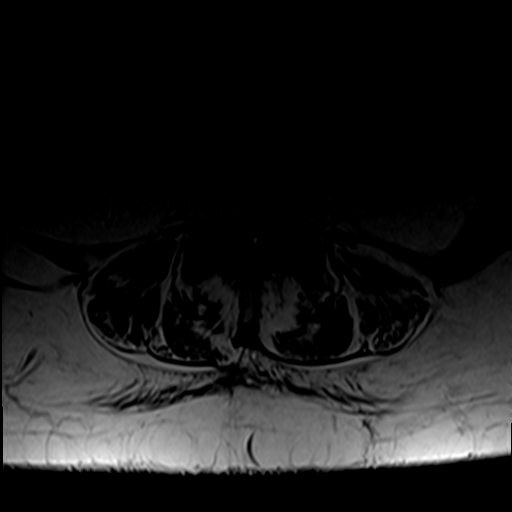
[im 16/38]
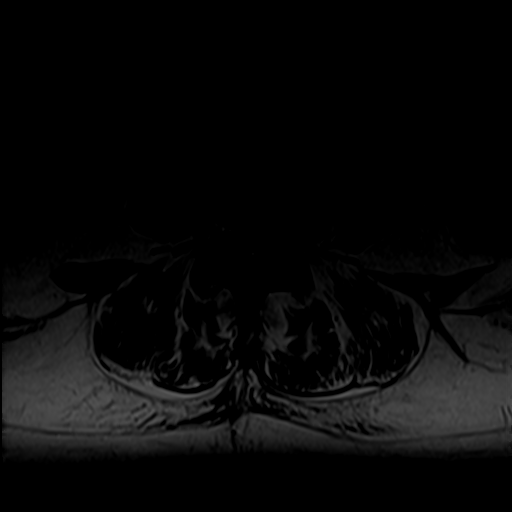
[im 19/38]
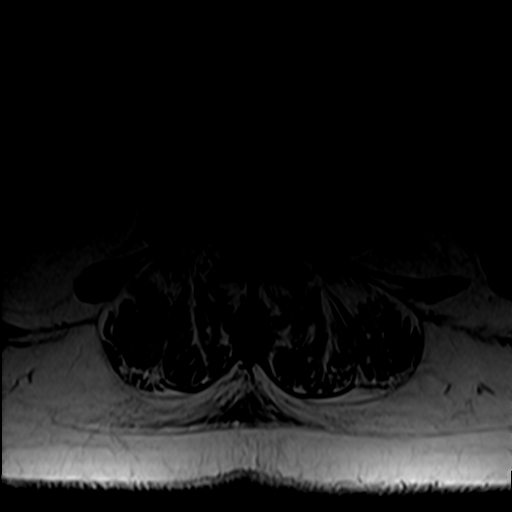
[im 22/38]
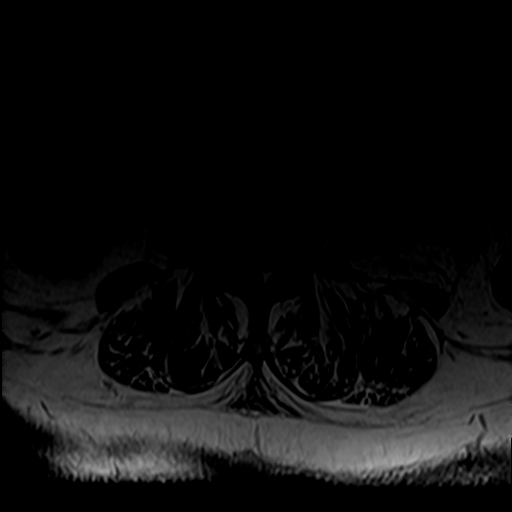
[im 27/38]
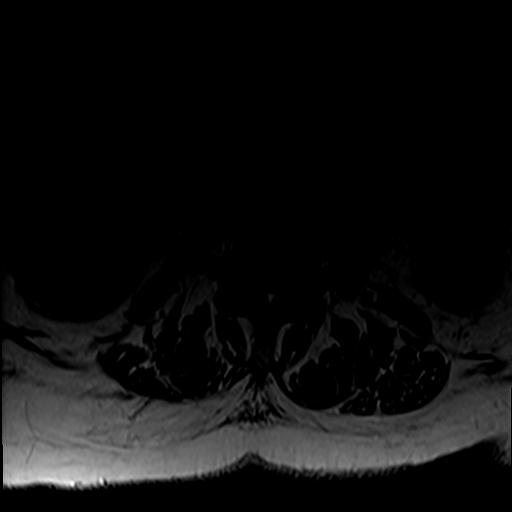
[im 32/38]
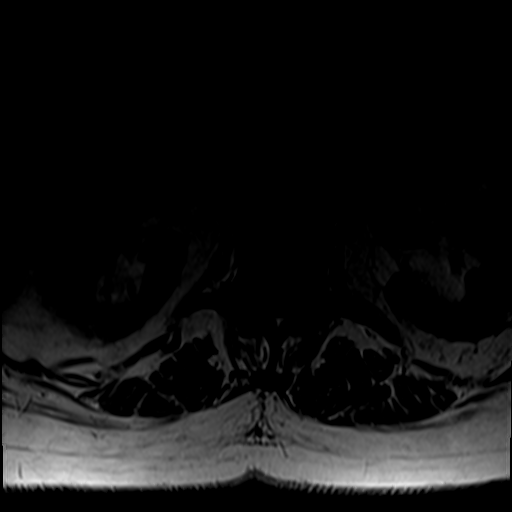
[im 38/38]
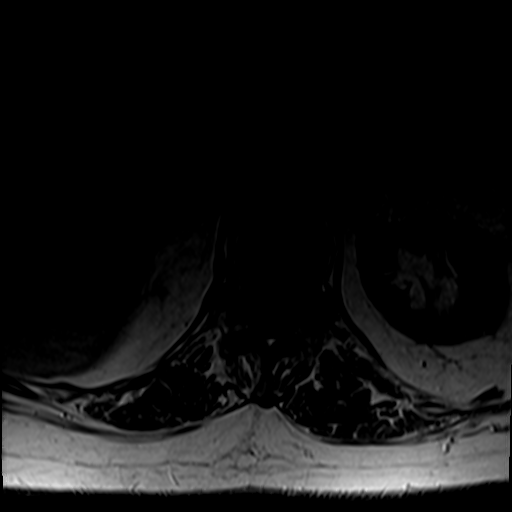

[32 of 48 positions shown; findings below may reference images not displayed]

FINDINGS: Segmentation: Normal as demonstrated on the comparison lumbar
radiographs.

Alignment: Mild grade 1 anterolisthesis of L4 on L5 has developed
since 4699, measuring 3-4 mm. Mild levoconvex lumbar scoliosis is
more stable.

Vertebrae: Progressed disc space loss and endplate spurring at
T12-L1 since 4699. Associated degenerative endplate marrow edema.
Mild degenerative marrow edema in the left L4 pedicle and lamina
(series 4, image 11). Normal bone marrow signal elsewhere.

Conus medullaris: Extends to the L1 level and appears normal.

Paraspinal and other soft tissues: Negative; Calcified aortic
atherosclerosis demonstrated on the prior studies.

Disc levels:

T10-T11:  Mild to moderate facet hypertrophy.  No definite stenosis.

T11-12: Mild facet hypertrophy.  No stenosis.

T12-L1: Moderate to severe disc space loss. Circumferential but
mostly right lateral disc bulge and endplate spurring. Mild right
T12 foraminal stenosis.

L1-L2:  Borderline to mild facet hypertrophy.

L2-L3: Disc desiccation and disc space loss. Left eccentric disc
bulge. Mild to moderate facet hypertrophy greater on the right. Mild
left lateral recess stenosis (descending left L3 nerve root level).
No spinal or foraminal stenosis.

L3-L4: Left eccentric circumferential disc bulge. Left far lateral
endplate spurring. Moderate facet and ligament flavum hypertrophy.
Borderline to mild spinal and left lateral recess stenosis.
Borderline to mild left L3 foraminal stenosis. There is a small
midline synovial cyst (series 4, image 8) which is not significantly
contributing to stenosis at this time. There is a posterior synovial
cyst on the right (series 4, image 6) which should not cause neural
compromise.

L4-L5: Anterolisthesis. Disc desiccation. Posterior broad-based
annular fissure of the disc. Circumferential disc bulge. Severe
facet degeneration. Left facet joint effusion and multiple left
greater than right synovial cysts. One of these seen on series 5,
image 28 and series 4, image 9 projects into the left lateral recess
and measures 3 x 7 x 6 mm (AP by transverse by CC). This contributes
to severe left lateral recess stenosis (descending left L5 nerve
root level). There is superimposed epidural lipomatosis. Overall
moderate spinal stenosis. No significant foraminal stenosis.

L5-S1: Chronic severe disc space loss. Mild retrolisthesis. Mild
endplate and facet spurring. Probable remote postoperative changes
to the lamina here. No stenosis.
IMPRESSION: 1. Mild grade 1 anterolisthesis has developed at L4-L5 since 4699
with associated severe facet arthropathy. Superimposed disc
degeneration. Subsequent multifactorial severe left lateral recess
stenosis (in part due to of a 6-7 mm left side degenerative synovial
cyst) and moderate spinal stenosis.
2. Moderate to severe facet degeneration also at L3-L4 superimposed
on mild disc bulge. Up to mild spinal, left lateral recess, and left
foraminal stenosis at that level.
3. Progressed and severe disc space loss at T12-L1 since 4699. Acute
on chronic associated endplate degeneration.
4. Remote postoperative changes at L5-S1 with chronic disc and
endplate degeneration but no stenosis.

## 2018-08-30 ENCOUNTER — Other Ambulatory Visit: Payer: Self-pay | Admitting: Obstetrics and Gynecology

## 2018-08-31 ENCOUNTER — Other Ambulatory Visit: Payer: Self-pay | Admitting: Obstetrics and Gynecology

## 2018-12-12 ENCOUNTER — Other Ambulatory Visit: Payer: Self-pay | Admitting: Internal Medicine

## 2018-12-12 DIAGNOSIS — Z1231 Encounter for screening mammogram for malignant neoplasm of breast: Secondary | ICD-10-CM

## 2018-12-26 ENCOUNTER — Ambulatory Visit: Payer: Medicare Other | Admitting: Obstetrics and Gynecology

## 2018-12-26 ENCOUNTER — Encounter: Payer: Self-pay | Admitting: Obstetrics and Gynecology

## 2018-12-26 VITALS — BP 146/83 | HR 99 | Ht 65.0 in | Wt 194.6 lb

## 2018-12-26 DIAGNOSIS — N3281 Overactive bladder: Secondary | ICD-10-CM | POA: Diagnosis not present

## 2018-12-26 DIAGNOSIS — R21 Rash and other nonspecific skin eruption: Secondary | ICD-10-CM | POA: Diagnosis not present

## 2018-12-26 MED ORDER — CLOBETASOL PROPIONATE 0.05 % EX CREA: 1.0000 "application " | TOPICAL_CREAM | Freq: Two times a day (BID) | CUTANEOUS | 0 refills | Status: DC

## 2018-12-26 MED ORDER — TOLTERODINE TARTRATE ER 2 MG PO CP24: 2.0000 mg | ORAL_CAPSULE | Freq: Every day | ORAL | 3 refills | Status: DC

## 2018-12-26 NOTE — Progress Notes (Signed)
Pt is present today due to having a rash on the inner thigh of her right leg. Pt stated that the area is itching and burning. Pt stated that she started putting some cream on the area and it has cleared up some.

## 2018-12-26 NOTE — Progress Notes (Signed)
    GYNECOLOGY PROGRESS NOTE  Subjective:    Patient ID: [name], female    DOB: 1941/06/21, 78 y.o.   MRN: [id number]  HPI  Patient is a 78 y.o. [id number] female who presents for complaints of a rash in her right groin, that appeared beginning last Friday.  Patient notes that she is not quite sure where the rash came from.  She does report that she is also been taking amoxicillin for lingering cold, currently has 2 days left.  However she does note that she has taken amoxicillin in the past without any sort allergic reaction.  She has been using the clobetasol that she typically uses for her lichen sclerosis on the rash, which is made the rash much better.  Notes that she just wanted to follow-up to see if it was okay to continue use of the clobetasol, as her tube is almost empty.  Additionally, patient notes that even despite her bladder tack in 2017, she is still been noting some issues with urinary urgency and increased frequency of voiding.  Notes that symptoms have been gradually worsening.  She notes having some difficulty sleeping as she oftentimes has to wake up to void once or twice during the night.   The following portions of the patient's history were reviewed and updated as appropriate: allergies, current medications, past family history, past medical history, past social history, past surgical history and problem list.  Review of Systems Pertinent items noted in HPI and remainder of comprehensive ROS otherwise negative.   Objective:   Blood pressure (!) 146/83, pulse 99, height 5\' 5"  (1.651 m), weight 194 lb 9.6 oz (88.3 kg). General appearance: alert and no distress Skin: Faint macular rash noted in right groin region, approximately 4 x 3 cm.  Area is non-erythematous, appears to be fading.    Assessment:   Skin rash Overactive bladder  Plan:   -Advised the patient can continue use of her clobetasol for the skin rash as it appears to be helping her symptoms.   Unclear if skin rash was sporadic, or if it was related to recent antibiotic use, however patient notes prior use of amoxicillin in the past with no problems.  Refill on Clobetasol given.  -Overactive bladder symptoms.  Patient has noted symptoms in the past however at the time declined medical management however notes the symptoms are starting to worsen and would like to revisit her options.  Discussed OAB medications, will prescribe Detrol, to start at 2 mg dosing, and can titrate up as needed..  -Patient will need to follow-up in 4 to 6 weeks to reassess symptoms.   Rubie Maid, MD [facility]

## 2019-01-11 ENCOUNTER — Ambulatory Visit
Admission: RE | Admit: 2019-01-11 | Discharge: 2019-01-11 | Disposition: A | Discharge status: Home / Self Care | Payer: Medicare Other | Source: Ambulatory Visit | Attending: Internal Medicine | Admitting: Internal Medicine

## 2019-01-11 DIAGNOSIS — Z1231 Encounter for screening mammogram for malignant neoplasm of breast: Secondary | ICD-10-CM | POA: Diagnosis present

## 2019-02-02 ENCOUNTER — Telehealth: Payer: Self-pay | Admitting: Obstetrics and Gynecology

## 2019-02-02 NOTE — Telephone Encounter (Signed)
Pt called to stated that she was having UTI symptoms for 3 weeks and she went to her PCP and they treated her for an UTI. Pt stated that she was given 2 types of antibiotic for the UTI. Pt stated that she think that she still has the UTI and wants to see [facility]. Pt already has an appointment to see [facility] next week. Pt just wanted to let [facility] know.

## 2019-02-02 NOTE — Telephone Encounter (Signed)
Patient is afraid that she has another UTI after medication. The patient is requesting a call back today due to her being extremely uncomfortable. Please advise.

## 2019-02-07 ENCOUNTER — Encounter: Payer: Self-pay | Admitting: Obstetrics and Gynecology

## 2019-02-07 ENCOUNTER — Ambulatory Visit: Payer: Medicare Other | Admitting: Obstetrics and Gynecology

## 2019-02-07 VITALS — BP 163/83 | HR 106 | Ht 65.0 in | Wt 194.9 lb

## 2019-02-07 DIAGNOSIS — N8110 Cystocele, unspecified: Secondary | ICD-10-CM | POA: Diagnosis not present

## 2019-02-07 DIAGNOSIS — R5382 Chronic fatigue, unspecified: Secondary | ICD-10-CM | POA: Diagnosis not present

## 2019-02-07 DIAGNOSIS — N390 Urinary tract infection, site not specified: Secondary | ICD-10-CM

## 2019-02-07 LAB — POCT URINALYSIS DIPSTICK
Bilirubin, UA: NEGATIVE
Glucose, UA: NEGATIVE
Ketones, UA: NEGATIVE
LEUKOCYTES UA: NEGATIVE
Nitrite, UA: NEGATIVE
PH UA: 6 (ref 5.0–8.0)
Protein, UA: NEGATIVE
RBC UA: NEGATIVE
Spec Grav, UA: 1.015 (ref 1.010–1.025)
UROBILINOGEN UA: 0.2 U/dL

## 2019-02-07 NOTE — Progress Notes (Signed)
    GYNECOLOGY PROGRESS NOTE  Subjective:    Patient ID: [name], female    DOB: 05-12-1941, 78 y.o.   MRN: [id number]  HPI  Patient is a 78 y.o. [id number] female who presents for complaints of recurrent urinary tract infection, and nocturia. She notes she was diagnosed with a UTI by her PCP last week and was started on an antibiotic which later had to be changed due to sensitivities. She reports that for the past month or so, she has been experiencing more nocturia, having to go to the restroom 4-5 times per night. Is able to stay dry during the day, but stills wears urinary pads.  Does not feel that the Detrol is working for her.     She also continues to complain of general fatigue.  Has had this complaint for many months. Has been seen by her PCP regarding this issue but has not had an official workup other than the occasional Vitamin check.   The following portions of the patient's history were reviewed and updated as appropriate: allergies, current medications, past family history, past medical history, past social history, past surgical history and problem list.  Review of Systems Pertinent items noted in HPI and remainder of comprehensive ROS otherwise negative.   Objective:   Blood pressure (!) 163/83, pulse (!) 106, height 5\' 5"  (1.651 m), weight 194 lb 14.4 oz (88.4 kg). General appearance: alert, cooperative and no distress Abdomen: soft, non-tender; bowel sounds normal; no masses,  no organomegaly Pelvic: external genitalia normal, rectovaginal septum normal.  Vagina without discharge, Grade 2-3 cystocele present, mild atrophy present.  Uterus and cervix surgically absent.    Labs:  Results for orders placed or performed in visit on 02/07/19  POCT urinalysis dipstick  Result Value Ref Range   Color, UA yellow    Clarity, UA clear    Glucose, UA Negative Negative   Bilirubin, UA neg    Ketones, UA neg    Spec Grav, UA 1.015 1.010 - 1.025   Blood, UA neg    pH, UA  6.0 5.0 - 8.0   Protein, UA Negative Negative   Urobilinogen, UA 0.2 0.2 or 1.0 E.U./dL   Nitrite, UA neg    Leukocytes, UA Negative Negative   Appearance yellow    Odor       Assessment:   Recurrent UTI's Cystocele Fatigue (chronic)  Plan:   - Discussed that recurrent UTI's may be secondary to bladder prolapse, vaginal atrophy, or both.  Discussed option of management with pessary vs surgical management (although due to patient's other symptoms and age, not sure if patient would feel up to surgical management at this time). Patient hesitant, but willing to try pessary after some discussion.  Will return in 1-2 weeks for pessary fitting.  - Continue antibiotics for current UTI until completed. May also help urinary symptoms.  - Fatigue, patient has been noting for some time. Has another appt with her PCP soon for further workup and management.    Rubie Maid, MD [facility]

## 2019-02-07 NOTE — Progress Notes (Signed)
Pt is present today due to reoccurring UTI issues.

## 2019-02-11 ENCOUNTER — Encounter: Payer: Self-pay | Admitting: Obstetrics and Gynecology

## 2019-02-16 ENCOUNTER — Encounter: Payer: Self-pay | Admitting: Obstetrics and Gynecology

## 2019-02-16 ENCOUNTER — Ambulatory Visit: Payer: Medicare Other | Admitting: Obstetrics and Gynecology

## 2019-02-16 VITALS — BP 124/76 | Ht 65.0 in | Wt 195.2 lb

## 2019-02-16 DIAGNOSIS — N3281 Overactive bladder: Secondary | ICD-10-CM

## 2019-02-16 DIAGNOSIS — R351 Nocturia: Secondary | ICD-10-CM

## 2019-02-16 DIAGNOSIS — N8110 Cystocele, unspecified: Secondary | ICD-10-CM | POA: Diagnosis not present

## 2019-02-16 DIAGNOSIS — N952 Postmenopausal atrophic vaginitis: Secondary | ICD-10-CM | POA: Diagnosis not present

## 2019-02-16 DIAGNOSIS — N390 Urinary tract infection, site not specified: Secondary | ICD-10-CM | POA: Diagnosis not present

## 2019-02-16 MED ORDER — MIRABEGRON ER 25 MG PO TB24: 25.0000 mg | ORAL_TABLET | Freq: Every day | ORAL | 3 refills | Status: DC

## 2019-02-16 NOTE — Progress Notes (Signed)
    GYNECOLOGY PROGRESS NOTE  Subjective:    Patient ID: [name], female    DOB: 22-Dec-1940, 78 y.o.   MRN: [id number]  HPI  Patient is an 78 y.o. [id number] female who presents for pessary fitting.  She currently has a Grade 3 cystocele, with nocturia (waking up to void between 3-6 times per night), recurrent UTI's and overactive bladder symptoms.  She states that she has been taking AZO OTC brand for overactive bladder symptoms which has helped some (had to stop taking the Detrol as she did not like the side effects).   The following portions of the patient's history were reviewed and updated as appropriate: allergies, current medications, past family history, past medical history, past social history, past surgical history and problem list.  Review of Systems Pertinent items noted in HPI and remainder of comprehensive ROS otherwise negative.   Objective:   Blood pressure 124/76, height 5\' 5"  (1.651 m), weight 195 lb 3.2 oz (88.5 kg). General appearance: alert and no distress Abdomen: soft, non-tender; bowel sounds normal; no masses,  no organomegaly Pelvic: external genitalia normal, rectovaginal septum normal.  Vagina without discharge, Grade 2-3 cystocele present, mild atrophy present.  Uterus and cervix surgically absent.    Assessment:   Grade 3 Cystocele Nocturia Overactive bladder Recurrent UTI's Vaginal atrophy  Plan:   - Pessary fitting performed today.  Several different pessaries tried, including ring with support (expulsion noted), and cube (worked well but patient did not want to maintain at home). Will order Size 2 1/4 Gelhorn pessary.  Patient to f/u in 1 week for insertion.  - Will prescribe Myrbetric for OAB symptoms - given samples of Premarin cream for use prior to pessary insertion. To use 0.5 mg daily until next visit to help with ease of insertion and discomfort.    A total of 25 minutes were spent face-to-face with the patient during this encounter and  over half of that time involved pessary fitting.   Rubie Maid, MD [facility]

## 2019-02-16 NOTE — Progress Notes (Signed)
Pt is present today for a follow up for UTI and prolapse bladder. Pt stated that she would like to speak more with the doctor about other concerns that she was having.

## 2019-02-21 ENCOUNTER — Telehealth: Payer: Self-pay | Admitting: Obstetrics and Gynecology

## 2019-02-21 NOTE — Telephone Encounter (Signed)
Pt called no answer LM via voicemail that her pessary did not arrive and she would receive a call when it arrived.

## 2019-02-21 NOTE — Telephone Encounter (Signed)
The patient called and stated that she is wanting to know if her pessary in in the office that was ordered. The patient would like a call back before her appointment tomorrow on 02/22/19 at 11:30. Please advise.

## 2019-02-22 ENCOUNTER — Telehealth: Payer: Self-pay

## 2019-02-22 ENCOUNTER — Encounter: Payer: Medicare Other | Admitting: Obstetrics and Gynecology

## 2019-02-22 NOTE — Telephone Encounter (Signed)
Pt called and stated that she received my message. Pt was informed as soon as her pessary arrived that she would receive a call.

## 2019-02-22 NOTE — Telephone Encounter (Signed)
Please see another phone encounter.  

## 2019-02-28 ENCOUNTER — Telehealth: Payer: Self-pay

## 2019-02-28 NOTE — Telephone Encounter (Signed)
Pt called to inform her that her pessary had arrived. Pt stated that she think she has an UTI but is scared to come out to get her pessary and be checked for an UTI due to the current situation with the cov-id. Pt was advised to increase her water intake and drink cranberry juice. Pt also was informed that if someone could bring her some AZO tablets they would help as well. Pt stated that she would those suggestions.

## 2019-05-21 ENCOUNTER — Other Ambulatory Visit: Payer: Self-pay | Admitting: Obstetrics and Gynecology

## 2019-05-22 NOTE — Telephone Encounter (Signed)
Pt called no answer LM stating about the refill from her pharmacy and asked pt to return call to office to see if the medication was working for her so a refill could be sent in.

## 2019-05-22 NOTE — Telephone Encounter (Signed)
Pt called no answer LM via voicemail to call the office.

## 2019-05-22 NOTE — Telephone Encounter (Signed)
Please make sure that the medicaiton is working for her, then can refill for the remainder of the year.

## 2019-05-23 NOTE — Telephone Encounter (Signed)
Pt called no answer LM via voicemail to call the office to speak about refill request.

## 2019-05-25 NOTE — Telephone Encounter (Signed)
Pt called and stated that the medication was helping her in the morning but not at night. Pt stated that she is still having to get up q2h throughout the night to use the restroom. Pt made an appointment with [facility] to get her pessary inserted.

## 2019-06-07 ENCOUNTER — Telehealth: Payer: Self-pay

## 2019-06-07 NOTE — Progress Notes (Signed)
Pt present for medication check. Pt stated having to get up 6-7 times throughout the night to use the bathroom.

## 2019-06-07 NOTE — Telephone Encounter (Signed)
Pt prescreened no symptoms has face mask.   Coronavirus (COVID-19) Are you at risk?  Are you at risk for the Coronavirus (COVID-19)?  To be considered HIGH RISK for Coronavirus (COVID-19), you have to meet the following criteria:  . Traveled to [place], [place], [place], [place] or [place]; or in the [place] to [place], [place], [place], or [place]; and have fever, cough, and shortness of breath within the last 2 weeks of travel OR . Been in close contact with a person diagnosed with COVID-19 within the last 2 weeks and have fever, cough, and shortness of breath . IF YOU DO NOT MEET THESE CRITERIA, YOU ARE CONSIDERED LOW RISK FOR COVID-19.  What to do if you are HIGH RISK for COVID-19?  . If you are having a medical emergency, call 911. . Seek medical care right away. Before you go to a doctor's office, urgent care or emergency department, call ahead and tell them about your recent travel, contact with someone diagnosed with COVID-19, and your symptoms. You should receive instructions from your physician's office regarding next steps of care.  . When you arrive at healthcare provider, tell the healthcare staff immediately you have returned from visiting [place], [place], [place], [place] or [place]; or traveled in the [place] to [place], [place], [place], or [place]; in the last two weeks or you have been in close contact with a person diagnosed with COVID-19 in the last 2 weeks.   . Tell the health care staff about your symptoms: fever, cough and shortness of breath. . After you have been seen by a medical provider, you will be either: o Tested for (COVID-19) and discharged home on quarantine except to seek medical care if symptoms worsen, and asked to  - Stay home and avoid contact with others until you get your results (4-5 days)  - Avoid travel on public transportation if possible (such as bus, train, or airplane) or o Sent to the Emergency Department by EMS for  evaluation, COVID-19 testing, and possible admission depending on your condition and test results.  What to do if you are LOW RISK for COVID-19?  Reduce your risk of any infection by using the same precautions used for avoiding the common cold or flu:  . Wash your hands often with soap and warm water for at least 20 seconds.  If soap and water are not readily available, use an alcohol-based hand sanitizer with at least 60% alcohol.  . If coughing or sneezing, cover your mouth and nose by coughing or sneezing into the elbow areas of your shirt or coat, into a tissue or into your sleeve (not your hands). . Avoid shaking hands with others and consider head nods or verbal greetings only. . Avoid touching your eyes, nose, or mouth with unwashed hands.  . Avoid close contact with people who are sick. . Avoid places or events with large numbers of people in one location, like concerts or sporting events. . Carefully consider travel plans you have or are making. . If you are planning any travel outside or inside the [place], visit the CDC's Travelers' Health webpage for the latest health notices. . If you have some symptoms but not all symptoms, continue to monitor at home and seek medical attention if your symptoms worsen. . If you are having a medical emergency, call 911.   ADDITIONAL HEALTHCARE OPTIONS FOR PATIENTS   Telehealth / e-Visit: [email or web address]           [facility]: [phone]  [facility]: [phone]                   [facility]: [phone]  

## 2019-06-08 ENCOUNTER — Ambulatory Visit: Payer: Medicare Other | Admitting: Obstetrics and Gynecology

## 2019-06-08 ENCOUNTER — Other Ambulatory Visit: Payer: Self-pay

## 2019-06-08 ENCOUNTER — Encounter: Payer: Self-pay | Admitting: Obstetrics and Gynecology

## 2019-06-08 VITALS — BP 132/70 | HR 80 | Ht 65.0 in | Wt 191.1 lb

## 2019-06-08 DIAGNOSIS — N952 Postmenopausal atrophic vaginitis: Secondary | ICD-10-CM

## 2019-06-08 DIAGNOSIS — N3281 Overactive bladder: Secondary | ICD-10-CM | POA: Diagnosis not present

## 2019-06-08 DIAGNOSIS — N390 Urinary tract infection, site not specified: Secondary | ICD-10-CM

## 2019-06-08 DIAGNOSIS — N8110 Cystocele, unspecified: Secondary | ICD-10-CM

## 2019-06-08 DIAGNOSIS — R351 Nocturia: Secondary | ICD-10-CM

## 2019-06-08 NOTE — Progress Notes (Signed)
    GYNECOLOGY PROGRESS NOTE  Subjective:    Patient ID: [name], female    DOB: 12-29-1940, 78 y.o.   MRN: [id number]  HPI  Patient is a 78 y.o. [id number] female who presents for pessary fitting.  She currently has a Grade 3 cystocele, with nocturia (waking up to void between 3-7 times per night), recurrent UTI's and overactive bladder symptoms.  Was initiated on Myrbetric last visit, and has noticed some improvement in daytime symptoms (does not require use of a urinary pad), but still noticing no change in night-time symptoms.  Still debating if she should have the pessary placed or if she should consider surgery.  Notes that she is not sleeping at night.   Patient also reports some sadness over grief of losing a friend recently. But is otherwise doing ok.    The following portions of the patient's history were reviewed and updated as appropriate: allergies, current medications, past family history, past medical history, past social history, past surgical history and problem list.  Review of Systems Pertinent items noted in HPI and remainder of comprehensive ROS otherwise negative.   Objective:   Blood pressure 132/70, pulse 80, height 5\' 5"  (1.651 m), weight 191 lb 1.6 oz (86.7 kg). General appearance: alert and no distress Abdomen: soft, non-tender; bowel sounds normal; no masses,  no organomegaly Pelvic: external genitalia normal, rectovaginal septum normal.  Vagina without discharge, Grade 3 cystocele present, mild atrophy present.  Uterus and cervix surgically absent.     Assessment:   Grade 3 Cystocele Nocturia Overactive bladder Recurrent UTI's Vaginal atrophy  Plan:   -  Further discussion had on surgery vs pessary use.   Several different pessaries tried, including ring with support (expulsion noted), and cube (worked well but patient did not want to maintain at home). Size 2 1/2 Gelhorn pessary fitted last visit. Attempted use in the office, however patient with  partial expulsion after attempting to void.  Pessary removed today. Offered to go up 1/2 size, however patient notes that the current one was already somewhat uncomfortable. Notes that at this point she would prefer to just have surgery.  Will contact her PCP to assess her overall health and if she should consider surgery. If surgery is an option, will return to schedule anterior   colporrhaphy.  - To continue Myrbetric for OAB symptoms - To use Trimo-[place] gel weekly for maintenance of pessary.    A total of 15 minutes were spent face-to-face with the patient during this encounter and over half of that time dealt with counseling and coordination of care.   Rubie Maid, MD [facility]

## 2019-06-08 NOTE — Patient Instructions (Addendum)
Anterior and Posterior Colporrhaphy  Anterior or posterior colporrhaphy is surgery to fix a prolapse of organs in the genital tract. Prolapse is a condition in which an organ bulges or drops down from its normal position. Organs that commonly prolapse include the rectum, bladder, vagina, and uterus. Prolapse can affect a single organ or several organs at the same time.  You may need this surgery if you have a severe prolapse that causes symptoms that interfere with your daily life and cannot be corrected with other treatments. Prolapse often worsens when women stop having their monthly periods (menopause) because estrogen loss weakens the muscles and tissues in the genital tract. Prolapse can also happen when the organs are damaged or weakened. This commonly happens after childbirth and as a result of aging. The type of colporrhaphy done depends on the type of genital prolapse. Types of genital prolapse include the following:  Cystocele. This is a prolapse of the bladder and the upper part of the front (anterior) wall of the vagina.  Rectocele. This is a prolapse of the rectum and the lower part of the back (posterior) wall of the vagina.  Enterocele. This is a prolapse of the small intestine. It appears as a bulge under the neck of the uterus at the top of the back wall of the vagina.  Procidentia. This is a complete prolapse of the uterus and the cervix. The prolapse can be seen and felt coming out of the vagina. Tell a health care provider about:  Any allergies you have.  All medicines you are taking, including vitamins, herbs, eye drops, creams, and over-the-counter medicines.  Any problems you or family members have had with anesthetic medicines.  Any blood disorders you have.  Any surgeries you have had.  Any medical conditions you have.  Smoking history or history of alcohol use.  Whether you are pregnant or may be pregnant. What are the risks? Generally, this is a safe  procedure. However, problems may occur, including:  Infection.  Bleeding.  Allergic reactions to medicines.  Damage to other structures or organs.  Problems urinating.  Incontinence.  Nerve damage.  Painful sex.  Constipation.  A blood clot that travels to your lungs. What happens before the procedure? Staying hydrated Follow instructions from your health care provider about hydration, which may include:  Up to 2 hours before the procedure - you may continue to drink clear liquids, such as water, clear fruit juice, black coffee, and plain tea. Eating and drinking restrictions Follow instructions from your health care provider about eating and drinking, which may include:  8 hours before the procedure - stop eating heavy meals or foods such as meat, fried foods, or fatty foods.  6 hours before the procedure - stop eating light meals or foods, such as toast or cereal.  6 hours before the procedure - stop drinking milk or drinks that contain milk.  2 hours before the procedure - stop drinking clear liquids. General instructions  Ask your health care provider about: ? Changing or stopping your regular medicines. This is especially important if you are taking diabetes medicines or blood thinners. ? Taking medicines such as aspirin and ibuprofen. These medicines can thin your blood. Do not take these medicines before your procedure if your health care provider instructs you not to.  You may be given antibiotics to help prevent infection.  You may be instructed to use estrogen cream in your vagina to help prevent complications and promote healing.  Do not use  any products that contain nicotine or tobacco, such as cigarettes and e-cigarettes, for at least 2 weeks before the procedure. If you need help quitting, ask your health care provider.  Plan to have someone take you home from the hospital. Also, arrange for someone to help you with activities during recovery. What  happens during the procedure?  To lower your risk of infection: ? Your health care team will wash or sanitize their hands. ? Your skin will be washed with soap. ? Hair may be removed from the surgical area.  An IV will be inserted into one of your veins.  You will be given one or more of the following: ? A medicine to help you relax (sedative). ? A medicine to make you fall asleep (general anesthetic).  You may be given antibiotics through your IV.  You will lie down on the operating table with your feet in stirrups.  A small, thin tube (catheter) will be inserted through your urethra into your bladder to drain urine during surgery and recovery.  An instrument (vaginal speculum) will be used to hold your vagina open.  Your health care provider will perform the procedure according to the type of repair you require: Anterior repair  An incision will be made in the midline section of the front part of the vaginal wall.  A triangular-shaped piece of vaginal tissue will be removed.  The stronger, healthier tissue will be sewn together in order to support the bladder.  These incisions may be closed with stitches (sutures).  Gauze packing will be placed inside your vagina. Posterior repair  An incision will be made midline on the back wall of the vagina.  A triangular portion of vaginal skin will be removed to expose the muscle.  Excess tissue will be removed, and stronger, healthier muscle and ligament tissue will be sewn together to support the rectum.  These incisions may be closed with stitches (sutures).  Gauze packing will be placed inside your vagina. Anterior and posterior repair  Both procedures will be done during the same surgery. The procedure may vary among health care providers and hospitals. What happens after the procedure?  Your blood pressure, heart rate, breathing rate, and blood oxygen level will be monitored until the medicines you were given have worn  off.  You will be given pain medicine as needed.  You will have a small tube in place to drain your bladder (urinary catheter). This will be in place until your bladder is working properly on its own.  You may have a gauze packing in your vagina for a few days to prevent bleeding.  You will start on a liquid diet and slowly move to a regular diet.  You will be encouraged to get up and walk as soon as you are able.  You may need to wear compression stockings. They help prevent blood clots and reduce swelling in your legs.  Do not drive for 24 hours if you were given a sedative. Summary  Anterior or posterior colporrhaphy is surgery to fix a prolapse of organs in the genital tract.  The type of repair done depends on the type of prolapse that is present.  Follow instructions from your health care provider about eating and drinking before the procedure.  You will be given a general anesthetic to make you fall asleep during the procedure. This information is not intended to replace advice given to you by your health care provider. Make sure you discuss any questions you have  with your health care provider. Document Released: 02/19/2004 Document Revised: 11/11/2017 Document Reviewed: 01/06/2017 Elsevier Patient Education  2020 [employer].

## 2019-09-17 ENCOUNTER — Other Ambulatory Visit: Payer: Self-pay | Admitting: Obstetrics and Gynecology

## 2019-09-26 IMAGING — MG MM DIGITAL SCREENING UNILAT*L* W/ CAD
2 series · 2 of 2 positions shown · non-contrast
Comparison: With priors

CLINICAL DATA: Screening. History of right mastectomy.

EXAM:
DIGITAL SCREENING UNILATERAL LEFT MAMMOGRAM WITH CAD

[L MLO]
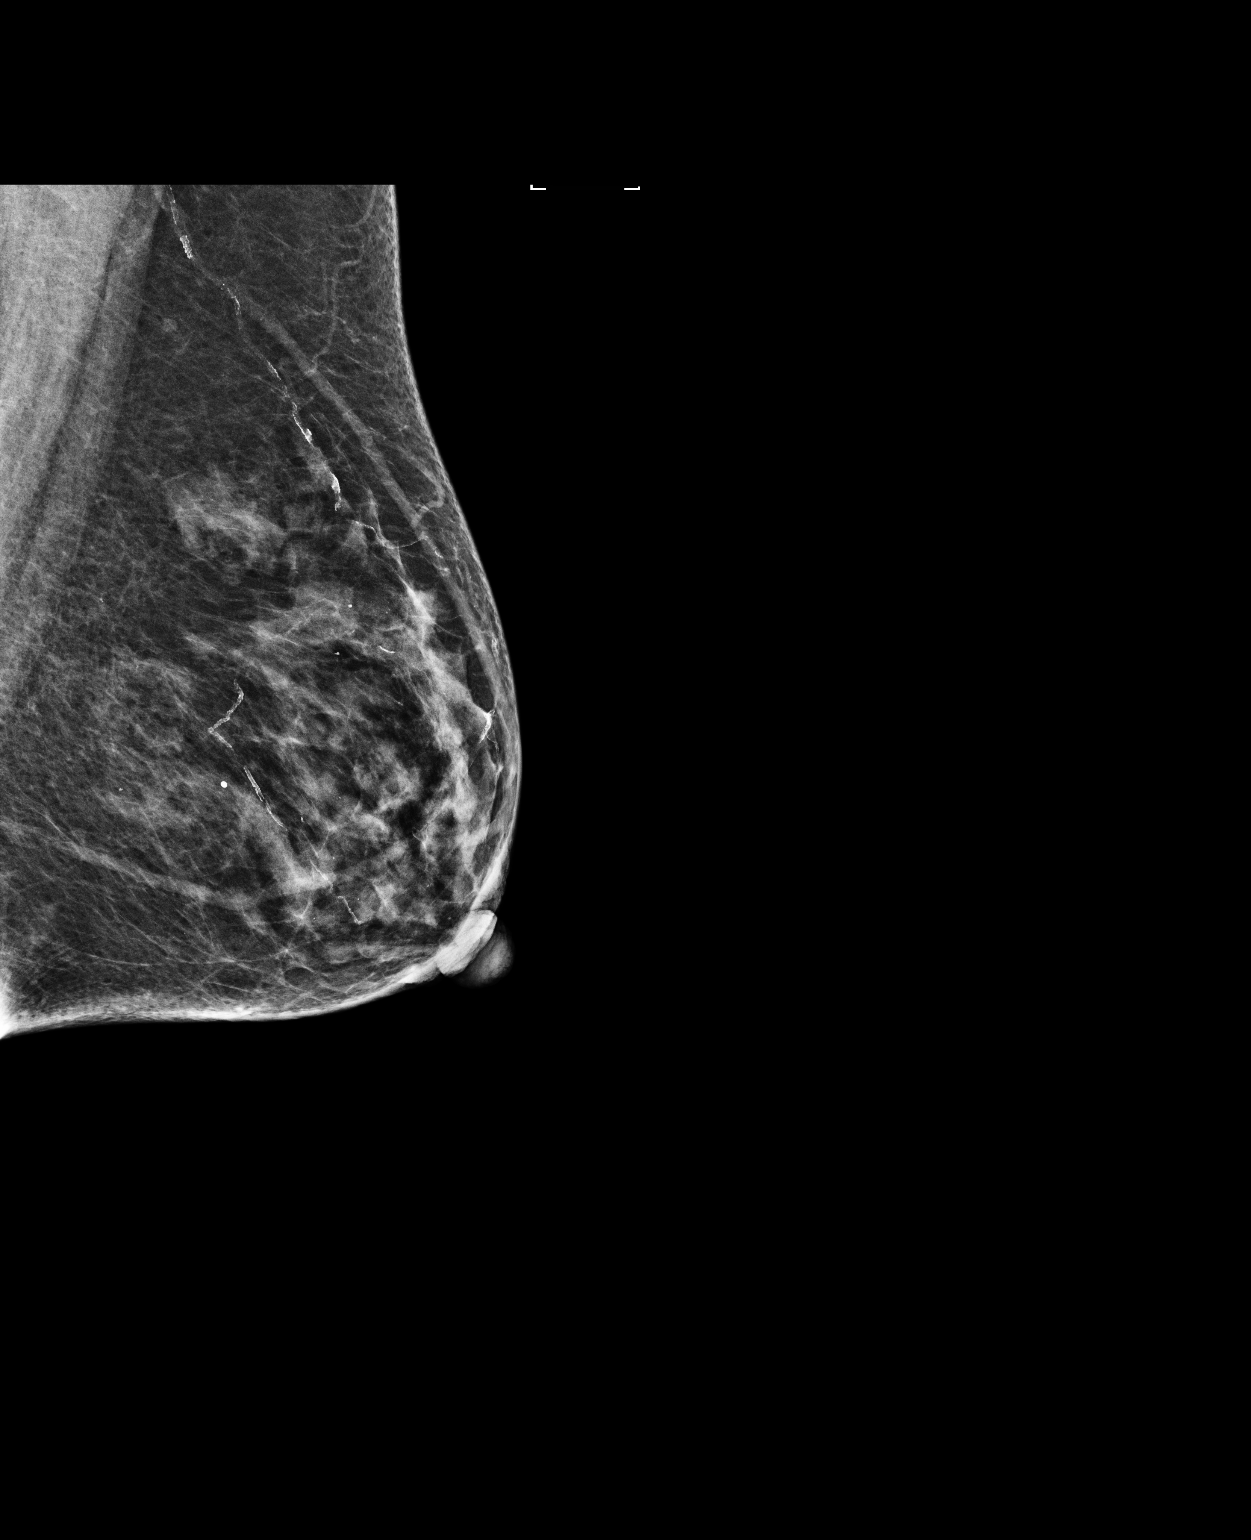

[L CC]
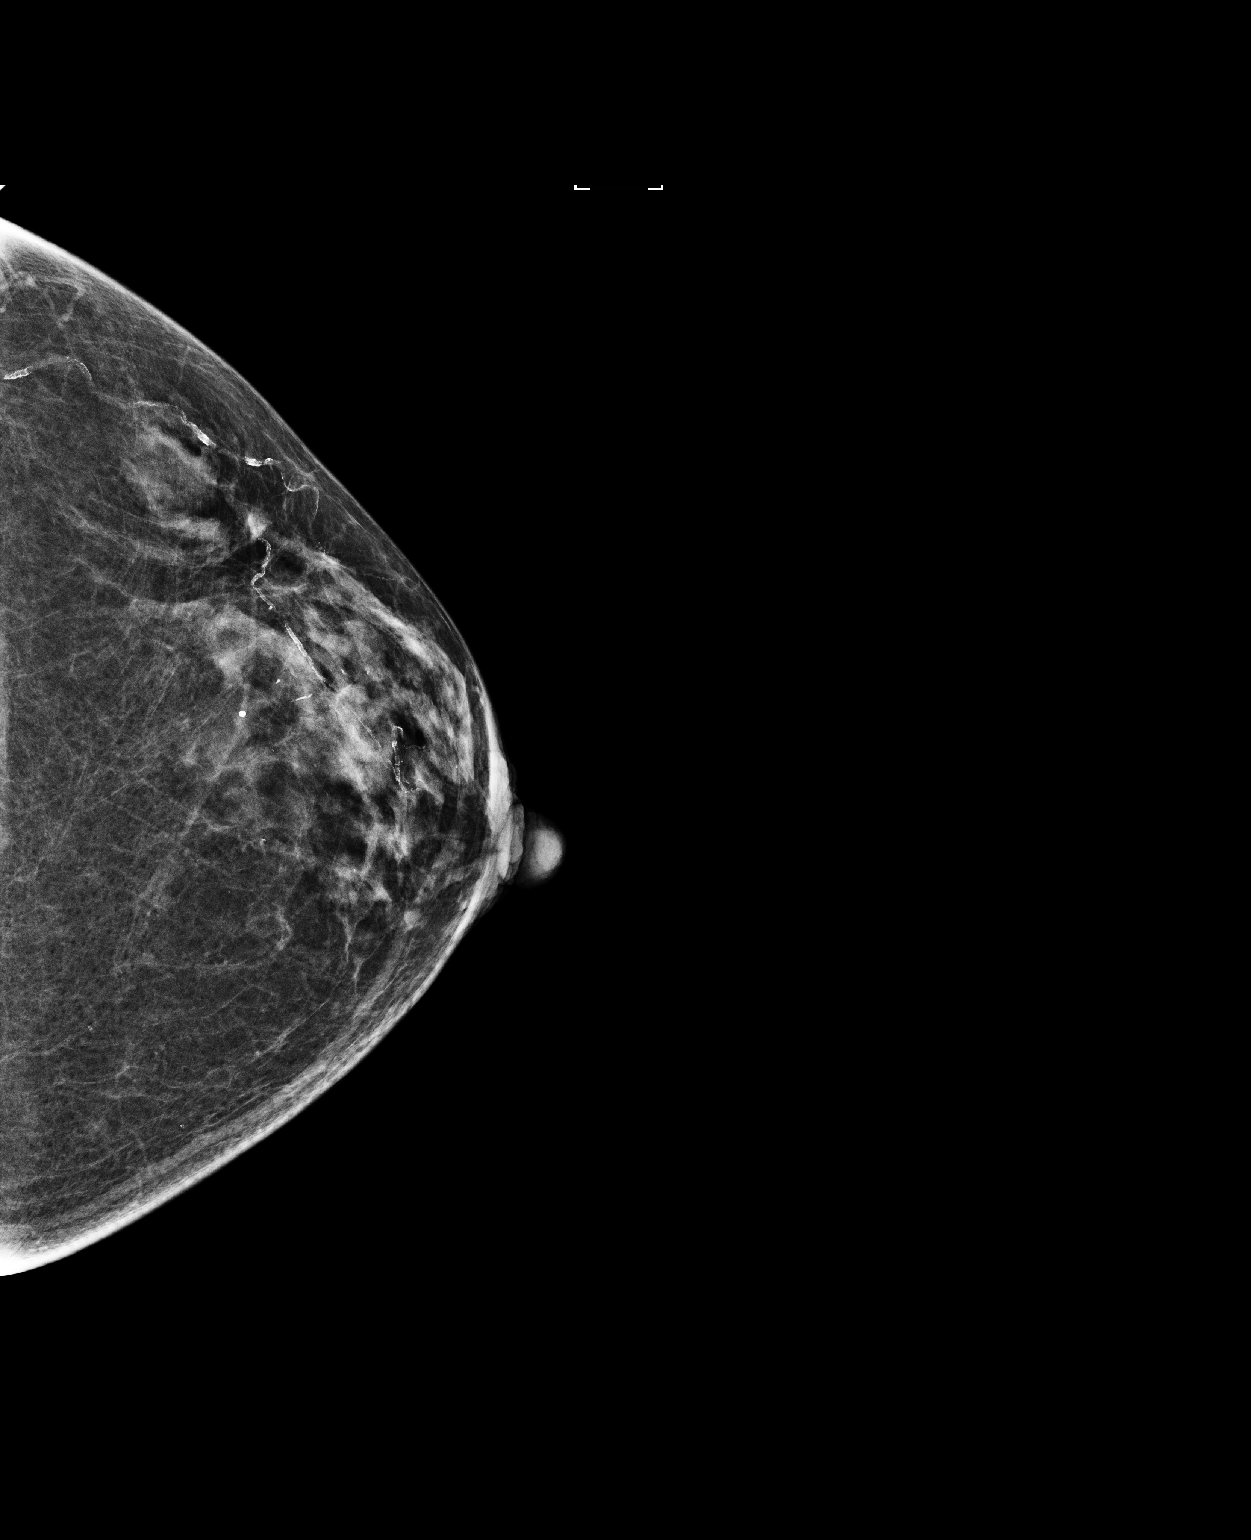

[2 of 2 positions shown; findings below may reference images not displayed]

ACR Breast Density Category c: The breast tissue is heterogeneously
dense, which may obscure small masses
FINDINGS: There are no findings suspicious for malignancy. Images were
processed with CAD.
IMPRESSION: No mammographic evidence of malignancy. A result letter of this
screening mammogram will be mailed directly to the patient.

RECOMMENDATION:
Screening mammogram in one year. (Code:S6-J-ZTL)

BI-RADS CATEGORY  1: Negative.

## 2019-10-03 ENCOUNTER — Ambulatory Visit: Payer: Medicare Other | Admitting: Urology

## 2019-10-22 ENCOUNTER — Emergency Department: Payer: Medicare Other

## 2019-10-22 ENCOUNTER — Emergency Department
Admission: EM | Admit: 2019-10-22 | Discharge: 2019-10-22 | Disposition: A | Discharge status: Home / Self Care | Payer: Medicare Other | Attending: Emergency Medicine | Admitting: Emergency Medicine

## 2019-10-22 ENCOUNTER — Other Ambulatory Visit: Payer: Self-pay

## 2019-10-22 ENCOUNTER — Encounter: Payer: Self-pay | Admitting: Emergency Medicine

## 2019-10-22 DIAGNOSIS — F419 Anxiety disorder, unspecified: Secondary | ICD-10-CM | POA: Diagnosis not present

## 2019-10-22 DIAGNOSIS — Z853 Personal history of malignant neoplasm of breast: Secondary | ICD-10-CM | POA: Insufficient documentation

## 2019-10-22 DIAGNOSIS — Z794 Long term (current) use of insulin: Secondary | ICD-10-CM | POA: Diagnosis not present

## 2019-10-22 DIAGNOSIS — R079 Chest pain, unspecified: Secondary | ICD-10-CM | POA: Diagnosis present

## 2019-10-22 DIAGNOSIS — I10 Essential (primary) hypertension: Secondary | ICD-10-CM | POA: Insufficient documentation

## 2019-10-22 DIAGNOSIS — E119 Type 2 diabetes mellitus without complications: Secondary | ICD-10-CM | POA: Insufficient documentation

## 2019-10-22 DIAGNOSIS — N390 Urinary tract infection, site not specified: Secondary | ICD-10-CM | POA: Diagnosis not present

## 2019-10-22 DIAGNOSIS — Z7982 Long term (current) use of aspirin: Secondary | ICD-10-CM | POA: Insufficient documentation

## 2019-10-22 DIAGNOSIS — Z79899 Other long term (current) drug therapy: Secondary | ICD-10-CM | POA: Diagnosis not present

## 2019-10-22 LAB — CBC WITH DIFFERENTIAL/PLATELET
Abs Immature Granulocytes: 0.05 10*3/uL (ref 0.00–0.07)
Basophils Absolute: 0.1 10*3/uL (ref 0.0–0.1)
Basophils Relative: 0 %
Eosinophils Absolute: 0.1 10*3/uL (ref 0.0–0.5)
Eosinophils Relative: 1 %
HCT: 38.9 % (ref 36.0–46.0)
Hemoglobin: 13.4 g/dL (ref 12.0–15.0)
Immature Granulocytes: 0 %
Lymphocytes Relative: 15 %
Lymphs Abs: 1.8 10*3/uL (ref 0.7–4.0)
MCH: 29.6 pg (ref 26.0–34.0)
MCHC: 34.4 g/dL (ref 30.0–36.0)
MCV: 86.1 fL (ref 80.0–100.0)
Monocytes Absolute: 1.1 10*3/uL — ABNORMAL HIGH (ref 0.1–1.0)
Monocytes Relative: 9 %
Neutro Abs: 8.7 10*3/uL — ABNORMAL HIGH (ref 1.7–7.7)
Neutrophils Relative %: 75 %
Platelets: 381 10*3/uL (ref 150–400)
RBC: 4.52 MIL/uL (ref 3.87–5.11)
RDW: 12.3 % (ref 11.5–15.5)
WBC: 11.7 10*3/uL — ABNORMAL HIGH (ref 4.0–10.5)
nRBC: 0 % (ref 0.0–0.2)

## 2019-10-22 LAB — COMPREHENSIVE METABOLIC PANEL
ALT: 12 U/L (ref 0–44)
AST: 22 U/L (ref 15–41)
Albumin: 3.9 g/dL (ref 3.5–5.0)
Alkaline Phosphatase: 45 U/L (ref 38–126)
Anion gap: 10 (ref 5–15)
BUN: 11 mg/dL (ref 8–23)
CO2: 24 mmol/L (ref 22–32)
Calcium: 9.6 mg/dL (ref 8.9–10.3)
Chloride: 96 mmol/L — ABNORMAL LOW (ref 98–111)
Creatinine, Ser: 0.64 mg/dL (ref 0.44–1.00)
GFR calc Af Amer: >60 mL/min (ref >60)
GFR calc non Af Amer: >60 mL/min (ref >60)
Glucose, Bld: 123 mg/dL — ABNORMAL HIGH (ref 70–99)
Potassium: 4 mmol/L (ref 3.5–5.1)
Sodium: 130 mmol/L — ABNORMAL LOW (ref 135–145)
Total Bilirubin: 1 mg/dL (ref 0.3–1.2)
Total Protein: 7.1 g/dL (ref 6.5–8.1)

## 2019-10-22 LAB — URINALYSIS, COMPLETE (UACMP) WITH MICROSCOPIC: Specific Gravity, Urine: 1.013 (ref 1.005–1.030)

## 2019-10-22 LAB — TROPONIN I (HIGH SENSITIVITY): Troponin I (High Sensitivity): 4 ng/L (ref <18)

## 2019-10-22 LAB — LACTIC ACID, PLASMA: Lactic Acid, Venous: 1 mmol/L (ref 0.5–1.9)

## 2019-10-22 MED ORDER — NITROFURANTOIN MONOHYD MACRO 100 MG PO CAPS
100.0000 mg | ORAL_CAPSULE | Freq: Once | ORAL | Status: AC
  Administered 2019-10-22: 19:00:00 100 mg via ORAL
  Filled 2019-10-22: qty 1

## 2019-10-22 MED ORDER — ALPRAZOLAM 0.5 MG PO TABS: 0.5000 mg | ORAL_TABLET | Freq: Every day | ORAL | 0 refills | Status: AC | PRN

## 2019-10-22 MED ORDER — ALPRAZOLAM 0.5 MG PO TABS
0.5000 mg | ORAL_TABLET | Freq: Once | ORAL | Status: AC
  Administered 2019-10-22: 0.5 mg via ORAL
  Filled 2019-10-22: qty 1

## 2019-10-22 MED ORDER — NITROFURANTOIN MONOHYD MACRO 100 MG PO CAPS: 100.0000 mg | ORAL_CAPSULE | Freq: Two times a day (BID) | ORAL | 0 refills | Status: AC

## 2019-10-22 NOTE — ED Triage Notes (Signed)
Pt reports some pressure in her chest. Pt also reports has a UTI and is taking OTC meds for it. Pt reports chest was tight this am when she woke up. Pt reports has been upset and anxious about the election.

## 2019-10-22 NOTE — ED Provider Notes (Signed)
[facility] Emergency Department Provider Note  Time seen: 6:22 PM  I have reviewed the triage vital signs and the nursing notes.   HISTORY  Chief Complaint Chest Pain, Nausea, Polyuria, and Urinary Frequency   HPI [name] is a 78 y.o. female with a past medical history of diabetes, gastric reflux, hypertension, anxiety, presents to the emergency department for chest tightness, feeling anxious and urinary frequency and urgency.   Patient states she has been very anxious about the election which is being causing her to be very upset.  States she has been experiencing chest tightness at times and feels very "nervous."  She also states for the past 3 days she has been experiencing urinary urgency and frequency with very slight dysuria consistent with urinary tract infection she has experienced in the past.  Has been taking Pyridium for this without relief.  Denies any fever.  Denies any cough or shortness of breath.  Past Medical History:  Diagnosis Date  . Arthritis   . Breast cancer ([place]) 2000  . Cancer ([place])   . Diabetes mellitus   . GERD (gastroesophageal reflux disease)   . Hypertension   . Lymph edema    Rt arm  . Neuropathy   . Osteoporosis   . Personal history of chemotherapy 2000   right breast  . Personal history of radiation therapy 2000   right breast  . Thyroid disease     Patient Active Problem List   Diagnosis Date Noted  . Hyponatremia 03/01/2018  . Cellulitis of arm, right 02/11/2018  . SIRS (systemic inflammatory response syndrome) ([place]) 02/11/2018  . Back pain 02/11/2018  . Cellulitis 02/11/2018  . Bilateral leg edema 03/29/2017  . Female cystocele 12/07/2016  . Urinary frequency 12/07/2016  . Vaginal atrophy 12/07/2016  . Stress incontinence 09/24/2016  . External hemorrhoid 03/24/2016  . Hypothyroidism 03/24/2016  . Fatigue 10/21/2015  . HLD (hyperlipidemia) 04/28/2015  . Osteopenia 04/28/2015  . 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 HIATAL HERNIA 02/10/2010  . ALLERGY, ENVIRONMENTAL 04/29/2009  . Malignant neoplasm of female breast ([place]) 04/26/2007  . Diabetes ([place]) 04/26/2007  . Essential hypertension 04/26/2007    Past Surgical History:  Procedure Laterality Date  . ANTERIOR AND POSTERIOR REPAIR N/A 10/18/2016   Procedure: ANTERIOR (CYSTOCELE) AND POSTERIOR REPAIR (RECTOCELE);  Surgeon: Rubie Maid, MD;  Location: [facility] ORS;  Service: Gynecology;  Laterality: N/A;  . APPENDECTOMY    . BACK SURGERY  1983   discectomy  . BREAST EXCISIONAL BIOPSY Left 12/11/2010   benign.  BENIGN BREAST TISSUE WITH STROMAL FIBROSIS, SCLEROSING ADENOSIS   . cartoid u/s  0/34/74   25-95% LICA stenosis  . COLONOSCOPY    . DOPPLER ECHOCARDIOGRAPHY  02/24/06   Triv MR, EF 55-60%  . [place]   bilateral  . HEMORROIDECTOMY    . MASTECTOMY Right 2000   right, modified radical+ chemo, 2/23 nodes 4/03  . miscarriage  1970  . mumps  1965   h/o  . OTHER SURGICAL HISTORY  04/03/02   dexa, wnl  . OTHER SURGICAL HISTORY  03/12/04   dexa, wnl  . OTHER SURGICAL HISTORY  08/12/05   dexa-osteopor Hip, osteopen spine  . OTHER SURGICAL HISTORY  10/25/07   dexa- nml hip and spine  . SEPTOPLASTY  1970  . TONSILLECTOMY  1945  . VAGINAL DELIVERY     x5  . VAGINAL HYSTERECTOMY  1974   partial, fibroids  .  VENTRAL HERNIA REPAIR      Prior to Admission medications   Medication Sig Start Date End Date Taking? Authorizing Provider  ACCU-CHEK AVIVA PLUS test strip USE 1 STRIP TWICE A DAY TO CHECK BLOOD SUGAR 11/18/17   Lucille Passy, MD  acetaminophen (TYLENOL 8 HOUR) 650 MG CR tablet Take 1 tablet (650 mg total) by mouth every 8 (eight) hours as needed for pain. 10/19/16   Rubie Maid, MD  ALPRAZolam Duanne Moron) 0.5 MG tablet Take 1 tablet (0.5 mg total) by mouth daily as needed for anxiety. 10/22/19 11/21/19  Harvest Dark, MD  aspirin 81 MG tablet Take 81 mg by mouth at bedtime.     [provider]  BD PEN NEEDLE NANO U/F 32G X 4 MM MISC USE 1 PEN NEEDLE DAILY 04/04/17   Lucille Passy, MD  Blood Glucose Monitoring Suppl (ACCU-CHEK AVIVA PLUS) W/DEVICE KIT Use to check blood sugar twice daily     [provider]  clobetasol cream (TEMOVATE) 5.88 % Apply 1 application topically 2 (two) times daily. For 1 week, then decrease to as needed . Patient not taking: Reported on 06/08/2019 12/26/18   Rubie Maid, MD  docusate sodium (COLACE) 100 MG capsule Take 1 capsule (100 mg total) by mouth 2 (two) times daily as needed for mild constipation. 10/19/16   Rubie Maid, MD  ezetimibe (ZETIA) 10 MG tablet TAKE ONE (1) TABLET EACH DAY 09/19/17   Lucille Passy, MD  Insulin Glargine (LANTUS SOLOSTAR) 100 UNIT/ML Solostar Pen INJECT 26 UNITS INTO THE SKIN AT BEDTIME. 11/10/17   Lucille Passy, MD  Lancets (ACCU-CHEK MULTICLIX) lancets Use as instructed to check blood sugar twice daily E11.9 06/28/16   Lucille Passy, MD  levothyroxine (SYNTHROID, LEVOTHROID) 75 MCG tablet Take 75 mcg by mouth daily before breakfast.    [provider]  liraglutide (VICTOZA) 18 MG/3ML SOPN INJECT 1.8 MG INTO THE SKIN DAILY. 11/10/17   Lucille Passy, MD  metFORMIN (GLUCOPHAGE) 500 MG tablet Take 500 mg by mouth daily with lunch.    [provider]  Multiple Vitamins-Minerals (CENTRUM SILVER PO) Take 1 tablet by mouth daily.      [provider]  MYRBETRIQ 25 MG TB24 tablet TAKE ONE TABLET BY MOUTH EVERY DAY 09/17/19   Rubie Maid, MD  naproxen sodium (ANAPROX) 220 MG tablet Take 440 mg by mouth daily as needed.     [provider]  nitrofurantoin, macrocrystal-monohydrate, (MACROBID) 100 MG capsule Take 1 capsule (100 mg total) by mouth 2 (two) times daily for 7 days. 10/22/19 10/29/19  Harvest Dark, MD  Omega-3 Fatty Acids (FISH OIL PO) Take 1 capsule by mouth daily.     [provider]  omeprazole (PRILOSEC) 20 MG capsule TAKE ONE (1) CAPSULE EACH DAY  07/04/17   Lucille Passy, MD  OVER THE COUNTER MEDICATION Take 1 tablet by mouth daily. Immune System Support Supplement includes :  Echinacea Zinc Vitamin C    [provider]  POTASSIUM PO Take 1 tablet by mouth at bedtime.    [provider]  valsartan (DIOVAN) 80 MG tablet Take 80 mg by mouth daily.    [provider]    Allergies  Allergen Reactions  . Atorvastatin Swelling    Leg swelling Other reaction(s): Unknown Leg swelling Leg swelling  . Azithromycin Diarrhea and Nausea And Vomiting    Other reaction(s): Nausea And Vomiting  . Ciprofloxacin Other (See Comments)    Does  not remember Other reaction(s): Unknown tendonitis tendonitis  . Hydrocodone-Homatropine     REACTION:   . Hydrocodone-Homatropine     Other reaction(s): Other (See Comments) Drowsiness Other reaction(s): Other (See Comments) REACTION:  . Rosiglitazone Swelling    Other reaction(s): Unknown REACTION: SWELLING  . Rosiglitazone Maleate     REACTION: SWELLING    Family History  Problem Relation Age of Onset  . Diabetes Mother   . Hypertension Mother   . Heart attack Mother   . Heart failure Father   . Hypertension Father   . Stroke Father   . Lung cancer Brother        hx of smoking  . Stroke Brother   . Heart attack Brother        PTCA MI x 2  . Breast cancer Paternal Aunt        paternal great aunt    Social History Social History   Tobacco Use  . Smoking status: Never Smoker  . Smokeless tobacco: Never Used  Substance Use Topics  . Alcohol use: No    Alcohol/week: 0.0 standard drinks  . Drug use: No    Review of Systems Constitutional: Negative for fever. Cardiovascular: Intermittent chest tightness Respiratory: Negative for shortness of breath. Gastrointestinal: Negative for abdominal pain Genitourinary: Positive for urgency and frequency Musculoskeletal: Negative for musculoskeletal complaints Neurological: Negative for headache All other  ROS negative  ____________________________________________   PHYSICAL EXAM:  VITAL SIGNS: ED Triage Vitals [10/22/19 1336]  Enc Vitals Group     BP (!) 149/97     Pulse Rate 84     Resp 20     Temp 98.4 F (36.9 C)     Temp Source Oral     SpO2 98 %     Weight 182 lb (82.6 kg)     Height '5\' 5"'  (1.651 m)     Head Circumference      Peak Flow      Pain Score 7     Pain Loc      Pain Edu?      Excl. in [place]?     Constitutional: Alert and oriented. Well appearing and in no distress. Eyes: Normal exam ENT      Head: Normocephalic and atraumatic.      Mouth/Throat: Mucous membranes are moist. Cardiovascular: Normal rate, regular rhythm.  Respiratory: Normal respiratory effort without tachypnea nor retractions. Breath sounds are clear  Gastrointestinal: Soft and nontender. No distention.   Musculoskeletal: Nontender with normal range of motion in all extremities.  Neurologic:  Normal speech and language. No gross focal neurologic deficits  Skin:  Skin is warm, dry and intact.  Psychiatric: Mood and affect are normal.  ____________________________________________    EKG  EKG viewed and interpreted by myself shows normal sinus rhythm at 78 bpm with a narrow QRS, normal axis, normal intervals, no concerning ST changes.  ____________________________________________    RADIOLOGY  Chest x-ray negative  ____________________________________________   INITIAL IMPRESSION / ASSESSMENT AND PLAN / ED COURSE  Pertinent labs & imaging results that were available during my care of the patient were reviewed by me and considered in my medical decision making (see chart for details).   Patient presents to the emergency department for chest tightness, feeling nervous and having urinary frequency/urgency.  Patient appears mildly anxious during my evaluation, patient's work-up is largely reassuring urinalysis could possibly be consistent with urinary tract infection although the  Pyridium makes it more difficult to interpret.  We  will send a urine culture and cover with antibiotics.  Reassuringly patient's troponin is negative with a reassuring EKG and negative chest x-ray.  Highly suspect anxiety be the cause of the patient's symptoms.  Patient states she has been prescribed Xanax in the past had a prescription bottle but it was 80 years old so she did not try it.  We will dose Xanax in the emergency department and prescribe a short course 0.5 mg tablets going home patient will follow up with her doctor Dr. Edwina Barth.  Patient agreeable to plan of care.  [name] was evaluated in Emergency Department on 10/22/2019 for the symptoms described in the history of present illness. She was evaluated in the context of the global COVID-19 pandemic, which necessitated consideration that the patient might be at risk for infection with the SARS-CoV-2 virus that causes COVID-19. Institutional protocols and algorithms that pertain to the evaluation of patients at risk for COVID-19 are in a state of rapid change based on information released by regulatory bodies including the CDC and federal and state organizations. These policies and algorithms were followed during the patient's care in the ED.  ____________________________________________   FINAL CLINICAL IMPRESSION(S) / ED DIAGNOSES  Anxiety Urinary tract infection   Harvest Dark, MD 10/22/19 8182

## 2019-10-22 NOTE — ED Provider Notes (Signed)
[name] Emergency Department Provider Note  ____________________________________________   None    (approximate)   I have reviewed the triage vital signs and the nursing notes.   Patient has been triaged with a MSE exam performed by myself at a minimum. Based on symptoms and screening exam, patient may receive a more in-depth exam, labs, imaging as detailed below. Patients have been advised of this setting and exam type at the time of patient interview.    HISTORY  Chief Complaint Chest Pain, Nausea, Polyuria, and Urinary Frequency    HPI [name] is a 78 y.o. female presents to the emergency department with a complaint of chest pain, dysuria, polyuria. Patient complaining several days of UTI symptoms. HX recurrent UTI. No fever, abd pain, flank pain. No hematuria. Patient taking AZO for symptom relief.  Patient complaining of chest pressure/pain starting this morning. No cardiac HX. No radiation. Patient reports HX of anxiety and reports feeling anxious with recent political events. No sob. No palpitations.  No HA, neck pain, abd pain, N/V/D.   Patient will receive a medical screening exam as detailed below.  Based off of this exam, more in depth exam, labs, imaging will be performed as needed for complaint.  Patient care will be eventually transferred to another provider in the emergency department for final exam, diagnosis and disposition.    Past Medical History:  Diagnosis Date  . Arthritis   . Breast cancer ([place]) 2000  . Cancer ([place])   . Diabetes mellitus   . GERD (gastroesophageal reflux disease)   . Hypertension   . Lymph edema    Rt arm  . Neuropathy   . Osteoporosis   . Personal history of chemotherapy 2000   right breast  . Personal history of radiation therapy 2000   right breast  . Thyroid disease     Patient Active Problem List   Diagnosis Date Noted  . Hyponatremia 03/01/2018  . Cellulitis of arm, right  02/11/2018  . SIRS (systemic inflammatory response syndrome) ([place]) 02/11/2018  . Back pain 02/11/2018  . Cellulitis 02/11/2018  . Bilateral leg edema 03/29/2017  . Female cystocele 12/07/2016  . Urinary frequency 12/07/2016  . Vaginal atrophy 12/07/2016  . Stress incontinence 09/24/2016  . External hemorrhoid 03/24/2016  . Hypothyroidism 03/24/2016  . Fatigue 10/21/2015  . HLD (hyperlipidemia) 04/28/2015  . Osteopenia 04/28/2015  . 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 HIATAL HERNIA 02/10/2010  . ALLERGY, ENVIRONMENTAL 04/29/2009  . Malignant neoplasm of female breast ([place]) 04/26/2007  . Diabetes ([place]) 04/26/2007  . Essential hypertension 04/26/2007    Past Surgical History:  Procedure Laterality Date  . ANTERIOR AND POSTERIOR REPAIR N/A 10/18/2016   Procedure: ANTERIOR (CYSTOCELE) AND POSTERIOR REPAIR (RECTOCELE);  Surgeon: Rubie Maid, MD;  Location: [facility] ORS;  Service: Gynecology;  Laterality: N/A;  . APPENDECTOMY    . BACK SURGERY  1983   discectomy  . BREAST EXCISIONAL BIOPSY Left 12/11/2010   benign.  BENIGN BREAST TISSUE WITH STROMAL FIBROSIS, SCLEROSING ADENOSIS   . cartoid u/s  9/73/53   29-92% LICA stenosis  . COLONOSCOPY    . DOPPLER ECHOCARDIOGRAPHY  02/24/06   Triv MR, EF 55-60%  . [place]   bilateral  . HEMORROIDECTOMY    . MASTECTOMY Right 2000   right, modified radical+ chemo, 2/23 nodes 4/03  . miscarriage  1970  . mumps  1965   h/o  . OTHER SURGICAL HISTORY  04/03/02  dexa, wnl  . OTHER SURGICAL HISTORY  03/12/04   dexa, wnl  . OTHER SURGICAL HISTORY  08/12/05   dexa-osteopor Hip, osteopen spine  . OTHER SURGICAL HISTORY  10/25/07   dexa- nml hip and spine  . SEPTOPLASTY  1970  . TONSILLECTOMY  1945  . VAGINAL DELIVERY     x5  . VAGINAL HYSTERECTOMY  1974   partial, fibroids  . VENTRAL HERNIA REPAIR      Prior to Admission medications   Medication Sig Start Date End Date Taking? Authorizing Provider  ACCU-CHEK  AVIVA PLUS test strip USE 1 STRIP TWICE A DAY TO CHECK BLOOD SUGAR 11/18/17   Lucille Passy, MD  acetaminophen (TYLENOL 8 HOUR) 650 MG CR tablet Take 1 tablet (650 mg total) by mouth every 8 (eight) hours as needed for pain. 10/19/16   Rubie Maid, MD  aspirin 81 MG tablet Take 81 mg by mouth at bedtime.     [provider]  BD PEN NEEDLE NANO U/F 32G X 4 MM MISC USE 1 PEN NEEDLE DAILY 04/04/17   Lucille Passy, MD  Blood Glucose Monitoring Suppl (ACCU-CHEK AVIVA PLUS) W/DEVICE KIT Use to check blood sugar twice daily     [provider]  clobetasol cream (TEMOVATE) 4.54 % Apply 1 application topically 2 (two) times daily. For 1 week, then decrease to as needed . Patient not taking: Reported on 06/08/2019 12/26/18   Rubie Maid, MD  docusate sodium (COLACE) 100 MG capsule Take 1 capsule (100 mg total) by mouth 2 (two) times daily as needed for mild constipation. 10/19/16   Rubie Maid, MD  ezetimibe (ZETIA) 10 MG tablet TAKE ONE (1) TABLET EACH DAY 09/19/17   Lucille Passy, MD  Insulin Glargine (LANTUS SOLOSTAR) 100 UNIT/ML Solostar Pen INJECT 26 UNITS INTO THE SKIN AT BEDTIME. 11/10/17   Lucille Passy, MD  Lancets (ACCU-CHEK MULTICLIX) lancets Use as instructed to check blood sugar twice daily E11.9 06/28/16   Lucille Passy, MD  levothyroxine (SYNTHROID, LEVOTHROID) 75 MCG tablet Take 75 mcg by mouth daily before breakfast.    [provider]  liraglutide (VICTOZA) 18 MG/3ML SOPN INJECT 1.8 MG INTO THE SKIN DAILY. 11/10/17   Lucille Passy, MD  metFORMIN (GLUCOPHAGE) 500 MG tablet Take 500 mg by mouth daily with lunch.    [provider]  Multiple Vitamins-Minerals (CENTRUM SILVER PO) Take 1 tablet by mouth daily.      [provider]  MYRBETRIQ 25 MG TB24 tablet TAKE ONE TABLET BY MOUTH EVERY DAY 09/17/19   Rubie Maid, MD  naproxen sodium (ANAPROX) 220 MG tablet Take 440 mg by mouth daily as needed.     [provider]  Omega-3 Fatty Acids (FISH  OIL PO) Take 1 capsule by mouth daily.     [provider]  omeprazole (PRILOSEC) 20 MG capsule TAKE ONE (1) CAPSULE EACH DAY 07/04/17   Lucille Passy, MD  OVER THE COUNTER MEDICATION Take 1 tablet by mouth daily. Immune System Support Supplement includes :  Echinacea Zinc Vitamin C    [provider]  POTASSIUM PO Take 1 tablet by mouth at bedtime.    [provider]  valsartan (DIOVAN) 80 MG tablet Take 80 mg by mouth daily.    [provider]    Allergies Atorvastatin, Azithromycin, Ciprofloxacin, Hydrocodone-homatropine, Hydrocodone-homatropine, Rosiglitazone, and Rosiglitazone maleate  Family History  Problem Relation Age of Onset  . Diabetes Mother   . Hypertension Mother   .  Heart attack Mother   . Heart failure Father   . Hypertension Father   . Stroke Father   . Lung cancer Brother        hx of smoking  . Stroke Brother   . Heart attack Brother        PTCA MI x 2  . Breast cancer Paternal Aunt        paternal great aunt    Social History Social History   Tobacco Use  . Smoking status: Never Smoker  . Smokeless tobacco: Never Used  Substance Use Topics  . Alcohol use: No    Alcohol/week: 0.0 standard drinks  . Drug use: No    Review of Systems Constitutional: no fever ENT: no nasal congestion/rhinorhea. no sore throat Cardiovascular: positive chest pain. Respiratory: no cough. no shortness of breath/difficulty breathing Gastroenterology: no abdominal pain Genitourinary: Positive dysuria, polyuria. No hematuria Musculoskeletal: no for musculoskeletal pain Integumentary: Negative for rash. Neurological: No focal weakness nor numbness.   ____________________________________________   PHYSICAL EXAM:  VITAL SIGNS: ED Triage Vitals [10/22/19 1336]  Enc Vitals Group     BP (!) 149/97     Pulse Rate 84     Resp 20     Temp 98.4 F (36.9 C)     Temp Source Oral     SpO2 98 %     Weight 182 lb (82.6 kg)     Height  _0  (1.651 m)     Head Circumference      Peak Flow      Pain Score 7     Pain Loc      Pain Edu?      Excl. in [place]?     Constitutional: Alert and oriented. Generally well appearing and in no acute distress. Eyes: Conjunctivae are normal.  Nose: No significant congestion/rhinnorhea. Mouth: No gross oropharyngeal edema. no erythema/edema Neck: No stridor.  No meningeal signs.   Cardiovascular: Grossly normal heart sounds. Respiratory: Normal respiratory effort without significant tachypnea and no observed retractions. Lungs CTAB Gastrointestinal: No significant visible abdominal wall findings.  Bowel sounds x4 quadrants. No abdominal or CVA tenderness to palpation. Musculoskeletal: No gross deformities of extremities. Neurologic:  Normal speech and language. No gross focal neurologic deficits are appreciated.  Skin:  Skin is warm, dry and intact. No rash noted.    ____________________________________________   LABS (all labs ordered are listed, but only abnormal results are displayed)  Labs Reviewed  URINE CULTURE  URINALYSIS, COMPLETE (UACMP) WITH MICROSCOPIC  COMPREHENSIVE METABOLIC PANEL  LACTIC ACID, PLASMA  LACTIC ACID, PLASMA  CBC WITH DIFFERENTIAL/PLATELET  TROPONIN I (HIGH SENSITIVITY)    ____________________________________________   RADIOLOGY   Official radiology report(s): No results found.  ____________________________________________    INITIAL IMPRESSION / MDM / ASSESSMENT AND PLAN / ED COURSE  As part of my medical decision making, I reviewed the following data within the electronic MEDICAL RECORD NUMBER Notes from prior ED visits      Clinical Impression: chest pressure, dysuria   Plan: EKG, chest xray, labs  Patient has been screened based based on their arrival complaint, evaluated for an emergent condition, and at a minimum has received a medical screening exam.  At this time, patient will receive the further work-up listed above that was  determined by medical screening exam.  Patient care will be transferred to another provider in the emergency department once patient is roomed for final diagnosis and disposition.    ____________________________________________  Note:  This  document was prepared using Systems analyst and may include unintentional dictation errors.    Darletta Moll, PA-C 10/22/19 1346    Earleen Newport, MD 10/22/19 1534

## 2019-10-22 NOTE — ED Triage Notes (Signed)
Pt comes into the ED via EMS from home with c/o chest tightness since yesterday , hx of anxiety, also having uti sx. 324mg  ASA at home, #22g [place], 176/84, SR, pt is in NAD on arrival

## 2019-10-27 LAB — URINE CULTURE
Culture: >=100000 — AB
Special Requests: NORMAL

## 2019-10-28 NOTE — Progress Notes (Addendum)
ED Antimicrobial Stewardship Positive Culture Follow Up   [name] is an 78 y.o. female who presented to [facility] on 10/22/2019 with a chief complaint of urinary symptoms and chest pain.  Chief Complaint  Patient presents with  . Chest Pain  . Nausea  . Polyuria  . Urinary Frequency    Recent Results (from the past 720 hour(s))  Urine culture     Status: Abnormal   Collection Time: 10/22/19  1:49 PM   Specimen: Urine, Random  Result Value Ref Range Status   Specimen Description   Final    URINE, RANDOM Performed at [facility], [address], [place], [place] [address]    Special Requests   Final    Normal Performed at [facility], [address] [place], [place] [address]    Culture >=100,000 COLONIES/mL VIRIDANS STREPTOCOCCUS (A)  Final   Report Status 10/27/2019 FINAL  Final   78 year old female with PMH of breast canccer, diabetes, GERD, hypertension, and osteoporosis.  She was recently discharged from the ED with nitrofurantoin for UTI.  ED culture resulted showing viridans Streptococcus, which is not covered by nitrofurantoin.  She takes Myrbetriq for urinary incontinence, but reports more frequent urinary symptoms than baseline (including nocturia 4-5x at night), as well as increased urge to urinate.  Patient instructed to stop taking nitrofurantoin and begin taking cefpodoxime.  Prescription was called into [facility] in [place], [place].  [x]  Treated with nitrofurantoin, organism resistant to prescribed antimicrobial  New antibiotic prescription: Cefpodoxime 100 mg PO BID x 7 days.  ED Provider: Dr. Dondra Spry, PharmD Pharmacy Resident  10/28/2019 3:57 PM '

## 2019-11-02 ENCOUNTER — Other Ambulatory Visit: Payer: Self-pay | Admitting: Internal Medicine

## 2019-11-02 DIAGNOSIS — I6522 Occlusion and stenosis of left carotid artery: Secondary | ICD-10-CM

## 2019-11-02 DIAGNOSIS — F411 Generalized anxiety disorder: Secondary | ICD-10-CM

## 2019-11-12 ENCOUNTER — Other Ambulatory Visit: Payer: Self-pay

## 2019-11-12 ENCOUNTER — Ambulatory Visit
Admission: RE | Admit: 2019-11-12 | Discharge: 2019-11-12 | Disposition: A | Discharge status: Home / Self Care | Payer: Medicare Other | Source: Ambulatory Visit | Attending: Internal Medicine | Admitting: Internal Medicine

## 2019-11-12 DIAGNOSIS — F411 Generalized anxiety disorder: Secondary | ICD-10-CM | POA: Diagnosis present

## 2019-11-12 DIAGNOSIS — I6522 Occlusion and stenosis of left carotid artery: Secondary | ICD-10-CM | POA: Diagnosis present

## 2019-11-15 ENCOUNTER — Other Ambulatory Visit: Payer: Self-pay | Admitting: Family Medicine

## 2019-11-15 DIAGNOSIS — I6523 Occlusion and stenosis of bilateral carotid arteries: Secondary | ICD-10-CM

## 2019-11-20 ENCOUNTER — Other Ambulatory Visit: Payer: Self-pay

## 2019-11-20 DIAGNOSIS — N390 Urinary tract infection, site not specified: Secondary | ICD-10-CM

## 2019-11-23 ENCOUNTER — Other Ambulatory Visit: Payer: Self-pay

## 2019-11-23 ENCOUNTER — Encounter: Payer: Self-pay | Admitting: Urology

## 2019-11-23 ENCOUNTER — Ambulatory Visit: Payer: Medicare Other | Admitting: Urology

## 2019-11-23 ENCOUNTER — Other Ambulatory Visit
Admission: RE | Admit: 2019-11-23 | Discharge: 2019-11-23 | Disposition: A | Discharge status: Home / Self Care | Payer: Medicare Other | Attending: Urology | Admitting: Urology

## 2019-11-23 VITALS — BP 155/82 | HR 88 | Ht 65.0 in | Wt 190.0 lb

## 2019-11-23 DIAGNOSIS — N390 Urinary tract infection, site not specified: Secondary | ICD-10-CM | POA: Insufficient documentation

## 2019-11-23 DIAGNOSIS — N3281 Overactive bladder: Secondary | ICD-10-CM | POA: Diagnosis not present

## 2019-11-23 DIAGNOSIS — R300 Dysuria: Secondary | ICD-10-CM | POA: Diagnosis not present

## 2019-11-23 DIAGNOSIS — N8110 Cystocele, unspecified: Secondary | ICD-10-CM | POA: Diagnosis not present

## 2019-11-23 LAB — URINALYSIS, COMPLETE (UACMP) WITH MICROSCOPIC
Bilirubin Urine: NEGATIVE
Glucose, UA: NEGATIVE mg/dL
Hgb urine dipstick: NEGATIVE
Ketones, ur: NEGATIVE mg/dL
Leukocytes,Ua: NEGATIVE
Nitrite: POSITIVE — AB
Protein, ur: NEGATIVE mg/dL
Specific Gravity, Urine: 1.02 (ref 1.005–1.030)
pH: 6 (ref 5.0–8.0)

## 2019-11-23 LAB — BLADDER SCAN AMB NON-IMAGING

## 2019-11-23 NOTE — Patient Instructions (Addendum)
Tracking Your Bladder Symptoms    Patient Name:___________________________________________________   Sample: Day   Daytime Voids  Nighttime Voids Urgency for the Day(0-4) Number of Accidents Beverage Comments  Monday IIII II 2 I Water IIII Coffee  I      Week Starting:____________________________________   Day Daytime  Voids Nighttime  Voids Urgency for  The Day(0-4) Number of Accidents Beverages Comments                                                           This week my symptoms were:  O much better  O better O the same O worse    Recommendations today are the following:  1.  Agree with taking cranberry tablets/d-mannose supplements twice daily  2.  Please keep a voiding diary for 1 to 2 days.  Please record every daytime and nighttime urination including the volumes and urgency.  Also keep track of what you are drinking.  Bring this to your next follow-up appointment with Dr. Arther Dames.  3.  We will go ahead and increase her Myrbetriq to 50 mg.  You were given samples x1 month today.  4.  I am not certain that you have urinary tract infections and suspect that these are likely contaminated samples.  If you have signs or symptoms of urinary tract infection, I would like you to come to the office for same-day visit for catheterized specimen with our PA.  5.  For your vaginal/urethral burning, you may do well with an over-the-counter topical lubricant like vaginasil.

## 2019-11-23 NOTE — Progress Notes (Signed)
11/23/2019 4:00 PM   [name] [birth year] [id number]  Referring provider: Baxter Hire, MD [place],  [place] [address]  Chief Complaint  Patient presents with  . Recurrent UTI    New Patient    HPI: 78 year old female who presents today accompanied by her daughter for history of recurrent urinary tract infections.  She reports today that she has been miserable for the past several months.  She is avoiding with severe frequency which is going on for quite some time.  She is now to the point where she gets up every hour to urinate.  Her symptoms are both daytime and nighttime.  No alleviating or exacerbating symptoms.  She is anxious about all of this.  She denies any gross hematuria.  Review of extensive urinalysis/urine culture reveal multiple fairly bland appearing urinalysis with positive urine cultures of varying bacteria.  She reports that when she gets a specimen, she often will void into a hat if she is not able to collect a clean sample herself.  She is never been catheterized for specimen.  She reports that her symptoms that she associates with infection is urinary frequency with burning.  She reports that her burning is not associated with voiding and is somewhat external and constant.  She is also worried today that she is becoming dehydrated because she voids so frequently.  She drinks plenty of fluids.  She drinks Gatorade 0 and occasionally 1 Diet Coke.  She has had 5 vaginal deliveries with children ranging from 9 to 12 pounds.  She blames them for her prolapse.  She reports that she has vaginal bulging sometimes past her vaginal opening.  Is very bothersome to her.  She has been taking Myrbetriq 25 mg daily for quite some time.  She does not believe this helped much.   PMH: Past Medical History:  Diagnosis Date  . Arthritis   . Breast cancer ([place]) 2000  . Cancer ([place])   . Diabetes mellitus   . GERD (gastroesophageal reflux disease)    . Hypertension   . Lymph edema    Rt arm  . Neuropathy   . Osteoporosis   . Personal history of chemotherapy 2000   right breast  . Personal history of radiation therapy 2000   right breast  . Thyroid disease     Surgical History: Past Surgical History:  Procedure Laterality Date  . ANTERIOR AND POSTERIOR REPAIR N/A 10/18/2016   Procedure: ANTERIOR (CYSTOCELE) AND POSTERIOR REPAIR (RECTOCELE);  Surgeon: Rubie Maid, MD;  Location: [facility] ORS;  Service: Gynecology;  Laterality: N/A;  . APPENDECTOMY    . BACK SURGERY  1983   discectomy  . BREAST EXCISIONAL BIOPSY Left 12/11/2010   benign.  BENIGN BREAST TISSUE WITH STROMAL FIBROSIS, SCLEROSING ADENOSIS   . cartoid u/s  2/95/18   84-16% LICA stenosis  . COLONOSCOPY    . DOPPLER ECHOCARDIOGRAPHY  02/24/06   [facility] MR, EF 55-60%  . [place]   bilateral  . HEMORROIDECTOMY    . MASTECTOMY Right 2000   right, modified radical+ chemo, 2/23 nodes 4/03  . miscarriage  1970  . mumps  1965   h/o  . OTHER SURGICAL HISTORY  04/03/02   dexa, wnl  . OTHER SURGICAL HISTORY  03/12/04   dexa, wnl  . OTHER SURGICAL HISTORY  08/12/05   dexa-osteopor Hip, osteopen spine  . OTHER SURGICAL HISTORY  10/25/07   dexa- nml hip and spine  .  SEPTOPLASTY  1970  . TONSILLECTOMY  1945  . VAGINAL DELIVERY     x5  . VAGINAL HYSTERECTOMY  1974   partial, fibroids  . VENTRAL HERNIA REPAIR      Home Medications:  Allergies as of 11/23/2019      Reactions   Atorvastatin Swelling   Leg swelling Other reaction(s): Unknown Leg swelling Leg swelling   Azithromycin Diarrhea, Nausea And Vomiting   Other reaction(s): Nausea And Vomiting   Ciprofloxacin Other (See Comments)   Does not remember Other reaction(s): Unknown tendonitis tendonitis   Hydrocodone-homatropine    REACTION:    Hydrocodone-homatropine    Other reaction(s): Other (See Comments) Drowsiness Other reaction(s): Other (See Comments) REACTION:   Rosiglitazone  Swelling   Other reaction(s): Unknown REACTION: SWELLING   Rosiglitazone Maleate    REACTION: SWELLING      Medication List       Accurate as of November 23, 2019  4:00 PM. If you have any questions, ask your nurse or doctor.        Accu-Chek Aviva Plus test strip Generic drug: glucose blood USE 1 STRIP TWICE A DAY TO CHECK BLOOD SUGAR   Accu-Chek Aviva Plus w/Device Kit Use to check blood sugar twice daily   accu-chek multiclix lancets Use as instructed to check blood sugar twice daily E11.9   acetaminophen 650 MG CR tablet Commonly known as: Tylenol 8 Hour Take 1 tablet (650 mg total) by mouth every 8 (eight) hours as needed for pain.   aspirin 81 MG tablet Take 81 mg by mouth at bedtime.   BD Pen Needle Nano U/F 32G X 4 MM Misc Generic drug: Insulin Pen Needle USE 1 PEN NEEDLE DAILY   CENTRUM SILVER PO Take 1 tablet by mouth daily.   clobetasol cream 0.05 % Commonly known as: TEMOVATE Apply 1 application topically 2 (two) times daily. For 1 week, then decrease to as needed .   docusate sodium 100 MG capsule Commonly known as: COLACE Take 1 capsule (100 mg total) by mouth 2 (two) times daily as needed for mild constipation.   ezetimibe 10 MG tablet Commonly known as: ZETIA TAKE ONE (1) TABLET EACH DAY   FISH OIL PO Take 1 capsule by mouth daily.   Insulin Glargine 100 UNIT/ML Solostar Pen Commonly known as: Lantus SoloStar INJECT 26 UNITS INTO THE SKIN AT BEDTIME.   levothyroxine 75 MCG tablet Commonly known as: SYNTHROID Take 75 mcg by mouth daily before breakfast.   liraglutide 18 MG/3ML Sopn Commonly known as: Victoza INJECT 1.8 MG INTO THE SKIN DAILY.   metFORMIN 500 MG tablet Commonly known as: GLUCOPHAGE Take 500 mg by mouth daily with lunch.   Myrbetriq 25 MG Tb24 tablet Generic drug: mirabegron ER TAKE ONE TABLET BY MOUTH EVERY DAY   naproxen sodium 220 MG tablet Commonly known as: ALEVE Take 440 mg by mouth daily as needed.     omeprazole 20 MG capsule Commonly known as: PRILOSEC TAKE ONE (1) CAPSULE EACH DAY   OVER THE COUNTER MEDICATION Take 1 tablet by mouth daily. Immune System Support Supplement includes :  Echinacea Zinc Vitamin C   POTASSIUM PO Take 1 tablet by mouth at bedtime.   valsartan 80 MG tablet Commonly known as: DIOVAN Take 80 mg by mouth daily.       Allergies:  Allergies  Allergen Reactions  . Atorvastatin Swelling    Leg swelling Other reaction(s): Unknown Leg swelling Leg swelling  . Azithromycin Diarrhea and Nausea And  Vomiting    Other reaction(s): Nausea And Vomiting  . Ciprofloxacin Other (See Comments)    Does not remember Other reaction(s): Unknown tendonitis tendonitis  . Hydrocodone-Homatropine     REACTION:   . Hydrocodone-Homatropine     Other reaction(s): Other (See Comments) Drowsiness Other reaction(s): Other (See Comments) REACTION:  . Rosiglitazone Swelling    Other reaction(s): Unknown REACTION: SWELLING  . Rosiglitazone Maleate     REACTION: SWELLING    Family History: Family History  Problem Relation Age of Onset  . Diabetes Mother   . Hypertension Mother   . Heart attack Mother   . Heart failure Father   . Hypertension Father   . Stroke Father   . Lung cancer Brother        hx of smoking  . Stroke Brother   . Heart attack Brother        PTCA MI x 2  . Breast cancer Paternal Aunt        paternal great aunt    Social History:  reports that she has never smoked. She has never used smokeless tobacco. She reports that she does not drink alcohol or use drugs.  ROS: UROLOGY Frequent Urination?: Yes Hard to postpone urination?: Yes Burning/pain with urination?: Yes Get up at night to urinate?: Yes Leakage of urine?: No Urine stream starts and stops?: Yes Trouble starting stream?: No Do you have to strain to urinate?: No Blood in urine?: No Urinary tract infection?: Yes Sexually transmitted disease?: No Injury to kidneys or  bladder?: No Painful intercourse?: No Weak stream?: No Currently pregnant?: No Vaginal bleeding?: No Last menstrual period?: n  Gastrointestinal Nausea?: No Vomiting?: No Indigestion/heartburn?: No Diarrhea?: No Constipation?: No  Constitutional Fever: No Night sweats?: No Weight loss?: No Fatigue?: Yes  Skin Skin rash/lesions?: No Itching?: No  Eyes Blurred vision?: No Double vision?: No  Ears/Nose/Throat Sore throat?: No Sinus problems?: No  Hematologic/Lymphatic Swollen glands?: No Easy bruising?: No  Cardiovascular Leg swelling?: No Chest pain?: No  Respiratory Cough?: No Shortness of breath?: No  Endocrine Excessive thirst?: Yes  Musculoskeletal Back pain?: No Joint pain?: Yes  Neurological Headaches?: No Dizziness?: No  Psychologic Depression?: No Anxiety?: Yes  Physical Exam: BP (!) 155/82   Pulse 88   Ht 5' 5" (1.651 m)   Wt 190 lb (86.2 kg)   BMI 31.62 kg/m   Constitutional:  Alert and oriented, No acute distress. HEENT: [place] AT, moist mucus membranes.  Trachea midline, no masses. Cardiovascular: No clubbing, cyanosis, or edema. Respiratory: Normal respiratory effort, no increased work of breathing. Skin: No rashes, bruises or suspicious lesions. Neurologic: Grossly intact, no focal deficits, moving all 4 extremities. Psychiatric: Normal mood and affect.  Laboratory Data: Lab Results  Component Value Date   WBC 11.7 (H) 10/22/2019   HGB 13.4 10/22/2019   HCT 38.9 10/22/2019   MCV 86.1 10/22/2019   PLT 381 10/22/2019    Lab Results  Component Value Date   CREATININE 0.64 10/22/2019     Lab Results  Component Value Date   HGBA1C 7.1 (H) 03/01/2018    Pertinent Imaging: Results for orders placed or performed during the hospital encounter of 11/23/19  Urinalysis, Complete w Microscopic (For [facility] ONLY)  Result Value Ref Range   Color, Urine AMBER (A) YELLOW   APPearance CLEAR CLEAR   Specific Gravity, Urine  1.020 1.005 - 1.030   pH 6.0 5.0 - 8.0   Glucose, UA NEGATIVE NEGATIVE mg/dL   Hgb urine dipstick  NEGATIVE NEGATIVE   Bilirubin Urine NEGATIVE NEGATIVE   Ketones, ur NEGATIVE NEGATIVE mg/dL   Protein, ur NEGATIVE NEGATIVE mg/dL   Nitrite POSITIVE (A) NEGATIVE   Leukocytes,Ua NEGATIVE NEGATIVE   Squamous Epithelial / LPF 0-5 0 - 5   WBC, UA 0-5 0 - 5 WBC/hpf   RBC / HPF 0-5 0 - 5 RBC/hpf   Bacteria, UA MANY (A) NONE SEEN    Assessment & Plan:    1. Frequent UTI - Bladder Scan (Post Void Residual) in office - Urine culture; Future  2. OAB (overactive bladder)   3. Bladder prolapse, female, acquired  4. Dysuria   Recommendations today are the following:  1.  Agree with taking cranberry tablets/d-mannose supplements twice daily  2.  Please keep a voiding diary for 1 to 2 days.  Please record every daytime and nighttime urination including the volumes and urgency.  Also keep track of what you are drinking.  Bring this to your next follow-up appointment with Dr. Matilde Sprang.  Based on her clinical history, she likely has significant pelvic organ prolapse.  I offered pelvic exam today but given that she is going to follow-up with my partner, it can be done on this day rather than twice.  3.  We will go ahead and increase her Myrbetriq to 50 mg.  You were given samples x1 month today.  4.  I am not certain that you have urinary tract infections and suspect that these are likely contaminated samples.  If you have signs or symptoms of urinary tract infection, I would like you to come to the office for same-day visit for catheterized specimen with our PA.  5.  For your vaginal/urethral burning, you may do well with an over-the-counter topical lubricant like vaginasil.     Hollice Espy, MD  [facility] [address], [place], [place] [address] [phone]   Referral to Dr. Matilde Sprang  for further evaluation based on her history of what  sounds like significant pelvic organ prolapse and OAB  I spent 45 min with this patient of which greater than 50% was spent in counseling and coordination of care with the patient.

## 2019-11-26 ENCOUNTER — Telehealth: Payer: Self-pay | Admitting: *Deleted

## 2019-11-26 NOTE — Telephone Encounter (Signed)
She just needs to bring it to her to her follow-up appointment.  It may be also useful for her to repeat it one time just prior to her follow-up visit to see if the higher dose of medication samples has helped.  Hollice Espy, MD

## 2019-11-26 NOTE — Telephone Encounter (Signed)
Patient informed voiced understanding.

## 2019-11-26 NOTE — Telephone Encounter (Addendum)
Patient informed-voiced understanding. She has her voiding diary complete, her results were: Voided  12 times during the day and 8 night on 11/24/19 Voided  10 times daily  8 night  11/25/19 Please advise  ----- Message from Hollice Espy, MD sent at 11/24/2019  2:04 PM EST ----- Please let her know that her catheter is crystal-clear.  It is nitrite positive from the Pyridium but otherwise is unremarkable.  I continue to be highly suspicious that previous urinalysis were contaminated from voiding in the past.  If she develops signs or symptoms of urinary tract infection, she needs to be catheterized by [place] in the office.  Hollice Espy, MD

## 2019-12-17 ENCOUNTER — Encounter: Payer: Self-pay | Admitting: Urology

## 2019-12-17 ENCOUNTER — Ambulatory Visit: Payer: Medicare PPO | Admitting: Urology

## 2019-12-17 ENCOUNTER — Other Ambulatory Visit: Payer: Self-pay

## 2019-12-17 VITALS — BP 164/76 | HR 88 | Ht 65.0 in | Wt 184.0 lb

## 2019-12-17 DIAGNOSIS — N3020 Other chronic cystitis without hematuria: Secondary | ICD-10-CM

## 2019-12-17 MED ORDER — MIRABEGRON ER 50 MG PO TB24: 50.0000 mg | ORAL_TABLET | Freq: Every day | ORAL | 11 refills | Status: DC

## 2019-12-17 MED ORDER — TRIMETHOPRIM 100 MG PO TABS: 100.0000 mg | ORAL_TABLET | Freq: Every day | ORAL | 11 refills | Status: DC

## 2019-12-17 NOTE — Progress Notes (Signed)
12/17/2019 11:13 AM   [name] [birth year] [id number]  Referring provider: Baxter Hire, MD [place],  [place] [address]  Chief Complaint  Patient presents with  . Urinary Incontinence    HPI: Was consulted by Dr. Erlene Quan to assist the patient's possible recurrent bladder infections and frequent bladder.  She voids every 1 hour at night and sometimes 2 hours.  If she holds it longer she can have foot on the floor syndrome and wears a pad because of this.  She does not have bedwetting urge incontinence or stress incontinence.  She voids 10-11 times a day.  She describes tingling sensation and feeling poorly when she gets a bladder infection for 5 times a year that respond favorably to antibiotics.  She likely has had prolapse surgery.  She is an insulin-dependent diabetic.  She has had a hysterectomy  She has not had a kidney stone and bowel movements are normal.  She has had low back surgery  Modifying factors: There are no other modifying factors  Associated signs and symptoms: There are no other associated signs and symptoms Aggravating and relieving factors: There are no other aggravating or relieving factors Severity: Moderate Duration: Persistent   PMH: Past Medical History:  Diagnosis Date  . Arthritis   . Breast cancer ([place]) 2000  . Cancer ([place])   . Diabetes mellitus   . GERD (gastroesophageal reflux disease)   . Hypertension   . Lymph edema    Rt arm  . Neuropathy   . Osteoporosis   . Personal history of chemotherapy 2000   right breast  . Personal history of radiation therapy 2000   right breast  . Thyroid disease     Surgical History: Past Surgical History:  Procedure Laterality Date  . ANTERIOR AND POSTERIOR REPAIR N/A 10/18/2016   Procedure: ANTERIOR (CYSTOCELE) AND POSTERIOR REPAIR (RECTOCELE);  Surgeon: Rubie Maid, MD;  Location: [facility] ORS;  Service: Gynecology;  Laterality: N/A;  . APPENDECTOMY    . BACK SURGERY   1983   discectomy  . BREAST EXCISIONAL BIOPSY Left 12/11/2010   benign.  BENIGN BREAST TISSUE WITH STROMAL FIBROSIS, SCLEROSING ADENOSIS   . cartoid u/s  6/31/49   70-26% LICA stenosis  . COLONOSCOPY    . DOPPLER ECHOCARDIOGRAPHY  02/24/06   Triv MR, EF 55-60%  . [place]   bilateral  . HEMORROIDECTOMY    . MASTECTOMY Right 2000   right, modified radical+ chemo, 2/23 nodes 4/03  . miscarriage  1970  . mumps  1965   h/o  . OTHER SURGICAL HISTORY  04/03/02   dexa, wnl  . OTHER SURGICAL HISTORY  03/12/04   dexa, wnl  . OTHER SURGICAL HISTORY  08/12/05   dexa-osteopor Hip, osteopen spine  . OTHER SURGICAL HISTORY  10/25/07   dexa- nml hip and spine  . SEPTOPLASTY  1970  . TONSILLECTOMY  1945  . VAGINAL DELIVERY     x5  . VAGINAL HYSTERECTOMY  1974   partial, fibroids  . VENTRAL HERNIA REPAIR      Home Medications:  Allergies as of 12/17/2019      Reactions   Atorvastatin Swelling   Leg swelling Other reaction(s): Unknown Leg swelling Leg swelling   Azithromycin Diarrhea, Nausea And Vomiting   Other reaction(s): Nausea And Vomiting   Ciprofloxacin Other (See Comments)   Does not remember Other reaction(s): Unknown tendonitis tendonitis   Hydrocodone-homatropine    REACTION:  Hydrocodone-homatropine    Other reaction(s): Other (See Comments) Drowsiness Other reaction(s): Other (See Comments) REACTION:   Rosiglitazone Swelling   Other reaction(s): Unknown REACTION: SWELLING   Rosiglitazone Maleate    REACTION: SWELLING      Medication List       Accurate as of December 17, 2019 11:13 AM. If you have any questions, ask your nurse or doctor.        Accu-Chek Aviva Plus test strip Generic drug: glucose blood USE 1 STRIP TWICE A DAY TO CHECK BLOOD SUGAR   Accu-Chek Aviva Plus w/Device Kit Use to check blood sugar twice daily   accu-chek multiclix lancets Use as instructed to check blood sugar twice daily E11.9   acetaminophen 650 MG CR  tablet Commonly known as: Tylenol 8 Hour Take 1 tablet (650 mg total) by mouth every 8 (eight) hours as needed for pain.   aspirin 81 MG tablet Take 81 mg by mouth at bedtime.   BD Pen Needle Nano U/F 32G X 4 MM Misc Generic drug: Insulin Pen Needle USE 1 PEN NEEDLE DAILY   CENTRUM SILVER PO Take 1 tablet by mouth daily.   clobetasol cream 0.05 % Commonly known as: TEMOVATE Apply 1 application topically 2 (two) times daily. For 1 week, then decrease to as needed .   docusate sodium 100 MG capsule Commonly known as: COLACE Take 1 capsule (100 mg total) by mouth 2 (two) times daily as needed for mild constipation.   ezetimibe 10 MG tablet Commonly known as: ZETIA TAKE ONE (1) TABLET EACH DAY   FISH OIL PO Take 1 capsule by mouth daily.   Insulin Glargine 100 UNIT/ML Solostar Pen Commonly known as: Lantus SoloStar INJECT 26 UNITS INTO THE SKIN AT BEDTIME.   levothyroxine 75 MCG tablet Commonly known as: SYNTHROID Take 75 mcg by mouth daily before breakfast.   liraglutide 18 MG/3ML Sopn Commonly known as: Victoza INJECT 1.8 MG INTO THE SKIN DAILY.   metFORMIN 500 MG tablet Commonly known as: GLUCOPHAGE Take 500 mg by mouth daily with lunch.   Myrbetriq 25 MG Tb24 tablet Generic drug: mirabegron ER TAKE ONE TABLET BY MOUTH EVERY DAY What changed:   how much to take  how to take this  when to take this   naproxen sodium 220 MG tablet Commonly known as: ALEVE Take 440 mg by mouth daily as needed.   omeprazole 20 MG capsule Commonly known as: PRILOSEC TAKE ONE (1) CAPSULE EACH DAY   OVER THE COUNTER MEDICATION Take 1 tablet by mouth daily. Immune System Support Supplement includes :  Echinacea Zinc Vitamin C   POTASSIUM PO Take 1 tablet by mouth at bedtime.   valsartan 80 MG tablet Commonly known as: DIOVAN Take 80 mg by mouth daily.       Allergies:  Allergies  Allergen Reactions  . Atorvastatin Swelling    Leg swelling Other reaction(s):  Unknown Leg swelling Leg swelling  . Azithromycin Diarrhea and Nausea And Vomiting    Other reaction(s): Nausea And Vomiting  . Ciprofloxacin Other (See Comments)    Does not remember Other reaction(s): Unknown tendonitis tendonitis  . Hydrocodone-Homatropine     REACTION:   . Hydrocodone-Homatropine     Other reaction(s): Other (See Comments) Drowsiness Other reaction(s): Other (See Comments) REACTION:  . Rosiglitazone Swelling    Other reaction(s): Unknown REACTION: SWELLING  . Rosiglitazone Maleate     REACTION: SWELLING    Family History: Family History  Problem Relation Age of Onset  .  Diabetes Mother   . Hypertension Mother   . Heart attack Mother   . Heart failure Father   . Hypertension Father   . Stroke Father   . Lung cancer Brother        hx of smoking  . Stroke Brother   . Heart attack Brother        PTCA MI x 2  . Breast cancer Paternal Aunt        paternal great aunt    Social History:  reports that she has never smoked. She has never used smokeless tobacco. She reports that she does not drink alcohol or use drugs.  ROS: UROLOGY Frequent Urination?: Yes Hard to postpone urination?: No Burning/pain with urination?: No Get up at night to urinate?: Yes Leakage of urine?: No Urine stream starts and stops?: Yes Trouble starting stream?: Yes Do you have to strain to urinate?: No Blood in urine?: No Urinary tract infection?: No Sexually transmitted disease?: No Injury to kidneys or bladder?: No Painful intercourse?: No Weak stream?: No Currently pregnant?: No Vaginal bleeding?: No Last menstrual period?: n  Gastrointestinal Nausea?: No Vomiting?: No Indigestion/heartburn?: No Diarrhea?: No Constipation?: No  Constitutional Fever: No Night sweats?: No Weight loss?: No Fatigue?: No  Skin Skin rash/lesions?: No Itching?: No  Eyes Blurred vision?: No Double vision?: No  Ears/Nose/Throat Sore throat?: No Sinus problems?:  No  Hematologic/Lymphatic Swollen glands?: No Easy bruising?: No  Cardiovascular Leg swelling?: No Chest pain?: No  Respiratory Cough?: No Shortness of breath?: No  Endocrine Excessive thirst?: No  Musculoskeletal Back pain?: No Joint pain?: No  Neurological Headaches?: No Dizziness?: No  Psychologic Depression?: Yes Anxiety?: Yes  Physical Exam: BP (!) 164/76   Pulse 88   Ht '5\' 5"'  (1.651 m)   Wt 184 lb (83.5 kg)   BMI 30.62 kg/m   Constitutional:  Alert and oriented, No acute distress. HEENT: [place] AT, moist mucus membranes.  Trachea midline, no masses. Cardiovascular: No clubbing, cyanosis, or edema. Respiratory: Normal respiratory effort, no increased work of breathing. GI: Abdomen is soft, nontender, nondistended, no abdominal masses GU: She has a large grade 3 cystocele that reached the introitus associate with central defect.  Vaginal length descended from 8 or 9 cm to approximately 5 cm with Valsalva.  No rectocele.  Grade 2 hypermobility the bladder neck and negative cough test with cystocele reduced Skin: No rashes, bruises or suspicious lesions. Lymph: No cervical or inguinal adenopathy. Neurologic: Grossly intact, no focal deficits, moving all 4 extremities. Psychiatric: Normal mood and affect.  Laboratory Data: Lab Results  Component Value Date   WBC 11.7 (H) 10/22/2019   HGB 13.4 10/22/2019   HCT 38.9 10/22/2019   MCV 86.1 10/22/2019   PLT 381 10/22/2019    Lab Results  Component Value Date   CREATININE 0.64 10/22/2019    No results found for: PSA  No results found for: TESTOSTERONE  Lab Results  Component Value Date   HGBA1C 7.1 (H) 03/01/2018    Urinalysis    Component Value Date/Time   COLORURINE AMBER (A) 11/23/2019 1640   APPEARANCEUR CLEAR 11/23/2019 1640   APPEARANCEUR Clear 11/28/2013 2011   LABSPEC 1.020 11/23/2019 1640   LABSPEC 1.009 11/28/2013 2011   PHURINE 6.0 11/23/2019 1640   GLUCOSEU NEGATIVE 11/23/2019 1640    GLUCOSEU Negative 11/28/2013 2011   HGBUR NEGATIVE 11/23/2019 1640   BILIRUBINUR NEGATIVE 11/23/2019 1640   BILIRUBINUR neg 02/07/2019 1042   BILIRUBINUR Negative 11/28/2013 2011   KETONESUR NEGATIVE  11/23/2019 1640   PROTEINUR NEGATIVE 11/23/2019 1640   UROBILINOGEN 0.2 02/07/2019 1042   NITRITE POSITIVE (A) 11/23/2019 1640   LEUKOCYTESUR NEGATIVE 11/23/2019 1640   LEUKOCYTESUR Negative 11/28/2013 2011    Pertinent Imaging:   Assessment & Plan: Patient likely has recurrent bladder infections.  She has significant frequency and is most bothered by nocturia.  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 .  I reviewed the medical records and her last 2 cultures were positive.  I sent urine today for culture.  Urinalysis abnormal.  Patient will come back for cystoscopy.  Start patient on trimethoprim suppression therapy 100 mg 30x11.  She had a normal CT scan 1 year ago and I did not order a renal ultrasound  Picture drawn.  Role of urodynamics discussed.  If patient ever had surgery she would likely best benefit from a transvaginal vault suspension with cystocele repair and graft.  This is unrelated to her recurrent bladder infections and frequency.  She has significant leg edema and almost for certain has a significant nocturnal diuresis with a 3 pound weight gain in the afternoon.  She kept a voiding diary and she will void 30 ounces during the day and 30 to 40 ounces at night and is drinking a lot of soda  In the absence of hematuria I will not perform cystoscopy at this stage but may in the future.  I will keep her on the Myrbetriq for now and see if she down regulates on trimethoprim.  Fluid modifications discussed.  Nocturnal diuresis discussed.  Percutaneous tibial nerve stimulation option in future I think fluid shifts is significant in her  We will try to reduce total fluid intake by 50%.  She will stay on cranberry and might get a probiotic.  She really thinks Myrbetriq is helping day  and night and prescription and sample sent.  Trimethoprim sent.  Check residual in 9 weeks.  Watchful waiting for prolapse     There are no diagnoses linked to this encounter.  No follow-ups on file.  Reece Packer, MD  [facility] [address] [place], [place] [address] [phone]

## 2019-12-18 ENCOUNTER — Ambulatory Visit: Payer: Medicare Other | Admitting: Urology

## 2019-12-20 ENCOUNTER — Ambulatory Visit: Payer: Medicare Other | Admitting: Family Medicine

## 2019-12-21 ENCOUNTER — Ambulatory Visit: Payer: Medicare Other | Admitting: Urology

## 2020-01-14 ENCOUNTER — Other Ambulatory Visit: Payer: Self-pay | Admitting: Internal Medicine

## 2020-01-14 DIAGNOSIS — Z1231 Encounter for screening mammogram for malignant neoplasm of breast: Secondary | ICD-10-CM

## 2020-01-17 ENCOUNTER — Telehealth: Payer: Self-pay

## 2020-01-17 ENCOUNTER — Ambulatory Visit: Payer: Medicare PPO | Admitting: Physician Assistant

## 2020-01-17 ENCOUNTER — Other Ambulatory Visit: Payer: Self-pay

## 2020-01-17 ENCOUNTER — Encounter: Payer: Self-pay | Admitting: Physician Assistant

## 2020-01-17 VITALS — BP 142/86 | HR 92 | Ht 65.0 in | Wt 184.0 lb

## 2020-01-17 DIAGNOSIS — R350 Frequency of micturition: Secondary | ICD-10-CM

## 2020-01-17 LAB — BLADDER SCAN AMB NON-IMAGING: Scan Result: 117

## 2020-01-17 NOTE — Progress Notes (Signed)
01/17/2020 11:50 AM   [name] Dec 27, 1940 [id number]  CC: Frequency, low back pain  HPI: [name] is a 79 y.o. female who presents today for evaluation of possible UTI. She is an established [facility] patient previously seen by Drs. Erlene Quan and MacDiarmid for recurrent UTI, urinary frequency, and a grade 3 cystocele.  She is on daily trimethoprim prophylaxis and Myrbetriq 50 mg and has decreased her fluid intake to 20 ounces daily.  She reports her urinary symptoms have been significantly improved on her medications.  Today, she reports a 1 day history of increased frequency back to baseline, every 1-2 hours, as well as low back aching.  She denies dysuria and gross hematuria.  She has not taken anything at home for symptom palliation.  In-office UA today positive for nitrites and 1+ leukocyte esterase; urine microscopy with >30 WBCs/HPF and many bacteria. PVR 166m.  PMH: Past Medical History:  Diagnosis Date  . Arthritis   . Breast [place]  . Cancer ([place]   . Diabetes mellitus   . GERD (gastroesophageal reflux disease)   . Hypertension   . Lymph edema    Rt arm  . Neuropathy   . Osteoporosis   . Personal history of chemotherapy 2000   right breast  . Personal history of radiation therapy 2000   right breast  . Thyroid disease     Surgical History: Past Surgical History:  Procedure Laterality Date  . ANTERIOR AND POSTERIOR REPAIR N/A 10/18/2016   Procedure: ANTERIOR (CYSTOCELE) AND POSTERIOR REPAIR (RECTOCELE);  Surgeon: ARubie Maid MD;  Location: [facility] ORS;  Service: Gynecology;  Laterality: N/A;  . APPENDECTOMY    . BACK SURGERY  1983   discectomy  . BREAST EXCISIONAL BIOPSY Left 12/11/2010   benign.  BENIGN BREAST TISSUE WITH STROMAL FIBROSIS, SCLEROSING ADENOSIS   . cartoid u/s  33/76/28  631-51%LICA stenosis  . COLONOSCOPY    . DOPPLER ECHOCARDIOGRAPHY  02/24/06   Triv MR, EF 55-60%  . [place]  bilateral  .  HEMORROIDECTOMY    . MASTECTOMY Right 2000   right, modified radical+ chemo, 2/23 nodes 4/03  . miscarriage  1970  . mumps  1965   h/o  . OTHER SURGICAL HISTORY  04/03/02   dexa, wnl  . OTHER SURGICAL HISTORY  03/12/04   dexa, wnl  . OTHER SURGICAL HISTORY  08/12/05   dexa-osteopor Hip, osteopen spine  . OTHER SURGICAL HISTORY  10/25/07   dexa- nml hip and spine  . SEPTOPLASTY  1970  . TONSILLECTOMY  1945  . VAGINAL DELIVERY     x5  . VAGINAL HYSTERECTOMY  1974   partial, fibroids  . VENTRAL HERNIA REPAIR      Home Medications:  Allergies as of 01/17/2020      Reactions   Atorvastatin Swelling   Leg swelling Other reaction(s): Unknown Leg swelling Leg swelling   Azithromycin Diarrhea, Nausea And Vomiting   Other reaction(s): Nausea And Vomiting   Ciprofloxacin Other (See Comments)   Does not remember Other reaction(s): Unknown tendonitis tendonitis   Hydrocodone-homatropine    REACTION:    Hydrocodone-homatropine    Other reaction(s): Other (See Comments) Drowsiness Other reaction(s): Other (See Comments) REACTION:   Rosiglitazone Swelling   Other reaction(s): Unknown REACTION: SWELLING   Rosiglitazone Maleate    REACTION: SWELLING      Medication List       Accurate as of January 17, 2020 11:50  AM. If you have any questions, ask your nurse or doctor.        Accu-Chek Aviva Plus test strip Generic drug: glucose blood USE 1 STRIP TWICE A DAY TO CHECK BLOOD SUGAR   Accu-Chek Aviva Plus w/Device Kit Use to check blood sugar twice daily   accu-chek multiclix lancets Use as instructed to check blood sugar twice daily E11.9   acetaminophen 650 MG CR tablet Commonly known as: Tylenol 8 Hour Take 1 tablet (650 mg total) by mouth every 8 (eight) hours as needed for pain.   ALPRAZolam 0.5 MG tablet Commonly known as: XANAX Take 0.25 mg by mouth 2 (two) times daily as needed.   aspirin 81 MG tablet Take 81 mg by mouth at bedtime.   BD Pen Needle Nano  U/F 32G X 4 MM Misc Generic drug: Insulin Pen Needle USE 1 PEN NEEDLE DAILY   CENTRUM SILVER PO Take 1 tablet by mouth daily.   clobetasol cream 0.05 % Commonly known as: TEMOVATE Apply 1 application topically 2 (two) times daily. For 1 week, then decrease to as needed .   docusate sodium 100 MG capsule Commonly known as: COLACE Take 1 capsule (100 mg total) by mouth 2 (two) times daily as needed for mild constipation.   ezetimibe 10 MG tablet Commonly known as: ZETIA TAKE ONE (1) TABLET EACH DAY   FISH OIL PO Take 1 capsule by mouth daily.   Insulin Glargine 100 UNIT/ML Solostar Pen Commonly known as: Lantus SoloStar INJECT 26 UNITS INTO THE SKIN AT BEDTIME.   levothyroxine 75 MCG tablet Commonly known as: SYNTHROID Take 75 mcg by mouth daily before breakfast.   liraglutide 18 MG/3ML Sopn Commonly known as: Victoza INJECT 1.8 MG INTO THE SKIN DAILY.   metFORMIN 500 MG tablet Commonly known as: GLUCOPHAGE Take 500 mg by mouth daily with lunch.   mirabegron ER 50 MG Tb24 tablet Commonly known as: MYRBETRIQ Take 1 tablet (50 mg total) by mouth daily.   naproxen sodium 220 MG tablet Commonly known as: ALEVE Take 440 mg by mouth daily as needed.   omeprazole 20 MG capsule Commonly known as: PRILOSEC TAKE ONE (1) CAPSULE EACH DAY   OVER THE COUNTER MEDICATION Take 1 tablet by mouth daily. Immune System Support Supplement includes :  Echinacea Zinc Vitamin C   POTASSIUM PO Take 1 tablet by mouth at bedtime.   trimethoprim 100 MG tablet Commonly known as: TRIMPEX Take 1 tablet (100 mg total) by mouth daily.   valsartan 80 MG tablet Commonly known as: DIOVAN Take 80 mg by mouth daily.       Allergies:  Allergies  Allergen Reactions  . Atorvastatin Swelling    Leg swelling Other reaction(s): Unknown Leg swelling Leg swelling  . Azithromycin Diarrhea and Nausea And Vomiting    Other reaction(s): Nausea And Vomiting  . Ciprofloxacin Other (See  Comments)    Does not remember Other reaction(s): Unknown tendonitis tendonitis  . Hydrocodone-Homatropine     REACTION:   . Hydrocodone-Homatropine     Other reaction(s): Other (See Comments) Drowsiness Other reaction(s): Other (See Comments) REACTION:  . Rosiglitazone Swelling    Other reaction(s): Unknown REACTION: SWELLING  . Rosiglitazone Maleate     REACTION: SWELLING    Family History: Family History  Problem Relation Age of Onset  . Diabetes Mother   . Hypertension Mother   . Heart attack Mother   . Heart failure Father   . Hypertension Father   . Stroke Father   .  Lung cancer Brother        hx of smoking  . Stroke Brother   . Heart attack Brother        PTCA MI x 2  . Breast cancer Paternal Aunt        paternal great aunt    Social History:   reports that she has never smoked. She has never used smokeless tobacco. She reports that she does not drink alcohol or use drugs.  ROS: UROLOGY Frequent Urination?: Yes Hard to postpone urination?: No Burning/pain with urination?: No Get up at night to urinate?: Yes Leakage of urine?: No Urine stream starts and stops?: No Trouble starting stream?: Yes Do you have to strain to urinate?: No Blood in urine?: No Urinary tract infection?: Yes Sexually transmitted disease?: No Injury to kidneys or bladder?: No Painful intercourse?: No Weak stream?: No Currently pregnant?: No Vaginal bleeding?: No Last menstrual period?: n  Gastrointestinal Nausea?: No Vomiting?: No Indigestion/heartburn?: No Diarrhea?: No Constipation?: No  Constitutional Fever: No Night sweats?: No Weight loss?: No Fatigue?: No  Skin Skin rash/lesions?: No Itching?: No  Eyes Blurred vision?: No Double vision?: No  Ears/Nose/Throat Sore throat?: No Sinus problems?: No  Hematologic/Lymphatic Swollen glands?: No Easy bruising?: No  Cardiovascular Leg swelling?: No Chest pain?: No  Respiratory Cough?: No Shortness of  breath?: No  Endocrine Excessive thirst?: No  Musculoskeletal Back pain?: No Joint pain?: No  Neurological Headaches?: No Dizziness?: No  Psychologic Depression?: Yes Anxiety?: No  Physical Exam: BP (!) 142/86   Pulse 92   Ht '5\' 5"'  (1.651 m)   Wt 184 lb (83.5 kg)   BMI 30.62 kg/m   Constitutional:  Alert and oriented, no acute distress, nontoxic appearing HEENT: [place], AT Cardiovascular: No clubbing, cyanosis, or edema Respiratory: Normal respiratory effort, no increased work of breathing Skin: No rashes, bruises or suspicious lesions Neurologic: Grossly intact, no focal deficits, moving all 4 extremities Psychiatric: Normal mood and affect  Laboratory Data: Results for orders placed or performed in visit on 01/17/20  Microscopic Examination   URINE  Result Value Ref Range   WBC, UA >30 (A) 0 - 5 /hpf   RBC None seen 0 - 2 /hpf   Epithelial Cells (non renal) 0-10 0 - 10 /hpf   Bacteria, UA Many (A) None seen/Few  Urinalysis, Complete  Result Value Ref Range   Specific Gravity, UA 1.015 1.005 - 1.030   pH, UA 5.5 5.0 - 7.5   Color, UA Yellow Yellow   Appearance Ur Cloudy (A) Clear   Leukocytes,UA 1+ (A) Negative   Protein,UA Negative Negative/Trace   Glucose, UA Negative Negative   Ketones, UA Negative Negative   RBC, UA Negative Negative   Bilirubin, UA Negative Negative   Urobilinogen, Ur 0.2 0.2 - 1.0 mg/dL   Nitrite, UA Positive (A) Negative   Microscopic Examination See below:   Bladder Scan (Post Void Residual) in office  Result Value Ref Range   Scan Result 117    Assessment & Plan:   1. Urinary frequency 79 year old female with a history of recurrent UTI, cystocele, and urinary urgency and frequency on Myrbetriq and trimethoprim prophylaxis here with a 1 day history of increased urinary urgency and frequency as well as low back aching.  UA grossly infected today.  PVR WNL.  We will send urine for culture and defer antibiotics today pending results  given that she is already on suppressive antibiotics.  I counseled her that I would contact her with  her results.  In the interim, I advised her to use Azo over-the-counter for symptomatic relief for no longer than 2 to 3 days at a time.  She expressed understanding. - Urinalysis, Complete - Bladder Scan (Post Void Residual) in office - CULTURE, URINE COMPREHENSIVE   Return if symptoms worsen or fail to improve.  Debroah Loop, PA-C  [facility] [address] [place], [place] [address] [phone]

## 2020-01-17 NOTE — Telephone Encounter (Signed)
Patient called today stating that she starting having urinary symptoms yesterday which have worsened today. Urinary frequency, dysuria and low back pain. Patient was added on to PA schedule today for further evaluation

## 2020-01-18 ENCOUNTER — Encounter (INDEPENDENT_AMBULATORY_CARE_PROVIDER_SITE_OTHER): Payer: Self-pay | Admitting: Vascular Surgery

## 2020-01-18 LAB — URINALYSIS, COMPLETE
Bilirubin, UA: NEGATIVE
Glucose, UA: NEGATIVE
Ketones, UA: NEGATIVE
Nitrite, UA: POSITIVE — AB
Protein,UA: NEGATIVE
RBC, UA: NEGATIVE
Specific Gravity, UA: 1.015 (ref 1.005–1.030)
Urobilinogen, Ur: 0.2 mg/dL (ref 0.2–1.0)
pH, UA: 5.5 (ref 5.0–7.5)

## 2020-01-18 LAB — MICROSCOPIC EXAMINATION
RBC, Urine: NONE SEEN /hpf (ref 0–2)
WBC, UA: >30 /hpf — AB (ref 0–5)

## 2020-01-20 LAB — CULTURE, URINE COMPREHENSIVE

## 2020-01-21 ENCOUNTER — Telehealth: Payer: Self-pay | Admitting: Physician Assistant

## 2020-01-21 DIAGNOSIS — N3000 Acute cystitis without hematuria: Secondary | ICD-10-CM

## 2020-01-21 MED ORDER — NITROFURANTOIN MONOHYD MACRO 100 MG PO CAPS: 100.0000 mg | ORAL_CAPSULE | Freq: Two times a day (BID) | ORAL | 0 refills | Status: AC

## 2020-01-21 NOTE — Telephone Encounter (Signed)
Called pt informed her of the information below. Pt gave verbal understanding. RX sent in.  

## 2020-01-21 NOTE — Telephone Encounter (Signed)
Please contact the patient and inform her that her urine culture has resulted with a bacteria that will not respond to her daily suppressive antibiotics.  I have sent a prescription for Macrobid 100mg  twice daily x5 days to [facility].  Please instruct her to stop her daily suppressive antibiotics and start Macrobid ASAP. She can restart the trimethoprim once she completes this course.   Follow up as needed; will consider switching her to another suppressive agent if she develops another breakthrough infection in the coming months.

## 2020-01-22 ENCOUNTER — Ambulatory Visit (INDEPENDENT_AMBULATORY_CARE_PROVIDER_SITE_OTHER): Payer: Medicare PPO | Admitting: Vascular Surgery

## 2020-01-22 ENCOUNTER — Other Ambulatory Visit: Payer: Self-pay

## 2020-01-22 ENCOUNTER — Encounter (INDEPENDENT_AMBULATORY_CARE_PROVIDER_SITE_OTHER): Payer: Self-pay | Admitting: Vascular Surgery

## 2020-01-22 VITALS — BP 157/90 | HR 93 | Wt 185.0 lb

## 2020-01-22 DIAGNOSIS — I10 Essential (primary) hypertension: Secondary | ICD-10-CM | POA: Diagnosis not present

## 2020-01-22 DIAGNOSIS — E119 Type 2 diabetes mellitus without complications: Secondary | ICD-10-CM | POA: Diagnosis not present

## 2020-01-22 DIAGNOSIS — I6529 Occlusion and stenosis of unspecified carotid artery: Secondary | ICD-10-CM | POA: Insufficient documentation

## 2020-01-22 DIAGNOSIS — E785 Hyperlipidemia, unspecified: Secondary | ICD-10-CM

## 2020-01-22 DIAGNOSIS — I6523 Occlusion and stenosis of bilateral carotid arteries: Secondary | ICD-10-CM | POA: Diagnosis not present

## 2020-01-22 NOTE — Patient Instructions (Signed)
Carotid Artery Disease  Carotid artery disease is the narrowing or blockage of one or both carotid arteries. This condition is also called carotid artery stenosis. The carotid arteries are the two main blood vessels on either side of the neck. They send blood to the brain, other parts of the head, and the neck.  This condition increases your risk for a stroke or a transient ischemic attack (TIA). A TIA is a "mini-stroke" that causes stroke-like symptoms that go away quickly. What are the causes? This condition is mainly caused by a narrowing and hardening of the carotid arteries. The carotid arteries can become narrow or clogged with a buildup of plaque. Plaque includes:  Fat.  Cholesterol.  Calcium.  Other substances. What increases the risk? The following factors may make you more likely to develop this condition:  Having certain medical conditions, such as: ? High cholesterol. ? High blood pressure. ? Diabetes. ? Obesity.  Smoking.  A family history of cardiovascular disease.  Not being active or lack of regular exercise.  Being female. Men have a higher risk of having arteries become narrow and harden earlier in life than women.  Old age. What are the signs or symptoms? This condition may not have any signs or symptoms until a stroke or TIA happens. In some cases, your doctor may be able to hear a whooshing sound. This can suggest a change in blood flow caused by plaque buildup. An eye exam can also help find signs of the condition. How is this treated? This condition may be treated with more than one treatment. Treatment options include:  Lifestyle changes, such as: ? Quitting smoking. ? Getting regular exercise, or getting exercise as told by your doctor. ? Eating a healthy diet. ? Managing stress. ? Keeping a healthy weight.  Medicines to control: ? Blood pressure. ? Cholesterol. ? Blood clotting.  Surgery. You may have: ? A surgery to remove the blockages in  the carotid arteries. ? A procedure in which a small mesh tube (stent) is used to widen the blocked carotid arteries. Follow these instructions at home: Eating and drinking Follow instructions about your diet from your doctor. It is important to follow a healthy diet.  Eat a diet that includes: ? A lot of fresh fruits and vegetables. ? Low-fat (lean) meats.  Avoid these foods: ? Foods that are high in fat. ? Foods that are high in salt (sodium). ? Foods that are fried. ? Foods that are processed. ? Foods that have few good nutrients (poor nutritional value).  Lifestyle   Keep a healthy weight.  Do exercises as told by your doctor to stay active. Each week, you should get one of the following: ? At least 150 minutes of exercise that raises your heart rate and makes you sweat (moderate-intensity exercise). ? At least 75 minutes of exercise that takes a lot of effort.  Do not use any products that contain nicotine or tobacco, such as cigarettes, e-cigarettes, and chewing tobacco. If you need help quitting, ask your doctor.  Do not drink alcohol if: ? Your doctor tells you not to drink. ? You are pregnant, may be pregnant, or are planning to become pregnant.  If you drink alcohol: ? Limit how much you use to:  0-1 drink a day for women.  0-2 drinks a day for men. ? Be aware of how much alcohol is in your drink. In the [place] one drink equals one 12 oz bottle of beer (355 mL), one 5   oz glass of wine (148 mL), or one 1 oz glass of hard liquor (44 mL).  Do not use drugs.  Manage your stress. Ask your doctor for tips on how to do this. General instructions  Take over-the-counter and prescription medicines only as told by your doctor.  Keep all follow-up visits as told by your doctor. This is important. Where to find more information  American Heart Association: www.heart.org Get help right away if:  You have any signs of a stroke. "BE FAST" is an easy way to remember the  main warning signs: ? B - Balance. Signs are dizziness, sudden trouble walking, or loss of balance. ? E - Eyes. Signs are trouble seeing or a change in how you see. ? F - Face. Signs are sudden weakness or loss of feeling of the face, or the face or eyelid drooping on one side. ? A - Arms. Signs are weakness or loss of feeling in an arm. This happens suddenly and usually on one side of the body. ? S - Speech. Signs are sudden trouble speaking, slurred speech, or trouble understanding what people say. ? T - Time. Time to call emergency services. Write down what time symptoms started.  You have other signs of a stroke, such as: ? A sudden, very bad headache with no known cause. ? Feeling like you may vomit (nausea). ? Vomiting. ? A seizure. These symptoms may be an emergency. Do not wait to see if the symptoms will go away. Get medical help right away. Call your local emergency services (911 in the [place] Do not drive yourself to the hospital. Summary  The carotid arteries are blood vessels on both sides of the neck.  If these arteries get smaller or get blocked, you are more likely to have a stroke or a mini-stroke.  This condition can be treated with lifestyle changes, medicines, surgery, or a blend of these treatments.  Get help right away if you have any signs of a stroke. "BE FAST" is an easy way to remember the main warning signs of stroke. This information is not intended to replace advice given to you by your health care provider. Make sure you discuss any questions you have with your health care provider. Document Revised: 06/25/2019 Document Reviewed: 06/25/2019 Elsevier Patient Education  2020 Elsevier Inc.  

## 2020-01-22 NOTE — Assessment & Plan Note (Signed)
she had a carotid duplex done which suggested 50 to 69% left ICA stenosis and less than 50% right ICA stenosis done at the hospital.  Her velocities were on the lower end of the range on the left.  No need for intervention at this level.  Would recommend 71-month follow-ups and her last ultrasound was about 3 months ago so we will plan on checking this in about 3 months.  Continue aspirin therapy daily.

## 2020-01-22 NOTE — Assessment & Plan Note (Signed)
blood glucose control important in reducing the progression of atherosclerotic disease. Also, involved in wound healing. On appropriate medications.  

## 2020-01-22 NOTE — Progress Notes (Signed)
Patient ID: [name], female   DOB: 1941-02-09, 79 y.o.   MRN: [id number]  Chief Complaint  Patient presents with  . New Patient (Initial Visit)    ref Edwina Barth bil carotid stenosis    HPI [name] is a 79 y.o. female.  I am asked to see the patient by Dr. Edwina Barth for evaluation of  Carotid stenosis.  The patient reports symptoms of cerebrovascular ischemia. Specifically, the patient denies amaurosis fugax, speech or swallowing difficulties, or arm or leg weakness or numbness.  About 3 months ago, she had a carotid duplex done which suggested 50 to 69% left ICA stenosis and less than 50% right ICA stenosis done at the hospital.  Her velocities were on the lower end of the range on the left.  She had had a vascular screening at the hospital several years ago which had also suggested mild to moderate disease in the left carotid artery. She does have some leg swelling.  Particularly in the left leg.  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  This is stable and she has managed it well.   Past Medical History:  Diagnosis Date  . Arthritis   . Breast cancer ([place]) 2000  . Cancer ([place])   . Diabetes mellitus   . GERD (gastroesophageal reflux disease)   . Hypertension   . Lymph edema    Rt arm  . Neuropathy   . Osteoporosis   . Personal history of chemotherapy 2000   right breast  . Personal history of radiation therapy 2000   right breast  . Thyroid disease     Past Surgical History:  Procedure Laterality Date  . ANTERIOR AND POSTERIOR REPAIR N/A 10/18/2016   Procedure: ANTERIOR (CYSTOCELE) AND POSTERIOR REPAIR (RECTOCELE);  Surgeon: Rubie Maid, MD;  Location: [facility] ORS;  Service: Gynecology;  Laterality: N/A;  . APPENDECTOMY    . BACK SURGERY  1983   discectomy  . BREAST EXCISIONAL BIOPSY Left 12/11/2010   benign.  BENIGN BREAST TISSUE WITH STROMAL  FIBROSIS, SCLEROSING ADENOSIS   . cartoid u/s  04/16/38   76-73% LICA stenosis  . COLONOSCOPY    . DOPPLER ECHOCARDIOGRAPHY  02/24/06   [facility]riv MR, EF 55-60%  . [place]   bilateral  . HEMORROIDECTOMY    . MASTECTOMY Right 2000   right, modified radical+ chemo, 2/23 nodes 4/03  . miscarriage  1970  . mumps  1965   h/o  . OTHER SURGICAL HISTORY  04/03/02   dexa, wnl  . OTHER SURGICAL HISTORY  03/12/04   dexa, wnl  . OTHER SURGICAL HISTORY  08/12/05   dexa-osteopor Hip, osteopen spine  . OTHER SURGICAL HISTORY  10/25/07   dexa- nml hip and spine  . SEPTOPLASTY  1970  . TONSILLECTOMY  1945  . VAGINAL DELIVERY     x5  . VAGINAL HYSTERECTOMY  1974   partial, fibroids  . VENTRAL HERNIA REPAIR      Family History  Problem Relation Age of Onset  . Diabetes Mother   . Hypertension Mother   . Heart attack Mother   . Heart failure Father   . Hypertension Father   . Stroke Father   . Lung cancer Brother        hx of smoking  . Stroke Brother   . Heart attack Brother  PTCA MI x 2  . Breast cancer Paternal Aunt        paternal great aunt  Brother was a longstanding patient of mine with multiple vascular issues.  Social History   Tobacco Use  . Smoking status: Never Smoker  . Smokeless tobacco: Never Used  Substance Use Topics  . Alcohol use: No    Alcohol/week: 0.0 standard drinks  . Drug use: No     Allergies  Allergen Reactions  . Atorvastatin Swelling    Leg swelling Other reaction(s): Unknown Leg swelling Leg swelling  . Azithromycin Diarrhea and Nausea And Vomiting    Other reaction(s): Nausea And Vomiting  . Ciprofloxacin Other (See Comments)    Does not remember Other reaction(s): Unknown tendonitis tendonitis  . Hydrocodone-Homatropine     REACTION:   . Hydrocodone-Homatropine     Other reaction(s): Other (See Comments) Drowsiness Other reaction(s): Other (See Comments) REACTION:  . Rosiglitazone Swelling    Other  reaction(s): Unknown REACTION: SWELLING  . Rosiglitazone Maleate     REACTION: SWELLING    Current Outpatient Medications  Medication Sig Dispense Refill  . ACCU-CHEK AVIVA PLUS test strip USE 1 STRIP TWICE A DAY TO CHECK BLOOD SUGAR 100 each 0  . acetaminophen (TYLENOL 8 HOUR) 650 MG CR tablet Take 1 tablet (650 mg total) by mouth every 8 (eight) hours as needed for pain. 30 tablet 1  . ALPRAZolam (XANAX) 0.5 MG tablet Take 0.25 mg by mouth 2 (two) times daily as needed.    Marland Kitchen aspirin 81 MG tablet Take 81 mg by mouth at bedtime.     . BD PEN NEEDLE NANO U/F 32G X 4 MM MISC USE 1 PEN NEEDLE DAILY 100 each 3  . Blood Glucose Monitoring Suppl (ACCU-CHEK AVIVA PLUS) W/DEVICE KIT Use to check blood sugar twice daily     . clobetasol cream (TEMOVATE) 3.34 % Apply 1 application topically 2 (two) times daily. For 1 week, then decrease to as needed . 30 g 0  . docusate sodium (COLACE) 100 MG capsule Take 1 capsule (100 mg total) by mouth 2 (two) times daily as needed for mild constipation. 60 capsule 2  . ezetimibe (ZETIA) 10 MG tablet TAKE ONE (1) TABLET EACH DAY 30 tablet 6  . Insulin Glargine (LANTUS SOLOSTAR) 100 UNIT/ML Solostar Pen INJECT 26 UNITS INTO THE SKIN AT BEDTIME. 15 mL 0  . Lancets (ACCU-CHEK MULTICLIX) lancets Use as instructed to check blood sugar twice daily E11.9 102 each 3  . levothyroxine (SYNTHROID, LEVOTHROID) 75 MCG tablet Take 75 mcg by mouth daily before breakfast.    . liraglutide (VICTOZA) 18 MG/3ML SOPN INJECT 1.8 MG INTO THE SKIN DAILY. 3 pen 0  . metFORMIN (GLUCOPHAGE) 500 MG tablet Take 500 mg by mouth daily with lunch.    . mirabegron ER (MYRBETRIQ) 50 MG TB24 tablet Take 1 tablet (50 mg total) by mouth daily. 30 tablet 11  . Multiple Vitamins-Minerals (CENTRUM SILVER PO) Take 1 tablet by mouth daily.      . naproxen sodium (ANAPROX) 220 MG tablet Take 440 mg by mouth daily as needed.     . nitrofurantoin, macrocrystal-monohydrate, (MACROBID) 100 MG capsule Take 1  capsule (100 mg total) by mouth every 12 (twelve) hours for 5 days. 10 capsule 0  . Omega-3 Fatty Acids (FISH OIL PO) Take 1 capsule by mouth daily.     Marland Kitchen omeprazole (PRILOSEC) 20 MG capsule TAKE ONE (1) CAPSULE EACH DAY 90 capsule 2  . OVER  THE COUNTER MEDICATION Take 1 tablet by mouth daily. Immune System Support Supplement includes :  Echinacea Zinc Vitamin C    . POTASSIUM PO Take 1 tablet by mouth at bedtime.    . sertraline (ZOLOFT) 25 MG tablet Take 25 mg by mouth daily.    Marland Kitchen trimethoprim (TRIMPEX) 100 MG tablet Take 1 tablet (100 mg total) by mouth daily. 30 tablet 11  . valsartan (DIOVAN) 80 MG tablet Take 80 mg by mouth daily.     No current facility-administered medications for this visit.      REVIEW OF SYSTEMS (Negative unless checked)  Constitutional: '[]' Weight loss  '[]' Fever  '[]' Chills Cardiac: '[]' Chest pain   '[]' Chest pressure   '[]' Palpitations   '[]' Shortness of breath when laying flat   '[]' Shortness of breath at rest   '[]' Shortness of breath with exertion. Vascular:  '[]' Pain in legs with walking   '[]' Pain in legs at rest   '[]' Pain in legs when laying flat   '[]' Claudication   '[]' Pain in feet when walking  '[]' Pain in feet at rest  '[]' Pain in feet when laying flat   '[]' History of DVT   '[]' Phlebitis   '[x]' Swelling in legs   '[]' Varicose veins   '[]' Non-healing ulcers Pulmonary:   '[]' Uses home oxygen   '[]' Productive cough   '[]' Hemoptysis   '[]' Wheeze  '[]' COPD   '[]' Asthma Neurologic:  '[]' Dizziness  '[]' Blackouts   '[]' Seizures   '[]' History of stroke   '[]' History of TIA  '[]' Aphasia   '[]' Temporary blindness   '[]' Dysphagia   '[]' Weakness or numbness in arms   '[x]' Weakness or numbness in legs Musculoskeletal:  '[]' Arthritis   '[]' Joint swelling   '[]' Joint pain   '[]' Low back pain Hematologic:  '[]' Easy bruising  '[]' Easy bleeding   '[]' Hypercoagulable state   '[]' Anemic  '[]' Hepatitis Gastrointestinal:  '[]' Blood in stool   '[]' Vomiting blood  '[]' Gastroesophageal reflux/heartburn   '[]' Abdominal pain Genitourinary:  '[]' Chronic kidney disease    '[]' Difficult urination  '[]' Frequent urination  '[]' Burning with urination   '[]' Hematuria Skin:  '[]' Rashes   '[]' Ulcers   '[]' Wounds Psychological:  '[]' History of anxiety   '[]'  History of major depression.    Physical Exam BP (!) 157/90 (BP Location: Left Arm)   Pulse 93   Wt 185 lb (83.9 kg)   BMI 30.79 kg/m  Gen:  WD/WN, NAD Head: [place]/AT, No temporalis wasting. Prominent temp pulse not noted. Ear/Nose/Throat: Hearing grossly intact, nares w/o erythema or drainage, oropharynx w/o Erythema/Exudate Eyes: Conjunctiva clear, sclera non-icteric  Neck: trachea midline.   Pulmonary:  Good air movement, clear to auscultation bilaterally.  Cardiac: RRR, no JVD Vascular:  Vessel Right Left  Radial Palpable Palpable                                   Gastrointestinal: soft, non-tender/non-distended. Musculoskeletal: M/S 5/5 throughout.  Extremities without ischemic changes.  No deformity or atrophy. Right arm 1-2+ edema, LLE 1+ edema. Neurologic: Sensation grossly intact in extremities.  Symmetrical.  Speech is fluent. Motor exam as listed above. Psychiatric: Judgment intact, Mood & affect appropriate for pt's clinical situation. Dermatologic: No rashes or ulcers noted.  No cellulitis or open wounds.   Radiology No results found.  Labs Recent Results (from the past 2160 hour(s))  Bladder Scan (Post Void Residual) in office     Status: None   Collection Time: 11/23/19  3:56 PM  Result Value Ref Range   Scan Result 135m   Urinalysis, Complete w  Microscopic (For [facility] ONLY)     Status: Abnormal   Collection Time: 11/23/19  4:40 PM  Result Value Ref Range   Color, Urine AMBER (A) YELLOW    Comment: BIOCHEMICALS MAY BE AFFECTED BY COLOR   APPearance CLEAR CLEAR   Specific Gravity, Urine 1.020 1.005 - 1.030   pH 6.0 5.0 - 8.0   Glucose, UA NEGATIVE NEGATIVE mg/dL   Hgb urine dipstick NEGATIVE NEGATIVE   Bilirubin Urine NEGATIVE NEGATIVE   Ketones, ur NEGATIVE NEGATIVE mg/dL    Protein, ur NEGATIVE NEGATIVE mg/dL   Nitrite POSITIVE (A) NEGATIVE   Leukocytes,Ua NEGATIVE NEGATIVE   Squamous Epithelial / LPF 0-5 0 - 5   WBC, UA 0-5 0 - 5 WBC/hpf   RBC / HPF 0-5 0 - 5 RBC/hpf   Bacteria, UA MANY (A) NONE SEEN    Comment: Performed at [facility], [address], [place], [place] [address]  Urinalysis, Complete     Status: Abnormal   Collection Time: 01/17/20 11:48 AM  Result Value Ref Range   Specific Gravity, UA 1.015 1.005 - 1.030   pH, UA 5.5 5.0 - 7.5   Color, UA Yellow Yellow   Appearance Ur Cloudy (A) Clear   Leukocytes,UA 1+ (A) Negative   Protein,UA Negative Negative/Trace   Glucose, UA Negative Negative   Ketones, UA Negative Negative   RBC, UA Negative Negative   Bilirubin, UA Negative Negative   Urobilinogen, Ur 0.2 0.2 - 1.0 mg/dL   Nitrite, UA Positive (A) Negative   Microscopic Examination See below:   Microscopic Examination     Status: Abnormal   Collection Time: 01/17/20 11:48 AM   URINE  Result Value Ref Range   WBC, UA >30 (A) 0 - 5 /hpf   RBC None seen 0 - 2 /hpf   Epithelial Cells (non renal) 0-10 0 - 10 /hpf   Bacteria, UA Many (A) None seen/Few  Bladder Scan (Post Void Residual) in office     Status: None   Collection Time: 01/17/20 11:59 AM  Result Value Ref Range   Scan Result 117   CULTURE, URINE COMPREHENSIVE     Status: Abnormal   Collection Time: 01/17/20  1:35 PM   Specimen: Urine   UR  Result Value Ref Range   Urine Culture, Comprehensive Final report (A)    Organism ID, Bacteria Comment (A)     Comment: Raoultella ornithinolytica Greater than 100,000 colony forming units per mL    ANTIMICROBIAL SUSCEPTIBILITY Comment     Comment:       ** S = Susceptible; I = Intermediate; R = Resistant **                    P = Positive; N = Negative             MICS are expressed in micrograms per mL    Antibiotic                 RSLT#1    RSLT#2    RSLT#3    RSLT#4 Amoxicillin/Clavulanic Acid    I Ampicillin                      R Cefazolin                      R Cefuroxime  R Ciprofloxacin                  S Gentamicin                     S Imipenem                       S Meropenem                      S Nitrofurantoin                 S Tetracycline                   R Tobramycin                     I Trimethoprim/Sulfa             R     Assessment/Plan:  Essential hypertension blood pressure control important in reducing the progression of atherosclerotic disease. On appropriate oral medications.   Type 2 diabetes mellitus without complications (HCC) blood glucose control important in reducing the progression of atherosclerotic disease. Also, involved in wound healing. On appropriate medications.   Carotid stenosis  she had a carotid duplex done which suggested 50 to 69% left ICA stenosis and less than 50% right ICA stenosis done at the hospital.  Her velocities were on the lower end of the range on the left.  No need for intervention at this level.  Would recommend 25-monthfollow-ups and her last ultrasound was about 3 months ago so we will plan on checking this in about 3 months.  Continue aspirin therapy daily.      JLeotis Pain2/08/2020, 3:56 PM   This note was created with Dragon medical transcription system.  Any errors from dictation are unintentional.

## 2020-01-22 NOTE — Assessment & Plan Note (Signed)
blood pressure control important in reducing the progression of atherosclerotic disease. On appropriate oral medications.  

## 2020-01-28 ENCOUNTER — Ambulatory Visit (INDEPENDENT_AMBULATORY_CARE_PROVIDER_SITE_OTHER): Payer: Medicare PPO

## 2020-01-28 ENCOUNTER — Telehealth: Payer: Self-pay

## 2020-01-28 ENCOUNTER — Other Ambulatory Visit: Payer: Self-pay

## 2020-01-28 ENCOUNTER — Telehealth: Payer: Self-pay | Admitting: *Deleted

## 2020-01-28 DIAGNOSIS — R300 Dysuria: Secondary | ICD-10-CM

## 2020-01-28 LAB — MICROSCOPIC EXAMINATION
Bacteria, UA: NONE SEEN
RBC, Urine: NONE SEEN /HPF (ref 0–2)

## 2020-01-28 LAB — URINALYSIS, COMPLETE
Bilirubin, UA: NEGATIVE
Glucose, UA: NEGATIVE
Ketones, UA: NEGATIVE
Leukocytes,UA: NEGATIVE
Nitrite, UA: POSITIVE — AB
Protein,UA: NEGATIVE
RBC, UA: NEGATIVE
Specific Gravity, UA: 1.02 (ref 1.005–1.030)
Urobilinogen, Ur: 1 mg/dL (ref 0.2–1.0)
pH, UA: 5.5 (ref 5.0–7.5)

## 2020-01-28 NOTE — Telephone Encounter (Signed)
Patient called triage line to state she completed Macrobid on 01/26/20, symptoms have not improved. She has not started back on her Trimethoprim as noted, stated she didn't feel this was going to help her symptoms. She has ongoing frequency, nausea and burning. Denies fevers. Offered an appointment, patient declined, states she would like to have a different RX.

## 2020-01-28 NOTE — Telephone Encounter (Signed)
Called pt per secure chat from [facility], informed that pt that her U/A was negative and we would call her with urine culture results.

## 2020-01-28 NOTE — Telephone Encounter (Signed)
Patient informed, verbalized understanding. Aware she needs to give UA to be able to treat accordingly.

## 2020-01-28 NOTE — Telephone Encounter (Signed)
Unfortunately, she has multiple medication allergies and her most recent urine culture grew quite resistant bacteria.  Please counsel her that I need to repeat a UA and culture before I can treat her due to concerns for postinfectious bladder inflammation versus persistent infection.  She does not need a clinic visit; drop off UA would be fine.

## 2020-01-28 NOTE — Addendum Note (Signed)
Addended by: Tommy Rainwater on: 01/28/2020 04:14 PM   Modules accepted: Orders

## 2020-01-28 NOTE — Progress Notes (Signed)
In and Out Catheterization  Patient is present today for a I & O catheterization due to rUTI. Patient was cleaned and prepped in a sterile fashion with betadine. A 14FR cath was inserted with the assistance of a small plastic speculum no complications were noted , 91ml of urine return was noted, urine was organge in color as patient has been taking AZO. A clean urine sample was collected for UA and Culture. Bladder was drained  And catheter was removed with out difficulty.    Preformed by: Gordy Clement, [place]   Follow up/ Additional notes: Per Sam, pt will be called with results.

## 2020-02-01 ENCOUNTER — Telehealth: Payer: Self-pay | Admitting: Physician Assistant

## 2020-02-01 LAB — CULTURE, URINE COMPREHENSIVE

## 2020-02-01 NOTE — Telephone Encounter (Signed)
Please contact the patient and inform her that her urine culture was negative.  She does not have a persistent UTI.  I suspect she is experiencing some residual bladder inflammation from her recent infection and will continue to feel better over the coming days.

## 2020-02-01 NOTE — Telephone Encounter (Signed)
Patient informed, voiced understanding. Symptoms have improved, still has ongoing fatigue. Suggested to contact PCP.

## 2020-02-11 ENCOUNTER — Other Ambulatory Visit: Payer: Self-pay

## 2020-02-11 ENCOUNTER — Encounter: Payer: Self-pay | Admitting: Urology

## 2020-02-11 ENCOUNTER — Ambulatory Visit: Payer: Medicare PPO | Admitting: Urology

## 2020-02-11 VITALS — BP 148/84 | Ht 66.0 in | Wt 185.0 lb

## 2020-02-11 DIAGNOSIS — N3281 Overactive bladder: Secondary | ICD-10-CM

## 2020-02-11 DIAGNOSIS — N3946 Mixed incontinence: Secondary | ICD-10-CM | POA: Diagnosis not present

## 2020-02-11 LAB — BLADDER SCAN AMB NON-IMAGING

## 2020-02-11 MED ORDER — MIRABEGRON ER 50 MG PO TB24: 50.0000 mg | ORAL_TABLET | Freq: Every day | ORAL | 11 refills | Status: DC

## 2020-02-11 MED ORDER — TRIMETHOPRIM 100 MG PO TABS: 100.0000 mg | ORAL_TABLET | Freq: Every day | ORAL | 11 refills | Status: DC

## 2020-02-11 NOTE — Progress Notes (Signed)
02/11/2020 3:22 PM   [name] 1941-07-15 [id number]  Referring provider: Baxter Hire, MD [place],  [place] [address]  Chief Complaint  Patient presents with  . Follow-up    HPI: Was consulted by Dr. Erlene Quan to assist the patient's possible recurrent bladder infections and frequent bladder.  She voids every 1 hour at night and sometimes 2 hours.  If she holds it longer she can have foot on the floor syndrome and wears a pad because of this.  She does not have bedwetting urge incontinence or stress incontinence.  She voids 10-11 times a day.  She describes tingling sensation and feeling poorly when she gets a bladder infection for 5 times a year that respond favorably to antibiotics.  She likely has had prolapse surgery.  She is an insulin-dependent diabetic.  She has had a hysterectomy  : She has a large grade 3 cystocele that reached the introitus associate with central defect.  Vaginal length descended from 8 or 9 cm to approximately 5 cm with Valsalva.  No rectocele.  Grade 2 hypermobility the bladder neck and negative cough test with cystocele reduced  Patient likely has recurrent bladder infections.  She has significant frequency and is most bothered by nocturia.  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 .  I reviewed the medical records and her last 2 cultures were positive. Start patient on trimethoprim suppression therapy 100 mg 30x11.    Picture drawn.  Role of urodynamics discussed.  If patient ever had surgery she would likely best benefit from a transvaginal vault suspension with cystocele repair and graft.  This is unrelated to her recurrent bladder infections and frequency.  She has significant leg edema and almost for certain has a significant nocturnal diuresis with a 3 pound weight gain in the afternoon.  She kept a voiding diary and she will void 30 ounces during the day and 30 to 40 ounces at night and is drinking a lot of soda  In  the absence of hematuria I will not perform cystoscopy at this stage but may in the future.  I will keep her on the Myrbetriq for now and see if she down regulates on trimethoprim.  Fluid modifications discussed.  Nocturnal diuresis discussed.  Percutaneous tibial nerve stimulation option in future I think fluid shifts is significant in her  We will try to reduce total fluid intake by 50%.  She will stay on cranberry and might get a probiotic.  She really thinks Myrbetriq is helping day and night and prescription and sample sent.  Trimethoprim sent.  Check residual in 9 weeks.  Watchful waiting for prolapse  Today Frequency stable Patient came in February 4 on Myrbetriq and fluid reduction was doing much better.  She had a 24-hour history of increased frequency on trimethoprim suppression therapy.  Urine culture was positive.  She went on to have a negative culture afterwards and was reassured that she may have up regulate it temporarily  Patient is on Myrbetriq and has had 1 breakthrough infection on trimethoprim.  If this occurs again soon I will switch the prophylaxis and this was discussed.  She now voids at night every 3 or 4 hours which is a great improvement.  She would then go frequently after 7 in the morning.  During day and night a trigger for urgency and sometimes urge incontinence is going from a sitting to standing position.  She does not wear a pad but wears 1  at night for confidence.  She is upset about her bladder in the sense that its compounding a decision whether or not she should have knee surgery.  I explained the breakthrough infection plan to her.  I explained to her triggers in her overactive bladder.  I do not think we should change Myrbetriq.  I think we need to have reasonable goals.  Percutaneous tibial nerve stimulation was an option discussed today.  Clinically not infected today  Trimethoprim and Myrbetriq renewed.  Encouraged to go ahead with knee surgery.   Percutaneous tibial nerve stimulation handout given.  She will call if she wants to proceed but otherwise see in a year         PMH: Past Medical History:  Diagnosis Date  . Arthritis   . Breast cancer ([place]) 2000  . Cancer ([place])   . Diabetes mellitus   . GERD (gastroesophageal reflux disease)   . Hypertension   . Lymph edema    Rt arm  . Neuropathy   . Osteoporosis   . Personal history of chemotherapy 2000   right breast  . Personal history of radiation therapy 2000   right breast  . Thyroid disease     Surgical History: Past Surgical History:  Procedure Laterality Date  . ANTERIOR AND POSTERIOR REPAIR N/A 10/18/2016   Procedure: ANTERIOR (CYSTOCELE) AND POSTERIOR REPAIR (RECTOCELE);  Surgeon: Rubie Maid, MD;  Location: [facility] ORS;  Service: Gynecology;  Laterality: N/A;  . APPENDECTOMY    . BACK SURGERY  1983   discectomy  . BREAST EXCISIONAL BIOPSY Left 12/11/2010   benign.  BENIGN BREAST TISSUE WITH STROMAL FIBROSIS, SCLEROSING ADENOSIS   . cartoid u/s  7/34/19   37-90% LICA stenosis  . COLONOSCOPY    . DOPPLER ECHOCARDIOGRAPHY  02/24/06   Triv MR, EF 55-60%  . [place]   bilateral  . HEMORROIDECTOMY    . MASTECTOMY Right 2000   right, modified radical+ chemo, 2/23 nodes 4/03  . miscarriage  1970  . mumps  1965   h/o  . OTHER SURGICAL HISTORY  04/03/02   dexa, wnl  . OTHER SURGICAL HISTORY  03/12/04   dexa, wnl  . OTHER SURGICAL HISTORY  08/12/05   dexa-osteopor Hip, osteopen spine  . OTHER SURGICAL HISTORY  10/25/07   dexa- nml hip and spine  . SEPTOPLASTY  1970  . TONSILLECTOMY  1945  . VAGINAL DELIVERY     x5  . VAGINAL HYSTERECTOMY  1974   partial, fibroids  . VENTRAL HERNIA REPAIR      Home Medications:  Allergies as of 02/11/2020      Reactions   Atorvastatin Swelling   Leg swelling Other reaction(s): Unknown Leg swelling Leg swelling   Azithromycin Diarrhea, Nausea And Vomiting   Other reaction(s): Nausea And Vomiting    Ciprofloxacin Other (See Comments)   Does not remember Other reaction(s): Unknown tendonitis tendonitis   Hydrocodone-homatropine    REACTION:    Hydrocodone-homatropine    Other reaction(s): Other (See Comments) Drowsiness Other reaction(s): Other (See Comments) REACTION:   Rosiglitazone Swelling   Other reaction(s): Unknown REACTION: SWELLING   Rosiglitazone Maleate    REACTION: SWELLING      Medication List       Accurate as of February 11, 2020  3:22 PM. If you have any questions, ask your nurse or doctor.        Accu-Chek Aviva Plus test strip Generic drug: glucose blood USE 1 STRIP TWICE  A DAY TO CHECK BLOOD SUGAR   Accu-Chek Aviva Plus w/Device Kit Use to check blood sugar twice daily   accu-chek multiclix lancets Use as instructed to check blood sugar twice daily E11.9   acetaminophen 650 MG CR tablet Commonly known as: Tylenol 8 Hour Take 1 tablet (650 mg total) by mouth every 8 (eight) hours as needed for pain.   ALPRAZolam 0.5 MG tablet Commonly known as: XANAX Take 0.25 mg by mouth 2 (two) times daily as needed.   aspirin 81 MG tablet Take 81 mg by mouth at bedtime.   BD Pen Needle Nano U/F 32G X 4 MM Misc Generic drug: Insulin Pen Needle USE 1 PEN NEEDLE DAILY   CENTRUM SILVER PO Take 1 tablet by mouth daily.   clobetasol cream 0.05 % Commonly known as: TEMOVATE Apply 1 application topically 2 (two) times daily. For 1 week, then decrease to as needed .   docusate sodium 100 MG capsule Commonly known as: COLACE Take 1 capsule (100 mg total) by mouth 2 (two) times daily as needed for mild constipation.   ezetimibe 10 MG tablet Commonly known as: ZETIA TAKE ONE (1) TABLET EACH DAY   FISH OIL PO Take 1 capsule by mouth daily.   Insulin Glargine 100 UNIT/ML Solostar Pen Commonly known as: Lantus SoloStar INJECT 26 UNITS INTO THE SKIN AT BEDTIME.   levothyroxine 75 MCG tablet Commonly known as: SYNTHROID Take 75 mcg by mouth daily before  breakfast.   liraglutide 18 MG/3ML Sopn Commonly known as: Victoza INJECT 1.8 MG INTO THE SKIN DAILY.   metFORMIN 500 MG tablet Commonly known as: GLUCOPHAGE Take 500 mg by mouth daily with lunch.   mirabegron ER 50 MG Tb24 tablet Commonly known as: MYRBETRIQ Take 1 tablet (50 mg total) by mouth daily.   naproxen sodium 220 MG tablet Commonly known as: ALEVE Take 440 mg by mouth daily as needed.   omeprazole 20 MG capsule Commonly known as: PRILOSEC TAKE ONE (1) CAPSULE EACH DAY   OVER THE COUNTER MEDICATION Take 1 tablet by mouth daily. Immune System Support Supplement includes :  Echinacea Zinc Vitamin C   POTASSIUM PO Take 1 tablet by mouth at bedtime.   sertraline 25 MG tablet Commonly known as: ZOLOFT Take 25 mg by mouth daily.   trimethoprim 100 MG tablet Commonly known as: TRIMPEX Take 1 tablet (100 mg total) by mouth daily.   valsartan 80 MG tablet Commonly known as: DIOVAN Take 80 mg by mouth daily.       Allergies:  Allergies  Allergen Reactions  . Atorvastatin Swelling    Leg swelling Other reaction(s): Unknown Leg swelling Leg swelling  . Azithromycin Diarrhea and Nausea And Vomiting    Other reaction(s): Nausea And Vomiting  . Ciprofloxacin Other (See Comments)    Does not remember Other reaction(s): Unknown tendonitis tendonitis  . Hydrocodone-Homatropine     REACTION:   . Hydrocodone-Homatropine     Other reaction(s): Other (See Comments) Drowsiness Other reaction(s): Other (See Comments) REACTION:  . Rosiglitazone Swelling    Other reaction(s): Unknown REACTION: SWELLING  . Rosiglitazone Maleate     REACTION: SWELLING    Family History: Family History  Problem Relation Age of Onset  . Diabetes Mother   . Hypertension Mother   . Heart attack Mother   . Heart failure Father   . Hypertension Father   . Stroke Father   . Lung cancer Brother        hx of smoking  .  Stroke Brother   . Heart attack Brother        PTCA MI  x 2  . Breast cancer Paternal Aunt        paternal great aunt    Social History:  reports that she has never smoked. She has never used smokeless tobacco. She reports that she does not drink alcohol or use drugs.  ROS:                                        Physical Exam: BP (!) 148/84   Ht _0  (1.676 m)   Wt 185 lb (83.9 kg)   BMI 29.86 kg/m   Constitutional:  Alert and oriented, No acute distress. HEENT: [place] AT, moist mucus membranes.  Trachea midline, no masses. Cardiovascular: No clubbing, cyanosis, or edema. Respiratory: Normal respiratory effort, no increased work of breathing. GI: Abdomen is soft, nontender, nondistended, no abdominal masses GU: No CVA tenderness.  Skin: No rashes, bruises or suspicious lesions. Lymph: No cervical or inguinal adenopathy. Neurologic: Grossly intact, no focal deficits, moving all 4 extremities. Psychiatric: Normal mood and affect.  Laboratory Data: Lab Results  Component Value Date   WBC 11.7 (H) 10/22/2019   HGB 13.4 10/22/2019   HCT 38.9 10/22/2019   MCV 86.1 10/22/2019   PLT 381 10/22/2019    Lab Results  Component Value Date   CREATININE 0.64 10/22/2019    No results found for: PSA  No results found for: TESTOSTERONE  Lab Results  Component Value Date   HGBA1C 7.1 (H) 03/01/2018    Urinalysis    Component Value Date/Time   COLORURINE AMBER (A) 11/23/2019 1640   APPEARANCEUR Hazy (A) 01/28/2020 1400   LABSPEC 1.020 11/23/2019 1640   LABSPEC 1.009 11/28/2013 2011   PHURINE 6.0 11/23/2019 1640   GLUCOSEU Negative 01/28/2020 1400   GLUCOSEU Negative 11/28/2013 2011   HGBUR NEGATIVE 11/23/2019 1640   BILIRUBINUR Negative 01/28/2020 1400   BILIRUBINUR Negative 11/28/2013 2011   KETONESUR NEGATIVE 11/23/2019 1640   PROTEINUR Negative 01/28/2020 1400   PROTEINUR NEGATIVE 11/23/2019 1640   UROBILINOGEN 0.2 02/07/2019 1042   NITRITE Positive (A) 01/28/2020 1400   NITRITE POSITIVE (A)  11/23/2019 1640   LEUKOCYTESUR Negative 01/28/2020 1400   LEUKOCYTESUR NEGATIVE 11/23/2019 1640   LEUKOCYTESUR Negative 11/28/2013 2011    Pertinent Imaging:   Assessment & Plan: See above  1. OAB (overactive bladder)  - Bladder Scan (Post Void Residual) in office   Return in about 1 year (around 02/10/2021) for MD follow up.  Reece Packer, MD  [facility] [address], [place], [place] [address] [phone]

## 2020-02-13 ENCOUNTER — Ambulatory Visit
Admission: RE | Admit: 2020-02-13 | Discharge: 2020-02-13 | Disposition: A | Discharge status: Home / Self Care | Payer: Medicare PPO | Source: Ambulatory Visit | Attending: Internal Medicine | Admitting: Internal Medicine

## 2020-02-13 DIAGNOSIS — Z1231 Encounter for screening mammogram for malignant neoplasm of breast: Secondary | ICD-10-CM | POA: Insufficient documentation

## 2020-03-27 ENCOUNTER — Other Ambulatory Visit: Payer: Self-pay | Admitting: Dermatology

## 2020-04-16 ENCOUNTER — Ambulatory Visit: Payer: Medicare PPO | Admitting: Podiatry

## 2020-04-16 ENCOUNTER — Other Ambulatory Visit: Payer: Self-pay

## 2020-04-16 ENCOUNTER — Ambulatory Visit: Payer: Medicare PPO

## 2020-04-16 DIAGNOSIS — M2041 Other hammer toe(s) (acquired), right foot: Secondary | ICD-10-CM

## 2020-04-16 DIAGNOSIS — L97511 Non-pressure chronic ulcer of other part of right foot limited to breakdown of skin: Secondary | ICD-10-CM

## 2020-04-16 DIAGNOSIS — M2042 Other hammer toe(s) (acquired), left foot: Secondary | ICD-10-CM

## 2020-04-16 MED ORDER — DOXYCYCLINE HYCLATE 100 MG PO TABS
100.0000 mg | ORAL_TABLET | Freq: Two times a day (BID) | ORAL | 0 refills | Status: DC
Start: 2020-04-16 — End: 2021-02-16

## 2020-04-16 NOTE — Progress Notes (Signed)
Subjective:  Patient ID: [name], female    DOB: [birth year],  MRN: [id number] HPI No chief complaint on file.   79 y.o. female presents with the above complaint.   ROS: Denies fever chills nausea vomiting muscle aches pains calf pain back pain chest pain shortness of breath.  Past Medical History:  Diagnosis Date  . Arthritis   . Breast cancer ([place]) 2000  . Cancer ([place])   . Diabetes mellitus   . GERD (gastroesophageal reflux disease)   . Hypertension   . Lymph edema    Rt arm  . Neuropathy   . Osteoporosis   . Personal history of chemotherapy 2000   right breast  . Personal history of radiation therapy 2000   right breast  . Thyroid disease    Past Surgical History:  Procedure Laterality Date  . ANTERIOR AND POSTERIOR REPAIR N/A 10/18/2016   Procedure: ANTERIOR (CYSTOCELE) AND POSTERIOR REPAIR (RECTOCELE);  Surgeon: Rubie Maid, MD;  Location: [facility] ORS;  Service: Gynecology;  Laterality: N/A;  . APPENDECTOMY    . BACK SURGERY  1983   discectomy  . BREAST EXCISIONAL BIOPSY Left 12/11/2010   benign.  BENIGN BREAST TISSUE WITH STROMAL FIBROSIS, SCLEROSING ADENOSIS   . cartoid u/s  4/91/79   15-05% LICA stenosis  . COLONOSCOPY    . DOPPLER ECHOCARDIOGRAPHY  02/24/06   Triv MR, EF 55-60%  . [place]   bilateral  . HEMORROIDECTOMY    . MASTECTOMY Right 2000   right, modified radical+ chemo, 2/23 nodes 4/03  . miscarriage  1970  . mumps  1965   h/o  . OTHER SURGICAL HISTORY  04/03/02   dexa, wnl  . OTHER SURGICAL HISTORY  03/12/04   dexa, wnl  . OTHER SURGICAL HISTORY  08/12/05   dexa-osteopor Hip, osteopen spine  . OTHER SURGICAL HISTORY  10/25/07   dexa- nml hip and spine  . SEPTOPLASTY  1970  . TONSILLECTOMY  1945  . VAGINAL DELIVERY     x5  . VAGINAL HYSTERECTOMY  1974   partial, fibroids  . VENTRAL HERNIA REPAIR      Current Outpatient Medications:  .  ACCU-CHEK AVIVA PLUS test strip, USE 1 STRIP TWICE A DAY TO CHECK BLOOD  SUGAR, Disp: 100 each, Rfl: 0 .  acetaminophen (TYLENOL 8 HOUR) 650 MG CR tablet, Take 1 tablet (650 mg total) by mouth every 8 (eight) hours as needed for pain., Disp: 30 tablet, Rfl: 1 .  ALPRAZolam (XANAX) 0.5 MG tablet, Take 0.25 mg by mouth 2 (two) times daily as needed., Disp: , Rfl:  .  aspirin 81 MG tablet, Take 81 mg by mouth at bedtime. , Disp: , Rfl:  .  BD PEN NEEDLE NANO U/F 32G X 4 MM MISC, USE 1 PEN NEEDLE DAILY, Disp: 100 each, Rfl: 3 .  Blood Glucose Monitoring Suppl (ACCU-CHEK AVIVA PLUS) W/DEVICE KIT, Use to check blood sugar twice daily , Disp: , Rfl:  .  carvedilol (COREG) 6.25 MG tablet, Take 6.25 mg by mouth 2 (two) times daily., Disp: , Rfl:  .  clobetasol cream (TEMOVATE) 0.05 %, APPLY ONE APPLICATION ON THE SKIN TWICE A DAY AS NEEDED. AVOID FACE, GROIN, AND UNDERARMS., Disp: 30 g, Rfl: 0 .  docusate sodium (COLACE) 100 MG capsule, Take 1 capsule (100 mg total) by mouth 2 (two) times daily as needed for mild constipation., Disp: 60 capsule, Rfl: 2 .  doxycycline (VIBRA-TABS) 100 MG tablet, Take 1 tablet (  100 mg total) by mouth 2 (two) times daily., Disp: 20 tablet, Rfl: 0 .  ezetimibe (ZETIA) 10 MG tablet, TAKE ONE (1) TABLET EACH DAY, Disp: 30 tablet, Rfl: 6 .  Insulin Glargine (LANTUS SOLOSTAR) 100 UNIT/ML Solostar Pen, INJECT 26 UNITS INTO THE SKIN AT BEDTIME., Disp: 15 mL, Rfl: 0 .  Lancets (ACCU-CHEK MULTICLIX) lancets, Use as instructed to check blood sugar twice daily E11.9, Disp: 102 each, Rfl: 3 .  levothyroxine (SYNTHROID, LEVOTHROID) 75 MCG tablet, Take 75 mcg by mouth daily before breakfast., Disp: , Rfl:  .  liraglutide (VICTOZA) 18 MG/3ML SOPN, INJECT 1.8 MG INTO THE SKIN DAILY., Disp: 3 pen, Rfl: 0 .  metFORMIN (GLUCOPHAGE) 1000 MG tablet, Take 1,000 mg by mouth daily., Disp: , Rfl:  .  metFORMIN (GLUCOPHAGE) 500 MG tablet, Take 500 mg by mouth daily with lunch., Disp: , Rfl:  .  mirabegron ER (MYRBETRIQ) 50 MG TB24 tablet, Take 1 tablet (50 mg total) by mouth  daily., Disp: 30 tablet, Rfl: 11 .  Multiple Vitamins-Minerals (CENTRUM SILVER PO), Take 1 tablet by mouth daily.  , Disp: , Rfl:  .  naproxen sodium (ANAPROX) 220 MG tablet, Take 440 mg by mouth daily as needed. , Disp: , Rfl:  .  Omega-3 Fatty Acids (FISH OIL PO), Take 1 capsule by mouth daily. , Disp: , Rfl:  .  omeprazole (PRILOSEC) 20 MG capsule, TAKE ONE (1) CAPSULE EACH DAY, Disp: 90 capsule, Rfl: 2 .  OVER THE COUNTER MEDICATION, Take 1 tablet by mouth daily. Immune System Support Supplement includes :  Echinacea Zinc Vitamin C, Disp: , Rfl:  .  POTASSIUM PO, Take 1 tablet by mouth at bedtime., Disp: , Rfl:  .  sertraline (ZOLOFT) 25 MG tablet, Take 25 mg by mouth daily., Disp: , Rfl:  .  trimethoprim (TRIMPEX) 100 MG tablet, Take 1 tablet (100 mg total) by mouth daily., Disp: 30 tablet, Rfl: 11 .  valsartan (DIOVAN) 80 MG tablet, Take 80 mg by mouth daily., Disp: , Rfl:   Allergies  Allergen Reactions  . Atorvastatin Swelling    Leg swelling Other reaction(s): Unknown Leg swelling Leg swelling  . Azithromycin Diarrhea and Nausea And Vomiting    Other reaction(s): Nausea And Vomiting  . Ciprofloxacin Other (See Comments)    Does not remember Other reaction(s): Unknown tendonitis tendonitis  . Hydrocodone-Homatropine     REACTION:   . Hydrocodone-Homatropine     Other reaction(s): Other (See Comments) Drowsiness Other reaction(s): Other (See Comments) REACTION:  . Rosiglitazone Swelling    Other reaction(s): Unknown REACTION: SWELLING  . Rosiglitazone Maleate     REACTION: SWELLING   Review of Systems Objective:  There were no vitals filed for this visit.  General: Well developed, nourished, in no acute distress, alert and oriented x3   Dermatological: Skin is warm, dry and supple bilateral. Nails x 10 are well maintained; remaining integument appears unremarkable at this time. There are no open sores, no preulcerative lesions, no rash or signs of infection present.   Distal clavus and a dystrophic nail was debrided demonstrates a pinhole size ulcerative lesion distal aspect of that second toe.  Mild erythema on the tuft of the toe extending from the superficial ulceration.  No purulence no malodor  Vascular: Dorsalis Pedis artery and Posterior Tibial artery pedal pulses are 2/4 bilateral with immedate capillary fill time. Pedal hair growth present. No varicosities and no lower extremity edema present bilateral.   Neruologic: Grossly intact via light touch  bilateral. Vibratory intact via tuning fork bilateral. Protective threshold with Semmes Wienstein monofilament intact to all pedal sites bilateral. Patellar and Achilles deep tendon reflexes 2+ bilateral. No Babinski or clonus noted bilateral.   Musculoskeletal: No gross boney pedal deformities bilateral. No pain, crepitus, or limitation noted with foot and ankle range of motion bilateral. Muscular strength 5/5 in all groups tested bilateral.  Gait: Unassisted, Nonantalgic.    Radiographs:  No radiographs were taken today  Assessment & Plan:   Assessment: Superficial ulceration distal aspect second toe right foot with mild erythema  Plan: I debrided the area today placed ointment and a light bandage and wrote a prescription for doxycycline.  Recommended soaking once daily Epson salts and warm water soaks and utilize a buttress pad to hold the toe from the touching the shoes.  I will follow-up with her in a couple weeks to make sure she is doing well.     Gailya Tauer T. [place], [place]

## 2020-04-22 ENCOUNTER — Ambulatory Visit (INDEPENDENT_AMBULATORY_CARE_PROVIDER_SITE_OTHER): Payer: Medicare PPO | Admitting: Vascular Surgery

## 2020-04-22 ENCOUNTER — Encounter (INDEPENDENT_AMBULATORY_CARE_PROVIDER_SITE_OTHER): Payer: Medicare PPO

## 2020-05-05 ENCOUNTER — Other Ambulatory Visit: Payer: Self-pay

## 2020-05-05 ENCOUNTER — Ambulatory Visit: Payer: Medicare PPO | Admitting: Podiatry

## 2020-05-05 ENCOUNTER — Encounter: Payer: Self-pay | Admitting: Podiatry

## 2020-05-05 DIAGNOSIS — L97511 Non-pressure chronic ulcer of other part of right foot limited to breakdown of skin: Secondary | ICD-10-CM | POA: Diagnosis not present

## 2020-05-05 NOTE — Progress Notes (Signed)
She presents today states that is doing a lot better she refers to the second toe on the right foot.  She says been wearing the toe crest pad and it has really helped a lot.  Objective: Vital signs are stable she alert oriented x3 the wound is completely healed to the distal aspect of the second toe right foot there is no erythema edema cellulitis drainage or odor.  Assessment: Well-healing ulceration second digit right foot.  Plan: Follow-up with her on an as-needed basis.

## 2020-06-10 ENCOUNTER — Ambulatory Visit: Payer: Medicare PPO | Admitting: Dermatology

## 2020-06-17 ENCOUNTER — Ambulatory Visit (INDEPENDENT_AMBULATORY_CARE_PROVIDER_SITE_OTHER): Payer: Medicare PPO | Admitting: Vascular Surgery

## 2020-06-17 ENCOUNTER — Encounter (INDEPENDENT_AMBULATORY_CARE_PROVIDER_SITE_OTHER): Payer: Medicare PPO

## 2020-07-08 ENCOUNTER — Encounter (INDEPENDENT_AMBULATORY_CARE_PROVIDER_SITE_OTHER): Payer: Self-pay | Admitting: Vascular Surgery

## 2020-07-08 ENCOUNTER — Ambulatory Visit (INDEPENDENT_AMBULATORY_CARE_PROVIDER_SITE_OTHER): Payer: Medicare PPO | Admitting: Vascular Surgery

## 2020-07-08 ENCOUNTER — Other Ambulatory Visit: Payer: Self-pay

## 2020-07-08 ENCOUNTER — Ambulatory Visit (INDEPENDENT_AMBULATORY_CARE_PROVIDER_SITE_OTHER): Payer: Medicare PPO

## 2020-07-08 VITALS — BP 155/82 | HR 88 | Resp 16 | Wt 179.0 lb

## 2020-07-08 DIAGNOSIS — I10 Essential (primary) hypertension: Secondary | ICD-10-CM

## 2020-07-08 DIAGNOSIS — I6523 Occlusion and stenosis of bilateral carotid arteries: Secondary | ICD-10-CM | POA: Diagnosis not present

## 2020-07-08 DIAGNOSIS — E785 Hyperlipidemia, unspecified: Secondary | ICD-10-CM | POA: Diagnosis not present

## 2020-07-08 DIAGNOSIS — E119 Type 2 diabetes mellitus without complications: Secondary | ICD-10-CM

## 2020-07-08 DIAGNOSIS — I890 Lymphedema, not elsewhere classified: Secondary | ICD-10-CM

## 2020-07-08 DIAGNOSIS — M7989 Other specified soft tissue disorders: Secondary | ICD-10-CM

## 2020-07-08 NOTE — Assessment & Plan Note (Signed)
The patient swelling is significantly improved with increasing her activity predominantly.  She is elevating a little more.  Her right leg swelling is quite mild at this point.  Her left leg swelling is better than it was before, but still noticeable.  At this point with improvement with her conservative measures I would continue these.  We will follow this as we follow her for her carotid disease going forward.

## 2020-07-08 NOTE — Progress Notes (Signed)
MRN : [id number]  [name] is a 79 y.o. (11/15/1941) female who presents with chief complaint of  Chief Complaint  Patient presents with  . Follow-up    u/s follow up  .  History of Present Illness: Patient returns in follow-up of multiple issues.  She is doing well today.  Since her last visit, she has not had any focal neurologic symptoms. Specifically, the patient denies amaurosis fugax, speech or swallowing difficulties, or arm or leg weakness or numbness.  Her carotid duplex shows better velocities than had been suggested by the previous hospital duplex.  These would fall in the 1 to 39% range bilaterally albeit on the upper end of that range on the left. She is also doing better with her leg swelling.  Her predominant change has been increasing her activity as well as elevation.  This has resulted in improvement in her swelling particularly in the right leg.  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  No open wounds or infection.  No fevers or chills. Her right arm swelling from her chronic lymphedema after breast cancer surgery remained stable with no significant change.  Current Outpatient Medications  Medication Sig Dispense Refill  . ACCU-CHEK AVIVA PLUS test strip USE 1 STRIP TWICE A DAY TO CHECK BLOOD SUGAR 100 each 0  . acetaminophen (TYLENOL 8 HOUR) 650 MG CR tablet Take 1 tablet (650 mg total) by mouth every 8 (eight) hours as needed for pain. 30 tablet 1  . ALPRAZolam (XANAX) 0.5 MG tablet Take 0.25 mg by mouth 2 (two) times daily as needed.    Marland Kitchen aspirin 81 MG tablet Take 81 mg by mouth at bedtime.     . BD PEN NEEDLE NANO U/F 32G X 4 MM MISC USE 1 PEN NEEDLE DAILY 100 each 3  . Blood Glucose Monitoring Suppl (ACCU-CHEK AVIVA PLUS) W/DEVICE KIT Use to check blood sugar twice daily     . carvedilol (COREG) 6.25 MG tablet Take 6.25 mg by mouth 2 (two) times daily.    . clobetasol cream (TEMOVATE) 0.05 % APPLY  ONE APPLICATION ON THE SKIN TWICE A DAY AS NEEDED. AVOID FACE, GROIN, AND UNDERARMS. 30 g 0  . docusate sodium (COLACE) 100 MG capsule Take 1 capsule (100 mg total) by mouth 2 (two) times daily as needed for mild constipation. 60 capsule 2  . doxycycline (VIBRA-TABS) 100 MG tablet Take 1 tablet (100 mg total) by mouth 2 (two) times daily. 20 tablet 0  . ezetimibe (ZETIA) 10 MG tablet TAKE ONE (1) TABLET EACH DAY 30 tablet 6  . Insulin Glargine (LANTUS SOLOSTAR) 100 UNIT/ML Solostar Pen INJECT 26 UNITS INTO THE SKIN AT BEDTIME. 15 mL 0  . Lancets (ACCU-CHEK MULTICLIX) lancets Use as instructed to check blood sugar twice daily E11.9 102 each 3  . levothyroxine (SYNTHROID, LEVOTHROID) 75 MCG tablet Take 75 mcg by mouth daily before breakfast.    . liraglutide (VICTOZA) 18 MG/3ML SOPN INJECT 1.8 MG INTO THE SKIN DAILY. 3 pen 0  . metFORMIN (GLUCOPHAGE) 500 MG tablet Take 500 mg by mouth daily with lunch.    . mirabegron ER (MYRBETRIQ) 50 MG TB24 tablet Take 1 tablet (50 mg total) by mouth daily. 30 tablet 11  . Multiple Vitamins-Minerals (CENTRUM SILVER PO) Take 1 tablet by mouth daily.      . naproxen sodium (ANAPROX) 220 MG tablet Take 440 mg by mouth daily as needed.     Marland Kitchen  Omega-3 Fatty Acids (FISH OIL PO) Take 1 capsule by mouth daily.     Marland Kitchen omeprazole (PRILOSEC) 20 MG capsule TAKE ONE (1) CAPSULE EACH DAY 90 capsule 2  . OVER THE COUNTER MEDICATION Take 1 tablet by mouth daily. Immune System Support Supplement includes :  Echinacea Zinc Vitamin C    . POTASSIUM PO Take 1 tablet by mouth at bedtime.    . sertraline (ZOLOFT) 25 MG tablet Take 25 mg by mouth daily.    Marland Kitchen trimethoprim (TRIMPEX) 100 MG tablet Take 1 tablet (100 mg total) by mouth daily. 30 tablet 11  . valsartan (DIOVAN) 80 MG tablet Take 80 mg by mouth daily.    . metFORMIN (GLUCOPHAGE) 1000 MG tablet Take 1,000 mg by mouth daily. (Patient not taking: Reported on 07/08/2020)     No current facility-administered medications for  this visit.    Past Medical History:  Diagnosis Date  . Arthritis   . Basal cell carcinoma 10/27/2010   Right lower medial cheek. Excised: 11/24/2010, margins free.  . Breast cancer ([place]) 2000  . Cancer ([place])   . Diabetes mellitus   . GERD (gastroesophageal reflux disease)   . Hypertension   . Lymph edema    Rt arm  . Neuropathy   . Osteoporosis   . Personal history of chemotherapy 2000   right breast  . Personal history of radiation therapy 2000   right breast  . Thyroid disease     Past Surgical History:  Procedure Laterality Date  . ANTERIOR AND POSTERIOR REPAIR N/A 10/18/2016   Procedure: ANTERIOR (CYSTOCELE) AND POSTERIOR REPAIR (RECTOCELE);  Surgeon: Rubie Maid, MD;  Location: [facility] ORS;  Service: Gynecology;  Laterality: N/A;  . APPENDECTOMY    . BACK SURGERY  1983   discectomy  . BREAST EXCISIONAL BIOPSY Left 12/11/2010   benign.  BENIGN BREAST TISSUE WITH STROMAL FIBROSIS, SCLEROSING ADENOSIS   . cartoid u/s  1/61/09   60-45% LICA stenosis  . COLONOSCOPY    . DOPPLER ECHOCARDIOGRAPHY  02/24/06   Triv MR, EF 55-60%  . [place]   bilateral  . HEMORROIDECTOMY    . MASTECTOMY Right 2000   right, modified radical+ chemo, 2/23 nodes 4/03  . miscarriage  1970  . mumps  1965   h/o  . OTHER SURGICAL HISTORY  04/03/02   dexa, wnl  . OTHER SURGICAL HISTORY  03/12/04   dexa, wnl  . OTHER SURGICAL HISTORY  08/12/05   dexa-osteopor Hip, osteopen spine  . OTHER SURGICAL HISTORY  10/25/07   dexa- nml hip and spine  . SEPTOPLASTY  1970  . TONSILLECTOMY  1945  . VAGINAL DELIVERY     x5  . VAGINAL HYSTERECTOMY  1974   partial, fibroids  . VENTRAL HERNIA REPAIR       Social History   Tobacco Use  . Smoking status: Never Smoker  . Smokeless tobacco: Never Used  Vaping Use  . Vaping Use: Never used  Substance Use Topics  . Alcohol use: No    Alcohol/week: 0.0 standard drinks  . Drug use: No       Family History  Problem Relation Age of  Onset  . Diabetes Mother   . Hypertension Mother   . Heart attack Mother   . Heart failure Father   . Hypertension Father   . Stroke Father   . Lung cancer Brother        hx of smoking  . Stroke Brother   .  Heart attack Brother        PTCA MI x 2  . Breast cancer Paternal Aunt        paternal great aunt     Allergies  Allergen Reactions  . Atorvastatin Swelling    Leg swelling Other reaction(s): Unknown Leg swelling Leg swelling  . Azithromycin Diarrhea and Nausea And Vomiting    Other reaction(s): Nausea And Vomiting  . Ciprofloxacin Other (See Comments)    Does not remember Other reaction(s): Unknown tendonitis tendonitis  . Doxycycline Nausea And Vomiting  . Hydrocodone-Homatropine     REACTION:   . Hydrocodone-Homatropine     Other reaction(s): Other (See Comments) Drowsiness Other reaction(s): Other (See Comments) REACTION:  . Rosiglitazone Swelling    Other reaction(s): Unknown REACTION: SWELLING  . Rosiglitazone Maleate     REACTION: SWELLING    REVIEW OF SYSTEMS (Negative unless checked)  Constitutional: []?Weight loss  []?Fever  []?Chills Cardiac: []?Chest pain   []?Chest pressure   []?Palpitations   []?Shortness of breath when laying flat   []?Shortness of breath at rest   []?Shortness of breath with exertion. Vascular:  [x]?Pain in legs with walking   []?Pain in legs at rest   []?Pain in legs when laying flat   []?Claudication   []?Pain in feet when walking  []?Pain in feet at rest  []?Pain in feet when laying flat   []?History of DVT   []?Phlebitis   [x]?Swelling in legs   []?Varicose veins   []?Non-healing ulcers Pulmonary:   []?Uses home oxygen   []?Productive cough   []?Hemoptysis   []?Wheeze  []?COPD   []?Asthma Neurologic:  []?Dizziness  []?Blackouts   []?Seizures   []?History of stroke   []?History of TIA  []?Aphasia   []?Temporary blindness   []?Dysphagia   []?Weakness or numbness in arms   [x]?Weakness or numbness in legs Musculoskeletal:   [x]?Arthritis   []?Joint swelling   []?Joint pain   []?Low back pain Hematologic:  []?Easy bruising  []?Easy bleeding   []?Hypercoagulable state   []?Anemic  []?Hepatitis Gastrointestinal:  []?Blood in stool   []?Vomiting blood  []?Gastroesophageal reflux/heartburn   []?Abdominal pain Genitourinary:  []?Chronic kidney disease   []?Difficult urination  []?Frequent urination  []?Burning with urination   []?Hematuria Skin:  []?Rashes   []?Ulcers   []?Wounds Psychological:  []?History of anxiety   []? History of major depression.  Physical Examination  Vitals:   07/08/20 1546  BP: (!) 155/82  Pulse: 88  Resp: 16  Weight: 179 lb (81.2 kg)   Body mass index is 28.89 kg/m. Gen:  WD/WN, NAD.  Appears younger than stated age Head: [place]/AT, No temporalis wasting. Ear/Nose/Throat: Hearing grossly intact, nares w/o erythema or drainage, trachea midline Eyes: Conjunctiva clear. Sclera non-icteric Neck: Supple.  Trachea midline Pulmonary:  Good air movement, equal and clear to auscultation bilaterally.  Cardiac: RRR, No JVD Vascular:   Right Left  DP Palpable Palpable  PT  palpable  trace palpable   Musculoskeletal: M/S 5/5 throughout.  No deformity or atrophy.  2+ right arm edema which is stable.  Trace right lower extremity edema, 1-2+ left lower extremity edema. Neurologic: CN 2-12 intact. Sensation grossly intact in extremities.  Symmetrical.  Speech is fluent. Motor exam as listed above. Psychiatric: Judgment intact, Mood & affect appropriate for pt's clinical situation. Dermatologic: No rashes or ulcers noted.  No cellulitis or open wounds.      CBC Lab Results  Component Value Date   WBC 11.7 (H)  10/22/2019   HGB 13.4 10/22/2019   HCT 38.9 10/22/2019   MCV 86.1 10/22/2019   PLT 381 10/22/2019    BMET    Component Value Date/Time   NA 130 (L) 10/22/2019 1349   NA 133 (L) 01/07/2014 1043   NA 134 12/02/2008 1107   K 4.0 10/22/2019 1349   K 3.0 (L) 01/07/2014 1043   K 4.1  12/02/2008 1107   CL 96 (L) 10/22/2019 1349   CL 96 (L) 01/07/2014 1043   CL 95 (L) 12/02/2008 1107   CO2 24 10/22/2019 1349   CO2 28 01/07/2014 1043   CO2 27 12/02/2008 1107   GLUCOSE 123 (H) 10/22/2019 1349   GLUCOSE 111 (H) 01/07/2014 1043   GLUCOSE 145 (H) 12/02/2008 1107   BUN 11 10/22/2019 1349   BUN 12 01/07/2014 1043   BUN 11 12/02/2008 1107   CREATININE 0.64 10/22/2019 1349   CREATININE 0.79 01/07/2014 1043   CREATININE 0.8 12/02/2008 1107   CALCIUM 9.6 10/22/2019 1349   CALCIUM 9.1 01/07/2014 1043   CALCIUM 9.4 12/02/2008 1107   GFRNONAA >60 10/22/2019 1349   GFRNONAA >60 01/07/2014 1043   GFRAA >60 10/22/2019 1349   GFRAA >60 01/07/2014 1043   CrCl cannot be calculated (Patient's most recent lab result is older than the maximum 21 days allowed.).  COAG Lab Results  Component Value Date   INR 0.9 03/25/2013    Radiology No results found.   Assessment/Plan Essential hypertension blood pressure control important in reducing the progression of atherosclerotic disease. On appropriate oral medications.   Type 2 diabetes mellitus without complications (HCC) blood glucose control important in reducing the progression of atherosclerotic disease. Also, involved in wound healing. On appropriate medications.  Carotid stenosis Her carotid duplex shows better velocities than had been suggested by the previous hospital duplex.  These would fall in the 1 to 39% range bilaterally albeit on the upper end of that range on the left.  At this point, no intervention will be required.  Continue aspirin therapy.  Recheck in 1 year.  Swelling of limb The patient swelling is significantly improved with increasing her activity predominantly.  She is elevating a little more.  Her right leg swelling is quite mild at this point.  Her left leg swelling is better than it was before, but still noticeable.  At this point with improvement with her conservative measures I would continue  these.  We will follow this as we follow her for her carotid disease going forward.  Lymphedema The patient has lymphedema of her right arm which is reasonably stable.  She had breast cancer over 20 years ago.  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    Leotis Pain, MD  07/08/2020 4:50 PM    This note was created with Dragon medical transcription system.  Any errors from dictation are purely unintentional

## 2020-07-08 NOTE — Assessment & Plan Note (Signed)
The patient has lymphedema of her right arm which is reasonably stable.  She had breast cancer over 20 years ago.  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

## 2020-07-08 NOTE — Assessment & Plan Note (Signed)
Her carotid duplex shows better velocities than had been suggested by the previous hospital duplex.  These would fall in the 1 to 39% range bilaterally albeit on the upper end of that range on the left.  At this point, no intervention will be required.  Continue aspirin therapy.  Recheck in 1 year.

## 2020-08-19 ENCOUNTER — Telehealth: Payer: Self-pay | Admitting: *Deleted

## 2020-08-19 MED ORDER — SULFAMETHOXAZOLE-TRIMETHOPRIM 400-80 MG PO TABS
1.0000 | ORAL_TABLET | Freq: Every day | ORAL | 11 refills | Status: DC
Start: 2020-08-19 — End: 2021-02-16

## 2020-08-19 NOTE — Telephone Encounter (Signed)
Trimethoprim is not aviable right not. Is there another medication she can take per [facility]

## 2020-08-19 NOTE — Telephone Encounter (Signed)
Single strength septra (80/400) daily 30x11

## 2020-08-19 NOTE — Telephone Encounter (Signed)
Sent medication to pharmacy.   

## 2020-09-01 ENCOUNTER — Other Ambulatory Visit: Payer: Self-pay

## 2020-09-01 ENCOUNTER — Ambulatory Visit: Payer: Medicare PPO | Admitting: Dermatology

## 2020-09-01 DIAGNOSIS — Z85828 Personal history of other malignant neoplasm of skin: Secondary | ICD-10-CM

## 2020-09-01 DIAGNOSIS — D239 Other benign neoplasm of skin, unspecified: Secondary | ICD-10-CM

## 2020-09-01 DIAGNOSIS — Z1283 Encounter for screening for malignant neoplasm of skin: Secondary | ICD-10-CM

## 2020-09-01 DIAGNOSIS — W57XXXA Bitten or stung by nonvenomous insect and other nonvenomous arthropods, initial encounter: Secondary | ICD-10-CM

## 2020-09-01 DIAGNOSIS — D229 Melanocytic nevi, unspecified: Secondary | ICD-10-CM

## 2020-09-01 DIAGNOSIS — S0086XA Insect bite (nonvenomous) of other part of head, initial encounter: Secondary | ICD-10-CM

## 2020-09-01 DIAGNOSIS — L820 Inflamed seborrheic keratosis: Secondary | ICD-10-CM

## 2020-09-01 DIAGNOSIS — D235 Other benign neoplasm of skin of trunk: Secondary | ICD-10-CM

## 2020-09-01 DIAGNOSIS — D692 Other nonthrombocytopenic purpura: Secondary | ICD-10-CM

## 2020-09-01 DIAGNOSIS — D2262 Melanocytic nevi of left upper limb, including shoulder: Secondary | ICD-10-CM

## 2020-09-01 DIAGNOSIS — D1800 Hemangioma unspecified site: Secondary | ICD-10-CM

## 2020-09-01 NOTE — Progress Notes (Signed)
   Follow-Up Visit   Subjective  [name] is a 79 y.o. female who presents for the following: Annual Exam (upper body skin exam, hx of BCC R lower medial cheek) and bites (5 days, itchy, using benadryl). Swelling around eyes has almost resolved.  She also has growth on abdomen which get irritated.  There was a flaky raised spot on her R forearm which she recently picked off.  It seems to be better now.    The following portions of the chart were reviewed this encounter and updated as appropriate:      Review of Systems:  No other skin or systemic complaints except as noted in HPI or Assessment and Plan.  Objective  Well appearing patient in no apparent distress; mood and affect are within normal limits.  All skin waist up examined.  Objective  Right lower medial cheek: Well healed scar with no evidence of recurrence.   Objective  face: Mild periocular edema  Objective  L upper abdomen x 1: Waxy pink brown macule R forearm Stuck on waxy pap with erythema L upper abdomen  Left lower shoulder  Objective  Left lower shoulder: 4.59mm brown macule  Objective  Right spinal upper back: Firm pink/brown papulenodule with dimple sign.    Assessment & Plan    Purpura - Violaceous macules and patches - Benign - Related to age, sun damage and/or use of blood thinners - Observe - Can use OTC arnica containing moisturizer such as Dermend Bruise Formula if desired - Call for worsening or other concerns  Hemangiomas - Red papules - Discussed benign nature - Observe - Call for any changes  Melanocytic Nevi - Tan-brown and/or pink-flesh-colored symmetric macules and papules - Benign appearing on exam today - Observation - Call clinic for new or changing moles - Recommend daily use of broad spectrum spf 30+ sunscreen to sun-exposed areas.     History of basal cell carcinoma (BCC) Right lower medial cheek  Clear, observe for changes   Bug bite without  infection, initial encounter face  Improved, observe  Inflamed seborrheic keratosis L upper abdomen x 1  R forearm - resolving, Observation L upper abdomen Ln2 x 1  Destruction of lesion - L upper abdomen x 1  Destruction method: cryotherapy   Informed consent: discussed and consent obtained   Lesion destroyed using liquid nitrogen: Yes   Region frozen until ice ball extended beyond lesion: Yes   Outcome: patient tolerated procedure well with no complications   Post-procedure details: wound care instructions given    Nevus Left lower shoulder  Benign-appearing.  Stable. Observation.  Call clinic for new or changing moles.  Recommend daily use of broad spectrum spf 30+ sunscreen to sun-exposed areas.    Dermatofibroma Right spinal upper back  Benign, observe.    Return in about 1 year (around 09/01/2021) for TBSE, hx of BCC.   I, Othelia Pulling, RMA, am acting as scribe for Brendolyn Patty, MD . Documentation: I have reviewed the above documentation for accuracy and completeness, and I agree with the above.  Brendolyn Patty MD

## 2020-09-01 NOTE — Patient Instructions (Signed)
Cryotherapy Aftercare  . Wash gently with soap and water everyday.   . Apply Vaseline and Band-Aid daily until healed.  

## 2021-01-13 ENCOUNTER — Other Ambulatory Visit: Payer: Self-pay | Admitting: Internal Medicine

## 2021-01-13 DIAGNOSIS — Z1231 Encounter for screening mammogram for malignant neoplasm of breast: Secondary | ICD-10-CM

## 2021-01-21 ENCOUNTER — Telehealth: Payer: Medicare PPO

## 2021-01-21 ENCOUNTER — Other Ambulatory Visit: Payer: Self-pay

## 2021-01-21 MED ORDER — TRIMETHOPRIM 100 MG PO TABS: 100.0000 mg | ORAL_TABLET | Freq: Every day | ORAL | 11 refills | Status: DC

## 2021-01-21 NOTE — Telephone Encounter (Signed)
Pt needed refill on trimethoprim. Pt has 1 yr f/u in march 2022. As per pt send rx to [facility]. rx sent to pharmacy. Pt aware.

## 2021-02-04 ENCOUNTER — Other Ambulatory Visit: Payer: Self-pay

## 2021-02-04 MED ORDER — MIRABEGRON ER 50 MG PO TB24: 50.0000 mg | ORAL_TABLET | Freq: Every day | ORAL | 11 refills | Status: DC

## 2021-02-04 NOTE — Telephone Encounter (Signed)
Medication refill sent to [facility].

## 2021-02-16 ENCOUNTER — Other Ambulatory Visit: Payer: Self-pay

## 2021-02-16 ENCOUNTER — Encounter: Payer: Self-pay | Admitting: Urology

## 2021-02-16 ENCOUNTER — Ambulatory Visit: Payer: Medicare PPO | Admitting: Urology

## 2021-02-16 VITALS — BP 156/79 | HR 85 | Ht 65.0 in | Wt 171.0 lb

## 2021-02-16 DIAGNOSIS — N3946 Mixed incontinence: Secondary | ICD-10-CM

## 2021-02-16 NOTE — Progress Notes (Signed)
02/16/2021 1:03 PM   [name] 1941/07/06 [id number]  Referring provider: Baxter Hire, MD [place],  [place] [address]  Chief Complaint  Patient presents with  . Follow-up    HPI  Other note reviewed Patient came in February 4 on Myrbetriq and fluid reduction was doing much better.  She had a 24-hour history of increased frequency on trimethoprim suppression therapy.  Urine culture was positive.  She went on to have a negative culture afterwards and was reassured that she may have up regulate it temporarily  Patient is on Myrbetriq and has had 1 breakthrough infection on trimethoprim.  If this occurs again soon I will switch the prophylaxis and this was discussed.  She now voids at night every 3 or 4 hours which is a great improvement.  She would then go frequently after 7 in the morning.  During day and night a trigger for urgency and sometimes urge incontinence is going from a sitting to standing position.  She does not wear a pad but wears 1 at night for confidence.  She is upset about her bladder in the sense that its compounding a decision whether or not she should have knee surgery.  I explained the breakthrough infection plan to her.  I explained to her triggers in her overactive bladder.  I do not think we should change Myrbetriq.  I think we need to have reasonable goals.  Percutaneous tibial nerve stimulation was an option discussed today.  Clinically not infected today  Trimethoprim and Myrbetriq renewed.  Encouraged to go ahead with knee surgery.  Percutaneous tibial nerve stimulation handout given.  She will call if she wants to proceed but otherwise see in a year.  Patient known to have prolapse.  She has significant leg edema with nocturnal diuresis and had a 3 pound weight gain in the afternoon.  Today Frequency stable. Time frequency continues due to nocturnal diuresis. Urge incontinence well-controlled on Myrbetriq. Infection free on  trimethoprim. She still wanted [place] to [place] [place] make sure she did not have an occult bladder infection causing nighttime frequency. We did this.    PMH: Past Medical History:  Diagnosis Date  . Arthritis   . Basal cell carcinoma 10/27/2010   Right lower medial cheek. Excised: 11/24/2010, margins free.  . Breast cancer ([facility]) 2000  . Cancer ([place])   . Diabetes mellitus   . GERD (gastroesophageal reflux disease)   . Hypertension   . Lymph edema    Rt arm  . Neuropathy   . Osteoporosis   . Personal history of chemotherapy 2000   right breast  . Personal history of radiation therapy 2000   right breast  . Thyroid disease     Surgical History: Past Surgical History:  Procedure Laterality Date  . ANTERIOR AND POSTERIOR REPAIR N/A 10/18/2016   Procedure: ANTERIOR (CYSTOCELE) AND POSTERIOR REPAIR (RECTOCELE);  Surgeon: Rubie Maid, MD;  Location: [facility] ORS;  Service: Gynecology;  Laterality: N/A;  . APPENDECTOMY    . BACK SURGERY  1983   discectomy  . BREAST EXCISIONAL BIOPSY Left 12/11/2010   benign.  BENIGN BREAST TISSUE WITH STROMAL FIBROSIS, SCLEROSING ADENOSIS   . cartoid u/s  08/05/22   53-61% LICA stenosis  . COLONOSCOPY    . DOPPLER ECHOCARDIOGRAPHY  02/24/06   Triv MR, EF 55-60%  . [place]   bilateral  . HEMORROIDECTOMY    . MASTECTOMY Right 2000   right, modified radical+ chemo,  2/23 nodes 4/03  . miscarriage  1970  . mumps  1965   h/o  . OTHER SURGICAL HISTORY  04/03/02   dexa, wnl  . OTHER SURGICAL HISTORY  03/12/04   dexa, wnl  . OTHER SURGICAL HISTORY  08/12/05   dexa-osteopor Hip, osteopen spine  . OTHER SURGICAL HISTORY  10/25/07   dexa- nml hip and spine  . SEPTOPLASTY  1970  . TONSILLECTOMY  1945  . VAGINAL DELIVERY     x5  . VAGINAL HYSTERECTOMY  1974   partial, fibroids  . VENTRAL HERNIA REPAIR      Home Medications:  Allergies as of 02/16/2021      Reactions   Atorvastatin Swelling   Leg swelling Other reaction(s):  Unknown Leg swelling Leg swelling   Azithromycin Diarrhea, Nausea And Vomiting   Other reaction(s): Nausea And Vomiting   Ciprofloxacin Other (See Comments)   Does not remember Other reaction(s): Unknown tendonitis tendonitis   Doxycycline Nausea And Vomiting   Hydrocodone-homatropine    REACTION:    Hydrocodone-homatropine    Other reaction(s): Other (See Comments) Drowsiness Other reaction(s): Other (See Comments) REACTION:   Rosiglitazone Swelling   Other reaction(s): Unknown REACTION: SWELLING   Rosiglitazone Maleate    REACTION: SWELLING      Medication List       Accurate as of February 16, 2021  1:03 PM. If you have any questions, ask your nurse or doctor.        Accu-Chek Aviva Plus test strip Generic drug: glucose blood USE 1 STRIP TWICE A DAY TO CHECK BLOOD SUGAR   Accu-Chek Aviva Plus w/Device Kit Use to check blood sugar twice daily   accu-chek multiclix lancets Use as instructed to check blood sugar twice daily E11.9   acetaminophen 650 MG CR tablet Commonly known as: Tylenol 8 Hour Take 1 tablet (650 mg total) by mouth every 8 (eight) hours as needed for pain.   ALPRAZolam 0.5 MG tablet Commonly known as: XANAX Take 0.25 mg by mouth 2 (two) times daily as needed.   aspirin 81 MG tablet Take 81 mg by mouth at bedtime.   BD Pen Needle Nano U/F 32G X 4 MM Misc Generic drug: Insulin Pen Needle USE 1 PEN NEEDLE DAILY   carvedilol 6.25 MG tablet Commonly known as: COREG Take 6.25 mg by mouth 2 (two) times daily.   CENTRUM SILVER PO Take 1 tablet by mouth daily.   clobetasol cream 0.05 % Commonly known as: TEMOVATE APPLY ONE APPLICATION ON THE SKIN TWICE A DAY AS NEEDED. AVOID FACE, GROIN, AND UNDERARMS.   docusate sodium 100 MG capsule Commonly known as: COLACE Take 1 capsule (100 mg total) by mouth 2 (two) times daily as needed for mild constipation.   doxycycline 100 MG tablet Commonly known as: VIBRA-TABS Take 1 tablet (100 mg total) by  mouth 2 (two) times daily.   ezetimibe 10 MG tablet Commonly known as: ZETIA TAKE ONE (1) TABLET EACH DAY   FISH OIL PO Take 1 capsule by mouth daily.   insulin glargine 100 UNIT/ML Solostar Pen Commonly known as: Lantus SoloStar INJECT 26 UNITS INTO THE SKIN AT BEDTIME.   levothyroxine 75 MCG tablet Commonly known as: SYNTHROID Take 75 mcg by mouth daily before breakfast.   liraglutide 18 MG/3ML Sopn Commonly known as: Victoza INJECT 1.8 MG INTO THE SKIN DAILY.   metFORMIN 500 MG tablet Commonly known as: GLUCOPHAGE Take 500 mg by mouth daily with lunch.   metFORMIN 1000 MG  tablet Commonly known as: GLUCOPHAGE Take 1,000 mg by mouth daily.   mirabegron ER 50 MG Tb24 tablet Commonly known as: MYRBETRIQ Take 1 tablet (50 mg total) by mouth daily.   naproxen sodium 220 MG tablet Commonly known as: ALEVE Take 440 mg by mouth daily as needed.   omeprazole 20 MG capsule Commonly known as: PRILOSEC TAKE ONE (1) CAPSULE EACH DAY   OVER THE COUNTER MEDICATION Take 1 tablet by mouth daily. Immune System Support Supplement includes :  Echinacea Zinc Vitamin C   POTASSIUM PO Take 1 tablet by mouth at bedtime.   sertraline 25 MG tablet Commonly known as: ZOLOFT Take 25 mg by mouth daily.   sulfamethoxazole-trimethoprim 400-80 MG tablet Commonly known as: BACTRIM Take 1 tablet by mouth daily.   trimethoprim 100 MG tablet Commonly known as: TRIMPEX Take 1 tablet (100 mg total) by mouth daily.   valsartan 80 MG tablet Commonly known as: DIOVAN Take 80 mg by mouth daily.       Allergies:  Allergies  Allergen Reactions  . Atorvastatin Swelling    Leg swelling Other reaction(s): Unknown Leg swelling Leg swelling  . Azithromycin Diarrhea and Nausea And Vomiting    Other reaction(s): Nausea And Vomiting  . Ciprofloxacin Other (See Comments)    Does not remember Other reaction(s): Unknown tendonitis tendonitis  . Doxycycline Nausea And Vomiting  .  Hydrocodone-Homatropine     REACTION:   . Hydrocodone-Homatropine     Other reaction(s): Other (See Comments) Drowsiness Other reaction(s): Other (See Comments) REACTION:  . Rosiglitazone Swelling    Other reaction(s): Unknown REACTION: SWELLING  . Rosiglitazone Maleate     REACTION: SWELLING    Family History: Family History  Problem Relation Age of Onset  . Diabetes Mother   . Hypertension Mother   . Heart attack Mother   . Heart failure Father   . Hypertension Father   . Stroke Father   . Lung cancer Brother        hx of smoking  . Stroke Brother   . Heart attack Brother        PTCA MI x 2  . Breast cancer Paternal Aunt        paternal great aunt    Social History:  reports that she has never smoked. She has never used smokeless tobacco. She reports that she does not drink alcohol and does not use drugs.  ROS:                                        Physical Exam: There were no vitals taken for this visit.  Constitutional:  Alert and oriented, No acute distress.   Laboratory Data: Lab Results  Component Value Date   WBC 11.7 (H) 10/22/2019   HGB 13.4 10/22/2019   HCT 38.9 10/22/2019   MCV 86.1 10/22/2019   PLT 381 10/22/2019    Lab Results  Component Value Date   CREATININE 0.64 10/22/2019    No results found for: PSA  No results found for: TESTOSTERONE  Lab Results  Component Value Date   HGBA1C 7.1 (H) 03/01/2018    Urinalysis    Component Value Date/Time   COLORURINE AMBER (A) 11/23/2019 1640   APPEARANCEUR Hazy (A) 01/28/2020 1400   LABSPEC 1.020 11/23/2019 1640   LABSPEC 1.009 11/28/2013 2011   PHURINE 6.0 11/23/2019 1640   GLUCOSEU Negative 01/28/2020 1400  GLUCOSEU Negative 11/28/2013 2011   HGBUR NEGATIVE 11/23/2019 1640   BILIRUBINUR Negative 01/28/2020 1400   BILIRUBINUR Negative 11/28/2013 2011   KETONESUR NEGATIVE 11/23/2019 1640   PROTEINUR Negative 01/28/2020 1400   PROTEINUR NEGATIVE 11/23/2019  1640   UROBILINOGEN 0.2 02/07/2019 1042   NITRITE Positive (A) 01/28/2020 1400   NITRITE POSITIVE (A) 11/23/2019 1640   LEUKOCYTESUR Negative 01/28/2020 1400   LEUKOCYTESUR NEGATIVE 11/23/2019 1640   LEUKOCYTESUR Negative 11/28/2013 2011    Pertinent Imaging:   Assessment & Plan: Both prescriptions renewed. Cath specimen sent for culture. Call if positive. I will see in 1 year  There are no diagnoses linked to this encounter.  No follow-ups on file.  Reece Packer, MD  [facility] [address] [place], [place] [address] [phone]

## 2021-02-18 ENCOUNTER — Telehealth: Payer: Self-pay

## 2021-02-18 LAB — MICROSCOPIC EXAMINATION: WBC, UA: >30 /hpf — AB (ref 0–5)

## 2021-02-18 LAB — URINALYSIS, COMPLETE
Bilirubin, UA: NEGATIVE
Glucose, UA: NEGATIVE
Ketones, UA: NEGATIVE
Nitrite, UA: NEGATIVE
Protein,UA: NEGATIVE
Specific Gravity, UA: 1.015 (ref 1.005–1.030)
Urobilinogen, Ur: 0.2 mg/dL (ref 0.2–1.0)
pH, UA: 5 (ref 5.0–7.5)

## 2021-02-18 NOTE — Telephone Encounter (Signed)
Patient called having back pain and urinary frequency. Her UA culture is still in prelim. Please advise.. She denies any fevers or chills.

## 2021-02-19 MED ORDER — NITROFURANTOIN MONOHYD MACRO 100 MG PO CAPS: 100.0000 mg | ORAL_CAPSULE | Freq: Two times a day (BID) | ORAL | 0 refills | Status: DC

## 2021-02-19 NOTE — Addendum Note (Signed)
Addended by: Alvera Novel on: 02/19/2021 01:34 PM   Modules accepted: Orders

## 2021-02-19 NOTE — Telephone Encounter (Signed)
Called patient, she would like to start macrobid. Sent medication to pharmacy. Patient verbalized understanding.

## 2021-02-19 NOTE — Telephone Encounter (Signed)
It looks like she has a low-grade bladder infection The culture is not backyet If she wants to try Macrodantin 100 mg twice a day for 7 days and we will call her if it was a wrong antibiotic that is fine; otherwise wait for culture; before she takes Macrodantin please check her allergies

## 2021-02-20 ENCOUNTER — Telehealth: Payer: Self-pay

## 2021-02-20 LAB — CULTURE, URINE COMPREHENSIVE

## 2021-02-20 NOTE — Telephone Encounter (Signed)
Pt called triage line, LM questioning if she needs to continue taking trimethoprim while taking Macrobid.   Called pt back advised her to d/c trimethoprim until Macrobid is completed, then resume trimethoprim. Pt gave verbal understanding.

## 2021-03-16 ENCOUNTER — Ambulatory Visit
Admission: RE | Admit: 2021-03-16 | Discharge: 2021-03-16 | Disposition: A | Discharge status: Home / Self Care | Payer: Medicare PPO | Source: Ambulatory Visit | Attending: Internal Medicine | Admitting: Internal Medicine

## 2021-03-16 ENCOUNTER — Other Ambulatory Visit: Payer: Self-pay

## 2021-03-16 DIAGNOSIS — Z853 Personal history of malignant neoplasm of breast: Secondary | ICD-10-CM | POA: Insufficient documentation

## 2021-03-16 DIAGNOSIS — Z1231 Encounter for screening mammogram for malignant neoplasm of breast: Secondary | ICD-10-CM | POA: Insufficient documentation

## 2021-03-16 DIAGNOSIS — Z9011 Acquired absence of right breast and nipple: Secondary | ICD-10-CM | POA: Insufficient documentation

## 2021-03-23 ENCOUNTER — Other Ambulatory Visit: Payer: Self-pay | Admitting: Internal Medicine

## 2021-03-23 DIAGNOSIS — R928 Other abnormal and inconclusive findings on diagnostic imaging of breast: Secondary | ICD-10-CM

## 2021-03-23 DIAGNOSIS — N6489 Other specified disorders of breast: Secondary | ICD-10-CM

## 2021-03-31 ENCOUNTER — Ambulatory Visit
Admission: RE | Admit: 2021-03-31 | Discharge: 2021-03-31 | Disposition: A | Discharge status: Home / Self Care | Payer: Medicare PPO | Source: Ambulatory Visit | Attending: Internal Medicine | Admitting: Internal Medicine

## 2021-03-31 ENCOUNTER — Other Ambulatory Visit: Payer: Self-pay

## 2021-03-31 DIAGNOSIS — R928 Other abnormal and inconclusive findings on diagnostic imaging of breast: Secondary | ICD-10-CM | POA: Diagnosis not present

## 2021-03-31 DIAGNOSIS — N6489 Other specified disorders of breast: Secondary | ICD-10-CM | POA: Diagnosis present

## 2021-05-26 ENCOUNTER — Other Ambulatory Visit: Payer: Self-pay

## 2021-05-26 ENCOUNTER — Other Ambulatory Visit: Payer: Self-pay | Admitting: Physician Assistant

## 2021-05-26 ENCOUNTER — Ambulatory Visit: Payer: Medicare PPO | Admitting: Physician Assistant

## 2021-05-26 ENCOUNTER — Encounter: Payer: Self-pay | Admitting: Physician Assistant

## 2021-05-26 VITALS — BP 169/77 | HR 76 | Ht 65.0 in | Wt 175.0 lb

## 2021-05-26 DIAGNOSIS — N8110 Cystocele, unspecified: Secondary | ICD-10-CM

## 2021-05-26 DIAGNOSIS — R300 Dysuria: Secondary | ICD-10-CM | POA: Diagnosis not present

## 2021-05-26 NOTE — Progress Notes (Signed)
In and Out Catheterization  Patient is present today for a I & O catheterization due to dysuria. Patient was cleaned and prepped in a sterile fashion with betadine . A 14FR cath was inserted no complications were noted , 338ml of urine return was noted, urine was orange in color. A clean urine sample was collected for urinalysis. Bladder was drained  And catheter was removed with out difficulty.    Performed by: Bradly Bienenstock, [place], [place]

## 2021-05-26 NOTE — Progress Notes (Signed)
05/26/2021 1:14 PM   [name] 1941-01-31 [id number]  CC: Chief Complaint  Patient presents with   Dysuria    HPI: [name] is a 80 y.o. female with PMH recurrent UTI on trimethoprim prophylaxis, urinary frequency on Myrbetriq 50 mg and fluid reduction, and grade 3 cystocele who has failed several pessaries who presents today for evaluation of possible UTI.   Today she reports a 2-day history of dysuria without vulvovaginal pruritus or discharge.  She recently completed 15 days of doxycycline and is currently in the middle of a 1 month course of Keflex for management of right arm cellulitis by her PCP.  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  She denies worsening bulging or pressure associated with her known cystocele, however per CMA report, her bladder was visualized protruding past the labia majora when she presented for cath UA.  This was reduced prior to catheterization and remained stable level with the vaginal introitus when I examined her.  She reports that she wishes to see Dr. Matilde Sprang moving forward for management of her cystocele.  In-office catheterized UA today positive for orange color and 1+ glucose, otherwise unable to read due to pigment interference; urine microscopy pan negative.  PMH: Past Medical History:  Diagnosis Date   Arthritis    Basal cell carcinoma 10/27/2010   Right lower medial cheek. Excised: 11/24/2010, margins free.   Breast cancer ([facility]) 2000   Cancer ([place])    Diabetes mellitus    GERD (gastroesophageal reflux disease)    Hypertension    Lymph edema    Rt arm   Neuropathy    Osteoporosis    Personal history of chemotherapy 2000   right breast   Personal history of radiation therapy 2000   right breast   Thyroid disease     Surgical History: Past Surgical History:  Procedure Laterality Date   ANTERIOR AND  POSTERIOR REPAIR N/A 10/18/2016   Procedure: ANTERIOR (CYSTOCELE) AND POSTERIOR REPAIR (RECTOCELE);  Surgeon: Rubie Maid, MD;  Location: [facility] ORS;  Service: Gynecology;  Laterality: N/A;   APPENDECTOMY     BACK SURGERY  1983   discectomy   BREAST EXCISIONAL BIOPSY Left 12/11/2010   benign.  BENIGN BREAST TISSUE WITH STROMAL FIBROSIS, SCLEROSING ADENOSIS    cartoid u/s  6/57/84   69-62% LICA stenosis   COLONOSCOPY     DOPPLER ECHOCARDIOGRAPHY  02/24/06   Triv MR, EF 55-60%   FOOT SURGERY  1992 & 1994   bilateral   HEMORROIDECTOMY     MASTECTOMY Right 2000   right, modified radical+ chemo, 2/23 nodes 4/03   miscarriage  1970   mumps  1965   h/o   OTHER SURGICAL HISTORY  04/03/02   dexa, wnl   OTHER SURGICAL HISTORY  03/12/04   dexa, wnl   OTHER SURGICAL HISTORY  08/12/05   dexa-osteopor Hip, osteopen spine   OTHER SURGICAL HISTORY  10/25/07   dexa- nml hip and spine   SEPTOPLASTY  1970   TONSILLECTOMY  1945   VAGINAL DELIVERY     x5   VAGINAL HYSTERECTOMY  1974   partial, fibroids   VENTRAL HERNIA REPAIR      Home Medications:  Allergies as of 05/26/2021       Reactions   Atorvastatin Swelling   Leg swelling Other reaction(s): Unknown Leg swelling Leg swelling   Azithromycin Diarrhea, Nausea And Vomiting   Other reaction(s): Nausea  And Vomiting   Ciprofloxacin Other (See Comments)   Does not remember Other reaction(s): Unknown tendonitis tendonitis   Doxycycline Nausea And Vomiting   Hydrocodone Bit-homatrop Mbr    REACTION:    Hydrocodone Bit-homatrop Mbr    Other reaction(s): Other (See Comments) Drowsiness Other reaction(s): Other (See Comments) REACTION:   Rosiglitazone Swelling   Other reaction(s): Unknown REACTION: SWELLING   Rosiglitazone Maleate    REACTION: SWELLING        Medication List        Accurate as of May 26, 2021  1:14 PM. If you have any questions, ask your nurse or doctor.          Accu-Chek Aviva Plus test  strip Generic drug: glucose blood USE 1 STRIP TWICE A DAY TO CHECK BLOOD SUGAR   Accu-Chek Aviva Plus w/Device Kit Use to check blood sugar twice daily   accu-chek multiclix lancets Use as instructed to check blood sugar twice daily E11.9   acetaminophen 650 MG CR tablet Commonly known as: Tylenol 8 Hour Take 1 tablet (650 mg total) by mouth every 8 (eight) hours as needed for pain.   ALPRAZolam 0.5 MG tablet Commonly known as: XANAX Take 0.25 mg by mouth 2 (two) times daily as needed.   aspirin 81 MG tablet Take 81 mg by mouth at bedtime.   BD Pen Needle Nano U/F 32G X 4 MM Misc Generic drug: Insulin Pen Needle USE 1 PEN NEEDLE DAILY   carvedilol 6.25 MG tablet Commonly known as: COREG Take 6.25 mg by mouth 2 (two) times daily.   CENTRUM SILVER PO Take 1 tablet by mouth daily.   cephALEXin 500 MG capsule Commonly known as: KEFLEX Take 500 mg by mouth 3 (three) times daily.   clobetasol cream 0.05 % Commonly known as: TEMOVATE APPLY ONE APPLICATION ON THE SKIN TWICE A DAY AS NEEDED. AVOID FACE, GROIN, AND UNDERARMS.   docusate sodium 100 MG capsule Commonly known as: COLACE Take 1 capsule (100 mg total) by mouth 2 (two) times daily as needed for mild constipation.   ezetimibe 10 MG tablet Commonly known as: ZETIA TAKE ONE (1) TABLET EACH DAY   FISH OIL PO Take 1 capsule by mouth daily.   insulin glargine 100 UNIT/ML Solostar Pen Commonly known as: Lantus SoloStar INJECT 26 UNITS INTO THE SKIN AT BEDTIME.   levothyroxine 75 MCG tablet Commonly known as: SYNTHROID Take 75 mcg by mouth daily before breakfast.   liraglutide 18 MG/3ML Sopn Commonly known as: Victoza INJECT 1.8 MG INTO THE SKIN DAILY.   meclizine 25 MG tablet Commonly known as: ANTIVERT Take 25 mg by mouth 3 (three) times daily as needed.   metFORMIN 500 MG tablet Commonly known as: GLUCOPHAGE Take 500 mg by mouth daily with lunch.   metFORMIN 1000 MG tablet Commonly known as:  GLUCOPHAGE Take 1,000 mg by mouth daily.   mirabegron ER 50 MG Tb24 tablet Commonly known as: MYRBETRIQ Take 1 tablet (50 mg total) by mouth daily.   naproxen sodium 220 MG tablet Commonly known as: ALEVE Take 440 mg by mouth daily as needed.   nitrofurantoin (macrocrystal-monohydrate) 100 MG capsule Commonly known as: MACROBID Take 1 capsule (100 mg total) by mouth every 12 (twelve) hours.   omeprazole 20 MG capsule Commonly known as: PRILOSEC TAKE ONE (1) CAPSULE EACH DAY   OVER THE COUNTER MEDICATION Take 1 tablet by mouth daily. Immune System Support Supplement includes :  Echinacea Zinc Vitamin C   POTASSIUM PO  Take 1 tablet by mouth at bedtime.   sertraline 25 MG tablet Commonly known as: ZOLOFT Take 25 mg by mouth daily.   trimethoprim 100 MG tablet Commonly known as: TRIMPEX Take 1 tablet (100 mg total) by mouth daily.   valsartan 80 MG tablet Commonly known as: DIOVAN Take 80 mg by mouth daily.        Allergies:  Allergies  Allergen Reactions   Atorvastatin Swelling    Leg swelling Other reaction(s): Unknown Leg swelling Leg swelling   Azithromycin Diarrhea and Nausea And Vomiting    Other reaction(s): Nausea And Vomiting   Ciprofloxacin Other (See Comments)    Does not remember Other reaction(s): Unknown tendonitis tendonitis   Doxycycline Nausea And Vomiting   Hydrocodone Bit-Homatrop Mbr     REACTION:    Hydrocodone Bit-Homatrop Mbr     Other reaction(s): Other (See Comments) Drowsiness Other reaction(s): Other (See Comments) REACTION:   Rosiglitazone Swelling    Other reaction(s): Unknown REACTION: SWELLING   Rosiglitazone Maleate     REACTION: SWELLING    Family History: Family History  Problem Relation Age of Onset   Diabetes Mother    Hypertension Mother    Heart attack Mother    Heart failure Father    Hypertension Father    Stroke Father    Lung cancer Brother        hx of smoking   Stroke Brother    Heart attack  Brother        PTCA MI x 2   Breast cancer Paternal Aunt        paternal great aunt    Social History:   reports that she has never smoked. She has never used smokeless tobacco. She reports that she does not drink alcohol and does not use drugs.  Physical Exam: BP (!) 169/77   Pulse 76   Ht '5\' 5"'  (1.651 m)   Wt 175 lb (79.4 kg)   BMI 29.12 kg/m   Constitutional:  Alert and oriented, no acute distress, nontoxic appearing HEENT: [place], AT Cardiovascular: No clubbing, cyanosis, or edema Respiratory: Normal respiratory effort, no increased work of breathing Skin: No rashes, bruises or suspicious lesions Neurologic: Grossly intact, no focal deficits, moving all 4 extremities Psychiatric: Normal mood and affect  Laboratory Data: Results for orders placed or performed in visit on 05/26/21  Microscopic Examination   Urine  Result Value Ref Range   WBC, UA 0-5 0 - 5 /hpf   RBC 0-2 0 - 2 /hpf   Epithelial Cells (non renal) 0-10 0 - 10 /hpf   Bacteria, UA None seen None seen/Few  Urinalysis, Complete  Result Value Ref Range   Specific Gravity, UA 1.020 1.005 - 1.030   pH, UA 6.0 5.0 - 7.5   Color, UA Orange Yellow   Appearance Ur Hazy (A) Clear   Protein,UA CANCELED    Glucose, UA 1+ (A) Negative   Ketones, UA Negative Negative   RBC, UA Negative Negative   Bilirubin, UA Negative Negative   Microscopic Examination See below:    Assessment & Plan:   1. Dysuria UA bland today in the setting of significant recent antibiotic use.  This may be a false positive, however her most recent culture was susceptible to doxycycline and so I doubt urinary infection in the setting of recent doxycycline use.  Will send for culture and treat as indicated. - Urinalysis, Complete - CULTURE, URINE COMPREHENSIVE  2. Bladder prolapse, female, acquired Likely worsened past  her baseline of grade 3 cystocele per CMA report, however I was unable to corroborate this on physical exam today.  I do think  this may be contributing to her dysuria if she is kinking of the urethra, causing difficulty emptying.  I recommended that she follow-up with Dr. Matilde Sprang for further evaluation and she is in agreement with this plan.  Return in about 2 weeks (around 06/09/2021) for Cystocele f/u with Dr. Matilde Sprang.  Debroah Loop, PA-C  [facility] [address], [place], [place] [address] [phone]

## 2021-05-27 LAB — URINALYSIS, COMPLETE
Bilirubin, UA: NEGATIVE
Ketones, UA: NEGATIVE
RBC, UA: NEGATIVE
Specific Gravity, UA: 1.02 (ref 1.005–1.030)
pH, UA: 6 (ref 5.0–7.5)

## 2021-05-27 LAB — MICROSCOPIC EXAMINATION: Bacteria, UA: NONE SEEN

## 2021-05-29 LAB — CULTURE, URINE COMPREHENSIVE

## 2021-06-02 ENCOUNTER — Telehealth: Payer: Self-pay | Admitting: *Deleted

## 2021-06-02 NOTE — Telephone Encounter (Addendum)
Attempted several times to contact patient-unable to contact or leave message  ----- Message from Nori Riis, PA-C sent at 05/29/2021  3:28 PM EDT ----- Please let Mrs. [name] know that her urine culture was negative for infection and to keep her scheduled appointment with Dr. Matilde Sprang.

## 2021-06-22 ENCOUNTER — Encounter: Payer: Self-pay | Admitting: Urology

## 2021-06-22 ENCOUNTER — Ambulatory Visit: Payer: Medicare PPO | Admitting: Urology

## 2021-06-22 ENCOUNTER — Other Ambulatory Visit: Payer: Self-pay

## 2021-06-22 VITALS — BP 180/87 | HR 78 | Ht 65.0 in | Wt 174.0 lb

## 2021-06-22 DIAGNOSIS — N3020 Other chronic cystitis without hematuria: Secondary | ICD-10-CM | POA: Diagnosis not present

## 2021-06-22 MED ORDER — MIRABEGRON ER 50 MG PO TB24: 50.0000 mg | ORAL_TABLET | Freq: Every day | ORAL | 11 refills | Status: DC

## 2021-06-22 MED ORDER — TRIMETHOPRIM 100 MG PO TABS: 100.0000 mg | ORAL_TABLET | Freq: Every day | ORAL | 11 refills | Status: DC

## 2021-06-22 NOTE — Progress Notes (Signed)
06/22/2021 2:18 PM   [name] 02/05/1941 [id number]  Referring provider: Baxter Hire, MD [place],  [place] [address]  Chief Complaint  Patient presents with   Cystocele    HPI: Other note reviewed Patient came in February 4 on Myrbetriq and fluid reduction was doing much better.  She had a 24-hour history of increased frequency on trimethoprim suppression therapy.  Urine culture was positive.  She went on to have a negative culture afterwards and was reassured that she may have up regulate it temporarily   Patient is on Myrbetriq and has had 1 breakthrough infection on trimethoprim.  If this occurs again soon I will switch the prophylaxis and this was discussed.  She now voids at night every 3 or 4 hours which is a great improvement.  She would then go frequently after 7 in the morning.  During day and night a trigger for urgency and sometimes urge incontinence is going from a sitting to standing position.  She does not wear a pad but wears 1 at night for confidence.  She is upset about her bladder in the sense that its compounding a decision whether or not she should have knee surgery.   I explained the breakthrough infection plan to her.  I explained to her triggers in her overactive bladder.  I do not think we should change Myrbetriq.  I think we need to have reasonable goals.  Percutaneous tibial nerve stimulation was an option discussed today.   Trimethoprim and Myrbetriq renewed.  Encouraged to go ahead with knee surgery.  Percutaneous tibial nerve stimulation handout given.  She will call if she wants to proceed but otherwise see in a year.  Patient known to have prolapse.  She has significant leg edema with nocturnal diuresis and had a 3 pound weight gain in the afternoon.   Night time frequency continues due to nocturnal diuresis. Urge incontinence well-controlled on Myrbetriq. Infection free on trimethoprim. She still wanted [place] to culturemake sure  she did not have an occult bladder infection causing nighttime frequency. We did this.    both prescriptions renewed. Cath specimen sent for culture. Call if positive. I will see in 1 year  Today When she was here in March/22 her culture was positive.  She came in in June and saw a nurse practitioner for possible bladder infection.  She just taken doxycycline and Keflex for cellulitis.  Her urine culture on that visit was negative in June.  Pelvic examination from year ago demonstrated large grade 3 cystocele that reached introitus associate with central defect.  Vaginal length decreased from 8 or 9 cm to 5 cm.  Negative cough test with cystocele reduced.  Picture had been drawn.  I thought if she had surgery she likely best benefit from a transvaginal vault suspension with cystocele repair and graft.  She is unrelated recurrent Neri tract infections and overactive bladder.  She has significant leg edema with a nocturnal diuresis with weight gain noted.  Clinically infection free on trimethoprim.  Urge incontinence completely gone during the day but still has foot on the floor syndrome at night.  Decreased nocturia and is pleased.  She can feel when she wipes her bladder and feels a bit of pressure but otherwise is doing well.  She says she is very anxious about her blood pressure and scared to have surgery.  She has significant neuropathy and edema is noted.  She tends to be very anxious.  She has  a lot of medical comorbidities.  Picture drawn and I went through surgery and urodynamics again.   PMH: Past Medical History:  Diagnosis Date   Arthritis    Basal cell carcinoma 10/27/2010   Right lower medial cheek. Excised: 11/24/2010, margins free.   Breast cancer ([place]) 2000   Cancer ([place])    Diabetes mellitus    GERD (gastroesophageal reflux disease)    Hypertension    Lymph edema    Rt arm   Neuropathy    Osteoporosis    Personal history of chemotherapy 2000   right breast   Personal  history of radiation therapy 2000   right breast   Thyroid disease     Surgical History: Past Surgical History:  Procedure Laterality Date   ANTERIOR AND POSTERIOR REPAIR N/A 10/18/2016   Procedure: ANTERIOR (CYSTOCELE) AND POSTERIOR REPAIR (RECTOCELE);  Surgeon: Rubie Maid, MD;  Location: [facility] ORS;  Service: Gynecology;  Laterality: N/A;   APPENDECTOMY     BACK SURGERY  1983   discectomy   BREAST EXCISIONAL BIOPSY Left 12/11/2010   benign.  BENIGN BREAST TISSUE WITH STROMAL FIBROSIS, SCLEROSING ADENOSIS    cartoid u/s  0/80/22   33-61% LICA stenosis   COLONOSCOPY     DOPPLER ECHOCARDIOGRAPHY  02/24/06   Triv MR, EF 55-60%   FOOT SURGERY  1992 & 1994   bilateral   HEMORROIDECTOMY     MASTECTOMY Right 2000   right, modified radical+ chemo, 2/23 nodes 4/03   miscarriage  1970   mumps  1965   h/o   OTHER SURGICAL HISTORY  04/03/02   dexa, wnl   OTHER SURGICAL HISTORY  03/12/04   dexa, wnl   OTHER SURGICAL HISTORY  08/12/05   dexa-osteopor Hip, osteopen spine   OTHER SURGICAL HISTORY  10/25/07   dexa- nml hip and spine   SEPTOPLASTY  1970   TONSILLECTOMY  1945   VAGINAL DELIVERY     x5   VAGINAL HYSTERECTOMY  1974   partial, fibroids   VENTRAL HERNIA REPAIR      Home Medications:  Allergies as of 06/22/2021       Reactions   Atorvastatin Swelling   Leg swelling Other reaction(s): Unknown Leg swelling Leg swelling   Azithromycin Diarrhea, Nausea And Vomiting   Other reaction(s): Nausea And Vomiting   Ciprofloxacin Other (See Comments)   Does not remember Other reaction(s): Unknown tendonitis tendonitis   Doxycycline Nausea And Vomiting   Hydrocodone Bit-homatrop Mbr    REACTION:    Hydrocodone Bit-homatrop Mbr    Other reaction(s): Other (See Comments) Drowsiness Other reaction(s): Other (See Comments) REACTION:   Rosiglitazone Swelling   Other reaction(s): Unknown REACTION: SWELLING   Rosiglitazone Maleate    REACTION: SWELLING        Medication  List        Accurate as of June 22, 2021  2:18 PM. If you have any questions, ask your nurse or doctor.          Accu-Chek Aviva Plus test strip Generic drug: glucose blood USE 1 STRIP TWICE A DAY TO CHECK BLOOD SUGAR   Accu-Chek Aviva Plus w/Device Kit Use to check blood sugar twice daily   accu-chek multiclix lancets Use as instructed to check blood sugar twice daily E11.9   acetaminophen 650 MG CR tablet Commonly known as: Tylenol 8 Hour Take 1 tablet (650 mg total) by mouth every 8 (eight) hours as needed for pain.   ALPRAZolam 0.5 MG tablet Commonly known  as: XANAX Take 0.25 mg by mouth 2 (two) times daily as needed.   aspirin 81 MG tablet Take 81 mg by mouth at bedtime.   BD Pen Needle Nano U/F 32G X 4 MM Misc Generic drug: Insulin Pen Needle USE 1 PEN NEEDLE DAILY   carvedilol 6.25 MG tablet Commonly known as: COREG Take 6.25 mg by mouth 2 (two) times daily.   CENTRUM SILVER PO Take 1 tablet by mouth daily.   cephALEXin 500 MG capsule Commonly known as: KEFLEX Take 500 mg by mouth 3 (three) times daily.   clobetasol cream 0.05 % Commonly known as: TEMOVATE APPLY ONE APPLICATION ON THE SKIN TWICE A DAY AS NEEDED. AVOID FACE, GROIN, AND UNDERARMS.   docusate sodium 100 MG capsule Commonly known as: COLACE Take 1 capsule (100 mg total) by mouth 2 (two) times daily as needed for mild constipation.   ezetimibe 10 MG tablet Commonly known as: ZETIA TAKE ONE (1) TABLET EACH DAY   FISH OIL PO Take 1 capsule by mouth daily.   insulin glargine 100 UNIT/ML Solostar Pen Commonly known as: Lantus SoloStar INJECT 26 UNITS INTO THE SKIN AT BEDTIME.   levothyroxine 75 MCG tablet Commonly known as: SYNTHROID Take 75 mcg by mouth daily before breakfast.   liraglutide 18 MG/3ML Sopn Commonly known as: Victoza INJECT 1.8 MG INTO THE SKIN DAILY.   meclizine 25 MG tablet Commonly known as: ANTIVERT Take 25 mg by mouth 3 (three) times daily as needed.    metFORMIN 500 MG tablet Commonly known as: GLUCOPHAGE Take 500 mg by mouth daily with lunch.   metFORMIN 1000 MG tablet Commonly known as: GLUCOPHAGE Take 1,000 mg by mouth daily.   mirabegron ER 50 MG Tb24 tablet Commonly known as: MYRBETRIQ Take 1 tablet (50 mg total) by mouth daily.   naproxen sodium 220 MG tablet Commonly known as: ALEVE Take 440 mg by mouth daily as needed.   nitrofurantoin (macrocrystal-monohydrate) 100 MG capsule Commonly known as: MACROBID Take 1 capsule (100 mg total) by mouth every 12 (twelve) hours.   omeprazole 20 MG capsule Commonly known as: PRILOSEC TAKE ONE (1) CAPSULE EACH DAY   OVER THE COUNTER MEDICATION Take 1 tablet by mouth daily. Immune System Support Supplement includes :  Echinacea Zinc Vitamin C   POTASSIUM PO Take 1 tablet by mouth at bedtime.   sertraline 25 MG tablet Commonly known as: ZOLOFT Take 25 mg by mouth daily.   trimethoprim 100 MG tablet Commonly known as: TRIMPEX Take 1 tablet (100 mg total) by mouth daily.   valsartan 80 MG tablet Commonly known as: DIOVAN Take 80 mg by mouth daily.        Allergies:  Allergies  Allergen Reactions   Atorvastatin Swelling    Leg swelling Other reaction(s): Unknown Leg swelling Leg swelling   Azithromycin Diarrhea and Nausea And Vomiting    Other reaction(s): Nausea And Vomiting   Ciprofloxacin Other (See Comments)    Does not remember Other reaction(s): Unknown tendonitis tendonitis   Doxycycline Nausea And Vomiting   Hydrocodone Bit-Homatrop Mbr     REACTION:    Hydrocodone Bit-Homatrop Mbr     Other reaction(s): Other (See Comments) Drowsiness Other reaction(s): Other (See Comments) REACTION:   Rosiglitazone Swelling    Other reaction(s): Unknown REACTION: SWELLING   Rosiglitazone Maleate     REACTION: SWELLING    Family History: Family History  Problem Relation Age of Onset   Diabetes Mother    Hypertension Mother  Heart attack Mother     Heart failure Father    Hypertension Father    Stroke Father    Lung cancer Brother        hx of smoking   Stroke Brother    Heart attack Brother        PTCA MI x 2   Breast cancer Paternal Aunt        paternal great aunt    Social History:  reports that she has never smoked. She has never used smokeless tobacco. She reports that she does not drink alcohol and does not use drugs.  ROS:                                        Physical Exam: There were no vitals taken for this visit.  Constitutional:  Alert and oriented, No acute distress. HEENT: [place] AT, moist mucus membranes.  Trachea midline, no masses.   Laboratory Data: Lab Results  Component Value Date   WBC 11.7 (H) 10/22/2019   HGB 13.4 10/22/2019   HCT 38.9 10/22/2019   MCV 86.1 10/22/2019   PLT 381 10/22/2019    Lab Results  Component Value Date   CREATININE 0.64 10/22/2019    No results found for: PSA  No results found for: TESTOSTERONE  Lab Results  Component Value Date   HGBA1C 7.1 (H) 03/01/2018    Urinalysis    Component Value Date/Time   COLORURINE AMBER (A) 11/23/2019 1640   APPEARANCEUR Hazy (A) 05/26/2021 1319   LABSPEC 1.020 11/23/2019 1640   LABSPEC 1.009 11/28/2013 2011   PHURINE 6.0 11/23/2019 1640   GLUCOSEU 1+ (A) 05/26/2021 1319   GLUCOSEU Negative 11/28/2013 2011   HGBUR NEGATIVE 11/23/2019 1640   BILIRUBINUR Negative 05/26/2021 1319   BILIRUBINUR Negative 11/28/2013 2011   KETONESUR NEGATIVE 11/23/2019 1640   PROTEINUR CANCELED 05/26/2021 1319   PROTEINUR NEGATIVE 11/23/2019 1640   UROBILINOGEN 0.2 02/07/2019 1042   NITRITE Negative 02/16/2021 1335   NITRITE POSITIVE (A) 11/23/2019 1640   LEUKOCYTESUR 2+ (A) 02/16/2021 1335   LEUKOCYTESUR NEGATIVE 11/23/2019 1640   LEUKOCYTESUR Negative 11/28/2013 2011    Pertinent Imaging:   Assessment & Plan: I recommend watchful waiting.  I do not think surgery is in her best interest.  Is not can change the  natural history of her overactive bladder and infections.  She agreed.  Both prescriptions are renewed and I will see in 1 year  There are no diagnoses linked to this encounter.  No follow-ups on file.  Reece Packer, MD  [facility] [address] [place], [place] [address] [phone]

## 2021-06-25 ENCOUNTER — Ambulatory Visit: Payer: Medicare PPO | Admitting: Podiatry

## 2021-06-25 ENCOUNTER — Other Ambulatory Visit: Payer: Self-pay

## 2021-06-25 ENCOUNTER — Encounter: Payer: Self-pay | Admitting: Podiatry

## 2021-06-25 DIAGNOSIS — E119 Type 2 diabetes mellitus without complications: Secondary | ICD-10-CM | POA: Diagnosis not present

## 2021-06-25 DIAGNOSIS — M79675 Pain in left toe(s): Secondary | ICD-10-CM | POA: Diagnosis not present

## 2021-06-25 DIAGNOSIS — M79674 Pain in right toe(s): Secondary | ICD-10-CM | POA: Insufficient documentation

## 2021-06-25 DIAGNOSIS — I890 Lymphedema, not elsewhere classified: Secondary | ICD-10-CM | POA: Diagnosis not present

## 2021-06-25 DIAGNOSIS — B351 Tinea unguium: Secondary | ICD-10-CM | POA: Diagnosis not present

## 2021-06-25 NOTE — Progress Notes (Addendum)
This patient returns to my office for at risk foot care.  This patient requires this care by a professional since this patient will be at risk due to having lymphedema and diabetes and neuropathy. This patient is unable to cut nails herself since the patient cannot reach her  nails.These nails are painful walking and wearing shoes.  This patient presents for at risk foot care today.  General Appearance  Alert, conversant and in no acute stress.  Vascular  Dorsalis pedis  are palpable  bilaterally.  Posterior tibial pulses are absent due to severe ankle swelling. Capillary return is within normal limits  bilaterally. Temperature is within normal limits  bilaterally.  Absent digital hair  B/L.  Swelling feet  B/L  Neurologic  Senn-Weinstein monofilament wire test within normal limits  bilaterally. Muscle power within normal limits bilaterally.  Nails Thick disfigured discolored nails with subungual debris  from hallux to fifth toes bilaterally. No evidence of bacterial infection or drainage bilaterally.  Orthopedic  No limitations of motion  feet .  No crepitus or effusions noted.  No bony pathology Hammer toes  B/L.  Skin  normotropic skin with no porokeratosis noted bilaterally.  No signs of infections or ulcers noted.     Onychomycosis  Pain in right toes  Pain in left toes  Consent was obtained for treatment procedures.   Mechanical debridement of nails 1-5  bilaterally performed with a nail nipper.  Filed with dremel without incident.    Return office visit   3 months                   Told patient to return for periodic foot care and evaluation due to potential at risk complications.   Gardiner Barefoot DPM

## 2021-06-30 ENCOUNTER — Ambulatory Visit (INDEPENDENT_AMBULATORY_CARE_PROVIDER_SITE_OTHER): Payer: Medicare PPO | Admitting: Vascular Surgery

## 2021-06-30 ENCOUNTER — Other Ambulatory Visit: Payer: Self-pay

## 2021-06-30 ENCOUNTER — Encounter (INDEPENDENT_AMBULATORY_CARE_PROVIDER_SITE_OTHER): Payer: Self-pay | Admitting: Vascular Surgery

## 2021-06-30 ENCOUNTER — Ambulatory Visit (INDEPENDENT_AMBULATORY_CARE_PROVIDER_SITE_OTHER): Payer: Medicare PPO

## 2021-06-30 VITALS — BP 159/78 | HR 75 | Resp 16 | Wt 175.6 lb

## 2021-06-30 DIAGNOSIS — I10 Essential (primary) hypertension: Secondary | ICD-10-CM

## 2021-06-30 DIAGNOSIS — E119 Type 2 diabetes mellitus without complications: Secondary | ICD-10-CM | POA: Diagnosis not present

## 2021-06-30 DIAGNOSIS — I6523 Occlusion and stenosis of bilateral carotid arteries: Secondary | ICD-10-CM | POA: Diagnosis not present

## 2021-06-30 DIAGNOSIS — I890 Lymphedema, not elsewhere classified: Secondary | ICD-10-CM

## 2021-06-30 NOTE — Assessment & Plan Note (Signed)
Her carotid duplex today reveals stable disease in the 1 to 39% range on the right and slight progression of her disease on the left now just into the 40 to 59% range.  No role for intervention at this level.  Continue current medical regimen.  Recheck in 1 year.

## 2021-06-30 NOTE — Assessment & Plan Note (Signed)
Symptoms are reasonably stable and well-controlled with conservative measures at this point.

## 2021-06-30 NOTE — Progress Notes (Signed)
MRN : [id number]  [name] is a 80 y.o. (09-20-1941) female who presents with chief complaint of  Chief Complaint  Patient presents with   Follow-up    Ultrasound follow up  .  History of Present Illness: Patient returns today in follow up of her carotid disease.  She is doing well today without any specific complaints.  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  She also has lymphedema in her right arm which is still relatively prominent but reasonably stable.  We discussed options for her neuropathy, but she is not interested in starting any medications which would be sedating, and this would rule out the primary neuropathy medicines. Her carotid duplex today reveals stable disease in the 1 to 39% range on the right and slight progression of her disease on the left now just into the 40 to 59% range.  Current Outpatient Medications  Medication Sig Dispense Refill   ACCU-CHEK AVIVA PLUS test strip USE 1 STRIP TWICE A DAY TO CHECK BLOOD SUGAR 100 each 0   acetaminophen (TYLENOL 8 HOUR) 650 MG CR tablet Take 1 tablet (650 mg total) by mouth every 8 (eight) hours as needed for pain. 30 tablet 1   ALPRAZolam (XANAX) 0.5 MG tablet Take 0.25 mg by mouth 2 (two) times daily as needed.     aspirin 81 MG tablet Take 81 mg by mouth at bedtime.     BD PEN NEEDLE NANO U/F 32G X 4 MM MISC USE 1 PEN NEEDLE DAILY 100 each 3   Blood Glucose Monitoring Suppl (ACCU-CHEK AVIVA PLUS) W/DEVICE KIT Use to check blood sugar twice daily     carvedilol (COREG) 6.25 MG tablet Take 6.25 mg by mouth 2 (two) times daily.     clobetasol cream (TEMOVATE) 1.93 % APPLY ONE APPLICATION ON THE SKIN TWICE A DAY AS NEEDED. AVOID FACE, GROIN, AND UNDERARMS. 30 g 0   docusate sodium (COLACE) 100 MG capsule Take 1 capsule (100 mg total) by mouth 2 (two) times daily as needed for mild constipation. 60 capsule 2   ezetimibe (ZETIA) 10 MG tablet TAKE ONE (1) TABLET  EACH DAY 30 tablet 6   Insulin Glargine (LANTUS SOLOSTAR) 100 UNIT/ML Solostar Pen INJECT 26 UNITS INTO THE SKIN AT BEDTIME. 15 mL 0   Lancets (ACCU-CHEK MULTICLIX) lancets Use as instructed to check blood sugar twice daily E11.9 102 each 3   levothyroxine (SYNTHROID, LEVOTHROID) 75 MCG tablet Take 75 mcg by mouth daily before breakfast.     meclizine (ANTIVERT) 25 MG tablet Take 25 mg by mouth 3 (three) times daily as needed.     mirabegron ER (MYRBETRIQ) 50 MG TB24 tablet Take 1 tablet (50 mg total) by mouth daily. 30 tablet 11   Multiple Vitamins-Minerals (CENTRUM SILVER PO) Take 1 tablet by mouth daily.     naproxen sodium (ANAPROX) 220 MG tablet Take 440 mg by mouth daily as needed.      Omega-3 Fatty Acids (FISH OIL PO) Take 1 capsule by mouth daily.      omeprazole (PRILOSEC) 20 MG capsule TAKE ONE (1) CAPSULE EACH DAY 90 capsule 2   OVER THE COUNTER MEDICATION Take 1 tablet by mouth daily. Immune System Support Supplement includes :  Echinacea Zinc Vitamin C     POTASSIUM PO Take 1 tablet by mouth at bedtime.     sertraline (ZOLOFT) 25 MG tablet Take 25 mg by mouth daily.  trimethoprim (TRIMPEX) 100 MG tablet Take 1 tablet (100 mg total) by mouth daily. 30 tablet 11   valsartan (DIOVAN) 80 MG tablet Take 80 mg by mouth daily.     liraglutide (VICTOZA) 18 MG/3ML SOPN INJECT 1.8 MG INTO THE SKIN DAILY. (Patient not taking: Reported on 06/30/2021) 3 pen 0   metFORMIN (GLUCOPHAGE) 1000 MG tablet Take 1,000 mg by mouth daily. (Patient not taking: Reported on 06/30/2021)     metFORMIN (GLUCOPHAGE) 500 MG tablet Take 500 mg by mouth daily with lunch. (Patient not taking: Reported on 06/30/2021)     No current facility-administered medications for this visit.    Past Medical History:  Diagnosis Date   Arthritis    Basal cell carcinoma 10/27/2010   Right lower medial cheek. Excised: 11/24/2010, margins free.   Breast cancer ([place]) 2000   Cancer ([place])    Diabetes mellitus    GERD  (gastroesophageal reflux disease)    Hypertension    Lymph edema    Rt arm   Neuropathy    Osteoporosis    Personal history of chemotherapy 2000   right breast   Personal history of radiation therapy 2000   right breast   Thyroid disease     Past Surgical History:  Procedure Laterality Date   ANTERIOR AND POSTERIOR REPAIR N/A 10/18/2016   Procedure: ANTERIOR (CYSTOCELE) AND POSTERIOR REPAIR (RECTOCELE);  Surgeon: Rubie Maid, MD;  Location: [facility] ORS;  Service: Gynecology;  Laterality: N/A;   APPENDECTOMY     BACK SURGERY  1983   discectomy   BREAST EXCISIONAL BIOPSY Left 12/11/2010   benign.  BENIGN BREAST TISSUE WITH STROMAL FIBROSIS, SCLEROSING ADENOSIS    cartoid u/s  1/82/99   37-16% LICA stenosis   COLONOSCOPY     DOPPLER ECHOCARDIOGRAPHY  02/24/06   Triv MR, EF 55-60%   FOOT SURGERY  1992 & 1994   bilateral   HEMORROIDECTOMY     MASTECTOMY Right 2000   right, modified radical+ chemo, 2/23 nodes 4/03   miscarriage  1970   mumps  1965   h/o   OTHER SURGICAL HISTORY  04/03/02   dexa, wnl   OTHER SURGICAL HISTORY  03/12/04   dexa, wnl   OTHER SURGICAL HISTORY  08/12/05   dexa-osteopor Hip, osteopen spine   OTHER SURGICAL HISTORY  10/25/07   dexa- nml hip and spine   SEPTOPLASTY  [place]     x5   VAGINAL HYSTERECTOMY  1974   partial, fibroids   VENTRAL HERNIA REPAIR       Social History   Tobacco Use   Smoking status: Never   Smokeless tobacco: Never  Vaping Use   Vaping Use: Never used  Substance Use Topics   Alcohol use: No    Alcohol/week: 0.0 standard drinks   Drug use: No       Family History  Problem Relation Age of Onset   Diabetes Mother    Hypertension Mother    Heart attack Mother    Heart failure Father    Hypertension Father    Stroke Father    Lung cancer Brother        hx of smoking   Stroke Brother    Heart attack Brother        PTCA MI x 2   Breast cancer Paternal Aunt        paternal  great aunt     Allergies  Allergen Reactions  Atorvastatin Swelling    Leg swelling Other reaction(s): Unknown Leg swelling Leg swelling   Azithromycin Diarrhea and Nausea And Vomiting    Other reaction(s): Nausea And Vomiting   Ciprofloxacin Other (See Comments)    Does not remember Other reaction(s): Unknown tendonitis tendonitis   Doxycycline Nausea And Vomiting   Hydrocodone Bit-Homatrop Mbr     REACTION:    Hydrocodone Bit-Homatrop Mbr     Other reaction(s): Other (See Comments) Drowsiness Other reaction(s): Other (See Comments) REACTION:   Rosiglitazone Swelling    Other reaction(s): Unknown REACTION: SWELLING   Rosiglitazone Maleate     REACTION: SWELLING    REVIEW OF SYSTEMS (Negative unless checked)   Constitutional: '[]' Weight loss  '[]' Fever  '[]' Chills Cardiac: '[]' Chest pain   '[]' Chest pressure   '[]' Palpitations   '[]' Shortness of breath when laying flat   '[]' Shortness of breath at rest   '[]' Shortness of breath with exertion. Vascular:  '[x]' Pain in legs with walking   '[]' Pain in legs at rest   '[]' Pain in legs when laying flat   '[]' Claudication   '[]' Pain in feet when walking  '[]' Pain in feet at rest  '[]' Pain in feet when laying flat   '[]' History of DVT   '[]' Phlebitis   '[x]' Swelling in legs   '[]' Varicose veins   '[]' Non-healing ulcers Pulmonary:   '[]' Uses home oxygen   '[]' Productive cough   '[]' Hemoptysis   '[]' Wheeze  '[]' COPD   '[]' Asthma Neurologic:  '[]' Dizziness  '[]' Blackouts   '[]' Seizures   '[]' History of stroke   '[]' History of TIA  '[]' Aphasia   '[]' Temporary blindness   '[]' Dysphagia   '[]' Weakness or numbness in arms   '[x]' Weakness or numbness in legs Musculoskeletal:  '[x]' Arthritis   '[]' Joint swelling   '[]' Joint pain   '[]' Low back pain Hematologic:  '[]' Easy bruising  '[]' Easy bleeding   '[]' Hypercoagulable state   '[]' Anemic  '[]' Hepatitis Gastrointestinal:  '[]' Blood in stool   '[]' Vomiting blood  '[]' Gastroesophageal reflux/heartburn   '[]' Abdominal pain Genitourinary:  '[]' Chronic kidney disease   '[]' Difficult urination   '[]' Frequent urination  '[]' Burning with urination   '[]' Hematuria Skin:  '[]' Rashes   '[]' Ulcers   '[]' Wounds Psychological:  '[]' History of anxiety   '[]'  History of major depression.  Physical Examination  BP (!) 159/78 (BP Location: Left Arm)   Pulse 75   Resp 16   Wt 175 lb 9.6 oz (79.7 kg)   BMI 29.22 kg/m  Gen:  WD/WN, NAD.  Appears younger than stated age Head: [place]/AT, No temporalis wasting. Ear/Nose/Throat: Hearing grossly intact, nares w/o erythema or drainage Eyes: Conjunctiva clear. Sclera non-icteric Neck: Supple.  Trachea midline Pulmonary:  Good air movement, no use of accessory muscles.  Cardiac: RRR, no JVD Vascular:  Vessel Right Left  Radial Palpable Palpable       Musculoskeletal: M/S 5/5 throughout.  No deformity or atrophy.  2+ right upper extremity edema, mild bilateral lower extremity edema. Neurologic: Sensation grossly intact in extremities.  Symmetrical.  Speech is fluent.  Psychiatric: Judgment intact, Mood & affect appropriate for pt's clinical situation. Dermatologic: No rashes or ulcers noted.  No cellulitis or open wounds.      Labs Recent Results (from the past 2160 hour(s))  Urinalysis, Complete     Status: Abnormal   Collection Time: 05/26/21  1:19 PM  Result Value Ref Range   Specific Gravity, UA 1.020 1.005 - 1.030   pH, UA 6.0 5.0 - 7.5   Color, UA Orange Yellow    Comment: Unable to read or assay due to color interference.  Appearance Ur Hazy (A) Clear   Protein,UA CANCELED     Comment: Test not performed  Result canceled by the ancillary.    Glucose, UA 1+ (A) Negative   Ketones, UA Negative Negative   RBC, UA Negative Negative   Bilirubin, UA Negative Negative   Microscopic Examination See below:   Microscopic Examination     Status: None   Collection Time: 05/26/21  1:19 PM   Urine  Result Value Ref Range   WBC, UA 0-5 0 - 5 /hpf   RBC 0-2 0 - 2 /hpf   Epithelial Cells (non renal) 0-10 0 - 10 /hpf   Bacteria, UA None seen None  seen/Few  CULTURE, URINE COMPREHENSIVE     Status: None   Collection Time: 05/26/21  2:14 PM   UR  Result Value Ref Range   Urine Culture, Comprehensive Final report    Organism ID, Bacteria Comment     Comment: No growth in 36 - 48 hours.    Radiology No results found.  Assessment/Plan Essential hypertension blood pressure control important in reducing the progression of atherosclerotic disease. On appropriate oral medications.     Type 2 diabetes mellitus without complications (HCC) blood glucose control important in reducing the progression of atherosclerotic disease. Also, involved in wound healing. On appropriate medications.  Lymphedema Symptoms are reasonably stable and well-controlled with conservative measures at this point.  Carotid stenosis Her carotid duplex today reveals stable disease in the 1 to 39% range on the right and slight progression of her disease on the left now just into the 40 to 59% range.  No role for intervention at this level.  Continue current medical regimen.  Recheck in 1 year.    Leotis Pain, MD  06/30/2021 3:48 PM    This note was created with Dragon medical transcription system.  Any errors from dictation are purely unintentional

## 2021-09-07 ENCOUNTER — Encounter: Payer: Medicare PPO | Admitting: Dermatology

## 2021-10-01 ENCOUNTER — Ambulatory Visit: Payer: Medicare PPO | Admitting: Podiatry

## 2021-10-05 ENCOUNTER — Telehealth: Payer: Self-pay | Admitting: Podiatry

## 2021-10-05 NOTE — Telephone Encounter (Signed)
Pt left message stating she got this appliance from Dr Milinda Pointer and it is wearing out and needs to know if we would sell her one or 2 of them.  I returned call and told pt they were called buttress pads is what Dr Milinda Pointer put in note and yes she could purchase them. She just needs to go to the office and they can sell them to her. I did tell her they are 10.00 each.

## 2022-02-15 ENCOUNTER — Ambulatory Visit: Payer: Self-pay | Admitting: Urology

## 2022-06-04 ENCOUNTER — Other Ambulatory Visit: Payer: Self-pay | Admitting: Internal Medicine

## 2022-06-04 ENCOUNTER — Other Ambulatory Visit: Payer: Self-pay | Admitting: Family Medicine

## 2022-06-04 DIAGNOSIS — Z1231 Encounter for screening mammogram for malignant neoplasm of breast: Secondary | ICD-10-CM

## 2022-06-21 ENCOUNTER — Ambulatory Visit: Payer: Self-pay | Admitting: Urology

## 2022-06-29 ENCOUNTER — Ambulatory Visit (INDEPENDENT_AMBULATORY_CARE_PROVIDER_SITE_OTHER): Payer: Medicare PPO | Admitting: Vascular Surgery

## 2022-06-29 ENCOUNTER — Encounter (INDEPENDENT_AMBULATORY_CARE_PROVIDER_SITE_OTHER): Payer: Medicare PPO

## 2022-07-23 ENCOUNTER — Other Ambulatory Visit: Payer: Self-pay | Admitting: *Deleted

## 2022-07-23 MED ORDER — TRIMETHOPRIM 100 MG PO TABS: 100.0000 mg | ORAL_TABLET | Freq: Every day | ORAL | 0 refills | Status: DC

## 2022-07-26 ENCOUNTER — Other Ambulatory Visit: Payer: Self-pay | Admitting: *Deleted

## 2022-07-26 MED ORDER — MIRABEGRON ER 50 MG PO TB24: 50.0000 mg | ORAL_TABLET | Freq: Every day | ORAL | 0 refills | Status: DC

## 2022-08-23 ENCOUNTER — Other Ambulatory Visit: Payer: Self-pay | Admitting: Urology

## 2022-08-23 ENCOUNTER — Ambulatory Visit: Payer: Medicare PPO | Admitting: Urology

## 2022-08-23 DIAGNOSIS — N3020 Other chronic cystitis without hematuria: Secondary | ICD-10-CM

## 2022-08-23 DIAGNOSIS — N3946 Mixed incontinence: Secondary | ICD-10-CM

## 2022-08-23 NOTE — Progress Notes (Unsigned)
08/24/2022 7:26 PM   [name] [birth year] [id number]  Referring provider: Baxter Hire, MD [place],  [place] [address]  Urological history: 1. rUTI's -Contributing factors of age, vaginal atrophy, diabetes, incontinence -Documented positive urine cultures over the last year  -None  2. Mixed incontinence -Contributing factors of age, hysterectomy, vaginal births, diabetes, arthritis, hypertension, vaginal atrophy, pelvic prolapse and lymphedema -PVR *** -Myrbetriq 50 mg daily  3. Nocturia -Risk factors for nocturia: hypertension, diabetes and arthritis -PVR ***  4. Renal cyst -contrast CT (2019) - lower pole of the right kidney there is a 2.1 cm simple cyst.   No chief complaint on file.   HPI: [name] is a 81 y.o. female who presents today for yearly follow up.  PVR ***   PMH: Past Medical History:  Diagnosis Date   Arthritis    Basal cell carcinoma 10/27/2010   Right lower medial cheek. Excised: 11/24/2010, margins free.   Breast cancer ([place]) 2000   Cancer ([place])    Diabetes mellitus    GERD (gastroesophageal reflux disease)    Hypertension    Lymph edema    Rt arm   Neuropathy    Osteoporosis    Personal history of chemotherapy 2000   right breast   Personal history of radiation therapy 2000   right breast   Thyroid disease     Surgical History: Past Surgical History:  Procedure Laterality Date   ANTERIOR AND POSTERIOR REPAIR N/A 10/18/2016   Procedure: ANTERIOR (CYSTOCELE) AND POSTERIOR REPAIR (RECTOCELE);  Surgeon: Rubie Maid, MD;  Location: [facility] ORS;  Service: Gynecology;  Laterality: N/A;   APPENDECTOMY     BACK SURGERY  1983   discectomy   BREAST EXCISIONAL BIOPSY Left 12/11/2010   benign.  BENIGN BREAST TISSUE WITH STROMAL FIBROSIS, SCLEROSING ADENOSIS    cartoid u/s  01/25/07   65-78% LICA stenosis   COLONOSCOPY     DOPPLER ECHOCARDIOGRAPHY  02/24/06   Triv MR, EF 55-60%   FOOT SURGERY  1992 &  1994   bilateral   HEMORROIDECTOMY     MASTECTOMY Right 2000   right, modified radical+ chemo, 2/23 nodes 4/03   miscarriage  1970   mumps  1965   h/o   OTHER SURGICAL HISTORY  04/03/02   dexa, wnl   OTHER SURGICAL HISTORY  03/12/04   dexa, wnl   OTHER SURGICAL HISTORY  08/12/05   dexa-osteopor Hip, osteopen spine   OTHER SURGICAL HISTORY  10/25/07   dexa- nml hip and spine   SEPTOPLASTY  1970   TONSILLECTOMY  1945   VAGINAL DELIVERY     x5   VAGINAL HYSTERECTOMY  1974   partial, fibroids   VENTRAL HERNIA REPAIR      Home Medications:  Allergies as of 08/24/2022       Reactions   Atorvastatin Swelling   Leg swelling Other reaction(s): Unknown Leg swelling Leg swelling   Azithromycin Diarrhea, Nausea And Vomiting   Other reaction(s): Nausea And Vomiting   Ciprofloxacin Other (See Comments)   Does not remember Other reaction(s): Unknown tendonitis tendonitis   Doxycycline Nausea And Vomiting   Hydrocodone Bit-homatrop Mbr    REACTION:    Hydrocodone Bit-homatrop Mbr    Other reaction(s): Other (See Comments) Drowsiness Other reaction(s): Other (See Comments) REACTION:   Rosiglitazone Swelling   Other reaction(s): Unknown REACTION: SWELLING   Rosiglitazone Maleate    REACTION: SWELLING        Medication  List        Accurate as of August 23, 2022  7:26 PM. If you have any questions, ask your nurse or doctor.          Accu-Chek Aviva Plus test strip Generic drug: glucose blood USE 1 STRIP TWICE A DAY TO CHECK BLOOD SUGAR   Accu-Chek Aviva Plus w/Device Kit Use to check blood sugar twice daily   accu-chek multiclix lancets Use as instructed to check blood sugar twice daily E11.9   acetaminophen 650 MG CR tablet Commonly known as: Tylenol 8 Hour Take 1 tablet (650 mg total) by mouth every 8 (eight) hours as needed for pain.   ALPRAZolam 0.5 MG tablet Commonly known as: XANAX Take 0.25 mg by mouth 2 (two) times daily as needed.   aspirin  81 MG tablet Take 81 mg by mouth at bedtime.   BD Pen Needle Nano U/F 32G X 4 MM Misc Generic drug: Insulin Pen Needle USE 1 PEN NEEDLE DAILY   carvedilol 6.25 MG tablet Commonly known as: COREG Take 6.25 mg by mouth 2 (two) times daily.   CENTRUM SILVER PO Take 1 tablet by mouth daily.   clobetasol cream 0.05 % Commonly known as: TEMOVATE APPLY ONE APPLICATION ON THE SKIN TWICE A DAY AS NEEDED. AVOID FACE, GROIN, AND UNDERARMS.   docusate sodium 100 MG capsule Commonly known as: COLACE Take 1 capsule (100 mg total) by mouth 2 (two) times daily as needed for mild constipation.   ezetimibe 10 MG tablet Commonly known as: ZETIA TAKE ONE (1) TABLET EACH DAY   FISH OIL PO Take 1 capsule by mouth daily.   insulin glargine 100 UNIT/ML Solostar Pen Commonly known as: Lantus SoloStar INJECT 26 UNITS INTO THE SKIN AT BEDTIME.   levothyroxine 75 MCG tablet Commonly known as: SYNTHROID Take 75 mcg by mouth daily before breakfast.   liraglutide 18 MG/3ML Sopn Commonly known as: Victoza INJECT 1.8 MG INTO THE SKIN DAILY.   meclizine 25 MG tablet Commonly known as: ANTIVERT Take 25 mg by mouth 3 (three) times daily as needed.   metFORMIN 500 MG tablet Commonly known as: GLUCOPHAGE Take 500 mg by mouth daily with lunch.   metFORMIN 1000 MG tablet Commonly known as: GLUCOPHAGE Take 1,000 mg by mouth daily.   mirabegron ER 50 MG Tb24 tablet Commonly known as: MYRBETRIQ Take 1 tablet (50 mg total) by mouth daily.   naproxen sodium 220 MG tablet Commonly known as: ALEVE Take 440 mg by mouth daily as needed.   omeprazole 20 MG capsule Commonly known as: PRILOSEC TAKE ONE (1) CAPSULE EACH DAY   OVER THE COUNTER MEDICATION Take 1 tablet by mouth daily. Immune System Support Supplement includes :  Echinacea Zinc Vitamin C   POTASSIUM PO Take 1 tablet by mouth at bedtime.   sertraline 25 MG tablet Commonly known as: ZOLOFT Take 25 mg by mouth daily.    trimethoprim 100 MG tablet Commonly known as: TRIMPEX Take 1 tablet (100 mg total) by mouth daily.   valsartan 80 MG tablet Commonly known as: DIOVAN Take 80 mg by mouth daily.        Allergies:  Allergies  Allergen Reactions   Atorvastatin Swelling    Leg swelling Other reaction(s): Unknown Leg swelling Leg swelling   Azithromycin Diarrhea and Nausea And Vomiting    Other reaction(s): Nausea And Vomiting   Ciprofloxacin Other (See Comments)    Does not remember Other reaction(s): Unknown tendonitis tendonitis   Doxycycline Nausea  And Vomiting   Hydrocodone Bit-Homatrop Mbr     REACTION:    Hydrocodone Bit-Homatrop Mbr     Other reaction(s): Other (See Comments) Drowsiness Other reaction(s): Other (See Comments) REACTION:   Rosiglitazone Swelling    Other reaction(s): Unknown REACTION: SWELLING   Rosiglitazone Maleate     REACTION: SWELLING    Family History: Family History  Problem Relation Age of Onset   Diabetes Mother    Hypertension Mother    Heart attack Mother    Heart failure Father    Hypertension Father    Stroke Father    Lung cancer Brother        hx of smoking   Stroke Brother    Heart attack Brother        PTCA MI x 2   Breast cancer Paternal Aunt        paternal great aunt    Social History:  reports that she has never smoked. She has never used smokeless tobacco. She reports that she does not drink alcohol and does not use drugs.  ROS: Pertinent ROS in HPI  Physical Exam: There were no vitals taken for this visit.  Constitutional:  Well nourished. Alert and oriented, No acute distress. HEENT: [place] AT, moist mucus membranes.  Trachea midline, no masses. Cardiovascular: No clubbing, cyanosis, or edema. Respiratory: Normal respiratory effort, no increased work of breathing. GI: Abdomen is soft, non tender, non distended, no abdominal masses. Liver and spleen not palpable.  No hernias appreciated.  Stool sample for occult testing is  not indicated.   GU: No CVA tenderness.  No bladder fullness or masses.  *** external genitalia, *** pubic hair distribution, no lesions.  Normal urethral meatus, no lesions, no prolapse, no discharge.   No urethral masses, tenderness and/or tenderness. No bladder fullness, tenderness or masses. *** vagina mucosa, *** estrogen effect, no discharge, no lesions, *** pelvic support, *** cystocele and *** rectocele noted.  No cervical motion tenderness.  Uterus is freely mobile and non-fixed.  No adnexal/parametria masses or tenderness noted.  Anus and perineum are without rashes or lesions.   ***  Skin: No rashes, bruises or suspicious lesions. Lymph: No cervical or inguinal adenopathy. Neurologic: Grossly intact, no focal deficits, moving all 4 extremities. Psychiatric: Normal mood and affect.    Laboratory Data: Glucose 70 - 110 mg/dL 76   Sodium 136 - 145 mmol/L 135 Low    Potassium 3.6 - 5.1 mmol/L 4.5   Chloride 97 - 109 mmol/L 100   Carbon Dioxide (CO2) 22.0 - 32.0 mmol/L 28.4   Urea Nitrogen (BUN) 7 - 25 mg/dL 13   Creatinine 0.6 - 1.1 mg/dL 0.8   Glomerular Filtration Rate (eGFR), MDRD Estimate >60 mL/min/1.73sq m 69   Calcium 8.7 - 10.3 mg/dL 9.5   AST  8 - 39 U/L 19   ALT  5 - 38 U/L 11   Alk Phos (alkaline Phosphatase) 34 - 104 U/L 53   Albumin 3.5 - 4.8 g/dL 4.2   Bilirubin, Total 0.3 - 1.2 mg/dL 0.8   Protein, Total 6.1 - 7.9 g/dL 6.8   A/G Ratio 1.0 - 5.0 gm/dL 1.6   Resulting Agency  [facility] - [facility]   Specimen Collected: 02/08/22 10:31   Performed by: [place]: 02/08/22 12:26  Received From: [facility]  Result Received: 06/04/22 10:10   WBC (White Blood Cell Count) 4.1 - 10.2 10^3/uL 7.5   RBC (Red Blood  Cell Count) 4.04 - 5.48 10^6/uL 4.26   Hemoglobin 12.0 - 15.0 gm/dL 13.1   Hematocrit 35.0 - 47.0 % 39.3   MCV (Mean Corpuscular Volume) 80.0 - 100.0 fl 92.3   MCH (Mean Corpuscular Hemoglobin) 27.0 - 31.2 pg  30.8   MCHC (Mean Corpuscular Hemoglobin Concentration) 32.0 - 36.0 gm/dL 33.3   Platelet Count 150 - 450 10^3/uL 334   RDW-CV (Red Cell Distribution Width) 11.6 - 14.8 % 12.4   MPV (Mean Platelet Volume) 9.4 - 12.4 fl 9.2 Low    Neutrophils 1.50 - 7.80 10^3/uL 5.48   Lymphocytes 1.00 - 3.60 10^3/uL 1.22   Monocytes 0.00 - 1.50 10^3/uL 0.58   Eosinophils 0.00 - 0.55 10^3/uL 0.12   Basophils 0.00 - 0.09 10^3/uL 0.05   Neutrophil % 32.0 - 70.0 % 73.2 High    Lymphocyte % 10.0 - 50.0 % 16.3   Monocyte % 4.0 - 13.0 % 7.8   Eosinophil % 1.0 - 5.0 % 1.6   Basophil% 0.0 - 2.0 % 0.7   Immature Granulocyte % <=0.7 % 0.4   Immature Granulocyte Count <=0.06 10^3/L 0.03   Resulting Agency  [facility] - [facility]   Specimen Collected: 02/08/22 10:31   Performed by: [facility] Last Resulted: 02/08/22 11:29  Received From: [place]  Result Received: 06/04/22 10:10   Hemoglobin A1C 4.2 - 5.6 % 6.5 High    Average Blood Glucose (Calc) mg/dL 140   Resulting Agency  [facility] - LAB  Narrative Performed by [facility] - LAB Normal Range:    4.2 - 5.6%  Increased Risk:  5.7 - 6.4%  Diabetes:        >= 6.5%  Glycemic Control for adults with diabetes:  <7%    Specimen Collected: 02/08/22 10:31   Performed by: [facility] - LAB Last Resulted: 02/08/22 14:23  Received From: [place]  Result Received: 06/04/22 10:10   Color Yellow, Violet, Light Violet, Dark Violet Yellow   Clarity Clear, Other Clear   Specific Gravity 1.000 - 1.030 1.015   pH, Urine 5.0 - 8.0 6.0   Protein, Urinalysis Negative, Trace mg/dL Negative   Glucose, Urinalysis Negative mg/dL Negative   Ketones, Urinalysis Negative mg/dL Negative   Blood, Urinalysis Negative Negative   Nitrite, Urinalysis Negative Negative   Leukocyte Esterase, Urinalysis Negative Large Abnormal    White Blood Cells, Urinalysis None Seen, 0-3 /hpf 10-50 Abnormal     Red Blood Cells, Urinalysis None Seen, 0-3 /hpf 4-10 Abnormal    Bacteria, Urinalysis None Seen /hpf Many Abnormal    Squamous Epithelial Cells, Urinalysis Rare, Few, None Seen /hpf Many Abnormal    Other Epithelial, Urinalysis None Seen Rare Abnormal    Resulting Agency  [facility] - LAB  Narrative Performed by [facility] - LAB Mucus  Transitional Epithelials  Specimen Collected: 02/08/22 10:31   Performed by: [facility] [facility] - LAB Last Resulted: 02/08/22 11:20  Received From: [facility]  Result Received: 06/04/22 10:10   Urinalysis ***  I have reviewed the labs.   Pertinent Imaging: ***   Assessment & Plan:  ***  1. rUTI's ***  2. Mixed incontinence ***  3. Nocturia ***  4. Microscopic hematuria  No follow-ups on file.  These notes generated with voice recognition software. I apologize for typographical errors.  Royden Purl  [place] [address]  [place], [place] [address] [phone]

## 2022-08-24 ENCOUNTER — Encounter: Payer: Self-pay | Admitting: Urology

## 2022-08-24 ENCOUNTER — Ambulatory Visit: Payer: Medicare PPO | Admitting: Urology

## 2022-08-24 VITALS — BP 172/83 | HR 91 | Ht 65.0 in | Wt 172.0 lb

## 2022-08-24 DIAGNOSIS — R3129 Other microscopic hematuria: Secondary | ICD-10-CM | POA: Diagnosis not present

## 2022-08-24 DIAGNOSIS — N390 Urinary tract infection, site not specified: Secondary | ICD-10-CM

## 2022-08-24 DIAGNOSIS — R351 Nocturia: Secondary | ICD-10-CM

## 2022-08-24 DIAGNOSIS — Z87440 Personal history of urinary (tract) infections: Secondary | ICD-10-CM | POA: Diagnosis not present

## 2022-08-24 DIAGNOSIS — N3946 Mixed incontinence: Secondary | ICD-10-CM | POA: Diagnosis not present

## 2022-08-24 LAB — MICROSCOPIC EXAMINATION: Bacteria, UA: NONE SEEN

## 2022-08-24 LAB — URINALYSIS, COMPLETE
Bilirubin, UA: NEGATIVE
Glucose, UA: NEGATIVE
Ketones, UA: NEGATIVE
Leukocytes,UA: NEGATIVE
Nitrite, UA: NEGATIVE
Protein,UA: NEGATIVE
RBC, UA: NEGATIVE
Specific Gravity, UA: <1.005 — ABNORMAL LOW (ref 1.005–1.030)
Urobilinogen, Ur: 0.2 mg/dL (ref 0.2–1.0)
pH, UA: 5 (ref 5.0–7.5)

## 2022-08-24 MED ORDER — TRIMETHOPRIM 100 MG PO TABS: 100.0000 mg | ORAL_TABLET | Freq: Every day | ORAL | 0 refills | Status: DC

## 2022-08-25 ENCOUNTER — Encounter: Payer: Medicare PPO | Attending: Internal Medicine | Admitting: Internal Medicine

## 2022-08-25 ENCOUNTER — Ambulatory Visit
Admission: RE | Admit: 2022-08-25 | Discharge: 2022-08-25 | Disposition: A | Discharge status: Home / Self Care | Payer: Medicare PPO | Source: Ambulatory Visit | Attending: Urology | Admitting: Urology

## 2022-08-25 ENCOUNTER — Ambulatory Visit
Admission: RE | Admit: 2022-08-25 | Discharge: 2022-08-25 | Disposition: A | Discharge status: Home / Self Care | Payer: Medicare PPO | Source: Ambulatory Visit | Attending: Internal Medicine | Admitting: Internal Medicine

## 2022-08-25 ENCOUNTER — Other Ambulatory Visit: Payer: Self-pay | Admitting: Internal Medicine

## 2022-08-25 DIAGNOSIS — I10 Essential (primary) hypertension: Secondary | ICD-10-CM | POA: Diagnosis not present

## 2022-08-25 DIAGNOSIS — Z794 Long term (current) use of insulin: Secondary | ICD-10-CM | POA: Insufficient documentation

## 2022-08-25 DIAGNOSIS — S81801A Unspecified open wound, right lower leg, initial encounter: Secondary | ICD-10-CM | POA: Insufficient documentation

## 2022-08-25 DIAGNOSIS — E11622 Type 2 diabetes mellitus with other skin ulcer: Secondary | ICD-10-CM | POA: Insufficient documentation

## 2022-08-25 DIAGNOSIS — B999 Unspecified infectious disease: Secondary | ICD-10-CM | POA: Insufficient documentation

## 2022-08-25 DIAGNOSIS — E1140 Type 2 diabetes mellitus with diabetic neuropathy, unspecified: Secondary | ICD-10-CM | POA: Insufficient documentation

## 2022-08-25 DIAGNOSIS — X58XXXA Exposure to other specified factors, initial encounter: Secondary | ICD-10-CM | POA: Insufficient documentation

## 2022-08-25 DIAGNOSIS — T798XXA Other early complications of trauma, initial encounter: Secondary | ICD-10-CM | POA: Diagnosis not present

## 2022-08-25 DIAGNOSIS — T792XXA Traumatic secondary and recurrent hemorrhage and seroma, initial encounter: Secondary | ICD-10-CM | POA: Insufficient documentation

## 2022-08-25 DIAGNOSIS — E039 Hypothyroidism, unspecified: Secondary | ICD-10-CM | POA: Diagnosis not present

## 2022-08-25 DIAGNOSIS — I87311 Chronic venous hypertension (idiopathic) with ulcer of right lower extremity: Secondary | ICD-10-CM | POA: Diagnosis not present

## 2022-08-25 DIAGNOSIS — L97818 Non-pressure chronic ulcer of other part of right lower leg with other specified severity: Secondary | ICD-10-CM | POA: Insufficient documentation

## 2022-08-25 DIAGNOSIS — G8929 Other chronic pain: Secondary | ICD-10-CM | POA: Diagnosis not present

## 2022-08-26 NOTE — Progress Notes (Signed)
DIAMANTE, TRUSZKOWSKI ([id number]) Visit Report for 08/25/2022 Chief Complaint Document Details Patient Name: [name], [name]. Date of Service: 08/25/2022 2:30 PM Medical Record Number: [id number] Patient Account Number: [id number] Date of Birth/Sex: October 28, 1941 (81 y.o. F) Treating RN: Cornell Barman Primary Care Provider: [name] Other Clinician: Referring Provider: [name] Treating Provider/Extender: Yaakov Guthrie in Treatment: 0 Information Obtained from: Patient Chief Complaint 08/25/2022; right lower extremity wounds secondary to traumatic hematoma Electronic Signature(s) Signed: 08/25/2022 4:01:33 PM By: Kalman Shan DO Entered By: Kalman Shan on 08/25/2022 15:41:05 [name], [name] B. ([id number]) -------------------------------------------------------------------------------- HPI Details Patient Name: [name] Clipper B. Date of Service: 08/25/2022 2:30 PM Medical Record Number: [id number] Patient Account Number: [id number] Date of Birth/Sex: [birth year] (81 y.o. F) Treating RN: Cornell Barman Primary Care Provider: [name] Other Clinician: Referring Provider: [name] Treating Provider/Extender: Yaakov Guthrie in Treatment: 0 History of Present Illness HPI Description: Admission 08/25/2022 [name] is an 81 year old female with a past medical history of breast cancer, with insulin dependent, controlled type 2 diabetes, chronic venous insufficiency that [name] for a 17-monthhistory of nonhealing ulcer to [name] right lower extremity. She states she hit her leg against a stair and developed a hematoma. [name] area was evacuated by her primary care physician. She has tried Neosporin and wet-to- dry dressings. She reports mild chronic pain to [name] area. She denies increased warmth, erythema or purulent drainage. Electronic Signature(s) Signed: 08/25/2022 4:01:33 PM By: HKalman ShanDO Entered By: HKalman Shanon 08/25/2022  15:48:53 [name], [name]([id number] -------------------------------------------------------------------------------- Physical Exam Details Patient Name: [name], [name] Date of Service: 08/25/2022 2:30 PM Medical Record Number: [id number] Account Number: [id number] of Birth/Sex: 606-Oct-1942((81y.o. F) Treating RN: WCornell BarmanPrimary Care Provider: ZMia CreekOther Clinician: Referring Provider: ZMia CreekTreating Provider/Extender: HYaakov Guthriein Treatment: 0 Constitutional . Cardiovascular . Psychiatric . Notes Right lower extremity: To [name] distal medial aspect there is an open wound with granulation tissue and nonviable tissue. No surrounding soft tissue infection including increased warmth, erythema or purulent drainage. Venous stasis dermatitis. 2+ pitting edema to [name] knee. Electronic Signature(s) Signed: 08/25/2022 4:01:33 PM By: HKalman ShanDO Entered By: HKalman Shanon 08/25/2022 15:49:25 [name]([id number] -------------------------------------------------------------------------------- Physician Orders Details Patient Name: [name] Date of Service: 08/25/2022 2:30 PM Medical Record Number: [id number] Account Number: [id number] of Birth/Sex: 6Feb 25, 1942((81y.o. F) Treating RN: WCornell BarmanPrimary Care Provider: ZMia CreekOther Clinician: Referring Provider: ZMia CreekTreating Provider/Extender: HYaakov Guthriein Treatment: 0 Verbal / Phone Orders: No Diagnosis Coding ICD-10 Coding Code Description [id number]-pressure chronic ulcer of other part of right lower leg with other specified severity I89.0 Lymphedema, not elsewhere classified E11.622 Type 2 diabetes mellitus with other skin ulcer E03.9 Hypothyroidism, unspecified I10 Essential (primary) hypertension T79.8XXA Other early complications of trauma, initial encounter [id number] Traumatic secondary and recurrent  hemorrhage and seroma, initial encounter Follow-up Appointments Wound #1 Right,Medial Lower Leg o Return Appointment in 1 week. Bathing/ Shower/ Hygiene o May shower with wound dressing protected with water repellent cover or cast protector. o No tub bath. Wound Treatment Wound #1 - Lower Leg Wound Laterality: Right, Medial Primary Dressing: Gauze Discharge Instructions: As directed: moistened with Dakins Solution Secondary Dressing: Gauze Discharge Instructions: As directed: dry Secured With: Kerlix Roll Sterile or Non-Sterile 6-ply 4.5x4 (yd/yd) Discharge Instructions: Apply Kerlix as directed Secured With: Tubigrip Size D, 3x10 (in/yd) Radiology o X-ray, lower leg -  Right lower leg Patient Medications Allergies: No Known Drug Allergies Notifications Medication Indication Start End Dakin's Solution 08/25/2022 DOSE 1 - miscellaneous 0.25 % solution - moisten gauze for wet to dry dressings Electronic Signature(s) Signed: 08/25/2022 4:19:15 PM By: Kalman Shan DO Previous Signature: 08/25/2022 4:02:48 PM Version By: Kalman Shan DO Previous Signature: 08/25/2022 4:01:33 PM Version By: Kalman Shan DO Entered By: Kalman Shan on 08/25/2022 16:03:10 [name] ([id number]) [name] ([id number]) -------------------------------------------------------------------------------- Problem List Details Patient Name: [name] Clipper B. Date of Service: 08/25/2022 2:30 PM Medical Record Number: [id number] Patient Account Number: [id number] Date of Birth/Sex: 1941-09-03 (81 y.o. F) Treating RN: Cornell Barman Primary Care Provider: [name] Other Clinician: Referring Provider: [name] Treating Provider/Extender: Yaakov Guthrie in Treatment: 0 Active Problems ICD-10 Encounter Code Description Active Date MDM Diagnosis L97.818 Non-pressure chronic ulcer of other part of right lower leg with other 08/25/2022 No Yes specified  severity T79.8XXA Other early complications of trauma, initial encounter 08/25/2022 No Yes T79.2XXA Traumatic secondary and recurrent hemorrhage and seroma, initial 08/25/2022 No Yes encounter E11.622 Type 2 diabetes mellitus with other skin ulcer 08/25/2022 No Yes E03.9 Hypothyroidism, unspecified 08/25/2022 No Yes I10 Essential (primary) hypertension 08/25/2022 No Yes I87.311 Chronic venous hypertension (idiopathic) with ulcer of right lower 08/25/2022 No Yes extremity Inactive Problems Resolved Problems Electronic Signature(s) Signed: 08/25/2022 4:01:33 PM By: Kalman Shan DO Entered By: Kalman Shan on 08/25/2022 15:51:26 Griffo, [name] B. ([id number]) -------------------------------------------------------------------------------- Progress Note Details Patient Name: [name] Clipper B. Date of Service: 08/25/2022 2:30 PM Medical Record Number: [id number] Patient Account Number: [id number] Date of Birth/Sex: [birth year] (81 y.o. F) Treating RN: Cornell Barman Primary Care Provider: [name] Other Clinician: Referring Provider: [name] Treating Provider/Extender: Yaakov Guthrie in Treatment: 0 Subjective Chief Complaint Information obtained from Patient 08/25/2022; right lower extremity wounds secondary to traumatic hematoma History of Present Illness (HPI) Admission 08/25/2022 [name] is an 81 year old female with a past medical history of breast cancer, with insulin dependent, controlled type 2 diabetes, chronic venous insufficiency that presents [name] for a 80-monthhistory of nonhealing ulcer to [name] right lower extremity. She states she hit her leg against a stair and developed a hematoma. [name] area was evacuated by her primary care physician. She has tried Neosporin and wet-to- dry dressings. She reports mild chronic pain to [name] area. She denies increased warmth, erythema or purulent drainage. Patient History Allergies No Known Drug  Allergies Social History Never smoker, Marital Status - Married, Alcohol Use - Never, Drug Use - No History, Caffeine Use - Daily. Medical History Cardiovascular Patient has history of Hypertension Endocrine Patient has history of Type II Diabetes Musculoskeletal Patient has history of Osteoarthritis - Shoulders Neurologic Patient has history of Neuropathy Oncologic Patient has history of Received Chemotherapy, Received Radiation Patient is treated with Insulin. Blood sugar is tested. Review of Systems (ROS) Hematologic/Lymphatic Denies complaints or symptoms of Bleeding / Clotting Disorders. Respiratory Complains or has symptoms of Shortness of Breath. Cardiovascular Complains or has symptoms of LE edema. Genitourinary Denies complaints or symptoms of Kidney failure/ Dialysis, Incontinence/dribbling. Immunological Denies complaints or symptoms of Hives, Itching. Integumentary (Skin) Complains or has symptoms of Wounds, Bleeding or bruising tendency. Oncologic Breast cancer 23 years ago Objective Constitutional Vitals Time Taken: 2:23 PM, Height: 65 in, Weight: 172 lbs, BMI: 28.6, Temperature: 98.3 F, Pulse: 90 bpm, Respiratory Rate: 18 breaths/min, Blood Pressure: 188/79 mmHg. [name], [name]([id number] General Notes: Right lower extremity: To [name] distal medial aspect there  is an open wound with granulation tissue and nonviable tissue. No surrounding soft tissue infection including increased warmth, erythema or purulent drainage. Venous stasis dermatitis. 2+ pitting edema to [name] knee. Integumentary (Hair, Skin) Wound #1 status is Open. Original cause of wound was Trauma. [name] date acquired was: 06/27/2022. [name] wound is located on [name] Right,Medial Lower Leg. [name] wound measures 4.2cm length x 2cm width x 1.6cm depth; 6.597cm^2 area and 10.556cm^3 volume. There is Fat Layer (Subcutaneous Tissue) exposed. There is a medium amount of serous drainage noted. [name] wound margin is  thickened. There is small (1-33%) red granulation within [name] wound bed. There is a large (67-100%) amount of necrotic tissue within [name] wound bed including Eschar and Adherent Slough. Assessment Active Problems ICD-10 Non-pressure chronic ulcer of other part of right lower leg with other specified severity Other early complications of trauma, initial encounter Traumatic secondary and recurrent hemorrhage and seroma, initial encounter Type 2 diabetes mellitus with other skin ulcer Hypothyroidism, unspecified Essential (primary) hypertension Chronic venous hypertension (idiopathic) with ulcer of right lower extremity Patient presents with a 6-monthhistory of nonhealing ulcer to [name] right lower extremity secondary to trauma complicated by hematoma. She is not on a blood thinner. [name] wound bed appears unhealthy. At this time I recommended Dakin's wet-to-dry dressings. Due to [name] chronicity of [name] wound I recommended also an x-ray as this probes fairly deep. It does not probe to bone. No signs of surrounding infection. Follow-up in 1 week. Plan Follow-up Appointments: Wound #1 Right,Medial Lower Leg: Return Appointment in 1 week. Bathing/ Shower/ Hygiene: May shower with wound dressing protected with water repellent cover or cast protector. No tub bath. Radiology ordered were: X-ray, lower leg - Right lower leg WOUND #1: - Lower Leg Wound Laterality: Right, Medial Primary Dressing: Gauze Discharge Instructions: As directed: moistened with Dakins Solution Secondary Dressing: Gauze Discharge Instructions: As directed: dry Secured With: Kerlix Roll Sterile or Non-Sterile 6-ply 4.5x4 (yd/yd) Discharge Instructions: Apply Kerlix as directed Secured With: Tubigrip Size D, 3x10 (in/yd) 1. Dakin's wet-to-dry dressings 2. X-ray of [name] right leg 3. Follow-up in 1 week Electronic Signature(s) Signed: 08/25/2022 4:01:33 PM By: HKalman ShanDO Entered By: HKalman Shanon 08/25/2022  16:01:05 [name], [name] -------------------------------------------------------------------------------- ROS/PFSH Details Patient Name: [name] Date of Service: 08/25/2022 2:30 PM Medical Record Number: [id number] Account Number: [id number] of Birth/Sex: 609/26/1942((81y.o. F) Treating RN: WCornell BarmanPrimary Care Provider: ZMia CreekOther Clinician: Referring Provider: ZMia CreekTreating Provider/Extender: HYaakov Guthriein Treatment: 0 Hematologic/Lymphatic Complaints and Symptoms: Negative for: Bleeding / Clotting Disorders Respiratory Complaints and Symptoms: Positive for: Shortness of Breath Cardiovascular Complaints and Symptoms: Positive for: LE edema Medical History: Positive for: Hypertension Genitourinary Complaints and Symptoms: Negative for: Kidney failure/ Dialysis; Incontinence/dribbling Immunological Complaints and Symptoms: Negative for: Hives; Itching Integumentary (Skin) Complaints and Symptoms: Positive for: Wounds; Bleeding or bruising tendency Endocrine Medical History: Positive for: Type II Diabetes Time with diabetes: 40+ Treated with: Insulin Blood sugar tested every day: Yes Tested : 67 Musculoskeletal Medical History: Positive for: Osteoarthritis - Shoulders Neurologic Medical History: Positive for: Neuropathy Oncologic Complaints and Symptoms: Review of System Notes: Breast cancer 23 years ago Medical History:[name], [name]([id number] Positive for: Received Chemotherapy; Received Radiation Immunizations Pneumococcal Vaccine: Received Pneumococcal Vaccination: Yes Received Pneumococcal Vaccination On or After 60th Birthday: Yes Implantable Devices None Family and Social History Never smoker; Marital Status - Married; Alcohol Use: Never; Drug Use: No History; Caffeine Use: Daily  Electronic Signature(s) Signed: 08/25/2022 4:01:33 PM By: Kalman Shan DO Signed: 08/26/2022  11:19:49 AM By: Gretta Cool, BSN, RN, CWS, Kim RN, BSN Entered By: Gretta Cool, BSN, RN, CWS, Kim on 08/25/2022 14:50:14 [name] ([id number]) -------------------------------------------------------------------------------- SuperBill Details Patient Name: JAMILLAH, CAMILO B. Date of Service: 08/25/2022 Medical Record Number: [id number] Patient Account Number: [id number] Date of Birth/Sex: October 23, 1941 (81 y.o. F) Treating RN: Cornell Barman Primary Care Provider: [name] Other Clinician: Referring Provider: [name] Treating Provider/Extender: Yaakov Guthrie in Treatment: 0 Diagnosis Coding ICD-10 Codes Code Description [id number] Non-pressure chronic ulcer of other part of right lower leg with other specified severity T79.8XXA Other early complications of trauma, initial encounter T67.2XXA Traumatic secondary and recurrent hemorrhage and seroma, initial encounter I89.0 Lymphedema, not elsewhere classified E11.622 Type 2 diabetes mellitus with other skin ulcer E03.9 Hypothyroidism, unspecified I10 Essential (primary) hypertension Facility Procedures CPT4 Code: 67209470 Description: 99214 - WOUND CARE VISIT-LEV 4 EST PT Modifier: Quantity: 1 Physician Procedures CPT4 Code: 9628366 Description: 29476 - WC PHYS LEVEL 4 - NEW PT Modifier: Quantity: 1 CPT4 Code: Description: ICD-10 Diagnosis Description L97.818 Non-pressure chronic ulcer of other part of right lower leg with other spec T79.8XXA Other early complications of trauma, initial encounter T64.2XXA Traumatic secondary and recurrent hemorrhage and  seroma, initial encounter E11.622 Type 2 diabetes mellitus with other skin ulcer Modifier: ified severity Quantity: Electronic Signature(s) Signed: 08/25/2022 4:01:33 PM By: Kalman Shan DO Entered By: Kalman Shan on 08/25/2022 16:01:13

## 2022-08-26 NOTE — Progress Notes (Signed)
[name], [name] ([id number]) Visit Report for 08/25/2022 Abuse Risk Screen Details Patient Name: [name], [name] B. Date of Service: 08/25/2022 2:30 PM Medical Record Number: [id number] Patient Account Number: [id number] Date of Birth/Sex: November 15, 1941 (81 y.o. F) Treating RN: Cornell Barman Primary Care Morganna Styles: [name] Other Clinician: Referring Joreen Swearingin: [name] Treating Ata Pecha/Extender: Yaakov Guthrie in Treatment: 0 Abuse Risk Screen Items Answer ABUSE RISK SCREEN: Has anyone close to you tried to hurt or harm you recentlyo No Do you feel uncomfortable with anyone in your familyo No Has anyone forced you do things that you didnot want to doo No Electronic Signature(s) Signed: 08/26/2022 11:19:49 AM By: Gretta Cool, BSN, RN, CWS, Kim RN, BSN Entered By: Gretta Cool, BSN, RN, CWS, Kim on 08/25/2022 14:50:21 [name], [name] ([id number]) -------------------------------------------------------------------------------- Activities of Daily Living Details Patient Name: [name], [name] Date of Service: 08/25/2022 2:30 PM Medical Record Number: [id number] Patient Account Number: [id number] Date of Birth/Sex: 08-30-1941 (81 y.o. F) Treating RN: Cornell Barman Primary Care Leahanna Buser: [name] Other Clinician: Referring Yun Gutierrez: [name] Treating Grason Brailsford/Extender: Yaakov Guthrie in Treatment: 0 Activities of Daily Living Items Answer Activities of Daily Living (Please select one for each item) Drive Automobile Completely Able Take Medications Completely Able Use Telephone Completely Able Care for Appearance Completely Able Use Toilet Completely Able Bath / Shower Completely Able Dress Self Completely Able Feed Self Completely Able Walk Completely Able Get In / Out Bed Completely Able Housework Completely Able Prepare Meals Completely Able Handle Money Completely Able Shop for Self Completely Able Electronic Signature(s) Signed: 08/26/2022 11:19:49 AM By:  Gretta Cool, BSN, RN, CWS, Kim RN, BSN Entered By: Gretta Cool, BSN, RN, CWS, Kim on 08/25/2022 14:50:57 [name] ([id number]) -------------------------------------------------------------------------------- Education Screening Details Patient Name: [name] Clipper B. Date of Service: 08/25/2022 2:30 PM Medical Record Number: [id number] Patient Account Number: [id number] Date of Birth/Sex: 12-Sep-1941 (81 y.o. F) Treating RN: Cornell Barman Primary Care Paulanthony Gleaves: [name] Other Clinician: Referring Latoi Giraldo: [name] Treating Merdith Adan/Extender: Yaakov Guthrie in Treatment: 0 Primary Learner Assessed: Patient Learning Preferences/Education Level/Primary Language Learning Preference: Explanation, Demonstration Highest Education Level: College or Above Preferred Language: English Cognitive Barrier Language Barrier: No Translator Needed: No Memory Deficit: No Emotional Barrier: No Physical Barrier Impaired Vision: No Impaired Hearing: No Decreased Hand dexterity: No Knowledge/Comprehension Knowledge Level: High Comprehension Level: High Ability to understand written instructions: High Ability to understand verbal instructions: High Motivation Anxiety Level: Calm Cooperation: Cooperative Education Importance: Acknowledges Need Interest in Health Problems: Asks Questions Perception: Coherent Willingness to Engage in Self-Management High Activities: Readiness to Engage in Self-Management High Activities: Electronic Signature(s) Signed: 08/26/2022 11:19:49 AM By: Gretta Cool, BSN, RN, CWS, Kim RN, BSN Entered By: Gretta Cool, BSN, RN, CWS, Kim on 08/25/2022 14:51:27 [name] ([id number]) -------------------------------------------------------------------------------- Fall Risk Assessment Details Patient Name: [name] Clipper B. Date of Service: 08/25/2022 2:30 PM Medical Record Number: [id number] Patient Account Number: [id number] Date of Birth/Sex: December 17, 1940 (81 y.o.  F) Treating RN: Cornell Barman Primary Care Darelle Kings: [name] Other Clinician: Referring Trayveon Beckford: [name] Treating Vineta Carone/Extender: Yaakov Guthrie in Treatment: 0 Fall Risk Assessment Items Have you had 2 or more falls in the last 12 monthso 0 No Have you had any fall that resulted in injury in the last 12 monthso 0 No FALLS RISK SCREEN History of falling - immediate or within 3 months 25 Yes Secondary diagnosis (Do you have 2 or more medical diagnoseso) 0 No Ambulatory aid None/bed rest/wheelchair/nurse 0 Yes Crutches/cane/walker 0 No Furniture 0 No Intravenous  therapy Access/Saline/Heparin Lock 0 No Gait/Transferring Normal/ bed rest/ wheelchair 0 Yes Weak (short steps with or without shuffle, stooped but able to lift head while walking, may 0 No seek support from furniture) Impaired (short steps with shuffle, may have difficulty arising from chair, head down, impaired 0 No balance) Mental Status Oriented to own ability 0 Yes Electronic Signature(s) Signed: 08/26/2022 11:19:49 AM By: Gretta Cool, BSN, RN, CWS, Kim RN, BSN Entered By: Gretta Cool, BSN, RN, CWS, Kim on 08/25/2022 14:51:40 [name], [name] ([id number]) -------------------------------------------------------------------------------- Foot Assessment Details Patient Name: [name] Clipper B. Date of Service: 08/25/2022 2:30 PM Medical Record Number: [id number] Patient Account Number: [id number] Date of Birth/Sex: 20-Feb-1941 (81 y.o. F) Treating RN: Cornell Barman Primary Care Daphney Hopke: [name] Other Clinician: Referring Polk Minor: [name] Treating Chrystian Cupples/Extender: Yaakov Guthrie in Treatment: 0 Foot Assessment Items Site Locations + = Sensation present, - = Sensation absent, C = Callus, U = Ulcer R = Redness, W = Warmth, M = Maceration, PU = Pre-ulcerative lesion F = Fissure, S = Swelling, D = Dryness Assessment Right: Left: Other Deformity: No No Prior Foot Ulcer: No No Prior  Amputation: No No Charcot Joint: No No Ambulatory Status: Ambulatory Without Help Gait: Steady Electronic Signature(s) Signed: 08/26/2022 11:19:49 AM By: Gretta Cool, BSN, RN, CWS, Kim RN, BSN Entered By: Gretta Cool, BSN, RN, CWS, Kim on 08/25/2022 14:52:52 [name], [name] ([id number]) -------------------------------------------------------------------------------- Nutrition Risk Screening Details Patient Name: [name] Clipper B. Date of Service: 08/25/2022 2:30 PM Medical Record Number: [id number] Patient Account Number: [id number] Date of Birth/Sex: January 18, 1941 (81 y.o. F) Treating RN: Cornell Barman Primary Care Jamyia Fortune: [name] Other Clinician: Referring Latesha Chesney: [name] Treating Niyanna Asch/Extender: Yaakov Guthrie in Treatment: 0 Height (in): 65 Weight (lbs): 172 Body Mass Index (BMI): 28.6 Nutrition Risk Screening Items Score Screening NUTRITION RISK SCREEN: I have an illness or condition that made me change the kind and/or amount of food I eat 0 No I eat fewer than two meals per day 0 No I eat few fruits and vegetables, or milk products 0 No I have three or more drinks of beer, liquor or wine almost every day 0 No I have tooth or mouth problems that make it hard for me to eat 0 No I don't always have enough money to buy the food I need 0 No I eat alone most of the time 0 No I take three or more different prescribed or over-the-counter drugs a day 0 No Without wanting to, I have lost or gained 10 pounds in the last six months 0 No I am not always physically able to shop, cook and/or feed myself 2 Yes Nutrition Protocols Good Risk Protocol 0 No interventions needed Moderate Risk Protocol High Risk Proctocol Risk Level: Good Risk Score: 2 Electronic Signature(s) Signed: 08/26/2022 11:19:49 AM By: Gretta Cool, BSN, RN, CWS, Kim RN, BSN Entered By: Gretta Cool, BSN, RN, CWS, Kim on 08/25/2022 14:51:50

## 2022-08-26 NOTE — Progress Notes (Signed)
CAMRIE, STOCK ([id number]) Visit Report for 08/25/2022 Allergy List Details Patient Name: [name], [name]. Date of Service: 08/25/2022 2:30 PM Medical Record Number: [id number] Patient Account Number: [id number] Date of Birth/Sex: 1941-07-28 (81 y.o. F) Treating RN: Cornell Barman Primary Care Ezekial Arns: [name] Other Clinician: Referring Tynan Boesel: [name] Treating Treasa Bradshaw/Extender: Yaakov Guthrie in Treatment: 0 Allergies Active Allergies atorvastatin azithromycin Cipro doxycycline Hycodan rosiglitazone Allergy Notes Electronic Signature(s) Signed: 08/25/2022 4:08:24 PM By: Gretta Cool, BSN, RN, CWS, Kim RN, BSN Entered By: Gretta Cool, BSN, RN, CWS, Kim on 08/25/2022 16:08:23 [name] ([id number]) -------------------------------------------------------------------------------- Arrival Information Details Patient Name: [name], [name] B. Date of Service: 08/25/2022 2:30 PM Medical Record Number: [id number] Patient Account Number: [id number] Date of Birth/Sex: 02-03-1941 (81 y.o. F) Treating RN: Cornell Barman Primary Care Ivionna Verley: [name] Other Clinician: Referring Georgine Wiltse: [name] Treating Marquis Diles/Extender: Yaakov Guthrie in Treatment: 0 Visit Information Patient Arrived: Ambulatory Arrival Time: 14:42 Accompanied By: daughter Transfer Assistance: None Patient Identification Verified: Yes Secondary Verification Process Completed: Yes Patient Requires Transmission-Based No Precautions: Patient Has Alerts: Yes Patient Alerts: Patient on Blood Thinner 28m aspirin Type II Diabetic Electronic Signature(s) Signed: 08/26/2022 11:19:49 AM By: WGretta Cool BSN, RN, CWS, Kim RN, BSN Entered By: WGretta Cool BSN, RN, CWS, Kim on 08/25/2022 14:42:44 [name], [name]([id number] -------------------------------------------------------------------------------- Clinic Level of Care Assessment Details Patient Name: EELEANORE, [name] Date of Service:  08/25/2022 2:30 PM Medical Record Number: [id number] Account Number: [id number] of Birth/Sex: 606-18-1942((81y.o. F) Treating RN: WCornell BarmanPrimary Care Hadas Jessop: ZMia CreekOther Clinician: Referring Santhosh Gulino: ZMia CreekTreating Badr Piedra/Extender: HYaakov Guthriein Treatment: 0 Clinic Level of Care Assessment Items TOOL 2 Quantity Score _0  - Use when only an EandM is performed on the INITIAL visit 0 ASSESSMENTS - Nursing Assessment / Reassessment X - General Physical Exam (combine w/ comprehensive assessment (listed just below) when performed on new 1 20 pt. evals) X- 1 25 Comprehensive Assessment (HX, ROS, Risk Assessments, Wounds Hx, etc.) ASSESSMENTS - Wound and Skin Assessment / Reassessment X - Simple Wound Assessment / Reassessment - one wound 1 5 _1  - 0 Complex Wound Assessment / Reassessment - multiple wounds _2  - 0 Dermatologic / Skin Assessment (not related to wound area) ASSESSMENTS - Ostomy and/or Continence Assessment and Care _3  - Incontinence Assessment and Management 0 _4  - 0 Ostomy Care Assessment and Management (repouching, etc.) PROCESS - Coordination of Care X - Simple Patient / Family Education for ongoing care 1 15 _5  - 0 Complex (extensive) Patient / Family Education for ongoing care X- 1 10 Staff obtains CProgrammer, systems Records, Test Results / Process Orders _6  - 0 Staff telephones HHA, Nursing Homes / Clarify orders / etc _7  - 0 Routine Transfer to another Facility (non-emergent condition) _8  - 0 Routine Hospital Admission (non-emergent condition) _9  - 0 New Admissions / IBiomedical engineer/ Ordering NPWT, Apligraf, etc. _10  - 0 Emergency Hospital Admission (emergent condition) X- 1 10 Simple Discharge Coordination _11  - 0 Complex (extensive) Discharge Coordination PROCESS - Special Needs _12  - Pediatric / Minor Patient Management 0 _13  - 0 Isolation Patient Management _14  - 0 Hearing / Language / Visual special  needs _15  - 0 Assessment of Community assistance (transportation, D/C planning, etc.) _16  - 0 Additional assistance / Altered mentation _17  - 0 Support Surface(s) Assessment (bed, cushion, seat, etc.) INTERVENTIONS - Wound Cleansing / Measurement X - Wound Imaging (photographs - any number of wounds) 1 5 _18  - 0 Wound Tracing (instead of photographs)  X- 1 5 Simple Wound Measurement - one wound _0  - 0 Complex Wound Measurement - multiple wounds [name], [name] B. ([id number]) X- 1 5 Simple Wound Cleansing - one wound _1  - 0 Complex Wound Cleansing - multiple wounds INTERVENTIONS - Wound Dressings _2  - Small Wound Dressing one or multiple wounds 0 X- 1 15 Medium Wound Dressing one or multiple wounds _3  - 0 Large Wound Dressing one or multiple wounds <[id number]>_7<\/[id number]>_9  - 0 Application of Medications - injection INTERVENTIONS - Miscellaneous _5  - External ear exam 0 _6  - 0 Specimen Collection (cultures, biopsies, blood, body fluids, etc.) _7  - 0 Specimen(s) / Culture(s) sent or taken to Lab for analysis _8  - 0 Patient Transfer (multiple staff / Civil Service fast streamer / Similar devices) _9  - 0 Simple Staple / Suture removal (25 or less) _10  - 0 Complex Staple / Suture removal (26 or more) _11  - 0 Hypo / Hyperglycemic Management (close monitor of Blood Glucose) X- 1 15 Ankle / Brachial Index (ABI) - do not check if billed separately Has the patient been seen at the hospital within the last three years: Yes Total Score: 130 Level Of Care: New/Established - Level 4 Electronic Signature(s) Signed: 08/26/2022 11:19:49 AM By: Gretta Cool, BSN, RN, CWS, Kim RN, BSN Entered By: Gretta Cool, BSN, RN, CWS, Kim on 08/25/2022 15:42:51 Manganaro, [name] ([id number]) -------------------------------------------------------------------------------- Encounter Discharge Information Details Patient Name: [name] Clipper B. Date of Service: 08/25/2022 2:30 PM Medical Record Number: [id number] Patient Account Number: [id number] Date  of Birth/Sex: 1941-10-22 (81 y.o. F) Treating RN: Cornell Barman Primary Care Levonte Molina: [name] Other Clinician: Referring Brighid Koch: [name] Treating Telma Pyeatt/Extender: Yaakov Guthrie in Treatment: 0 Encounter Discharge Information Items Discharge Condition: Stable Ambulatory Status: Ambulatory Discharge Destination: Home Transportation: Private Auto Schedule Follow-up Appointment: Yes Clinical Summary of Care: Electronic Signature(s) Signed: 08/26/2022 11:19:49 AM By: Gretta Cool, BSN, RN, CWS, Kim RN, BSN Entered By: Gretta Cool, BSN, RN, CWS, Kim on 08/25/2022 15:44:56 [name] ([id number]) -------------------------------------------------------------------------------- Lower Extremity Assessment Details Patient Name: CHERREE, CONERLY B. Date of Service: 08/25/2022 2:30 PM Medical Record Number: [id number] Patient Account Number: [id number] Date of Birth/Sex: [birth year] (81 y.o. F) Treating RN: Cornell Barman Primary Care Katlyne Nishida: [name] Other Clinician: Referring Bryahna Lesko: [name] Treating Chloe Baig/Extender: Yaakov Guthrie in Treatment: 0 Edema Assessment Assessed: [name]: No] Patrice Paradise: Yes] Edema: [Left: Ye] [Right: s] Calf Left: Right: Point of Measurement: 30 cm From Medial Instep 35 cm Ankle Left: Right: Point of Measurement: 10 cm From Medial Instep 23 cm Vascular Assessment Pulses: Dorsalis Pedis Palpable: [Right:Yes] Doppler Audible: [Right:Yes] Posterior Tibial Palpable: [Right:Yes] Doppler Audible: [Right:Yes] Blood Pressure: Brachial: [Right:142] Ankle: [Right:Dorsalis Pedis: 130 0.92] Electronic Signature(s) Signed: 08/26/2022 11:19:49 AM By: Gretta Cool, BSN, RN, CWS, Kim RN, BSN Entered By: Gretta Cool, BSN, RN, CWS, Kim on 08/25/2022 15:07:57 [name], [name] ([id number]) -------------------------------------------------------------------------------- Multi Wound Chart Details Patient Name: [name] Clipper B. Date of Service: 08/25/2022  2:30 PM Medical Record Number: [id number] Patient Account Number: [id number] Date of Birth/Sex: 06-05-1941 (81 y.o. F) Treating RN: Cornell Barman Primary Care Undray Allman: [name] Other Clinician: Referring Naara Kelty: [name] Treating Dakisha Schoof/Extender: Yaakov Guthrie in Treatment: 0 Vital Signs Height(in): 65 Pulse(bpm): 87 Weight(lbs): 172 Blood Pressure(mmHg): 188/79 Body Mass Index(BMI): 28.6 Temperature(F): 98.3 Respiratory Rate(breaths/min): 18 Photos: [N/A:N/A] Wound Location: Right, Medial Lower Leg N/A N/A Wounding Event: Trauma N/A N/A Primary Etiology: Diabetic Wound/Ulcer of the Lower N/A N/A Extremity Comorbid History: Hypertension, Type II Diabetes, N/A N/A Osteoarthritis, Neuropathy, Received Chemotherapy, Received Radiation Date Acquired: 06/27/2022 N/A  N/A Weeks of Treatment: 0 N/A N/A Wound Status: Open N/A N/A Wound Recurrence: No N/A N/A Measurements L x W x D (cm) 4.2x2x1.6 N/A N/A Area (cm) : 6.597 N/A N/A Volume (cm) : 10.556 N/A N/A % Reduction in Area: 0.00% N/A N/A % Reduction in Volume: 0.00% N/A N/A Classification: Unable to visualize wound bed N/A N/A Exudate Amount: Medium N/A N/A Exudate Type: Serous N/A N/A Exudate Color: amber N/A N/A Wound Margin: Thickened N/A N/A Granulation Amount: Small (1-33%) N/A N/A Granulation Quality: Red N/A N/A Necrotic Amount: Large (67-100%) N/A N/A Necrotic Tissue: Eschar, Adherent Slough N/A N/A Exposed Structures: Fat Layer (Subcutaneous Tissue): N/A N/A Yes Fascia: No Tendon: No Muscle: No Joint: No Bone: No Treatment Notes Electronic Signature(s) Signed: 08/25/2022 4:01:33 PM By: Kalman Shan DO Entered By: Kalman Shan on 08/25/2022 15:40:38 Barrack, [name] ([id number]) [name], [name] ([id number]) -------------------------------------------------------------------------------- [place] Details Patient Name: BRYONA, FOXWORTHY B. Date of Service:  08/25/2022 2:30 PM Medical Record Number: [id number] Patient Account Number: [id number] Date of Birth/Sex: 05/02/1941 (81 y.o. F) Treating RN: Cornell Barman Primary Care Macil Crady: [name] Other Clinician: Referring Seth Higginbotham: [name] Treating Guinevere Stephenson/Extender: Yaakov Guthrie in Treatment: 0 Active Inactive Necrotic Tissue Nursing Diagnoses: Impaired tissue integrity related to necrotic/devitalized tissue Knowledge deficit related to management of necrotic/devitalized tissue Goals: Necrotic/devitalized tissue will be minimized in the wound bed Date Initiated: 08/25/2022 Target Resolution Date: 08/25/2022 Goal Status: Active Patient/caregiver will verbalize understanding of reason and process for debridement of necrotic tissue Date Initiated: 08/25/2022 Target Resolution Date: 08/25/2022 Goal Status: Active Interventions: Assess patient pain level pre-, during and post procedure and prior to discharge Provide education on necrotic tissue and debridement process Treatment Activities: Excisional debridement : 08/25/2022 Notes: Orientation to the Wound Care Program Nursing Diagnoses: Knowledge deficit related to the wound healing center program Goals: Patient/caregiver will verbalize understanding of the [place] Date Initiated: 08/25/2022 Target Resolution Date: 08/25/2022 Goal Status: Active Interventions: Provide education on orientation to the wound center Notes: Pain, Acute or Chronic Nursing Diagnoses: Pain Management - Cyclic Acute (Dressing Change Related) Pain, acute or chronic: actual or potential Potential alteration in comfort, pain Goals: Patient will verbalize adequate pain control and receive pain control interventions during procedures as needed Date Initiated: 08/25/2022 Target Resolution Date: 08/25/2022 Goal Status: Active Patient/caregiver will verbalize adequate pain control between visits Date Initiated: 08/25/2022 Target  Resolution Date: 08/25/2022 Goal Status: Active Patient/caregiver will verbalize comfort level met Date Initiated: 08/25/2022 Target Resolution Date: 08/25/2022 [name], [name] ([id number]) Goal Status: Active Interventions: Provide education on pain management Reposition patient for comfort Treatment Activities: Administer pain control measures as ordered : 08/25/2022 Notes: Peripheral Neuropathy Nursing Diagnoses: Knowledge deficit related to disease process and management of peripheral neurovascular dysfunction Potential alteration in peripheral tissue perfusion (select prior to confirmation of diagnosis) Goals: Patient/caregiver will verbalize understanding of disease process and disease management Date Initiated: 08/25/2022 Target Resolution Date: 08/25/2022 Goal Status: Active Interventions: Assess signs and symptoms of neuropathy upon admission and as needed Provide education on Management of Neuropathy and Related Ulcers Notes: Wound/Skin Impairment Nursing Diagnoses: Impaired tissue integrity Knowledge deficit related to smoking impact on wound healing Knowledge deficit related to ulceration/compromised skin integrity Goals: Patient/caregiver will verbalize understanding of skin care regimen Date Initiated: 08/25/2022 Target Resolution Date: 08/25/2022 Goal Status: Active Ulcer/skin breakdown will have a volume reduction of 30% by week 4 Date Initiated: 08/25/2022 Target Resolution Date: 09/22/2022 Goal Status: Active Interventions: Assess ulceration(s) every visit Provide  education on smoking Notes: Electronic Signature(s) Signed: 08/26/2022 11:19:49 AM By: Gretta Cool, BSN, RN, CWS, Kim RN, BSN Entered By: Gretta Cool, BSN, RN, CWS, Kim on 08/25/2022 15:19:37 [name] ([id number]) -------------------------------------------------------------------------------- Pain Assessment Details Patient Name: [name], [name] B. Date of Service: 08/25/2022 2:30 PM Medical Record  Number: [id number] Patient Account Number: [id number] Date of Birth/Sex: 1941-04-23 (81 y.o. F) Treating RN: Cornell Barman Primary Care Cyann Venti: [name] Other Clinician: Referring Avana Kreiser: [name] Treating Cosby Proby/Extender: Yaakov Guthrie in Treatment: 0 Active Problems Location of Pain Severity and Description of Pain Patient Has Paino No Site Locations Pain Management and Medication Current Pain Management: Notes Patient denies pain at this time. Electronic Signature(s) Signed: 08/26/2022 11:19:49 AM By: Gretta Cool, BSN, RN, CWS, Kim RN, BSN Entered By: Gretta Cool, BSN, RN, CWS, Kim on 08/25/2022 14:43:09 [name] ([id number]) -------------------------------------------------------------------------------- Patient/Caregiver Education Details Patient Name: [name] Date of Service: 08/25/2022 2:30 PM Medical Record Number: [id number] Patient Account Number: [id number] Date of Birth/Gender: 01-Dec-1941 (81 y.o. F) Treating RN: Cornell Barman Primary Care Physician: [name] Other Clinician: Referring Physician: [name] Treating Physician/Extender: Yaakov Guthrie in Treatment: 0 Education Assessment Education Provided To: Patient Education Topics Provided Peripheral Neuropathy: Handouts: Neuropathy Methods: Demonstration, Explain/Verbal Responses: State content correctly Welcome To [facility] [place]: Handouts: Welcome To [facility] [place] Methods: Demonstration, Explain/Verbal Responses: State content correctly Wound/Skin Impairment: Handouts: Caring for Your Ulcer Methods: Demonstration, Explain/Verbal Responses: State content correctly Electronic Signature(s) Signed: 08/26/2022 11:19:49 AM By: Gretta Cool, BSN, RN, CWS, Kim RN, BSN Entered By: Gretta Cool, BSN, RN, CWS, Kim on 08/25/2022 15:43:23 [name], [name] ([id number]) -------------------------------------------------------------------------------- Wound Assessment  Details Patient Name: [name], DELSIGNORE B. Date of Service: 08/25/2022 2:30 PM Medical Record Number: [id number] Patient Account Number: [id number] Date of Birth/Sex: 1941/02/06 (81 y.o. F) Treating RN: Cornell Barman Primary Care Everest Brod: [name] Other Clinician: Referring Naylee Frankowski: [name] Treating Rylynne Schicker/Extender: Yaakov Guthrie in Treatment: 0 Wound Status Wound Number: 1 Primary Diabetic Wound/Ulcer of the Lower Extremity Etiology: Wound Location: Right, Medial Lower Leg Wound Open Wounding Event: Trauma Status: Date Acquired: 06/27/2022 Comorbid Hypertension, Type II Diabetes, Osteoarthritis, Weeks Of Treatment: 0 History: Neuropathy, Received Chemotherapy, Received Radiation Clustered Wound: No Photos Wound Measurements Length: (cm) 4.2 % Re Width: (cm) 2 % Re Depth: (cm) 1.6 Area: (cm) 6.597 Volume: (cm) 10.556 duction in Area: 0% duction in Volume: 0% Wound Description Classification: Unable to visualize wound bed Foul Wound Margin: Thickened Slou Exudate Amount: Medium Exudate Type: Serous Exudate Color: amber Odor After Cleansing: No gh/Fibrino Yes Wound Bed Granulation Amount: Small (1-33%) Exposed Structure Granulation Quality: Red Fascia Exposed: No Necrotic Amount: Large (67-100%) Fat Layer (Subcutaneous Tissue) Exposed: Yes Necrotic Quality: Eschar, Adherent Slough Tendon Exposed: No Muscle Exposed: No Joint Exposed: No Bone Exposed: No Treatment Notes Wound #1 (Lower Leg) Wound Laterality: Right, Medial Cleanser Peri-Wound Care Topical Coil, Graciana B. ([id number]) Primary Dressing Gauze Discharge Instruction: As directed: moistened with Dakins Solution Secondary Dressing Gauze Discharge Instruction: As directed: dry Secured With Kerlix Roll Sterile or Non-Sterile 6-ply 4.5x4 (yd/yd) Discharge Instruction: Apply Kerlix as directed Tubigrip Size D, 3x10 (in/yd) Compression Wrap Compression Stockings Add-Ons Electronic  Signature(s) Signed: 08/26/2022 11:19:49 AM By: Gretta Cool, BSN, RN, CWS, Kim RN, BSN Entered By: Gretta Cool, BSN, RN, CWS, Kim on 08/25/2022 15:30:13 Hine, [name] ([id number]) -------------------------------------------------------------------------------- Vitals Details Patient Name: [name] Clipper B. Date of Service: 08/25/2022 2:30 PM Medical Record Number: [id number] Patient Account Number: [id number] Date of Birth/Sex: 1940/12/23 (81 y.o. F)  Treating RN: Cornell Barman Primary Care Joyceline Maiorino: [name] Other Clinician: Referring Teddi Badalamenti: [name] Treating Deano Tomaszewski/Extender: Yaakov Guthrie in Treatment: 0 Vital Signs Time Taken: 14:23 Temperature (F): 98.3 Height (in): 65 Pulse (bpm): 90 Weight (lbs): 172 Respiratory Rate (breaths/min): 18 Body Mass Index (BMI): 28.6 Blood Pressure (mmHg): 188/79 Reference Range: 80 - 120 mg / dl Electronic Signature(s) Signed: 08/26/2022 11:19:49 AM By: Gretta Cool, BSN, RN, CWS, Kim RN, BSN Entered By: Gretta Cool, BSN, RN, CWS, Kim on 08/25/2022 14:43:47

## 2022-09-01 ENCOUNTER — Encounter (HOSPITAL_BASED_OUTPATIENT_CLINIC_OR_DEPARTMENT_OTHER): Payer: Medicare PPO | Admitting: Internal Medicine

## 2022-09-01 DIAGNOSIS — L97818 Non-pressure chronic ulcer of other part of right lower leg with other specified severity: Secondary | ICD-10-CM | POA: Diagnosis not present

## 2022-09-01 DIAGNOSIS — E11622 Type 2 diabetes mellitus with other skin ulcer: Secondary | ICD-10-CM | POA: Diagnosis not present

## 2022-09-03 NOTE — Progress Notes (Signed)
LINAH, [name] ([id number]) Visit Report for 09/01/2022 Arrival Information Details Patient Name: [name], [name]. Date of Service: 09/01/2022 3:00 PM Medical Record Number: [id number] Patient Account Number: [id number] Date of Birth/Sex: [birth year] (81 y.o. F) Treating RN: Carlene Coria Primary Care Lyann Hagstrom: [name] Other Clinician: Massie Kluver Referring Avid Guillette: [name] Treating Analiz Tvedt/Extender: Yaakov Guthrie in Treatment: 1 Visit Information History Since Last Visit All ordered tests and consults were completed: No Patient Arrived: Ambulatory Added or deleted any medications: No Arrival Time: 15:12 Any new allergies or adverse reactions: No Transfer Assistance: None Had a fall or experienced change in No Patient Requires Transmission-Based No activities of daily living that may affect Precautions: risk of falls: Patient Has Alerts: Yes Hospitalized since last visit: No Patient Alerts: Patient on Blood Pain Present Now: No Thinner 50m aspirin Type II Diabetic Electronic Signature(s) Signed: 09/02/2022 11:41:41 AM By: VMassie KluverEntered By: VMassie Kluveron 09/01/2022 15:22:52 Rayson, [place] ([id number] -------------------------------------------------------------------------------- Clinic Level of Care Assessment Details Patient Name: [name] ClipperB. Date of Service: 09/01/2022 3:00 PM Medical Record Number: [id number] Account Number: [id number] of Birth/Sex: 606/06/1941((81y.o. F) Treating RN: ECarlene CoriaPrimary Care Jsoeph Podesta: ZMia CreekOther Clinician: VMassie KluverReferring Gertie Broerman: ZMia CreekTreating Keen Ewalt/Extender: HYaakov Guthriein Treatment: 1 Clinic Level of Care Assessment Items TOOL 1 Quantity Score '[]'  - Use when EandM and Procedure is performed on INITIAL visit 0 ASSESSMENTS - Nursing Assessment / Reassessment '[]'  - General Physical Exam (combine w/ comprehensive assessment (listed  just below) when performed on new 0 pt. evals) '[]'  - 0 Comprehensive Assessment (HX, ROS, Risk Assessments, Wounds Hx, etc.) ASSESSMENTS - Wound and Skin Assessment / Reassessment '[]'  - Dermatologic / Skin Assessment (not related to wound area) 0 ASSESSMENTS - Ostomy and/or Continence Assessment and Care '[]'  - Incontinence Assessment and Management 0 '[]'  - 0 Ostomy Care Assessment and Management (repouching, etc.) PROCESS - Coordination of Care '[]'  - Simple Patient / Family Education for ongoing care 0 '[]'  - 0 Complex (extensive) Patient / Family Education for ongoing care '[]'  - 0 Staff obtains CProgrammer, systems Records, Test Results / Process Orders '[]'  - 0 Staff telephones HHA, Nursing Homes / Clarify orders / etc '[]'  - 0 Routine Transfer to another Facility (non-emergent condition) '[]'  - 0 Routine Hospital Admission (non-emergent condition) '[]'  - 0 New Admissions / IBiomedical engineer/ Ordering NPWT, Apligraf, etc. '[]'  - 0 Emergency Hospital Admission (emergent condition) PROCESS - Special Needs '[]'  - Pediatric / Minor Patient Management 0 '[]'  - 0 Isolation Patient Management '[]'  - 0 Hearing / Language / Visual special needs '[]'  - 0 Assessment of Community assistance (transportation, D/C planning, etc.) '[]'  - 0 Additional assistance / Altered mentation '[]'  - 0 Support Surface(s) Assessment (bed, cushion, seat, etc.) INTERVENTIONS - Miscellaneous '[]'  - External ear exam 0 '[]'  - 0 Patient Transfer (multiple staff / HCivil Service fast streamer/ Similar devices) '[]'  - 0 Simple Staple / Suture removal (25 or less) '[]'  - 0 Complex Staple / Suture removal (26 or more) '[]'  - 0 Hypo/Hyperglycemic Management (do not check if billed separately) '[]'  - 0 Ankle / Brachial Index (ABI) - do not check if billed separately Has the patient been seen at the hospital within the last three years: Yes Total Score: 0 Level Of Care: ____ ESatira Mccallum([id number] Electronic Signature(s) Signed: 09/02/2022 11:41:41 AM  By: VMassie KluverEntered By: VMassie Kluveron 09/01/2022 15:53:59 [name], [name] B. ([id number] -------------------------------------------------------------------------------- Encounter Discharge Information Details Patient  Name: [name], [name]. Date of Service: 09/01/2022 3:00 PM Medical Record Number: [id number] Patient Account Number: [id number] Date of Birth/Sex: 1941-06-05 (81 y.o. F) Treating RN: Carlene Coria Primary Care Japleen Tornow: [name] Other Clinician: Massie Kluver Referring Meko Masterson: [name] Treating Krithi Bray/Extender: Yaakov Guthrie in Treatment: 1 Encounter Discharge Information Items Post Procedure Vitals Discharge Condition: Stable Temperature (F): 97.9 Ambulatory Status: Ambulatory Pulse (bpm): 91 Discharge Destination: Home Respiratory Rate (breaths/min): 18 Transportation: Private Auto Blood Pressure (mmHg): 136/69 Accompanied By: daughter Schedule Follow-up Appointment: No Clinical Summary of Care: Electronic Signature(s) Signed: 09/02/2022 11:41:41 AM By: Massie Kluver Entered By: Massie Kluver on 09/01/2022 17:15:55 [name], [name] ([id number]) -------------------------------------------------------------------------------- Lower Extremity Assessment Details Patient Name: [name] Clipper B. Date of Service: 09/01/2022 3:00 PM Medical Record Number: [id number] Patient Account Number: [id number] Date of Birth/Sex: 1941/01/22 (81 y.o. F) Treating RN: Carlene Coria Primary Care Terre Zabriskie: [name] Other Clinician: Massie Kluver Referring Makari Sanko: [name] Treating Lazara Grieser/Extender: Yaakov Guthrie in Treatment: 1 Edema Assessment Assessed: [name]: No] Patrice Paradise: Yes] Edema: [Left: Ye] [Right: s] Calf Left: Right: Point of Measurement: 30 cm From Medial Instep 33.4 cm Ankle Left: Right: Point of Measurement: 10 cm From Medial Instep 24.2 cm Vascular Assessment Pulses: Dorsalis Pedis Palpable:  [Right:Yes] Posterior Tibial Palpable: [Right:Yes] Electronic Signature(s) Signed: 09/02/2022 11:41:41 AM By: Massie Kluver Signed: 09/03/2022 9:44:45 AM By: Carlene Coria RN Entered By: Massie Kluver on 09/01/2022 15:35:05 Yilmaz, [place] ([id number]) -------------------------------------------------------------------------------- Multi Wound Chart Details Patient Name: [name] Clipper B. Date of Service: 09/01/2022 3:00 PM Medical Record Number: [id number] Patient Account Number: [id number] Date of Birth/Sex: 1941-04-25 (81 y.o. F) Treating RN: Carlene Coria Primary Care Dreux Mcgroarty: [name] Other Clinician: Massie Kluver Referring Jamari Diana: [name] Treating Charline Hoskinson/Extender: Yaakov Guthrie in Treatment: 1 Vital Signs Height(in): 65 Pulse(bpm): 84 Weight(lbs): 172 Blood Pressure(mmHg): 136/69 Body Mass Index(BMI): 28.6 Temperature(F): 97.9 Respiratory Rate(breaths/min): 18 Photos: [N/A:N/A] Wound Location: Right, Medial Lower Leg N/A N/A Wounding Event: Trauma N/A N/A Primary Etiology: Diabetic Wound/Ulcer of the Lower N/A N/A Extremity Comorbid History: Hypertension, Type II Diabetes, N/A N/A Osteoarthritis, Neuropathy, Received Chemotherapy, Received Radiation Date Acquired: 06/27/2022 N/A N/A Weeks of Treatment: 1 N/A N/A Wound Status: Open N/A N/A Wound Recurrence: No N/A N/A Measurements L x W x D (cm) 4.2x2x0.4 N/A N/A Area (cm) : 6.597 N/A N/A Volume (cm) : 2.639 N/A N/A % Reduction in Area: 0.00% N/A N/A % Reduction in Volume: 75.00% N/A N/A Classification: Unable to visualize wound bed N/A N/A Exudate Amount: Medium N/A N/A Exudate Type: Serous N/A N/A Exudate Color: amber N/A N/A Wound Margin: Thickened N/A N/A Granulation Amount: Small (1-33%) N/A N/A Granulation Quality: Red N/A N/A Necrotic Amount: Large (67-100%) N/A N/A Necrotic Tissue: Eschar, Adherent Slough N/A N/A Exposed Structures: Fat Layer (Subcutaneous Tissue): N/A  N/A Yes Fascia: No Tendon: No Muscle: No Joint: No Bone: No Treatment Notes Electronic Signature(s) Signed: 09/02/2022 11:41:41 AM By: Massie Kluver Entered By: Massie Kluver on 09/01/2022 15:35:18 [name], [name] ([id number]) [name], [name] ([id number]) -------------------------------------------------------------------------------- [place] Details Patient Name: [name], [name] B. Date of Service: 09/01/2022 3:00 PM Medical Record Number: [id number] Patient Account Number: [id number] Date of Birth/Sex: 06/07/1941 (81 y.o. F) Treating RN: Carlene Coria Primary Care Miski Feldpausch: [name] Other Clinician: Massie Kluver Referring Tegan Burnside: [name] Treating Aseem Sessums/Extender: Yaakov Guthrie in Treatment: 1 Active Inactive Necrotic Tissue Nursing Diagnoses: Impaired tissue integrity related to necrotic/devitalized tissue Knowledge deficit related to management of necrotic/devitalized tissue Goals: Necrotic/devitalized tissue will be  minimized in the wound bed Date Initiated: 08/25/2022 Target Resolution Date: 08/25/2022 Goal Status: Active Patient/caregiver will verbalize understanding of reason and process for debridement of necrotic tissue Date Initiated: 08/25/2022 Target Resolution Date: 08/25/2022 Goal Status: Active Interventions: Assess patient pain level pre-, during and post procedure and prior to discharge Provide education on necrotic tissue and debridement process Treatment Activities: Excisional debridement : 08/25/2022 Notes: Orientation to the Wound Care Program Nursing Diagnoses: Knowledge deficit related to the wound healing center program Goals: Patient/caregiver will verbalize understanding of the [place] Date Initiated: 08/25/2022 Target Resolution Date: 08/25/2022 Goal Status: Active Interventions: Provide education on orientation to the wound center Notes: Pain, Acute or Chronic Nursing  Diagnoses: Pain Management - Cyclic Acute (Dressing Change Related) Pain, acute or chronic: actual or potential Potential alteration in comfort, pain Goals: Patient will verbalize adequate pain control and receive pain control interventions during procedures as needed Date Initiated: 08/25/2022 Target Resolution Date: 08/25/2022 Goal Status: Active Patient/caregiver will verbalize adequate pain control between visits Date Initiated: 08/25/2022 Target Resolution Date: 08/25/2022 Goal Status: Active Patient/caregiver will verbalize comfort level met Date Initiated: 08/25/2022 Target Resolution Date: 08/25/2022 [name], [name] ([id number]) Goal Status: Active Interventions: Provide education on pain management Reposition patient for comfort Treatment Activities: Administer pain control measures as ordered : 08/25/2022 Notes: Peripheral Neuropathy Nursing Diagnoses: Knowledge deficit related to disease process and management of peripheral neurovascular dysfunction Potential alteration in peripheral tissue perfusion (select prior to confirmation of diagnosis) Goals: Patient/caregiver will verbalize understanding of disease process and disease management Date Initiated: 08/25/2022 Target Resolution Date: 08/25/2022 Goal Status: Active Interventions: Assess signs and symptoms of neuropathy upon admission and as needed Provide education on Management of Neuropathy and Related Ulcers Notes: Wound/Skin Impairment Nursing Diagnoses: Impaired tissue integrity Knowledge deficit related to smoking impact on wound healing Knowledge deficit related to ulceration/compromised skin integrity Goals: Patient/caregiver will verbalize understanding of skin care regimen Date Initiated: 08/25/2022 Target Resolution Date: 08/25/2022 Goal Status: Active Ulcer/skin breakdown will have a volume reduction of 30% by week 4 Date Initiated: 08/25/2022 Target Resolution Date: 09/22/2022 Goal Status:  Active Interventions: Assess ulceration(s) every visit Provide education on smoking Notes: Electronic Signature(s) Signed: 09/02/2022 11:41:41 AM By: Massie Kluver Signed: 09/03/2022 9:44:45 AM By: Carlene Coria RN Entered By: Massie Kluver on 09/01/2022 15:35:09 [name], [name] B. ([id number]) -------------------------------------------------------------------------------- Pain Assessment Details Patient Name: [name] Clipper B. Date of Service: 09/01/2022 3:00 PM Medical Record Number: [id number] Patient Account Number: [id number] Date of Birth/Sex: [birth year] (81 y.o. F) Treating RN: Carlene Coria Primary Care Farron Lafond: [name] Other Clinician: Massie Kluver Referring Itzamar Traynor: [name] Treating Christifer Chapdelaine/Extender: Yaakov Guthrie in Treatment: 1 Active Problems Location of Pain Severity and Description of Pain Patient Has Paino No Site Locations Pain Management and Medication Current Pain Management: Electronic Signature(s) Signed: 09/02/2022 11:41:41 AM By: Massie Kluver Signed: 09/03/2022 9:44:45 AM By: Carlene Coria RN Entered By: Massie Kluver on 09/01/2022 15:25:58 [name], [name] ([id number]) -------------------------------------------------------------------------------- Patient/Caregiver Education Details Patient Name: ARACELYS, GLADE B. Date of Service: 09/01/2022 3:00 PM Medical Record Number: [id number] Patient Account Number: [id number] Date of Birth/Gender: 1941-08-19 (81 y.o. F) Treating RN: Carlene Coria Primary Care Physician: [name] Other Clinician: Massie Kluver Referring Physician: [name] Treating Physician/Extender: Yaakov Guthrie in Treatment: 1 Education Assessment Education Provided To: Patient Education Topics Provided Wound/Skin Impairment: Handouts: Other: continue wound care as directed Methods: Explain/Verbal Responses: State content correctly Electronic Signature(s) Signed: 09/02/2022 11:41:41 AM By:  Massie Kluver Entered By: Massie Kluver  on 09/01/2022 17:14:59 [name], [name] ([id number]) -------------------------------------------------------------------------------- Wound Assessment Details Patient Name: [name], HACKERT B. Date of Service: 09/01/2022 3:00 PM Medical Record Number: [id number] Patient Account Number: [id number] Date of Birth/Sex: [birth year] (81 y.o. F) Treating RN: Carlene Coria Primary Care Bram Hottel: [name] Other Clinician: Massie Kluver Referring Sohum Delillo: [name] Treating Helio Lack/Extender: Yaakov Guthrie in Treatment: 1 Wound Status Wound Number: 1 Primary Diabetic Wound/Ulcer of the Lower Extremity Etiology: Wound Location: Right, Medial Lower Leg Wound Open Wounding Event: Trauma Status: Date Acquired: 06/27/2022 Comorbid Hypertension, Type II Diabetes, Osteoarthritis, Weeks Of Treatment: 1 History: Neuropathy, Received Chemotherapy, Received Radiation Clustered Wound: No Photos Wound Measurements Length: (cm) 4.2 Width: (cm) 2 Depth: (cm) 0.4 Area: (cm) 6.597 Volume: (cm) 2.639 % Reduction in Area: 0% % Reduction in Volume: 75% Wound Description Classification: Unable to visualize wound bed Wound Margin: Thickened Exudate Amount: Medium Exudate Type: Serous Exudate Color: amber Foul Odor After Cleansing: No Slough/Fibrino Yes Wound Bed Granulation Amount: Small (1-33%) Exposed Structure Granulation Quality: Red Fascia Exposed: No Necrotic Amount: Large (67-100%) Fat Layer (Subcutaneous Tissue) Exposed: Yes Necrotic Quality: Eschar, Adherent Slough Tendon Exposed: No Muscle Exposed: No Joint Exposed: No Bone Exposed: No Treatment Notes Wound #1 (Lower Leg) Wound Laterality: Right, Medial Cleanser Dakin 16 (oz) 0.25 Discharge Instruction: Use as directed. Peri-Wound Care MYKENZIE, EBANKS B. ([id number]) Topical Gentamicin Discharge Instruction: mix with Mupirocin and apply to wound bed Mupirocin  Ointment Discharge Instruction: Apply as directed by Ramata Strothman. Primary Dressing Hydrofera Blue Ready Transfer Foam, 4x5 (in/in) Discharge Instruction: Apply Hydrofera Blue Ready to wound bed as directed Hydrofera Blue Classic Foam Rope Dressing, 9x6 (mm/in) Discharge Instruction: lightly pack into wounds Secondary Dressing Zetuvit Plus 4x8 (in/in) Secured With Coban Cohesive Bandage 4x5 (yds) Stretched Discharge Instruction: Apply Coban as directed. Kerlix Roll Sterile or Non-Sterile 6-ply 4.5x4 (yd/yd) Discharge Instruction: Apply Kerlix as directed Compression Wrap Compression Stockings Add-Ons Electronic Signature(s) Signed: 09/02/2022 11:41:41 AM By: Massie Kluver Signed: 09/03/2022 9:44:45 AM By: Carlene Coria RN Entered By: Massie Kluver on 09/01/2022 15:33:35 [name], Sayde B. ([id number]) -------------------------------------------------------------------------------- Vitals Details Patient Name: [name] Clipper B. Date of Service: 09/01/2022 3:00 PM Medical Record Number: [id number] Patient Account Number: [id number] Date of Birth/Sex: [birth year] (81 y.o. F) Treating RN: Carlene Coria Primary Care Shawon Denzer: [name] Other Clinician: Massie Kluver Referring Mkayla Steele: [name] Treating Dezirea Mccollister/Extender: Yaakov Guthrie in Treatment: 1 Vital Signs Time Taken: 15:23 Temperature (F): 97.9 Height (in): 65 Pulse (bpm): 91 Weight (lbs): 172 Respiratory Rate (breaths/min): 18 Body Mass Index (BMI): 28.6 Blood Pressure (mmHg): 136/69 Reference Range: 80 - 120 mg / dl Electronic Signature(s) Signed: 09/02/2022 11:41:41 AM By: Massie Kluver Entered By: Massie Kluver on 09/01/2022 15:25:52

## 2022-09-03 NOTE — Progress Notes (Signed)
ADALYNNE, STEFFENSMEIER ([id number]) Visit Report for 09/01/2022 Chief Complaint Document Details Patient Name: NOURA, PURPURA. Date of Service: 09/01/2022 3:00 PM Medical Record Number: [id number] Patient Account Number: [id number] Date of Birth/Sex: 08-Apr-1941 (81 y.o. F) Treating RN: Carlene Coria Primary Care Provider: [name] Other Clinician: Massie Kluver Referring Provider: [name] Treating Provider/Extender: Yaakov Guthrie in Treatment: 1 Information Obtained from: Patient Chief Complaint 08/25/2022; right lower extremity wounds secondary to traumatic hematoma Electronic Signature(s) Signed: 09/01/2022 4:23:32 PM By: Kalman Shan DO Entered By: Kalman Shan on 09/01/2022 15:53:44 Ferris, [place] ([id number]) -------------------------------------------------------------------------------- Debridement Details Patient Name: [name] Date of Service: 09/01/2022 3:00 PM Medical Record Number: [id number] Patient Account Number: [id number] Date of Birth/Sex: [birth year] (81 y.o. F) Treating RN: Carlene Coria Primary Care Provider: [name] Other Clinician: Massie Kluver Referring Provider: [name] Treating Provider/Extender: Yaakov Guthrie in Treatment: 1 Debridement Performed for Wound #1 Right,Medial Lower Leg Assessment: Performed By: Physician Kalman Shan, MD Debridement Type: Debridement Severity of Tissue Pre Debridement: Muscle involvement without necrosis Level of Consciousness (Pre- Awake and Alert procedure): Pre-procedure Verification/Time Out Yes - 15:40 Taken: Start Time: 15:40 Total Area Debrided (L x W): 4.2 (cm) x 2 (cm) = 8.4 (cm) Tissue and other material Viable, Non-Viable, Slough, Subcutaneous, Slough debrided: Level: Skin/Subcutaneous Tissue Debridement Description: Excisional Instrument: Curette Bleeding: Minimum Hemostasis Achieved: Pressure End Time: 15:43 Response to Treatment: Procedure  was tolerated well Level of Consciousness (Post- Awake and Alert procedure): Post Debridement Measurements of Total Wound Length: (cm) 4.2 Width: (cm) 2 Depth: (cm) 0.6 Volume: (cm) 3.958 Character of Wound/Ulcer Post Debridement: Stable Severity of Tissue Post Debridement: Muscle involvement without necrosis Post Procedure Diagnosis Same as Pre-procedure Electronic Signature(s) Signed: 09/01/2022 4:23:32 PM By: Kalman Shan DO Signed: 09/02/2022 11:41:41 AM By: Massie Kluver Signed: 09/03/2022 9:44:45 AM By: Carlene Coria RN Entered By: Massie Kluver on 09/01/2022 15:44:08 Wessells, Yukie B. ([id number]) -------------------------------------------------------------------------------- HPI Details Patient Name: [name] Date of Service: 09/01/2022 3:00 PM Medical Record Number: [id number] Patient Account Number: [id number] Date of Birth/Sex: 1941-08-25 (81 y.o. F) Treating RN: Carlene Coria Primary Care Provider: [name] Other Clinician: Massie Kluver Referring Provider: [name] Treating Provider/Extender: Yaakov Guthrie in Treatment: 1 History of Present Illness HPI Description: Admission 08/25/2022 Ms. Sonyia Muro is an 81 year old female with a past medical history of breast cancer, with insulin dependent, controlled type 2 diabetes, chronic venous insufficiency that presents to the clinic for a 38-monthhistory of nonhealing ulcer to the right lower extremity. She states she hit her leg against a stair and developed a hematoma. The area was evacuated by her primary care physician. She has tried Neosporin and wet-to- dry dressings. She reports mild chronic pain to the area. She denies increased warmth, erythema or purulent drainage. 9/20; patient presents for follow-up. She had an x-ray of the right leg done at last clinic visit that did not show Evidence of osteomyelitis. She has been doing Dakin's wet-to-dry dressings without issues. She denies  signs of infection. Electronic Signature(s) Signed: 09/01/2022 4:23:32 PM By: HKalman ShanDO Entered By: HKalman Shanon 09/01/2022 15:55:17 Parzych, TCoralee Pesa([id number] -------------------------------------------------------------------------------- Physical Exam Details Patient Name: ERAECHELL, SINGLETONB. Date of Service: 09/01/2022 3:00 PM Medical Record Number: [id number] Account Number: [id number] of Birth/Sex: 611-17-1942((82y.o. F) Treating RN: ECarlene CoriaPrimary Care Provider: ZMia CreekOther Clinician: VMassie KluverReferring Provider: ZMia CreekTreating Provider/Extender: HYaakov Guthriein Treatment: 1 Constitutional . Cardiovascular . Psychiatric .  Notes Right lower extremity: To the distal medial aspect there is an open wound with granulation tissue and nonviable tissue. No surrounding soft tissue infection including increased warmth, erythema or purulent drainage. Venous stasis dermatitis. 2+ pitting edema to the knee. Electronic Signature(s) Signed: 09/01/2022 4:23:32 PM By: Kalman Shan DO Entered By: Kalman Shan on 09/01/2022 15:55:49 Dragone, Coralee Pesa ([id number]) -------------------------------------------------------------------------------- Physician Orders Details Patient Name: [name] Date of Service: 09/01/2022 3:00 PM Medical Record Number: [id number] Patient Account Number: [id number] Date of Birth/Sex: July 21, 1941 (81 y.o. F) Treating RN: Carlene Coria Primary Care Provider: [name] Other Clinician: Massie Kluver Referring Provider: [name] Treating Provider/Extender: Yaakov Guthrie in Treatment: 1 Verbal / Phone Orders: No Diagnosis Coding Follow-up Appointments Wound #1 Right,Medial Lower Leg o Return Appointment in 1 week. Bathing/ Shower/ Hygiene o May shower with wound dressing protected with water repellent cover or cast protector. o No tub bath. Edema Control -  Lymphedema / Segmental Compressive Device / Other o Other: - kerlix and coban right lower leg Wound Treatment Wound #1 - Lower Leg Wound Laterality: Right, Medial Cleanser: Dakin 16 (oz) 0.25 1 x Per Week/30 Days Discharge Instructions: Use as directed. Topical: Gentamicin 1 x Per Week/30 Days Discharge Instructions: mix with Mupirocin and apply to wound bed Topical: Mupirocin Ointment 1 x Per Week/30 Days Discharge Instructions: Apply as directed by provider. Primary Dressing: Hydrofera Blue Ready Transfer Foam, 4x5 (in/in) 1 x Per Week/30 Days Discharge Instructions: Apply Hydrofera Blue Ready to wound bed as directed Primary Dressing: Hydrofera Blue Classic Foam Rope Dressing, 9x6 (mm/in) 1 x Per Week/30 Days Discharge Instructions: lightly pack into wounds Secondary Dressing: Zetuvit Plus 4x8 (in/in) 1 x Per Week/30 Days Secured With: Coban Cohesive Bandage 4x5 (yds) Stretched 1 x Per Week/30 Days Discharge Instructions: Apply Coban as directed. Secured With: [employer] or Non-Sterile 6-ply 4.5x4 (yd/yd) 1 x Per Week/30 Days Discharge Instructions: Apply Kerlix as directed Electronic Signature(s) Signed: 09/01/2022 4:23:32 PM By: Kalman Shan DO Entered By: Kalman Shan on 09/01/2022 15:57:10 Cuoco, Alisson B. ([id number]) -------------------------------------------------------------------------------- Problem List Details Patient Name: RANELL, FINELLI B. Date of Service: 09/01/2022 3:00 PM Medical Record Number: [id number] Patient Account Number: [id number] Date of Birth/Sex: [birth year] (81 y.o. F) Treating RN: Carlene Coria Primary Care Provider: [name] Other Clinician: Massie Kluver Referring Provider: [name] Treating Provider/Extender: Yaakov Guthrie in Treatment: 1 Active Problems ICD-10 Encounter Code Description Active Date MDM Diagnosis L97.818 Non-pressure chronic ulcer of other part of right lower leg with other 08/25/2022 No  Yes specified severity T79.8XXA Other early complications of trauma, initial encounter 08/25/2022 No Yes T79.2XXA Traumatic secondary and recurrent hemorrhage and seroma, initial 08/25/2022 No Yes encounter E11.622 Type 2 diabetes mellitus with other skin ulcer 08/25/2022 No Yes E03.9 Hypothyroidism, unspecified 08/25/2022 No Yes I10 Essential (primary) hypertension 08/25/2022 No Yes I87.311 Chronic venous hypertension (idiopathic) with ulcer of right lower 08/25/2022 No Yes extremity Inactive Problems Resolved Problems Electronic Signature(s) Signed: 09/01/2022 4:23:32 PM By: Kalman Shan DO Entered By: Kalman Shan on 09/01/2022 15:53:35 Foos, Rosela B. ([id number]) -------------------------------------------------------------------------------- Progress Note Details Patient Name: [name] Date of Service: 09/01/2022 3:00 PM Medical Record Number: [id number] Patient Account Number: [id number] Date of Birth/Sex: 23-Dec-1940 (82 y.o. F) Treating RN: Carlene Coria Primary Care Provider: [name] Other Clinician: Massie Kluver Referring Provider: [name] Treating Provider/Extender: Yaakov Guthrie in Treatment: 1 Subjective Chief Complaint Information obtained from Patient 08/25/2022; right lower extremity wounds secondary to traumatic hematoma History of Present  Illness (HPI) Admission 08/25/2022 Ms. Ada Holness is an 81 year old female with a past medical history of breast cancer, with insulin dependent, controlled type 2 diabetes, chronic venous insufficiency that presents to the clinic for a 37-monthhistory of nonhealing ulcer to the right lower extremity. She states she hit her leg against a stair and developed a hematoma. The area was evacuated by her primary care physician. She has tried Neosporin and wet-to- dry dressings. She reports mild chronic pain to the area. She denies increased warmth, erythema or purulent drainage. 9/20; patient presents  for follow-up. She had an x-ray of the right leg done at last clinic visit that did not show Evidence of osteomyelitis. She has been doing Dakin's wet-to-dry dressings without issues. She denies signs of infection. Objective Constitutional Vitals Time Taken: 3:23 PM, Height: 65 in, Weight: 172 lbs, BMI: 28.6, Temperature: 97.9 F, Pulse: 91 bpm, Respiratory Rate: 18 breaths/min, Blood Pressure: 136/69 mmHg. General Notes: Right lower extremity: To the distal medial aspect there is an open wound with granulation tissue and nonviable tissue. No surrounding soft tissue infection including increased warmth, erythema or purulent drainage. Venous stasis dermatitis. 2+ pitting edema to the knee. Integumentary (Hair, Skin) Wound #1 status is Open. Original cause of wound was Trauma. The date acquired was: 06/27/2022. The wound has been in treatment 1 weeks. The wound is located on the Right,Medial Lower Leg. The wound measures 4.2cm length x 2cm width x 0.4cm depth; 6.597cm^2 area and 2.639cm^3 volume. There is Fat Layer (Subcutaneous Tissue) exposed. There is a medium amount of serous drainage noted. The wound margin is thickened. There is small (1-33%) red granulation within the wound bed. There is a large (67-100%) amount of necrotic tissue within the wound bed including Eschar and Adherent Slough. Assessment Active Problems ICD-10 Non-pressure chronic ulcer of other part of right lower leg with other specified severity Other early complications of trauma, initial encounter Traumatic secondary and recurrent hemorrhage and seroma, initial encounter Type 2 diabetes mellitus with other skin ulcer Hypothyroidism, unspecified Essential (primary) hypertension Chronic venous hypertension (idiopathic) with ulcer of right lower extremity Levit, Bentli B. ([id number] Patient's wound is stable. No radiographic evidence of osteomyelitis. No signs of infection on exam. I debrided nonviable tissue. At this  time I think she would benefit from a compression wrap as she has evidence of venous insufficiency on exam. We will also add antibiotic ointment and Hydrofera Blue under the wrap. Procedures Wound #1 Pre-procedure diagnosis of Wound #1 is a Diabetic Wound/Ulcer of the Lower Extremity located on the Right,Medial Lower Leg .Severity of Tissue Pre Debridement is: Muscle involvement without necrosis. There was a Excisional Skin/Subcutaneous Tissue Debridement with a total area of 8.4 sq cm performed by HKalman Shan MD. With the following instrument(s): Curette to remove Viable and Non-Viable tissue/material. Material removed includes Subcutaneous Tissue and Slough and. A time out was conducted at 15:40, prior to the start of the procedure. A Minimum amount of bleeding was controlled with Pressure. The procedure was tolerated well. Post Debridement Measurements: 4.2cm length x 2cm width x 0.6cm depth; 3.958cm^3 volume. Character of Wound/Ulcer Post Debridement is stable. Severity of Tissue Post Debridement is: Muscle involvement without necrosis. Post procedure Diagnosis Wound #1: Same as Pre-Procedure Plan Follow-up Appointments: Wound #1 Right,Medial Lower Leg: Return Appointment in 1 week. Bathing/ Shower/ Hygiene: May shower with wound dressing protected with water repellent cover or cast protector. No tub bath. Edema Control - Lymphedema / Segmental Compressive Device / Other: Other: - kerlix  and coban right lower leg WOUND #1: - Lower Leg Wound Laterality: Right, Medial Cleanser: Dakin 16 (oz) 0.25 1 x Per Week/30 Days Discharge Instructions: Use as directed. Topical: Gentamicin 1 x Per Week/30 Days Discharge Instructions: mix with Mupirocin and apply to wound bed Topical: Mupirocin Ointment 1 x Per Week/30 Days Discharge Instructions: Apply as directed by provider. Primary Dressing: Hydrofera Blue Ready Transfer Foam, 4x5 (in/in) 1 x Per Week/30 Days Discharge Instructions:  Apply Hydrofera Blue Ready to wound bed as directed Primary Dressing: Hydrofera Blue Classic Foam Rope Dressing, 9x6 (mm/in) 1 x Per Week/30 Days Discharge Instructions: lightly pack into wounds Secondary Dressing: Zetuvit Plus 4x8 (in/in) 1 x Per Week/30 Days Secured With: Coban Cohesive Bandage 4x5 (yds) Stretched 1 x Per Week/30 Days Discharge Instructions: Apply Coban as directed. Secured With: [employer] or Non-Sterile 6-ply 4.5x4 (yd/yd) 1 x Per Week/30 Days Discharge Instructions: Apply Kerlix as directed 1. In office sharp debridement 2. Gentamicin/mupirocin with Hydrofera Blue under Kerlix/Coban 3. Follow-up in 1 week Electronic Signature(s) Signed: 09/01/2022 4:23:32 PM By: Kalman Shan DO Entered By: Kalman Shan on 09/01/2022 15:56:43 Leisure, Coralee Pesa ([id number]) -------------------------------------------------------------------------------- ROS/PFSH Details Patient Name: [name] Date of Service: 09/01/2022 3:00 PM Medical Record Number: [id number] Patient Account Number: [id number] Date of Birth/Sex: 23-May-1941 (81 y.o. F) Treating RN: Carlene Coria Primary Care Provider: [name] Other Clinician: Massie Kluver Referring Provider: [name] Treating Provider/Extender: Yaakov Guthrie in Treatment: 1 Cardiovascular Medical History: Positive for: Hypertension Endocrine Medical History: Positive for: Type II Diabetes Time with diabetes: 40+ Treated with: Insulin Blood sugar tested every day: Yes Tested : 67 Musculoskeletal Medical History: Positive for: Osteoarthritis - Shoulders Neurologic Medical History: Positive for: Neuropathy Oncologic Medical History: Positive for: Received Chemotherapy; Received Radiation Immunizations Pneumococcal Vaccine: Received Pneumococcal Vaccination: Yes Received Pneumococcal Vaccination On or After 60th Birthday: Yes Implantable Devices None Family and Social History Never  smoker; Marital Status - Married; Alcohol Use: Never; Drug Use: No History; Caffeine Use: Daily Electronic Signature(s) Signed: 09/01/2022 4:23:32 PM By: Kalman Shan DO Signed: 09/03/2022 9:44:45 AM By: Carlene Coria RN Entered By: Kalman Shan on 09/01/2022 15:57:22 Klima, Jordy BMarland Kitchen ([id number]) -------------------------------------------------------------------------------- SuperBill Details Patient Name: [name] Date of Service: 09/01/2022 Medical Record Number: [id number] Patient Account Number: [id number] Date of Birth/Sex: [birth year] (81 y.o. F) Treating RN: Carlene Coria Primary Care Provider: [name] Other Clinician: Massie Kluver Referring Provider: [name] Treating Provider/Extender: Yaakov Guthrie in Treatment: 1 Diagnosis Coding ICD-10 Codes Code Description [phone] Non-pressure chronic ulcer of other part of right lower leg with other specified severity T79.8XXA Other early complications of trauma, initial encounter T37.2XXA Traumatic secondary and recurrent hemorrhage and seroma, initial encounter E11.622 Type 2 diabetes mellitus with other skin ulcer E03.9 Hypothyroidism, unspecified I10 Essential (primary) hypertension I87.311 Chronic venous hypertension (idiopathic) with ulcer of right lower extremity Facility Procedures CPT4 Code: [id number] Description: 76720 - DEB SUBQ TISSUE 20 SQ CM/< Modifier: Quantity: 1 CPT4 Code: Description: ICD-10 Diagnosis Description L97.818 Non-pressure chronic ulcer of other part of right lower leg with other spec Modifier: ified severity Quantity: Physician Procedures CPT4 Code: 9470962 Description: 11042 - WC PHYS SUBQ TISS 20 SQ CM Modifier: Quantity: 1 CPT4 Code: Description: ICD-10 Diagnosis Description L97.818 Non-pressure chronic ulcer of other part of right lower leg with other spec Modifier: ified severity Quantity: Electronic Signature(s) Signed: 09/01/2022 4:23:32 PM By: Kalman Shan DO Entered By: Kalman Shan on 09/01/2022 15:57:03

## 2022-09-08 ENCOUNTER — Encounter (HOSPITAL_BASED_OUTPATIENT_CLINIC_OR_DEPARTMENT_OTHER): Payer: Medicare PPO | Admitting: Internal Medicine

## 2022-09-08 DIAGNOSIS — E11622 Type 2 diabetes mellitus with other skin ulcer: Secondary | ICD-10-CM | POA: Diagnosis not present

## 2022-09-08 DIAGNOSIS — L97818 Non-pressure chronic ulcer of other part of right lower leg with other specified severity: Secondary | ICD-10-CM | POA: Diagnosis not present

## 2022-09-10 NOTE — Progress Notes (Signed)
[name], [name] ([id number]) Visit Report for 09/08/2022 Chief Complaint Document Details Patient Name: [name], [name]. Date of Service: 09/08/2022 2:15 PM Medical Record Number: [id number] Patient Account Number: [id number] Date of Birth/Sex: 22-Feb-1941 (81 y.o. F) Treating RN: Cornell Barman Primary Care Provider: [name] Other Clinician: Massie Kluver Referring Provider: [name] Treating Provider/Extender: Yaakov Guthrie in Treatment: 2 Information Obtained from: Patient Chief Complaint 08/25/2022; right lower extremity wounds secondary to traumatic hematoma Electronic Signature(s) Signed: 09/08/2022 3:59:15 PM By: Kalman Shan DO Entered By: Kalman Shan on 09/08/2022 14:52:55 [place], [place] ([id number]) -------------------------------------------------------------------------------- Debridement Details Patient Name: [name] Clipper B. Date of Service: 09/08/2022 2:15 PM Medical Record Number: [id number] Patient Account Number: [id number] Date of Birth/Sex: 1941/01/22 (81 y.o. F) Treating RN: Cornell Barman Primary Care Provider: [name] Other Clinician: Massie Kluver Referring Provider: [name] Treating Provider/Extender: Yaakov Guthrie in Treatment: 2 Debridement Performed for Wound #1 Right,Medial Lower Leg Assessment: Performed By: Physician Kalman Shan, MD Debridement Type: Debridement Severity of Tissue Pre Debridement: Muscle involvement without necrosis Level of Consciousness (Pre- Awake and Alert procedure): Pre-procedure Verification/Time Out Yes - 12:43 Taken: Start Time: 12:43 Total Area Debrided (L x W): 3.9 (cm) x 1.8 (cm) = 7.02 (cm) Tissue and other material Viable, Non-Viable, Slough, Subcutaneous, Slough debrided: Level: Skin/Subcutaneous Tissue Debridement Description: Excisional Instrument: Curette Bleeding: Minimum Hemostasis Achieved: Pressure End Time: 14:46 Response to Treatment: Procedure  was tolerated well Level of Consciousness (Post- Awake and Alert procedure): Post Debridement Measurements of Total Wound Length: (cm) 3.9 Width: (cm) 1.8 Depth: (cm) 2.1 Volume: (cm) 11.578 Character of Wound/Ulcer Post Debridement: Stable Severity of Tissue Post Debridement: Muscle involvement without necrosis Post Procedure Diagnosis Same as Pre-procedure Electronic Signature(s) Signed: 09/08/2022 3:59:15 PM By: Kalman Shan DO Signed: 09/08/2022 5:32:28 PM By: Gretta Cool, BSN, RN, CWS, Kim RN, BSN Signed: 09/10/2022 12:10:36 PM By: Massie Kluver Entered By: Massie Kluver on 09/08/2022 14:46:53 [place], [place]. ([id number]) -------------------------------------------------------------------------------- HPI Details Patient Name: [name] B. Date of Service: 09/08/2022 2:15 PM Medical Record Number: [id number] Patient Account Number: [id number] Date of Birth/Sex: [birth year] (81 y.o. F) Treating RN: Cornell Barman Primary Care Provider: [name] Other Clinician: Massie Kluver Referring Provider: [name] Treating Provider/Extender: Yaakov Guthrie in Treatment: 2 History of Present Illness HPI Description: Admission 08/25/2022 [name] is an 81 year old female with a past medical history of breast cancer, with insulin dependent, controlled type 2 diabetes, chronic venous insufficiency that presents to the clinic for a 49-monthhistory of nonhealing ulcer to the right lower extremity. She states she hit her leg against a stair and developed a hematoma. The area was evacuated by her primary care physician. She has tried Neosporin and wet-to- dry dressings. She reports mild chronic pain to the area. She denies increased warmth, erythema or purulent drainage. 9/20; patient presents for follow-up. She had an x-ray of the right leg done at last clinic visit that did not show Evidence of osteomyelitis. She has been doing Dakin's wet-to-dry dressings without  issues. She denies signs of infection. 9/27; patient presents for follow-up. Hydrofera Blue with antibiotic ointment was used under Kerlix/Coban at last clinic visit. She tolerated this well. She has no issues or complaints today. She denies signs of infection. Electronic Signature(s) Signed: 09/08/2022 3:59:15 PM By: HKalman ShanDO Entered By: HKalman Shanon 09/08/2022 14:53:23 [name]([id number] -------------------------------------------------------------------------------- Physical Exam Details Patient Name: [name], [name] Date of Service: 09/08/2022 2:15 PM Medical Record Number: [id number] Account Number:  [id number] Date of Birth/Sex: [birth year] (81 y.o. F) Treating RN: Cornell Barman Primary Care Provider: [name] Other Clinician: Massie Kluver Referring Provider: [name] Treating Provider/Extender: Yaakov Guthrie in Treatment: 2 Constitutional . Cardiovascular . Psychiatric . Notes Right lower extremity: To the distal medial aspect there is an open wound with granulation tissue and nonviable tissue With 2 tunnels of increased depth. No surrounding soft tissue infection including increased warmth, erythema or purulent drainage. Venous stasis dermatitis. 2+ pitting edema to the knee. Electronic Signature(s) Signed: 09/08/2022 3:59:15 PM By: Kalman Shan DO Entered By: Kalman Shan on 09/08/2022 14:54:17 Bentson, [name] ([id number]) -------------------------------------------------------------------------------- Physician Orders Details Patient Name: [name] Clipper B. Date of Service: 09/08/2022 2:15 PM Medical Record Number: [id number] Patient Account Number: [id number] Date of Birth/Sex: [birth year] (81 y.o. F) Treating RN: Cornell Barman Primary Care Provider: [name] Other Clinician: Massie Kluver Referring Provider: [name] Treating Provider/Extender: Yaakov Guthrie in Treatment: 2 Verbal /  Phone Orders: No Diagnosis Coding Follow-up Appointments Wound #1 Right,Medial Lower Leg o Return Appointment in 1 week. Bathing/ Shower/ Hygiene o May shower with wound dressing protected with water repellent cover or cast protector. o No tub bath. Edema Control - Lymphedema / Segmental Compressive Device / Other o Tubigrip single layer applied. - Tubi D o Other: - kerlix and coban right lower leg Wound Treatment Wound #1 - Lower Leg Wound Laterality: Right, Medial Cleanser: Dakin 16 (oz) 0.25 1 x Per Week/30 Days Discharge Instructions: Use as directed. Topical: Gentamicin 1 x Per Week/30 Days Discharge Instructions: mix with Mupirocin and apply to wound bed Topical: Mupirocin Ointment 1 x Per Week/30 Days Discharge Instructions: Apply as directed by provider. Primary Dressing: Hydrofera Blue Ready Transfer Foam, 4x5 (in/in) 1 x Per Week/30 Days Discharge Instructions: Apply Hydrofera Blue Ready to wound bed as directed Primary Dressing: Hydrofera Blue Classic Foam Rope Dressing, 9x6 (mm/in) 1 x Per Week/30 Days Discharge Instructions: lightly pack into wounds Secondary Dressing: Zetuvit Plus 4x8 (in/in) 1 x Per Week/30 Days Secured With: Coban Cohesive Bandage 4x5 (yds) Stretched 1 x Per Week/30 Days Discharge Instructions: Apply Coban as directed. Secured With: [employer] or Non-Sterile 6-ply 4.5x4 (yd/yd) 1 x Per Week/30 Days Discharge Instructions: Apply Kerlix as directed Electronic Signature(s) Signed: 09/08/2022 3:59:15 PM By: Kalman Shan DO Entered By: Kalman Shan on 09/08/2022 14:55:49 [place], [name] B. ([id number]) -------------------------------------------------------------------------------- Problem List Details Patient Name: LANIYAH, ROSENWALD B. Date of Service: 09/08/2022 2:15 PM Medical Record Number: [id number] Patient Account Number: [id number] Date of Birth/Sex: [birth year] (81 y.o. F) Treating RN: Cornell Barman Primary Care Provider:  [name] Other Clinician: Massie Kluver Referring Provider: [name] Treating Provider/Extender: Yaakov Guthrie in Treatment: 2 Active Problems ICD-10 Encounter Code Description Active Date MDM Diagnosis L97.818 Non-pressure chronic ulcer of other part of right lower leg with other 08/25/2022 No Yes specified severity T79.8XXA Other early complications of trauma, initial encounter 08/25/2022 No Yes T79.2XXA Traumatic secondary and recurrent hemorrhage and seroma, initial 08/25/2022 No Yes encounter E11.622 Type 2 diabetes mellitus with other skin ulcer 08/25/2022 No Yes E03.9 Hypothyroidism, unspecified 08/25/2022 No Yes I10 Essential (primary) hypertension 08/25/2022 No Yes I87.311 Chronic venous hypertension (idiopathic) with ulcer of right lower 08/25/2022 No Yes extremity Inactive Problems Resolved Problems Electronic Signature(s) Signed: 09/08/2022 3:59:15 PM By: Kalman Shan DO Entered By: Kalman Shan on 09/08/2022 14:52:51 [place], [name] B. ([id number]) -------------------------------------------------------------------------------- Progress Note Details Patient Name: [name] Clipper B. Date of Service: 09/08/2022 2:15 PM Medical Record Number: [id number] Patient Account  Number: [id number] Date of Birth/Sex: Jul 07, 1941 (81 y.o. F) Treating RN: Cornell Barman Primary Care Provider: [name] Other Clinician: Massie Kluver Referring Provider: [name] Treating Provider/Extender: Yaakov Guthrie in Treatment: 2 Subjective Chief Complaint Information obtained from Patient 08/25/2022; right lower extremity wounds secondary to traumatic hematoma History of Present Illness (HPI) Admission 08/25/2022 Ms. Calleen Alvis is an 81 year old female with a past medical history of breast cancer, with insulin dependent, controlled type 2 diabetes, chronic venous insufficiency that presents to the clinic for a 53-monthhistory of nonhealing ulcer to the  right lower extremity. She states she hit her leg against a stair and developed a hematoma. The area was evacuated by her primary care physician. She has tried Neosporin and wet-to- dry dressings. She reports mild chronic pain to the area. She denies increased warmth, erythema or purulent drainage. 9/20; patient presents for follow-up. She had an x-ray of the right leg done at last clinic visit that did not show Evidence of osteomyelitis. She has been doing Dakin's wet-to-dry dressings without issues. She denies signs of infection. 9/27; patient presents for follow-up. Hydrofera Blue with antibiotic ointment was used under Kerlix/Coban at last clinic visit. She tolerated this well. She has no issues or complaints today. She denies signs of infection. Objective Constitutional Vitals Time Taken: 2:26 PM, Height: 65 in, Weight: 172 lbs, BMI: 28.6, Temperature: 98.3 F, Pulse: 84 bpm, Respiratory Rate: 18 breaths/min, Blood Pressure: 175/82 mmHg. General Notes: Right lower extremity: To the distal medial aspect there is an open wound with granulation tissue and nonviable tissue With 2 tunnels of increased depth. No surrounding soft tissue infection including increased warmth, erythema or purulent drainage. Venous stasis dermatitis. 2+ pitting edema to the knee. Integumentary (Hair, Skin) Wound #1 status is Open. Original cause of wound was Trauma. The date acquired was: 06/27/2022. The wound has been in treatment 2 weeks. The wound is located on the Right,Medial Lower Leg. The wound measures 3.9cm length x 1.8cm width x 2.1cm depth; 5.513cm^2 area and 11.578cm^3 volume. There is Fat Layer (Subcutaneous Tissue) exposed. There is a medium amount of serous drainage noted. The wound margin is thickened. There is small (1-33%) red granulation within the wound bed. There is a large (67-100%) amount of necrotic tissue within the wound bed including Eschar and Adherent Slough. 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 ([id number] Patient's wound has shown improvement in size and appearance since last clinic visit. I debrided nonviable tissue. I recommended continuing the course with antibiotic ointment and Hydrofera Blue and Hydrofera Blue rope to the tunnels under Kerlix/Coban. Follow-up in 1 week. Procedures Wound #1 Pre-procedure diagnosis of Wound #1 is a Diabetic Wound/Ulcer of the Lower Extremity located on the Right,Medial Lower Leg .Severity of Tissue Pre Debridement is: Muscle involvement without necrosis. There was a Excisional Skin/Subcutaneous Tissue Debridement with a total area of 7.02 sq cm performed by HKalman Shan MD. With the following instrument(s): Curette to remove Viable and Non-Viable tissue/material. Material removed includes Subcutaneous Tissue and Slough and. A time out was conducted at 12:43, prior to the start of the procedure. A Minimum amount of bleeding was controlled with Pressure. The procedure was tolerated well. Post  Debridement Measurements: 3.9cm length x 1.8cm width x 2.1cm depth; 11.578cm^3 volume. Character of Wound/Ulcer Post Debridement is stable. Severity of Tissue Post Debridement is: Muscle involvement without necrosis. Post procedure Diagnosis Wound #1: Same as Pre-Procedure Plan Follow-up Appointments: Wound #1 Right,Medial Lower Leg: Return Appointment in 1 week. Bathing/ Shower/ Hygiene: May shower with wound dressing protected with water repellent cover or cast protector. No tub bath. Edema Control - Lymphedema /  Segmental Compressive Device / Other: Tubigrip single layer applied. - Tubi D Other: - kerlix and coban right lower leg WOUND #1: - Lower Leg Wound Laterality: Right, Medial Cleanser: Dakin 16 (oz) 0.25 1 x Per Week/30 Days Discharge Instructions: Use as directed. Topical: Gentamicin 1 x Per Week/30 Days Discharge Instructions: mix with Mupirocin and apply to wound bed Topical: Mupirocin Ointment 1 x Per Week/30 Days Discharge Instructions: Apply as directed by provider. Primary Dressing: Hydrofera Blue Ready Transfer Foam, 4x5 (in/in) 1 x Per Week/30 Days Discharge Instructions: Apply Hydrofera Blue Ready to wound bed as directed Primary Dressing: Hydrofera Blue Classic Foam Rope Dressing, 9x6 (mm/in) 1 x Per Week/30 Days Discharge Instructions: lightly pack into wounds Secondary Dressing: Zetuvit Plus 4x8 (in/in) 1 x Per Week/30 Days Secured With: Coban Cohesive Bandage 4x5 (yds) Stretched 1 x Per Week/30 Days Discharge Instructions: Apply Coban as directed. Secured With: [employer] or Non-Sterile 6-ply 4.5x4 (yd/yd) 1 x Per Week/30 Days Discharge Instructions: Apply Kerlix as directed 1. In office sharp debridement 2. Hydrofera Blue, Hydrofera Blue rope, mupirocin/gentamicin 3. Kerlix/Coban 4. Follow-up in 1 week Electronic Signature(s) Signed: 09/08/2022 3:59:15 PM By: Kalman Shan DO Entered By: Kalman Shan on 09/08/2022 14:55:26 [place], [name] ([id number]) -------------------------------------------------------------------------------- ROS/PFSH Details Patient Name: [name] Clipper B. Date of Service: 09/08/2022 2:15 PM Medical Record Number: [id number] Patient Account Number: [id number] Date of Birth/Sex: 05-03-1941 (81 y.o. F) Treating RN: Cornell Barman Primary Care Provider: [name] Other Clinician: Massie Kluver Referring Provider: [name] Treating Provider/Extender: Yaakov Guthrie in Treatment: 2 Cardiovascular Medical  History: Positive for: Hypertension Endocrine Medical History: Positive for: Type II Diabetes Time with diabetes: 40+ Treated with: Insulin Blood sugar tested every day: Yes Tested : 67 Musculoskeletal Medical History: Positive for: Osteoarthritis - Shoulders Neurologic Medical History: Positive for: Neuropathy Oncologic Medical History: Positive for: Received Chemotherapy; Received Radiation Immunizations Pneumococcal Vaccine: Received Pneumococcal Vaccination: Yes Received Pneumococcal Vaccination On or After 60th Birthday: Yes Implantable Devices None Family and Social History Never smoker; Marital Status - Married; Alcohol Use: Never; Drug Use: No History; Caffeine Use: Daily Electronic Signature(s) Signed: 09/08/2022 3:59:15 PM By: Kalman Shan DO Signed: 09/08/2022 5:32:28 PM By: Gretta Cool, BSN, RN, CWS, Kim RN, BSN Entered By: Kalman Shan on 09/08/2022 14:55:57 [place], [name] ([id number]) -------------------------------------------------------------------------------- [place] Details Patient Name: EBELYN, BOHNET B. Date of Service: 09/08/2022 Medical Record Number: [id number] Patient Account Number: [id number] Date of Birth/Sex: 04/30/1941 (81 y.o. F) Treating RN: Cornell Barman Primary Care Provider: [name] Other Clinician: Massie Kluver Referring Provider: [name] Treating Provider/Extender: Yaakov Guthrie in Treatment: 2 Diagnosis Coding ICD-10 Codes Code Description [phone] Non-pressure chronic ulcer of other part of right lower leg with other specified severity T79.8XXA Other early complications of trauma, initial encounter T35.2XXA Traumatic secondary and recurrent hemorrhage and seroma, initial encounter E11.622 Type 2 diabetes mellitus with other skin ulcer E03.9 Hypothyroidism, unspecified I10 Essential (primary) hypertension I87.311 Chronic venous hypertension (idiopathic) with ulcer of right lower extremity Facility  Procedures CPT4 Code: [id number] Description: 09381 - DEB SUBQ TISSUE 20 SQ  CM/< Modifier: Quantity: 1 CPT4 Code: Description: ICD-10 Diagnosis Description L97.818 Non-pressure chronic ulcer of other part of right lower leg with other spec Modifier: ified severity Quantity: Physician Procedures CPT4 Code: [id number] Description: 09198 - WC PHYS SUBQ TISS 20 SQ CM Modifier: Quantity: 1 CPT4 Code: Description: ICD-10 Diagnosis Description L97.818 Non-pressure chronic ulcer of other part of right lower leg with other spec Modifier: ified severity Quantity: Electronic Signature(s) Signed: 09/08/2022 3:59:15 PM By: Kalman Shan DO Entered By: Kalman Shan on 09/08/2022 14:55:40

## 2022-09-10 NOTE — Progress Notes (Signed)
SHARETTA, RICCHIO ([id number]) Visit Report for 09/08/2022 Arrival Information Details Patient Name: [name]. Date of Service: 09/08/2022 2:15 PM Medical Record Number: [id number] Patient Account Number: [id number] Date of Birth/Sex: 1941/01/03 (81 y.o. F) Treating RN: Cornell Barman Primary Care Theadora Noyes: [name] Other Clinician: Massie Kluver Referring Tesean Stump: [name] Treating Loletta Harper/Extender: Yaakov Guthrie in Treatment: 2 Visit Information History Since Last Visit All ordered tests and consults were completed: No Patient Arrived: Ambulatory Added or deleted any medications: No Arrival Time: 14:19 Any new allergies or adverse reactions: No Transfer Assistance: None Had a fall or experienced change in No Patient Requires Transmission-Based No activities of daily living that may affect Precautions: risk of falls: Patient Has Alerts: Yes Hospitalized since last visit: No Patient Alerts: Patient on Blood Pain Present Now: No Thinner 44m aspirin Type II Diabetic Electronic Signature(s) Signed: 09/10/2022 12:10:36 PM By: VMassie KluverEntered By: VMassie Kluveron 09/08/2022 14:26:05 Clinch, Tawonna B. ([id number] -------------------------------------------------------------------------------- Clinic Level of Care Assessment Details Patient Name: [name] ClipperB. Date of Service: 09/08/2022 2:15 PM Medical Record Number: [id number] Account Number: [id number] of Birth/Sex: 602-15-1942((81y.o. F) Treating RN: WCornell BarmanPrimary Care Jovi Alvizo: ZMia CreekOther Clinician: VMassie KluverReferring Jakobee Brackins: ZMia CreekTreating Alondria Mousseau/Extender: HYaakov Guthriein Treatment: 2 Clinic Level of Care Assessment Items TOOL 1 Quantity Score '[]'  - Use when EandM and Procedure is performed on INITIAL visit 0 ASSESSMENTS - Nursing Assessment / Reassessment '[]'  - General Physical Exam (combine w/ comprehensive assessment (listed just  below) when performed on new 0 pt. evals) '[]'  - 0 Comprehensive Assessment (HX, ROS, Risk Assessments, Wounds Hx, etc.) ASSESSMENTS - Wound and Skin Assessment / Reassessment '[]'  - Dermatologic / Skin Assessment (not related to wound area) 0 ASSESSMENTS - Ostomy and/or Continence Assessment and Care '[]'  - Incontinence Assessment and Management 0 '[]'  - 0 Ostomy Care Assessment and Management (repouching, etc.) PROCESS - Coordination of Care '[]'  - Simple Patient / Family Education for ongoing care 0 '[]'  - 0 Complex (extensive) Patient / Family Education for ongoing care '[]'  - 0 Staff obtains CProgrammer, systems Records, Test Results / Process Orders '[]'  - 0 Staff telephones HHA, Nursing Homes / Clarify orders / etc '[]'  - 0 Routine Transfer to another Facility (non-emergent condition) '[]'  - 0 Routine Hospital Admission (non-emergent condition) '[]'  - 0 New Admissions / IBiomedical engineer/ Ordering NPWT, Apligraf, etc. '[]'  - 0 Emergency Hospital Admission (emergent condition) PROCESS - Special Needs '[]'  - Pediatric / Minor Patient Management 0 '[]'  - 0 Isolation Patient Management '[]'  - 0 Hearing / Language / Visual special needs '[]'  - 0 Assessment of Community assistance (transportation, D/C planning, etc.) '[]'  - 0 Additional assistance / Altered mentation '[]'  - 0 Support Surface(s) Assessment (bed, cushion, seat, etc.) INTERVENTIONS - Miscellaneous '[]'  - External ear exam 0 '[]'  - 0 Patient Transfer (multiple staff / HCivil Service fast streamer/ Similar devices) '[]'  - 0 Simple Staple / Suture removal (25 or less) '[]'  - 0 Complex Staple / Suture removal (26 or more) '[]'  - 0 Hypo/Hyperglycemic Management (do not check if billed separately) '[]'  - 0 Ankle / Brachial Index (ABI) - do not check if billed separately Has the patient been seen at the hospital within the last three years: Yes Total Score: 0 Level Of Care: ____ ESatira Mccallum([id number] Electronic Signature(s) Signed: 09/10/2022 12:10:36 PM By:  VMassie KluverEntered By: VMassie Kluveron 09/08/2022 14:52:10 Lupinski, Kenly B. ([id number] -------------------------------------------------------------------------------- Encounter Discharge Information Details Patient  Name: [name], [name] Date of Service: 09/08/2022 2:15 PM Medical Record Number: [id number] Patient Account Number: [id number] Date of Birth/Sex: May 05, 1941 (81 y.o. F) Treating RN: Cornell Barman Primary Care Paetyn Pietrzak: [name] Other Clinician: Massie Kluver Referring Nation Cradle: [name] Treating Rachid Parham/Extender: Yaakov Guthrie in Treatment: 2 Encounter Discharge Information Items Post Procedure Vitals Discharge Condition: Stable Temperature (F): 98.3 Ambulatory Status: Ambulatory Pulse (bpm): 84 Discharge Destination: Home Respiratory Rate (breaths/min): 18 Transportation: Private Auto Blood Pressure (mmHg): 175/82 Accompanied By: daughter Schedule Follow-up Appointment: Yes Clinical Summary of Care: Electronic Signature(s) Signed: 09/10/2022 12:10:36 PM By: Massie Kluver Entered By: Massie Kluver on 09/08/2022 15:08:43 Seto, Succ[place]unna ([id number]) -------------------------------------------------------------------------------- Lower Extremity Assessment Details Patient Name: [name] Clipper B. Date of Service: 09/08/2022 2:15 PM Medical Record Number: [id number] Patient Account Number: [id number] Date of Birth/Sex: 06/08/1941 (81 y.o. F) Treating RN: Cornell Barman Primary Care Sutter Ahlgren: [name] Other Clinician: Massie Kluver Referring Chan Rosasco: [name] Treating Tanikka Bresnan/Extender: Yaakov Guthrie in Treatment: 2 Edema Assessment Assessed: [name]: No] Patrice Paradise: Yes] Edema: [Left: Ye] [Right: s] Calf Left: Right: Point of Measurement: 30 cm From Medial Instep 33.7 cm Ankle Left: Right: Point of Measurement: 10 cm From Medial Instep 21.9 cm Vascular Assessment Pulses: Dorsalis Pedis Palpable:  [Right:Yes] Posterior Tibial Palpable: [Right:Yes] Electronic Signature(s) Signed: 09/08/2022 5:32:28 PM By: Gretta Cool, BSN, RN, CWS, Kim RN, BSN Signed: 09/10/2022 12:10:36 PM By: Massie Kluver Entered By: Massie Kluver on 09/08/2022 14:39:11 Wessell, Roberts ([id number]) -------------------------------------------------------------------------------- Multi Wound Chart Details Patient Name: KRISTIANNE, ALBIN B. Date of Service: 09/08/2022 2:15 PM Medical Record Number: [id number] Patient Account Number: [id number] Date of Birth/Sex: 08-21-1941 (81 y.o. F) Treating RN: Cornell Barman Primary Care Zavior Thomason: [name] Other Clinician: Massie Kluver Referring Darryl Willner: [name] Treating Brinda Focht/Extender: Yaakov Guthrie in Treatment: 2 Vital Signs Height(in): 34 Pulse(bpm): 46 Weight(lbs): 172 Blood Pressure(mmHg): 175/82 Body Mass Index(BMI): 28.6 Temperature(F): 98.3 Respiratory Rate(breaths/min): 18 Photos: [N/A:N/A] Wound Location: Right, Medial Lower Leg N/A N/A Wounding Event: Trauma N/A N/A Primary Etiology: Diabetic Wound/Ulcer of the Lower N/A N/A Extremity Comorbid History: Hypertension, Type II Diabetes, N/A N/A Osteoarthritis, Neuropathy, Received Chemotherapy, Received Radiation Date Acquired: 06/27/2022 N/A N/A Weeks of Treatment: 2 N/A N/A Wound Status: Open N/A N/A Wound Recurrence: No N/A N/A Measurements L x W x D (cm) 3.9x1.8x2.1 N/A N/A Area (cm) : 5.513 N/A N/A Volume (cm) : 11.578 N/A N/A % Reduction in Area: 16.40% N/A N/A % Reduction in Volume: -9.70% N/A N/A Classification: Unable to visualize wound bed N/A N/A Exudate Amount: Medium N/A N/A Exudate Type: Serous N/A N/A Exudate Color: amber N/A N/A Wound Margin: Thickened N/A N/A Granulation Amount: Small (1-33%) N/A N/A Granulation Quality: Red N/A N/A Necrotic Amount: Large (67-100%) N/A N/A Necrotic Tissue: Eschar, Adherent Slough N/A N/A Exposed Structures: Fat Layer  (Subcutaneous Tissue): N/A N/A Yes Fascia: No Tendon: No Muscle: No Joint: No Bone: No Treatment Notes Electronic Signature(s) Signed: 09/10/2022 12:10:36 PM By: Massie Kluver Entered By: Massie Kluver on 09/08/2022 14:39:28 Car, Dior B. ([id number]) Burr, Coralee Pesa ([id number]) -------------------------------------------------------------------------------- [place] Details Patient Name: EDNAH, HAMMOCK B. Date of Service: 09/08/2022 2:15 PM Medical Record Number: [id number] Patient Account Number: [id number] Date of Birth/Sex: 06/08/1941 (81 y.o. F) Treating RN: Cornell Barman Primary Care Korie Streat: [name] Other Clinician: Massie Kluver Referring Damarkus Balis: [name] Treating Dymon Summerhill/Extender: Yaakov Guthrie in Treatment: 2 Active Inactive Necrotic Tissue Nursing Diagnoses: Impaired tissue integrity related to necrotic/devitalized tissue Knowledge deficit related to management of necrotic/devitalized tissue Goals:  Necrotic/devitalized tissue will be minimized in the wound bed Date Initiated: 08/25/2022 Target Resolution Date: 08/25/2022 Goal Status: Active Patient/caregiver will verbalize understanding of reason and process for debridement of necrotic tissue Date Initiated: 08/25/2022 Target Resolution Date: 08/25/2022 Goal Status: Active Interventions: Assess patient pain level pre-, during and post procedure and prior to discharge Provide education on necrotic tissue and debridement process Treatment Activities: Excisional debridement : 08/25/2022 Notes: Orientation to the Wound Care Program Nursing Diagnoses: Knowledge deficit related to the wound healing center program Goals: Patient/caregiver will verbalize understanding of the [place] Date Initiated: 08/25/2022 Target Resolution Date: 08/25/2022 Goal Status: Active Interventions: Provide education on orientation to the wound center Notes: Pain, Acute  or Chronic Nursing Diagnoses: Pain Management - Cyclic Acute (Dressing Change Related) Pain, acute or chronic: actual or potential Potential alteration in comfort, pain Goals: Patient will verbalize adequate pain control and receive pain control interventions during procedures as needed Date Initiated: 08/25/2022 Target Resolution Date: 08/25/2022 Goal Status: Active Patient/caregiver will verbalize adequate pain control between visits Date Initiated: 08/25/2022 Target Resolution Date: 08/25/2022 Goal Status: Active Patient/caregiver will verbalize comfort level met Date Initiated: 08/25/2022 Target Resolution Date: 08/25/2022 DAWNE, CASALI ([id number]) Goal Status: Active Interventions: Provide education on pain management Reposition patient for comfort Treatment Activities: Administer pain control measures as ordered : 08/25/2022 Notes: Peripheral Neuropathy Nursing Diagnoses: Knowledge deficit related to disease process and management of peripheral neurovascular dysfunction Potential alteration in peripheral tissue perfusion (select prior to confirmation of diagnosis) Goals: Patient/caregiver will verbalize understanding of disease process and disease management Date Initiated: 08/25/2022 Target Resolution Date: 08/25/2022 Goal Status: Active Interventions: Assess signs and symptoms of neuropathy upon admission and as needed Provide education on Management of Neuropathy and Related Ulcers Notes: Wound/Skin Impairment Nursing Diagnoses: Impaired tissue integrity Knowledge deficit related to smoking impact on wound healing Knowledge deficit related to ulceration/compromised skin integrity Goals: Patient/caregiver will verbalize understanding of skin care regimen Date Initiated: 08/25/2022 Target Resolution Date: 08/25/2022 Goal Status: Active Ulcer/skin breakdown will have a volume reduction of 30% by week 4 Date Initiated: 08/25/2022 Target Resolution Date:  09/22/2022 Goal Status: Active Interventions: Assess ulceration(s) every visit Provide education on smoking Notes: Electronic Signature(s) Signed: 09/08/2022 5:32:28 PM By: Gretta Cool, BSN, RN, CWS, Kim RN, BSN Signed: 09/10/2022 12:10:36 PM By: Massie Kluver Entered By: Massie Kluver on 09/08/2022 14:39:16 Kenney, Trana B. ([id number]) -------------------------------------------------------------------------------- Pain Assessment Details Patient Name: LARKYN, GREENBERGER B. Date of Service: 09/08/2022 2:15 PM Medical Record Number: [id number] Patient Account Number: [id number] Date of Birth/Sex: 08/01/1941 (81 y.o. F) Treating RN: Cornell Barman Primary Care Sahas Sluka: [name] Other Clinician: Massie Kluver Referring Takeira Yanes: [name] Treating Trevontae Lindahl/Extender: Yaakov Guthrie in Treatment: 2 Active Problems Location of Pain Severity and Description of Pain Patient Has Paino No Site Locations Pain Management and Medication Current Pain Management: Electronic Signature(s) Signed: 09/08/2022 5:32:28 PM By: Gretta Cool, BSN, RN, CWS, Kim RN, BSN Signed: 09/10/2022 12:10:36 PM By: Massie Kluver Entered By: Massie Kluver on 09/08/2022 14:28:45 Hines, Coralee Pesa ([id number]) -------------------------------------------------------------------------------- Patient/Caregiver Education Details Patient Name: [name] Date of Service: 09/08/2022 2:15 PM Medical Record Number: [id number] Patient Account Number: [id number] Date of Birth/Gender: 1941/07/15 (81 y.o. F) Treating RN: Cornell Barman Primary Care Physician: [name] Other Clinician: Massie Kluver Referring Physician: [name] Treating Physician/Extender: Yaakov Guthrie in Treatment: 2 Education Assessment Education Provided To: Patient Education Topics Provided Wound/Skin Impairment: Handouts: Other: continue wound care as directed Methods: Explain/Verbal Responses: State content  correctly Electronic  Signature(s) Signed: 09/10/2022 12:10:36 PM By: Massie Kluver Entered By: Massie Kluver on 09/08/2022 14:52:41 Pottle, Junell BMarland Kitchen ([id number]) -------------------------------------------------------------------------------- Wound Assessment Details Patient Name: LUREE, PALLA B. Date of Service: 09/08/2022 2:15 PM Medical Record Number: [id number] Patient Account Number: [id number] Date of Birth/Sex: August 24, 1941 (81 y.o. F) Treating RN: Cornell Barman Primary Care Amarius Toto: [name] Other Clinician: Massie Kluver Referring Adryen Cookson: [name] Treating Hildreth Orsak/Extender: Yaakov Guthrie in Treatment: 2 Wound Status Wound Number: 1 Primary Diabetic Wound/Ulcer of the Lower Extremity Etiology: Wound Location: Right, Medial Lower Leg Wound Open Wounding Event: Trauma Status: Date Acquired: 06/27/2022 Comorbid Hypertension, Type II Diabetes, Osteoarthritis, Weeks Of Treatment: 2 History: Neuropathy, Received Chemotherapy, Received Radiation Clustered Wound: No Photos Wound Measurements Length: (cm) 3.9 Width: (cm) 1.8 Depth: (cm) 2.1 Area: (cm) 5.513 Volume: (cm) 11.578 % Reduction in Area: 16.4% % Reduction in Volume: -9.7% Wound Description Classification: Unable to visualize wound bed Foul Wound Margin: Thickened Slou Exudate Amount: Medium Exudate Type: Serous Exudate Color: amber Odor After Cleansing: No gh/Fibrino Yes Wound Bed Granulation Amount: Small (1-33%) Exposed Structure Granulation Quality: Red Fascia Exposed: No Necrotic Amount: Large (67-100%) Fat Layer (Subcutaneous Tissue) Exposed: Yes Necrotic Quality: Eschar, Adherent Slough Tendon Exposed: No Muscle Exposed: No Joint Exposed: No Bone Exposed: No Treatment Notes Wound #1 (Lower Leg) Wound Laterality: Right, Medial Cleanser Dakin 16 (oz) 0.25 Discharge Instruction: Use as directed. Peri-Wound Care DANA, DEBO B.  ([id number]) Topical Gentamicin Discharge Instruction: mix with Mupirocin and apply to wound bed Mupirocin Ointment Discharge Instruction: Apply as directed by Christe Tellez. Primary Dressing Hydrofera Blue Ready Transfer Foam, 4x5 (in/in) Discharge Instruction: Apply Hydrofera Blue Ready to wound bed as directed Hydrofera Blue Classic Foam Rope Dressing, 9x6 (mm/in) Discharge Instruction: lightly pack into wounds Secondary Dressing Zetuvit Plus 4x8 (in/in) Secured With Coban Cohesive Bandage 4x5 (yds) Stretched Discharge Instruction: Apply Coban as directed. Kerlix Roll Sterile or Non-Sterile 6-ply 4.5x4 (yd/yd) Discharge Instruction: Apply Kerlix as directed Compression Wrap Compression Stockings Add-Ons Electronic Signature(s) Signed: 09/08/2022 5:32:28 PM By: Gretta Cool, BSN, RN, CWS, Kim RN, BSN Signed: 09/10/2022 12:10:36 PM By: Massie Kluver Entered By: Massie Kluver on 09/08/2022 14:37:55 Linders, Rubena B. ([id number]) -------------------------------------------------------------------------------- Vitals Details Patient Name: SUKI, CROCKETT B. Date of Service: 09/08/2022 2:15 PM Medical Record Number: [id number] Patient Account Number: [id number] Date of Birth/Sex: Jan 19, 1941 (81 y.o. F) Treating RN: Cornell Barman Primary Care Hitesh Fouche: [name] Other Clinician: Massie Kluver Referring Shiya Fogelman: [name] Treating Flor Houdeshell/Extender: Yaakov Guthrie in Treatment: 2 Vital Signs Time Taken: 14:26 Temperature (F): 98.3 Height (in): 65 Pulse (bpm): 84 Weight (lbs): 172 Respiratory Rate (breaths/min): 18 Body Mass Index (BMI): 28.6 Blood Pressure (mmHg): 175/82 Reference Range: 80 - 120 mg / dl Electronic Signature(s) Signed: 09/10/2022 12:10:36 PM By: Massie Kluver Entered By: Massie Kluver on 09/08/2022 53:66:44

## 2022-09-13 ENCOUNTER — Other Ambulatory Visit: Payer: Self-pay | Admitting: Urology

## 2022-09-13 DIAGNOSIS — N3020 Other chronic cystitis without hematuria: Secondary | ICD-10-CM

## 2022-09-15 ENCOUNTER — Encounter: Payer: Medicare PPO | Attending: Internal Medicine | Admitting: Internal Medicine

## 2022-09-15 DIAGNOSIS — Z794 Long term (current) use of insulin: Secondary | ICD-10-CM | POA: Diagnosis not present

## 2022-09-15 DIAGNOSIS — L97818 Non-pressure chronic ulcer of other part of right lower leg with other specified severity: Secondary | ICD-10-CM | POA: Diagnosis not present

## 2022-09-15 DIAGNOSIS — E1140 Type 2 diabetes mellitus with diabetic neuropathy, unspecified: Secondary | ICD-10-CM | POA: Insufficient documentation

## 2022-09-15 DIAGNOSIS — I10 Essential (primary) hypertension: Secondary | ICD-10-CM | POA: Insufficient documentation

## 2022-09-15 DIAGNOSIS — M1990 Unspecified osteoarthritis, unspecified site: Secondary | ICD-10-CM | POA: Insufficient documentation

## 2022-09-15 DIAGNOSIS — I872 Venous insufficiency (chronic) (peripheral): Secondary | ICD-10-CM | POA: Insufficient documentation

## 2022-09-15 DIAGNOSIS — E11622 Type 2 diabetes mellitus with other skin ulcer: Secondary | ICD-10-CM | POA: Diagnosis present

## 2022-09-15 DIAGNOSIS — I87311 Chronic venous hypertension (idiopathic) with ulcer of right lower extremity: Secondary | ICD-10-CM | POA: Diagnosis not present

## 2022-09-15 DIAGNOSIS — G8929 Other chronic pain: Secondary | ICD-10-CM | POA: Diagnosis not present

## 2022-09-20 NOTE — Progress Notes (Signed)
SARAJANE, [name] ([id number]) Visit Report for 09/15/2022 Arrival Information Details Patient Name: [name], [name]. Date of Service: 09/15/2022 3:45 PM Medical Record Number: [id number] Patient Account Number: [id number] Date of Birth/Sex: 30-Jan-1941 (81 y.o. F) Treating RN: Cornell Barman Primary Care Johanthan Kneeland: [name] Other Clinician: Massie Kluver Referring Avrohom Mckelvin: [name] Treating Beyonce Sawatzky/Extender: Yaakov Guthrie in Treatment: 3 Visit Information History Since Last Visit All ordered tests and consults were completed: No Patient Arrived: Ambulatory Added or deleted any medications: No Arrival Time: 15:46 Any new allergies or adverse reactions: No Transfer Assistance: None Had a fall or experienced change in No Patient Requires Transmission-Based No activities of daily living that may affect Precautions: risk of falls: Patient Has Alerts: Yes Hospitalized since last visit: No Patient Alerts: Patient on Blood Pain Present Now: No Thinner 23m aspirin Type II Diabetic Electronic Signature(s) Signed: 09/20/2022 4:50:08 PM By: VMassie KluverEntered By: VMassie Kluveron 09/15/2022 15:53:00 Mealor, [name] KerrMarland [name]([id number] -------------------------------------------------------------------------------- Clinic Level of Care Assessment Details Patient Name: [name]. Date of Service: 09/15/2022 3:45 PM Medical Record Number: [id number] Account Number: [id number] of Birth/Sex: [birth year]((81y.o. F) Treating RN: WCornell BarmanPrimary Care Shulamit Donofrio: ZMia CreekOther Clinician: VMassie KluverReferring Jim Philemon: ZMia CreekTreating Jennings Corado/Extender: HYaakov Guthriein Treatment: 3 Clinic Level of Care Assessment Items TOOL 1 Quantity Score '[]'  - Use when EandM and Procedure is performed on INITIAL visit 0 ASSESSMENTS - Nursing Assessment / Reassessment '[]'  - General Physical Exam (combine w/ comprehensive assessment (listed just  below) when performed on new 0 pt. evals) '[]'  - 0 Comprehensive Assessment (HX, ROS, Risk Assessments, Wounds Hx, etc.) ASSESSMENTS - Wound and Skin Assessment / Reassessment '[]'  - Dermatologic / Skin Assessment (not related to wound area) 0 ASSESSMENTS - Ostomy and/or Continence Assessment and Care '[]'  - Incontinence Assessment and Management 0 '[]'  - 0 Ostomy Care Assessment and Management (repouching, etc.) PROCESS - Coordination of Care '[]'  - Simple Patient / Family Education for ongoing care 0 '[]'  - 0 Complex (extensive) Patient / Family Education for ongoing care '[]'  - 0 Staff obtains CProgrammer, systems Records, Test Results / Process Orders '[]'  - 0 Staff telephones HHA, Nursing Homes / Clarify orders / etc '[]'  - 0 Routine Transfer to another Facility (non-emergent condition) '[]'  - 0 Routine Hospital Admission (non-emergent condition) '[]'  - 0 New Admissions / IBiomedical engineer/ Ordering NPWT, Apligraf, etc. '[]'  - 0 Emergency Hospital Admission (emergent condition) PROCESS - Special Needs '[]'  - Pediatric / Minor Patient Management 0 '[]'  - 0 Isolation Patient Management '[]'  - 0 Hearing / Language / Visual special needs '[]'  - 0 Assessment of Community assistance (transportation, D/C planning, etc.) '[]'  - 0 Additional assistance / Altered mentation '[]'  - 0 Support Surface(s) Assessment (bed, cushion, seat, etc.) INTERVENTIONS - Miscellaneous '[]'  - External ear exam 0 '[]'  - 0 Patient Transfer (multiple staff / HCivil Service fast streamer/ Similar devices) '[]'  - 0 Simple Staple / Suture removal (25 or less) '[]'  - 0 Complex Staple / Suture removal (26 or more) '[]'  - 0 Hypo/Hyperglycemic Management (do not check if billed separately) '[]'  - 0 Ankle / Brachial Index (ABI) - do not check if billed separately Has the patient been seen at the hospital within the last three years: Yes Total Score: 0 Level Of Care: ____ ESatira Mccallum([id number] Electronic Signature(s) Signed: 09/20/2022 4:50:08 PM By:  VMassie KluverEntered By: VMassie Kluveron 09/15/2022 16:15:56 Declercq, [name]. ([id number] -------------------------------------------------------------------------------- Encounter Discharge Information Details Patient  Name: [name], [name]. Date of Service: 09/15/2022 3:45 PM Medical Record Number: [id number] Patient Account Number: [id number] Date of Birth/Sex: 1941-01-16 (81 y.o. F) Treating RN: Cornell Barman Primary Care Anjolaoluwa Siguenza: [name] Other Clinician: Massie Kluver Referring Adalie Mand: [name] Treating Alainah Phang/Extender: Yaakov Guthrie in Treatment: 3 Encounter Discharge Information Items Post Procedure Vitals Discharge Condition: Stable Temperature (F): 97.9 Ambulatory Status: Ambulatory Pulse (bpm): 82 Discharge Destination: Home Respiratory Rate (breaths/min): 16 Transportation: Private Auto Blood Pressure (mmHg): 155/80 Accompanied By: daughter Schedule Follow-up Appointment: Yes Clinical Summary of Care: Electronic Signature(s) Signed: 09/20/2022 4:50:08 PM By: Massie Kluver Entered By: Massie Kluver on 09/15/2022 16:31:43 Bence, Wheeler ([id number]) -------------------------------------------------------------------------------- Lower Extremity Assessment Details Patient Name: [name]. Date of Service: 09/15/2022 3:45 PM Medical Record Number: [id number] Patient Account Number: [id number] Date of Birth/Sex: 04/08/1941 (81 y.o. F) Treating RN: Cornell Barman Primary Care Akshaj Besancon: [name] Other Clinician: Massie Kluver Referring Alvenia Treese: [name] Treating Javante Nilsson/Extender: Yaakov Guthrie in Treatment: 3 Edema Assessment Assessed: [name]: No] [name]: Yes] Edema: [Left: Ye] [Right: s] Calf Left: Right: Point of Measurement: 30 cm From Medial Instep 33.1 cm Ankle Left: Right: Point of Measurement: 10 cm From Medial Instep 21.8 cm Vascular Assessment Pulses: Dorsalis Pedis Palpable: [Right:Yes] Posterior  Tibial Palpable: [Right:Yes] Electronic Signature(s) Signed: 09/17/2022 3:11:48 PM By: Gretta Cool, BSN, RN, CWS, Kim RN, BSN Signed: 09/20/2022 4:50:08 PM By: Massie Kluver Entered By: Massie Kluver on 09/15/2022 16:06:45 Hogle, Clemons ([id number]) -------------------------------------------------------------------------------- Multi Wound Chart Details Patient Name: MAIZE, [name]. Date of Service: 09/15/2022 3:45 PM Medical Record Number: [id number] Patient Account Number: [id number] Date of Birth/Sex: 1941-11-15 (81 y.o. F) Treating RN: Cornell Barman Primary Care Margarita Bobrowski: [name] Other Clinician: Massie Kluver Referring Margy Sumler: [name] Treating Layliana Devins/Extender: Yaakov Guthrie in Treatment: 3 Vital Signs Height(in): 65 Pulse(bpm): 71 Weight(lbs): 172 Blood Pressure(mmHg): 155/80 Body Mass Index(BMI): 28.6 Temperature(F): 97.9 Respiratory Rate(breaths/min): 18 Photos: [N/A:N/A] Wound Location: Right, Medial Lower Leg N/A N/A Wounding Event: Trauma N/A N/A Primary Etiology: Diabetic Wound/Ulcer of the Lower N/A N/A Extremity Comorbid History: Hypertension, Type II Diabetes, N/A N/A Osteoarthritis, Neuropathy, Received Chemotherapy, Received Radiation Date Acquired: 06/27/2022 N/A N/A Weeks of Treatment: 3 N/A N/A Wound Status: Open N/A N/A Wound Recurrence: No N/A N/A Measurements L x W x D (cm) 3.5x1.2x2.2 N/A N/A Area (cm) : 3.299 N/A N/A Volume (cm) : 7.257 N/A N/A % Reduction in Area: 50.00% N/A N/A % Reduction in Volume: 31.30% N/A N/A Classification: Unable to visualize wound bed N/A N/A Exudate Amount: Medium N/A N/A Exudate Type: Serous N/A N/A Exudate Color: amber N/A N/A Wound Margin: Thickened N/A N/A Granulation Amount: Small (1-33%) N/A N/A Granulation Quality: Red N/A N/A Necrotic Amount: Large (67-100%) N/A N/A Necrotic Tissue: Eschar, Adherent Slough N/A N/A Exposed Structures: Fat Layer (Subcutaneous Tissue): N/A  N/A Yes Fascia: No Tendon: No Muscle: No Joint: No Bone: No Treatment Notes Electronic Signature(s) Signed: 09/20/2022 4:50:08 PM By: Massie Kluver Entered By: Massie Kluver on 09/15/2022 16:06:59 Reber, Coralee Pesa ([id number]) [place], Coralee Pesa ([id number]) -------------------------------------------------------------------------------- [place] Details Patient Name: [name], [name]. Date of Service: 09/15/2022 3:45 PM Medical Record Number: [id number] Patient Account Number: [id number] Date of Birth/Sex: 1941/05/08 (81 y.o. F) Treating RN: Cornell Barman Primary Care Franco Duley: [name] Other Clinician: Massie Kluver Referring Kaytlin Burklow: [name] Treating Javeon Macmurray/Extender: Yaakov Guthrie in Treatment: 3 Active Inactive Necrotic Tissue Nursing Diagnoses: Impaired tissue integrity related to necrotic/devitalized tissue Knowledge deficit related to management of necrotic/devitalized tissue Goals:  Necrotic/devitalized tissue will be minimized in the wound bed Date Initiated: 08/25/2022 Target Resolution Date: 08/25/2022 Goal Status: Active Patient/caregiver will verbalize understanding of reason and process for debridement of necrotic tissue Date Initiated: 08/25/2022 Target Resolution Date: 08/25/2022 Goal Status: Active Interventions: Assess patient pain level pre-, during and post procedure and prior to discharge Provide education on necrotic tissue and debridement process Treatment Activities: Excisional debridement : 08/25/2022 Notes: Orientation to the Wound Care Program Nursing Diagnoses: Knowledge deficit related to the wound healing center program Goals: Patient/caregiver will verbalize understanding of the [place] Date Initiated: 08/25/2022 Target Resolution Date: 08/25/2022 Goal Status: Active Interventions: Provide education on orientation to the wound center Notes: Pain, Acute or Chronic Nursing  Diagnoses: Pain Management - Cyclic Acute (Dressing Change Related) Pain, acute or chronic: actual or potential Potential alteration in comfort, pain Goals: Patient will verbalize adequate pain control and receive pain control interventions during procedures as needed Date Initiated: 08/25/2022 Target Resolution Date: 08/25/2022 Goal Status: Active Patient/caregiver will verbalize adequate pain control between visits Date Initiated: 08/25/2022 Target Resolution Date: 08/25/2022 Goal Status: Active Patient/caregiver will verbalize comfort level met Date Initiated: 08/25/2022 Target Resolution Date: 08/25/2022 LAWRIE, TUNKS ([id number]) Goal Status: Active Interventions: Provide education on pain management Reposition patient for comfort Treatment Activities: Administer pain control measures as ordered : 08/25/2022 Notes: Peripheral Neuropathy Nursing Diagnoses: Knowledge deficit related to disease process and management of peripheral neurovascular dysfunction Potential alteration in peripheral tissue perfusion (select prior to confirmation of diagnosis) Goals: Patient/caregiver will verbalize understanding of disease process and disease management Date Initiated: 08/25/2022 Target Resolution Date: 08/25/2022 Goal Status: Active Interventions: Assess signs and symptoms of neuropathy upon admission and as needed Provide education on Management of Neuropathy and Related Ulcers Notes: Wound/Skin Impairment Nursing Diagnoses: Impaired tissue integrity Knowledge deficit related to smoking impact on wound healing Knowledge deficit related to ulceration/compromised skin integrity Goals: Patient/caregiver will verbalize understanding of skin care regimen Date Initiated: 08/25/2022 Target Resolution Date: 08/25/2022 Goal Status: Active Ulcer/skin breakdown will have a volume reduction of 30% by week 4 Date Initiated: 08/25/2022 Target Resolution Date: 09/22/2022 Goal Status:  Active Interventions: Assess ulceration(s) every visit Provide education on smoking Notes: Electronic Signature(s) Signed: 09/17/2022 3:11:48 PM By: Gretta Cool, BSN, RN, CWS, Kim RN, BSN Signed: 09/20/2022 4:50:08 PM By: Massie Kluver Entered By: Massie Kluver on 09/15/2022 16:06:51 Duffee, [place]. ([id number]) -------------------------------------------------------------------------------- Pain Assessment Details Patient Name: [name], [name]. Date of Service: 09/15/2022 3:45 PM Medical Record Number: [id number] Patient Account Number: [id number] Date of Birth/Sex: [birth year] (80 y.o. F) Treating RN: Cornell Barman Primary Care Latronda Spink: [name] Other Clinician: Massie Kluver Referring Archie Atilano: [name] Treating Alexx Giambra/Extender: Yaakov Guthrie in Treatment: 3 Active Problems Location of Pain Severity and Description of Pain Patient Has Paino No Site Locations Pain Management and Medication Current Pain Management: Electronic Signature(s) Signed: 09/17/2022 3:11:48 PM By: Gretta Cool, BSN, RN, CWS, Kim RN, BSN Signed: 09/20/2022 4:50:08 PM By: Massie Kluver Entered By: Massie Kluver on 09/15/2022 15:57:04 Argueta, Coralee Pesa ([id number]) -------------------------------------------------------------------------------- Patient/Caregiver Education Details Patient Name: [name], [name]. Date of Service: 09/15/2022 3:45 PM Medical Record Number: [id number] Patient Account Number: [id number] Date of Birth/Gender: [birth year] (81 y.o. F) Treating RN: Cornell Barman Primary Care Physician: [name] Other Clinician: Massie Kluver Referring Physician: [name] Treating Physician/Extender: Yaakov Guthrie in Treatment: 3 Education Assessment Education Provided To: Patient Education Topics Provided Wound/Skin Impairment: Handouts: Other: continue wound care as directed Methods: Explain/Verbal Responses: State content correctly Electronic  Signature(s) Signed: 09/20/2022 4:50:08 PM By: Massie Kluver Entered By: Massie Kluver on 09/15/2022 16:30:57 Laird, Coralee Pesa ([id number]) -------------------------------------------------------------------------------- Wound Assessment Details Patient Name: [name], [name]. Date of Service: 09/15/2022 3:45 PM Medical Record Number: [id number] Patient Account Number: [id number] Date of Birth/Sex: Feb 16, 1941 (81 y.o. F) Treating RN: Cornell Barman Primary Care Daegon Deiss: [name] Other Clinician: Massie Kluver Referring Ted Leonhart: [name] Treating Joanthan Hlavacek/Extender: Yaakov Guthrie in Treatment: 3 Wound Status Wound Number: 1 Primary Diabetic Wound/Ulcer of the Lower Extremity Etiology: Wound Location: Right, Medial Lower Leg Wound Open Wounding Event: Trauma Status: Date Acquired: 06/27/2022 Comorbid Hypertension, Type II Diabetes, Osteoarthritis, Weeks Of Treatment: 3 History: Neuropathy, Received Chemotherapy, Received Radiation Clustered Wound: No Photos Wound Measurements Length: (cm) 3.5 Width: (cm) 1.2 Depth: (cm) 2.2 Area: (cm) 3.299 Volume: (cm) 7.257 % Reduction in Area: 50% % Reduction in Volume: 31.3% Wound Description Classification: Unable to visualize wound bed Wound Margin: Thickened Exudate Amount: Medium Exudate Type: Serous Exudate Color: amber Foul Odor After Cleansing: No Slough/Fibrino Yes Wound Bed Granulation Amount: Small (1-33%) Exposed Structure Granulation Quality: Red Fascia Exposed: No Necrotic Amount: Large (67-100%) Fat Layer (Subcutaneous Tissue) Exposed: Yes Necrotic Quality: Eschar, Adherent Slough Tendon Exposed: No Muscle Exposed: No Joint Exposed: No Bone Exposed: No Treatment Notes Wound #1 (Lower Leg) Wound Laterality: Right, Medial Cleanser Dakin 16 (oz) 0.25 Discharge Instruction: Use as directed. Peri-Wound Care SOUMYA, COLSON ([id number]) Topical Santyl Collagenase Ointment, 30 (gm),  tube Discharge Instruction: apply nickel thick to wound bed only Primary Dressing Hydrofera Blue Ready Transfer Foam, 4x5 (in/in) Discharge Instruction: Apply Hydrofera Blue Ready to wound bed as directed Hydrofera Blue Classic Foam Rope Dressing, 9x6 (mm/in) Discharge Instruction: lightly pack into wounds Secondary Dressing Zetuvit Plus 4x8 (in/in) Secured With Coban Cohesive Bandage 4x5 (yds) Stretched Discharge Instruction: Apply Coban as directed. Kerlix Roll Sterile or Non-Sterile 6-ply 4.5x4 (yd/yd) Discharge Instruction: Apply Kerlix as directed Compression Wrap Compression Stockings Add-Ons Electronic Signature(s) Signed: 09/17/2022 3:11:48 PM By: Gretta Cool, BSN, RN, CWS, Kim RN, BSN Signed: 09/20/2022 4:50:08 PM By: Massie Kluver Entered By: Massie Kluver on 09/15/2022 16:05:16 Clopper, [place] ([id number]) -------------------------------------------------------------------------------- Vitals Details Patient Name: MELLISA, ARSHAD [name]. Date of Service: 09/15/2022 3:45 PM Medical Record Number: [id number] Patient Account Number: [id number] Date of Birth/Sex: 09-Apr-1941 (81 y.o. F) Treating RN: Cornell Barman Primary Care Lella Mullany: [name] Other Clinician: Massie Kluver Referring Demeco Ducksworth: [name] Treating Jerell Demery/Extender: Yaakov Guthrie in Treatment: 3 Vital Signs Time Taken: 15:53 Temperature (F): 97.9 Height (in): 65 Pulse (bpm): 82 Weight (lbs): 172 Respiratory Rate (breaths/min): 18 Body Mass Index (BMI): 28.6 Blood Pressure (mmHg): 155/80 Reference Range: 80 - 120 mg / dl Electronic Signature(s) Signed: 09/20/2022 4:50:08 PM By: Massie Kluver Entered By: Massie Kluver on 09/15/2022 15:56:43

## 2022-09-20 NOTE — Progress Notes (Signed)
KERY, HALTIWANGER ([id number]) Visit Report for 09/15/2022 Chief Complaint Document Details Patient Name: [name], [name]. Date of Service: 09/15/2022 3:45 PM Medical Record Number: [id number] Patient Account Number: [id number] Date of Birth/Sex: April 05, 1941 (81 y.o. F) Treating RN: Cornell Barman Primary Care Provider: [name] Other Clinician: Massie Kluver Referring Provider: [name] Treating Provider/Extender: Yaakov Guthrie in Treatment: 3 Information Obtained from: Patient Chief Complaint 08/25/2022; right lower extremity wounds secondary to traumatic hematoma Electronic Signature(s) Signed: 09/15/2022 4:20:55 PM By: Kalman Shan DO Entered By: Kalman Shan on 09/15/2022 16:18:10 Mcqueary, [place] ([id number]) -------------------------------------------------------------------------------- Debridement Details Patient Name: [name]. Date of Service: 09/15/2022 3:45 PM Medical Record Number: [id number] Patient Account Number: [id number] Date of Birth/Sex: 1941-02-28 (81 y.o. F) Treating RN: Cornell Barman Primary Care Provider: [name] Other Clinician: Massie Kluver Referring Provider: [name] Treating Provider/Extender: Yaakov Guthrie in Treatment: 3 Debridement Performed for Wound #1 Right,Medial Lower Leg Assessment: Performed By: Physician Kalman Shan, MD Debridement Type: Debridement Severity of Tissue Pre Debridement: Muscle involvement without necrosis Level of Consciousness (Pre- Awake and Alert procedure): Pre-procedure Verification/Time Out Yes - 16:10 Taken: Start Time: 16:10 Total Area Debrided (L x W): 3.5 (cm) x 1.2 (cm) = 4.2 (cm) Tissue and other material Viable, Non-Viable, Slough, Subcutaneous, Slough debrided: Level: Skin/Subcutaneous Tissue Debridement Description: Excisional Instrument: Curette Bleeding: Minimum Hemostasis Achieved: Pressure End Time: 16:14 Response to Treatment: Procedure  was tolerated well Level of Consciousness (Post- Awake and Alert procedure): Post Debridement Measurements of Total Wound Length: (cm) 3.5 Width: (cm) 1.2 Depth: (cm) 2.2 Volume: (cm) 7.257 Character of Wound/Ulcer Post Debridement: Stable Severity of Tissue Post Debridement: Muscle involvement without necrosis Post Procedure Diagnosis Same as Pre-procedure Electronic Signature(s) Signed: 09/15/2022 4:20:55 PM By: Kalman Shan DO Signed: 09/17/2022 3:11:48 PM By: Gretta Cool, BSN, RN, CWS, Kim RN, BSN Signed: 09/20/2022 4:50:08 PM By: Massie Kluver Entered By: Massie Kluver on 09/15/2022 16:15:42 [place], [place] ([id number]) -------------------------------------------------------------------------------- HPI Details Patient Name: [name], [name]. Date of Service: 09/15/2022 3:45 PM Medical Record Number: [id number] Patient Account Number: [id number] Date of Birth/Sex: 1941/11/01 (81 y.o. F) Treating RN: Cornell Barman Primary Care Provider: [name] Other Clinician: Massie Kluver Referring Provider: [name] Treating Provider/Extender: Yaakov Guthrie in Treatment: 3 History of Present Illness HPI Description: Admission 08/25/2022 [name] is an 81 year old female with a past medical history of breast cancer, with insulin dependent, controlled type 2 diabetes, chronic venous insufficiency that presents to the clinic for a 85-monthhistory of nonhealing ulcer to the right lower extremity. She states she hit her leg against a stair and developed a hematoma. The area was evacuated by her primary care physician. She has tried Neosporin and wet-to- dry dressings. She reports mild chronic pain to the area. She denies increased warmth, erythema or purulent drainage. 9/20; patient presents for follow-up. She had an x-ray of the right leg done at last clinic visit that did not show Evidence of osteomyelitis. She has been doing Dakin's wet-to-dry dressings without  issues. She denies signs of infection. 9/27; patient presents for follow-up. Hydrofera Blue with antibiotic ointment was used under Kerlix/Coban at last clinic visit. She tolerated this well. She has no issues or complaints today. She denies signs of infection. 10/4; patient presents for follow-up. We have been using Hydrofera Blue to the wound bed all under Kerlix/Coban. She has no issues or complaints today. She has tolerated this well. Electronic Signature(s) Signed: 09/15/2022 4:20:55 PM By: HKalman ShanDO Entered By: HHeber Cascade  Shain Pauwels on 09/15/2022 16:18:40 JALESA, THIEN ([id number]) -------------------------------------------------------------------------------- Physical Exam Details Patient Name: [name], [name]. Date of Service: 09/15/2022 3:45 PM Medical Record Number: [id number] Patient Account Number: [id number] Date of Birth/Sex: 11/13/1941 (81 y.o. F) Treating RN: Cornell Barman Primary Care Provider: [name] Other Clinician: Massie Kluver Referring Provider: [name] Treating Provider/Extender: Yaakov Guthrie in Treatment: 3 Constitutional . Cardiovascular . Psychiatric . Notes Right lower extremity: To the distal medial aspect there is an open wound with granulation tissue and nonviable tissue With 2 tunnels of increased depth. No surrounding soft tissue infection including increased warmth, erythema or purulent drainage. Venous stasis dermatitis. Good edema control. Electronic Signature(s) Signed: 09/15/2022 4:20:55 PM By: Kalman Shan DO Entered By: Kalman Shan on 09/15/2022 16:19:13 [place], [name] ([id number]) -------------------------------------------------------------------------------- Physician Orders Details Patient Name: [name]. Date of Service: 09/15/2022 3:45 PM Medical Record Number: [id number] Patient Account Number: [id number] Date of Birth/Sex: 1941/04/09 (81 y.o. F) Treating RN: Cornell Barman Primary Care  Provider: [name] Other Clinician: Massie Kluver Referring Provider: [name] Treating Provider/Extender: Yaakov Guthrie in Treatment: 3 Verbal / Phone Orders: No Diagnosis Coding Follow-up Appointments Wound #1 Right,Medial Lower Leg o Return Appointment in 1 week. Bathing/ Shower/ Hygiene o May shower with wound dressing protected with water repellent cover or cast protector. o No tub bath. Edema Control - Lymphedema / Segmental Compressive Device / Other o Tubigrip single layer applied. - Tubi D o Other: - kerlix and coban right lower leg Wound Treatment Wound #1 - Lower Leg Wound Laterality: Right, Medial Cleanser: Dakin 16 (oz) 0.25 1 x Per Week/30 Days Discharge Instructions: Use as directed. Topical: Santyl Collagenase Ointment, 30 (gm), tube 1 x Per Week/30 Days Discharge Instructions: apply nickel thick to wound bed only Primary Dressing: Hydrofera Blue Ready Transfer Foam, 4x5 (in/in) 1 x Per Week/30 Days Discharge Instructions: Apply Hydrofera Blue Ready to wound bed as directed Primary Dressing: Hydrofera Blue Classic Foam Rope Dressing, 9x6 (mm/in) 1 x Per Week/30 Days Discharge Instructions: lightly pack into wounds Secondary Dressing: Zetuvit Plus 4x8 (in/in) 1 x Per Week/30 Days Secured With: Coban Cohesive Bandage 4x5 (yds) Stretched 1 x Per Week/30 Days Discharge Instructions: Apply Coban as directed. Secured With: [employer] or Non-Sterile 6-ply 4.5x4 (yd/yd) 1 x Per Week/30 Days Discharge Instructions: Apply Kerlix as directed Electronic Signature(s) Signed: 09/15/2022 4:20:55 PM By: Kalman Shan DO Entered By: Kalman Shan on 09/15/2022 16:20:19 Nies, [name] ([id number]) -------------------------------------------------------------------------------- Problem List Details Patient Name: [name], [name]. Date of Service: 09/15/2022 3:45 PM Medical Record Number: [id number] Patient Account Number:  [id number] Date of Birth/Sex: Dec 08, 1941 (81 y.o. F) Treating RN: Cornell Barman Primary Care Provider: [name] Other Clinician: Massie Kluver Referring Provider: [name] Treating Provider/Extender: Yaakov Guthrie in Treatment: 3 Active Problems ICD-10 Encounter Code Description Active Date MDM Diagnosis L97.818 Non-pressure chronic ulcer of other part of right lower leg with other 08/25/2022 No Yes specified severity T79.8XXA Other early complications of trauma, initial encounter 08/25/2022 No Yes T79.2XXA Traumatic secondary and recurrent hemorrhage and seroma, initial 08/25/2022 No Yes encounter E11.622 Type 2 diabetes mellitus with other skin ulcer 08/25/2022 No Yes E03.9 Hypothyroidism, unspecified 08/25/2022 No Yes I10 Essential (primary) hypertension 08/25/2022 No Yes I87.311 Chronic venous hypertension (idiopathic) with ulcer of right lower 08/25/2022 No Yes extremity Inactive Problems Resolved Problems Electronic Signature(s) Signed: 09/15/2022 4:20:55 PM By: Kalman Shan DO Entered By: Kalman Shan on 09/15/2022 16:18:05 Krehbiel, [name]. ([id number]) -------------------------------------------------------------------------------- Progress Note Details Patient  Name: [name], DULAK. Date of Service: 09/15/2022 3:45 PM Medical Record Number: [id number] Patient Account Number: [id number] Date of Birth/Sex: [birth year] (81 y.o. F) Treating RN: Cornell Barman Primary Care Provider: [name] Other Clinician: Massie Kluver Referring Provider: [name] Treating Provider/Extender: Yaakov Guthrie in Treatment: 3 Subjective Chief Complaint Information obtained from Patient 08/25/2022; right lower extremity wounds secondary to traumatic hematoma History of Present Illness (HPI) Admission 08/25/2022 Ms. Aleyssa Pike is an 81 year old female with a past medical history of breast cancer, with insulin dependent, controlled type 2 diabetes, chronic  venous insufficiency that presents to the clinic for a 56-monthhistory of nonhealing ulcer to the right lower extremity. She states she hit her leg against a stair and developed a hematoma. The area was evacuated by her primary care physician. She has tried Neosporin and wet-to- dry dressings. She reports mild chronic pain to the area. She denies increased warmth, erythema or purulent drainage. 9/20; patient presents for follow-up. She had an x-ray of the right leg done at last clinic visit that did not show Evidence of osteomyelitis. She has been doing Dakin's wet-to-dry dressings without issues. She denies signs of infection. 9/27; patient presents for follow-up. Hydrofera Blue with antibiotic ointment was used under Kerlix/Coban at last clinic visit. She tolerated this well. She has no issues or complaints today. She denies signs of infection. 10/4; patient presents for follow-up. We have been using Hydrofera Blue to the wound bed all under Kerlix/Coban. She has no issues or complaints today. She has tolerated this well. Objective Constitutional Vitals Time Taken: 3:53 PM, Height: 65 in, Weight: 172 lbs, BMI: 28.6, Temperature: 97.9 F, Pulse: 82 bpm, Respiratory Rate: 18 breaths/min, Blood Pressure: 155/80 mmHg. General Notes: Right lower extremity: To the distal medial aspect there is an open wound with granulation tissue and nonviable tissue With 2 tunnels of increased depth. No surrounding soft tissue infection including increased warmth, erythema or purulent drainage. Venous stasis dermatitis. Good edema control. Integumentary (Hair, Skin) Wound #1 status is Open. Original cause of wound was Trauma. The date acquired was: 06/27/2022. The wound has been in treatment 3 weeks. The wound is located on the Right,Medial Lower Leg. The wound measures 3.5cm length x 1.2cm width x 2.2cm depth; 3.299cm^2 area and 7.257cm^3 volume. There is Fat Layer (Subcutaneous Tissue) exposed. There is a medium  amount of serous drainage noted. The wound margin is thickened. There is small (1-33%) red granulation within the wound bed. There is a large (67-100%) amount of necrotic tissue within the wound bed including Eschar and Adherent Slough. Assessment Active Problems ICD-10 Non-pressure chronic ulcer of other part of right lower leg with other specified severity Other early complications of trauma, initial encounter Traumatic secondary and recurrent hemorrhage and seroma, initial encounter Type 2 diabetes mellitus with other skin ulcer Hypothyroidism, unspecified Essential (primary) hypertension Chronic venous hypertension (idiopathic) with ulcer of right lower extremity Mikula, [name]. ([id number] Patient's wound has shown improvement in size and appearance since last clinic visit. I debrided nonviable tissue. I recommended continuing the course with Hydrofera Blue and rope to the tunnels. We will add Santyl. Continue Kerlix/Coban. Follow-up in 1 week. Procedures Wound #1 Pre-procedure diagnosis of Wound #1 is a Diabetic Wound/Ulcer of the Lower Extremity located on the Right,Medial Lower Leg .Severity of Tissue Pre Debridement is: Muscle involvement without necrosis. There was a Excisional Skin/Subcutaneous Tissue Debridement with a total area of 4.2 sq cm performed by HKalman Shan MD. With the following instrument(s): Curette to  remove Viable and Non-Viable tissue/material. Material removed includes Subcutaneous Tissue and Slough and. A time out was conducted at 16:10, prior to the start of the procedure. A Minimum amount of bleeding was controlled with Pressure. The procedure was tolerated well. Post Debridement Measurements: 3.5cm length x 1.2cm width x 2.2cm depth; 7.257cm^3 volume. Character of Wound/Ulcer Post Debridement is stable. Severity of Tissue Post Debridement is: Muscle involvement without necrosis. Post procedure Diagnosis Wound #1: Same as  Pre-Procedure Plan Follow-up Appointments: Wound #1 Right,Medial Lower Leg: Return Appointment in 1 week. Bathing/ Shower/ Hygiene: May shower with wound dressing protected with water repellent cover or cast protector. No tub bath. Edema Control - Lymphedema / Segmental Compressive Device / Other: Tubigrip single layer applied. - Tubi D Other: - kerlix and coban right lower leg WOUND #1: - Lower Leg Wound Laterality: Right, Medial Cleanser: Dakin 16 (oz) 0.25 1 x Per Week/30 Days Discharge Instructions: Use as directed. Topical: Santyl Collagenase Ointment, 30 (gm), tube 1 x Per Week/30 Days Discharge Instructions: apply nickel thick to wound bed only Primary Dressing: Hydrofera Blue Ready Transfer Foam, 4x5 (in/in) 1 x Per Week/30 Days Discharge Instructions: Apply Hydrofera Blue Ready to wound bed as directed Primary Dressing: Hydrofera Blue Classic Foam Rope Dressing, 9x6 (mm/in) 1 x Per Week/30 Days Discharge Instructions: lightly pack into wounds Secondary Dressing: Zetuvit Plus 4x8 (in/in) 1 x Per Week/30 Days Secured With: Coban Cohesive Bandage 4x5 (yds) Stretched 1 x Per Week/30 Days Discharge Instructions: Apply Coban as directed. Secured With: [employer] or Non-Sterile 6-ply 4.5x4 (yd/yd) 1 x Per Week/30 Days Discharge Instructions: Apply Kerlix as directed 1. In office sharp debridement 2. Hydrofera Blue 3. Santyl 4. Kerlix/Coban 5. Follow-up in 1 week Electronic Signature(s) Signed: 09/15/2022 4:20:55 PM By: Kalman Shan DO Entered By: Kalman Shan on 09/15/2022 16:19:52 Burkard, [place] ([id number]) -------------------------------------------------------------------------------- SuperBill Details Patient Name: [name]. Date of Service: 09/15/2022 Medical Record Number: [id number] Patient Account Number: [id number] Date of Birth/Sex: 09-Jan-1941 (81 y.o. F) Treating RN: Cornell Barman Primary Care Provider: [name] Other Clinician:  Massie Kluver Referring Provider: [name] Treating Provider/Extender: Yaakov Guthrie in Treatment: 3 Diagnosis Coding ICD-10 Codes Code Description [id number] Non-pressure chronic ulcer of other part of right lower leg with other specified severity T79.8XXA Other early complications of trauma, initial encounter T47.2XXA Traumatic secondary and recurrent hemorrhage and seroma, initial encounter E11.622 Type 2 diabetes mellitus with other skin ulcer E03.9 Hypothyroidism, unspecified I10 Essential (primary) hypertension I87.311 Chronic venous hypertension (idiopathic) with ulcer of right lower extremity Facility Procedures CPT4 Code: 81103159 Description: 45859 - DEB SUBQ TISSUE 20 SQ CM/< Modifier: Quantity: 1 CPT4 Code: Description: ICD-10 Diagnosis Description L97.818 Non-pressure chronic ulcer of other part of right lower leg with other spec Modifier: ified severity Quantity: Physician Procedures CPT4 Code: [id number] Description: 11042 - WC PHYS SUBQ TISS 20 SQ CM Modifier: Quantity: 1 CPT4 Code: Description: ICD-10 Diagnosis Description L97.818 Non-pressure chronic ulcer of other part of right lower leg with other spec Modifier: ified severity Quantity: Electronic Signature(s) Signed: 09/15/2022 4:20:55 PM By: Kalman Shan DO Entered By: Kalman Shan on 09/15/2022 16:20:11

## 2022-09-22 ENCOUNTER — Encounter (HOSPITAL_BASED_OUTPATIENT_CLINIC_OR_DEPARTMENT_OTHER): Payer: Medicare PPO | Admitting: Internal Medicine

## 2022-09-22 DIAGNOSIS — L97818 Non-pressure chronic ulcer of other part of right lower leg with other specified severity: Secondary | ICD-10-CM

## 2022-09-22 DIAGNOSIS — E11622 Type 2 diabetes mellitus with other skin ulcer: Secondary | ICD-10-CM | POA: Diagnosis not present

## 2022-09-29 ENCOUNTER — Encounter (HOSPITAL_BASED_OUTPATIENT_CLINIC_OR_DEPARTMENT_OTHER): Payer: Medicare PPO | Admitting: Internal Medicine

## 2022-09-29 DIAGNOSIS — L97818 Non-pressure chronic ulcer of other part of right lower leg with other specified severity: Secondary | ICD-10-CM

## 2022-09-29 DIAGNOSIS — E11622 Type 2 diabetes mellitus with other skin ulcer: Secondary | ICD-10-CM | POA: Diagnosis not present

## 2022-09-30 NOTE — Progress Notes (Signed)
[name], [name] ([id number]) [id number] Page 1 of 7 Visit Report for 09/29/2022 Chief Complaint Document Details Patient Name: Date of Service: [name], [name] 09/29/2022 2:15 PM Medical Record Number: [id number] Patient Account Number: [id number] Date of Birth/Sex: Treating RN: 1941-02-19 (81 y.o. [name] Primary Care Provider: [name] Other Clinician: Massie Kluver Referring Provider: Treating Provider/Extender: Laural Roes in Treatment: 5 Information Obtained from: Patient Chief Complaint 08/25/2022; right lower extremity wounds secondary to traumatic hematoma Electronic Signature(s) Signed: 09/29/2022 3:26:25 PM By: Kalman Shan DO Entered By: Kalman Shan on 09/29/2022 15:10:07 -------------------------------------------------------------------------------- Debridement Details Patient Name: Date of Service: [name] 09/29/2022 2:15 PM Medical Record Number: [id number] Patient Account Number: [id number] Date of Birth/Sex: Treating RN: 22-Aug-1941 (81 y.o. [name] Primary Care Provider: [name] Other Clinician: Massie Kluver Referring Provider: Treating Provider/Extender: Laural Roes in Treatment: 5 Debridement Performed for Assessment: Wound #1 Right,Medial Lower Leg Performed By: Physician Kalman Shan, MD Debridement Type: Debridement Severity of Tissue Pre Debridement: Fat layer exposed Level of Consciousness (Pre-procedure): Awake and Alert Pre-procedure Verification/Time Out Yes - 15:05 Taken: Start Time: 15:05 T Area Debrided (L x W): otal 3 (cm) x 0.5 (cm) = 1.5 (cm) Tissue and other material debrided: Viable, Non-Viable, Slough, Subcutaneous, Slough Level: Skin/Subcutaneous Tissue Debridement Description: Excisional Instrument: Curette Bleeding: Minimum Hemostasis Achieved: Pressure End Time: 15:07 Response to Treatment: Procedure  was tolerated well Level of Consciousness (Post- Awake and Alert procedure): ERABELLA, KUIPERS B ([id number]) [id number] Page 2 of 7 Post Debridement Measurements of Total Wound Length: (cm) 3.4 Width: (cm) 0.7 Depth: (cm) 2.5 Volume: (cm) 4.673 Character of Wound/Ulcer Post Debridement: Stable Severity of Tissue Post Debridement: Fat layer exposed Post Procedure Diagnosis Same as Pre-procedure Electronic Signature(s) Signed: 09/29/2022 3:26:25 PM By: Kalman Shan DO Signed: 09/29/2022 3:41:25 PM By: Gretta Cool, BSN, RN, CWS, Kim RN, BSN Signed: 09/30/2022 11:23:10 AM By: Massie Kluver Entered By: Massie Kluver on 09/29/2022 15:07:42 -------------------------------------------------------------------------------- HPI Details Patient Name: Date of Service: [name] 09/29/2022 2:15 PM Medical Record Number: [id number] Patient Account Number: [id number] Date of Birth/Sex: Treating RN: Apr 20, 1941 (81 y.o. [name] Primary Care Provider: [name] Other Clinician: Massie Kluver Referring Provider: Treating Provider/Extender: Laural Roes in Treatment: 5 History of Present Illness HPI Description: Admission 08/25/2022 [name] is an 81 year old female with a past medical history of breast cancer, with insulin dependent, controlled type 2 diabetes, chronic venous insufficiency that presents to the clinic for a 14-monthhistory of nonhealing ulcer to the right lower extremity. She states she hit her leg against a stair and developed a hematoma. The area was evacuated by her primary care physician. She has tried Neosporin and wet-to-dry dressings. She reports mild chronic pain to the area. She denies increased warmth, erythema or purulent drainage. 9/20; patient presents for follow-up. She had an x-ray of the right leg done at last clinic visit that did not show Evidence of osteomyelitis. She has been  doing Dakin's wet-to-dry dressings without issues. She denies signs of infection. 9/27; patient presents for follow-up. Hydrofera Blue with antibiotic ointment was used under Kerlix/Coban at last clinic visit. She tolerated this well. She has no issues or complaints today. She denies signs of infection. 10/4; patient presents for follow-up. We have been using Hydrofera Blue to the wound bed all under Kerlix/Coban. She has no issues or complaints today. She has tolerated this well. 10/11; patient presents for follow-up. We have  been using Hydrofera Blue to the wound bed under Kerlix/Coban. She has tolerated this well and has no issues or complaints. 10/18; patient presents for follow-up. We have been using Hydrofera Blue with Santyl to the wound bed under Kerlix/Coban. She has no issues or complaints today. Electronic Signature(s) Signed: 09/29/2022 3:26:25 PM By: Kalman Shan DO Entered By: Kalman Shan on 09/29/2022 15:10:30 [name] ([id number]) [id number] Page 3 of 7 -------------------------------------------------------------------------------- Physical Exam Details Patient Name: Date of Service: TIDA, SANER 09/29/2022 2:15 PM Medical Record Number: [id number] Patient Account Number: [id number] Date of Birth/Sex: Treating RN: 1941/09/13 (81 y.o. [name] Primary Care Provider: [name] Other Clinician: Massie Kluver Referring Provider: Treating Provider/Extender: Laural Roes in Treatment: 5 Constitutional . Cardiovascular . Psychiatric . Notes Right lower extremity: T the distal medial aspect there is an open wound with granulation tissue and nonviable tissue With 2 tunnels of increased depth. No o surrounding soft tissue infection including increased warmth, erythema or purulent drainage. Venous stasis dermatitis. Good edema control. Electronic Signature(s) Signed: 09/29/2022 3:26:25 PM  By: Kalman Shan DO Entered By: Kalman Shan on 09/29/2022 15:10:58 -------------------------------------------------------------------------------- Physician Orders Details Patient Name: Date of Service: [name] 09/29/2022 2:15 PM Medical Record Number: [id number] Patient Account Number: [id number] Date of Birth/Sex: Treating RN: 1941-08-18 (81 y.o. [name] Primary Care Provider: [name] Other Clinician: Massie Kluver Referring Provider: Treating Provider/Extender: Laural Roes in Treatment: 5 Verbal / Phone Orders: No Diagnosis Coding Follow-up Appointments Wound #1 Right,Medial Lower Leg Return Appointment in 1 week. Bathing/ Shower/ Hygiene May shower with wound dressing protected with water repellent cover or cast protector. No tub bath. Edema Control - Lymphedema / Segmental Compressive Device / Other Tubigrip single layer applied. - Tubi D Other: - kerlix and coban right lower leg ANACAROLINA, EVELYN B ([id number]) [id number] Page 4 of 7 Wound Treatment Wound #1 - Lower Leg Wound Laterality: Right, Medial Cleanser: Dakin 16 (oz) 0.25 1 x Per Week/30 Days Discharge Instructions: Use as directed. Topical: Santyl Collagenase Ointment, 30 (gm), tube 1 x Per Week/30 Days Discharge Instructions: apply nickel thick to wound bed only Prim Dressing: Hydrofera Blue Ready Transfer Foam, 4x5 (in/in) 1 x Per Week/30 Days ary Discharge Instructions: Apply Hydrofera Blue Ready to wound bed as directed Prim Dressing: Hydrofera Blue Classic Foam Rope Dressing, 9x6 (mm/in) ary 1 x Per Week/30 Days Discharge Instructions: lightly pack into wounds Secondary Dressing: Zetuvit Plus 4x8 (in/in) 1 x Per Week/30 Days Secured With: Coban Cohesive Bandage 4x5 (yds) Stretched 1 x Per Week/30 Days Discharge Instructions: Apply Coban as directed. Secured With: [employer] or Non-Sterile 6-ply 4.5x4 (yd/yd) 1 x  Per Week/30 Days Discharge Instructions: Apply Kerlix as directed Electronic Signature(s) Signed: 09/29/2022 3:26:25 PM By: Kalman Shan DO Entered By: Kalman Shan on 09/29/2022 15:12:06 -------------------------------------------------------------------------------- Problem List Details Patient Name: Date of Service: [name] 09/29/2022 2:15 PM Medical Record Number: [id number] Patient Account Number: [id number] Date of Birth/Sex: Treating RN: [birth year] (81 y.o. [name] Primary Care Provider: [name] Other Clinician: Massie Kluver Referring Provider: Treating Provider/Extender: Laural Roes in Treatment: 5 Active Problems ICD-10 Encounter Code Description Active Date MDM Diagnosis L97.818 Non-pressure chronic ulcer of other part of right lower leg with other specified 08/25/2022 No Yes severity T79.8XXA Other early complications of trauma, initial encounter 08/25/2022 No Yes T79.2XXA Traumatic secondary and recurrent hemorrhage and seroma, initial encounter 08/25/2022 No Yes E11.622 Type  2 diabetes mellitus with other skin ulcer 08/25/2022 No Yes E03.9 Hypothyroidism, unspecified 08/25/2022 No Yes NENE, ARANAS B ([id number]) [id number] Page 5 of 7 I10 Essential (primary) hypertension 08/25/2022 No Yes I87.311 Chronic venous hypertension (idiopathic) with ulcer of right lower extremity 08/25/2022 No Yes Inactive Problems Resolved Problems Electronic Signature(s) Signed: 09/29/2022 3:26:25 PM By: Kalman Shan DO Entered By: Kalman Shan on 09/29/2022 15:10:02 -------------------------------------------------------------------------------- Progress Note Details Patient Name: Date of Service: [name] 09/29/2022 2:15 PM Medical Record Number: [id number] Patient Account Number: [id number] Date of Birth/Sex: Treating RN: 08-23-1941 (81 y.o. [name] Primary Care Provider: [name] Other Clinician: Massie Kluver Referring Provider: Treating Provider/Extender: Laural Roes in Treatment: 5 Subjective Chief Complaint Information obtained from Patient 08/25/2022; right lower extremity wounds secondary to traumatic hematoma History of Present Illness (HPI) Admission 08/25/2022 Ms. Oprah Camarena is an 81 year old female with a past medical history of breast cancer, with insulin dependent, controlled type 2 diabetes, chronic venous insufficiency that presents to the clinic for a 26-monthhistory of nonhealing ulcer to the right lower extremity. She states she hit her leg against a stair and developed a hematoma. The area was evacuated by her primary care physician. She has tried Neosporin and wet-to-dry dressings. She reports mild chronic pain to the area. She denies increased warmth, erythema or purulent drainage. 9/20; patient presents for follow-up. She had an x-ray of the right leg done at last clinic visit that did not show Evidence of osteomyelitis. She has been doing Dakin's wet-to-dry dressings without issues. She denies signs of infection. 9/27; patient presents for follow-up. Hydrofera Blue with antibiotic ointment was used under Kerlix/Coban at last clinic visit. She tolerated this well. She has no issues or complaints today. She denies signs of infection. 10/4; patient presents for follow-up. We have been using Hydrofera Blue to the wound bed all under Kerlix/Coban. She has no issues or complaints today. She has tolerated this well. 10/11; patient presents for follow-up. We have been using Hydrofera Blue to the wound bed under Kerlix/Coban. She has tolerated this well and has no issues or complaints. 10/18; patient presents for follow-up. We have been using Hydrofera Blue with Santyl to the wound bed under Kerlix/Coban. She has no issues or complaints today. OTHERMA, LASURE([id number]  [id number] Page 6 of 7 Constitutional Vitals Time Taken: 2:46 AM, Height: 65 in, Weight: 172 lbs, BMI: 28.6, Temperature: 97.7 F, Pulse: 89 bpm, Respiratory Rate: 18 breaths/min, Blood Pressure: 144/81 mmHg. General Notes: Right lower extremity: T the distal medial aspect there is an open wound with granulation tissue and nonviable tissue With 2 tunnels of o increased depth. No surrounding soft tissue infection including increased warmth, erythema or purulent drainage. Venous stasis dermatitis. Good edema control. Integumentary (Hair, Skin) Wound #1 status is Open. Original cause of wound was Trauma. The date acquired was: 06/27/2022. The wound has been in treatment 5 weeks. The wound is located on the Right,Medial Lower Leg. The wound measures 3.4cm length x 0.7cm width x 2.5cm depth; 1.869cm^2 area and 4.673cm^3 volume. There is Fat Layer (Subcutaneous Tissue) exposed. There is no tunneling noted. There is a medium amount of serosanguineous drainage noted. The wound margin is thickened. There is small (1-33%) red granulation within the wound bed. There is a large (67-100%) amount of necrotic tissue within the wound bed including Eschar and Adherent Slough. 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 I debrided nonviable tissue. I recommended Santyl and Hydrofera Blue under Kerlix/Coban. Procedures Wound #1 Pre-procedure diagnosis of Wound #1 is a Diabetic Wound/Ulcer of the Lower Extremity located on the Right,Medial Lower Leg  .Severity of Tissue Pre Debridement is: Fat layer exposed. There was a Excisional Skin/Subcutaneous Tissue Debridement with a total area of 1.5 sq cm performed by Kalman Shan, MD. With the following instrument(s): Curette to remove Viable and Non-Viable tissue/material. Material removed includes Subcutaneous Tissue and Slough and. A time out was conducted at 15:05, prior to the start of the procedure. A Minimum amount of bleeding was controlled with Pressure. The procedure was tolerated well. Post Debridement Measurements: 3.4cm length x 0.7cm width x 2.5cm depth; 4.673cm^3 volume. Character of Wound/Ulcer Post Debridement is stable. Severity of Tissue Post Debridement is: Fat layer exposed. Post procedure Diagnosis Wound #1: Same as Pre-Procedure Plan Follow-up Appointments: Wound #1 Right,Medial Lower Leg: Return Appointment in 1 week. Bathing/ Shower/ Hygiene: May shower with wound dressing protected with water repellent cover or cast protector. No tub bath. Edema Control - Lymphedema / Segmental Compressive Device / Other: Tubigrip single layer applied. - Tubi D Other: - kerlix and coban right lower leg WOUND #1: - Lower Leg Wound Laterality: Right, Medial Cleanser: Dakin 16 (oz) 0.25 1 x Per Week/30 Days Discharge Instructions: Use as directed. Topical: Santyl Collagenase Ointment, 30 (gm), tube 1 x Per Week/30 Days Discharge Instructions: apply nickel thick to wound bed only Prim Dressing: Hydrofera Blue Ready Transfer Foam, 4x5 (in/in) 1 x Per Week/30 Days ary Discharge Instructions: Apply Hydrofera Blue Ready to wound bed as directed Prim Dressing: Hydrofera Blue Classic Foam Rope Dressing, 9x6 (mm/in) 1 x Per Week/30 Days ary Discharge Instructions: lightly pack into wounds Secondary Dressing: Zetuvit Plus 4x8 (in/in) 1 x Per Week/30 Days Secured With: Coban Cohesive Bandage 4x5 (yds) Stretched 1 x Per Week/30 Days Discharge Instructions: Apply Coban as directed. Secured  With: [employer] or Non-Sterile 6-ply 4.5x4 (yd/yd) 1 x Per Week/30 Days Discharge Instructions: Apply Kerlix as directed 1. In office sharp debridement 2. [address] and Santyl under TASHIYA, SOUDERS B ([id number]) [id number] Page 7 of 7 3. Follow-up in 1 week Electronic Signature(s) Signed: 09/29/2022 3:26:25 PM By: Kalman Shan DO Entered By: Kalman Shan on 09/29/2022 15:11:51 -------------------------------------------------------------------------------- SuperBill Details Patient Name: Date of Service: [name] 09/29/2022 Medical Record Number: [id number] Patient Account Number: [id number] Date of Birth/Sex: Treating RN: 04-24-1941 (81 y.o. [name] Primary Care Provider: [name] Other Clinician: Massie Kluver Referring Provider: Treating Provider/Extender: Laural Roes in Treatment: 5 Diagnosis Coding ICD-10 Codes Code Description [id number] Non-pressure chronic ulcer of other part of right lower leg with other specified severity T79.8XXA Other early complications of trauma, initial encounter T41.2XXA Traumatic secondary and recurrent hemorrhage and seroma, initial encounter E11.622 Type 2 diabetes mellitus with other skin ulcer E03.9 Hypothyroidism, unspecified I10 Essential (primary) hypertension I87.311 Chronic venous hypertension (idiopathic) with ulcer of right lower extremity Facility Procedures : CPT4 Code: [id number] Description: 37858 - DEB SUBQ TISSUE 20 SQ CM/< ICD-10 Diagnosis Description L97.818 Non-pressure chronic ulcer of other part of right lower leg with other specified Modifier: severity Quantity: 1 Physician Procedures : CPT4 Code Description Modifier [id number] -  WC PHYS SUBQ TISS 20 SQ CM ICD-10 Diagnosis Description L97.818 Non-pressure chronic ulcer of other part of right lower leg with other specified severity Quantity: 1 Electronic  Signature(s) Signed: 09/29/2022 3:26:25 PM By: Kalman Shan DO Entered By: Kalman Shan on 09/29/2022 15:11:59

## 2022-09-30 NOTE — Progress Notes (Signed)
[name], [name] ([id number]) [id number] Page 1 of 9 Visit Report for 09/22/2022 Arrival Information Details Patient Name: Date of Service: [name], [name] 09/22/2022 11:15 A M Medical Record Number: [id number] Patient Account Number: [id number] Date of Birth/Sex: Treating RN: 04-07-1941 (81 y.o. Orvan Falconer Primary Care Treazure Nery: [name] Other Clinician: Massie Kluver Referring Joniyah Mallinger: Treating Aavya Shafer/Extender: Laural Roes in Treatment: 4 Visit Information History Since Last Visit All ordered tests and consults were completed: No Patient Arrived: Ambulatory Added or deleted any medications: No Arrival Time: 11:40 Any new allergies or adverse reactions: No Transfer Assistance: None Had a fall or experienced change in No Patient Requires Transmission-Based Precautions: No activities of daily living that may affect Patient Has Alerts: Yes risk of falls: Patient Alerts: Patient on Blood Thinner Hospitalized since last visit: No 46m aspirin Pain Present Now: No Type II Diabetic Electronic Signature(s) Signed: 09/30/2022 11:24:03 AM By: VMassie KluverEntered By: VMassie Kluveron 09/22/2022 11:44:01 -------------------------------------------------------------------------------- Clinic Level of Care Assessment Details Patient Name: Date of Service: [name], FLUELLEN10/10/2022 11:15 A M Medical Record Number: [id number] Account Number: [id number] of Birth/Sex: Treating RN: [birth year](81y.o. FOrvan FalconerPrimary Care Aylinn Rydberg: ZMia CreekOther Clinician: VMassie KluverReferring Frazer Rainville: Treating Wallice Granville/Extender: HLaural Roesin Treatment: 4 Clinic Level of Care Assessment Items TOOL 1 Quantity Score '[]'  - 0 Use when EandM and Procedure is performed on INITIAL visit ASSESSMENTS - Nursing Assessment / Reassessment '[]'  - 0 General Physical Exam (combine w/  comprehensive assessment (listed just below) when performed on new pt. evals) '[]'  - 0 Comprehensive Assessment (HX, ROS, Risk Assessments, Wounds Hx, etc.) ASSESSMENTS - Wound and Skin Assessment / Reassessment '[]'  - 0 Dermatologic / Skin Assessment (not related to wound area) ASSESSMENTS - Ostomy and/or Continence Assessment and Care ELUVIA, ORZECHOWSKIB ([id number] Page 2 of 9 '[]'  - 0 Incontinence Assessment and Management '[]'  - 0 Ostomy Care Assessment and Management (repouching, etc.) PROCESS - Coordination of Care '[]'  - 0 Simple Patient / Family Education for ongoing care '[]'  - 0 Complex (extensive) Patient / Family Education for ongoing care '[]'  - 0 Staff obtains CProgrammer, systems Records, T Results / Process Orders est '[]'  - 0 Staff telephones HHA, Nursing Homes / Clarify orders / etc '[]'  - 0 Routine Transfer to another Facility (non-emergent condition) '[]'  - 0 Routine Hospital Admission (non-emergent condition) '[]'  - 0 New Admissions / IBiomedical engineer/ Ordering NPWT Apligraf, etc. , '[]'  - 0 Emergency Hospital Admission (emergent condition) PROCESS - Special Needs '[]'  - 0 Pediatric / Minor Patient Management '[]'  - 0 Isolation Patient Management '[]'  - 0 Hearing / Language / Visual special needs '[]'  - 0 Assessment of Community assistance (transportation, D/C planning, etc.) '[]'  - 0 Additional assistance / Altered mentation '[]'  - 0 Support Surface(s) Assessment (bed, cushion, seat, etc.) INTERVENTIONS - Miscellaneous '[]'  - 0 External ear exam '[]'  - 0 Patient Transfer (multiple staff / HCivil Service fast streamer/ Similar devices) '[]'  - 0 Simple Staple / Suture removal (25 or less) '[]'  - 0 Complex Staple / Suture removal (26 or more) '[]'  - 0 Hypo/Hyperglycemic Management (do not check if billed separately) '[]'  - 0 Ankle / Brachial Index (ABI) - do not check if billed separately Has the patient been seen at the hospital within the last three years: Yes Total  Score: 0 Level Of Care: ____ Electronic Signature(s) Signed: 09/30/2022 11:24:03 AM By: VMassie KluverEntered By: VMassie Kluveron  09/22/2022 12:14:23 -------------------------------------------------------------------------------- Encounter Discharge Information Details Patient Name: Date of Service: [name], [name] 09/22/2022 11:15 A M Medical Record Number: [id number] Patient Account Number: [id number] Date of Birth/Sex: Treating RN: Mar 24, 1941 (81 y.o. Orvan Falconer Primary Care Philippa Vessey: [name] Other Clinician: Massie Kluver Referring Jagar Lua: Treating Alizabeth Antonio/Extender: Laural Roes in Treatment: 4 Encounter Discharge Information Items Post Procedure Vitals Discharge Condition: Stable Temperature (F): 97.8 [name], [name] ([id number]) [id number] Page 3 of 9 Ambulatory Status: Ambulatory Pulse (bpm): 74 Discharge Destination: Home Respiratory Rate (breaths/min): 18 Transportation: Private Auto Blood Pressure (mmHg): 170/91 Accompanied By: daughter Schedule Follow-up Appointment: Yes Clinical Summary of Care: Electronic Signature(s) Signed: 09/30/2022 11:24:03 AM By: Massie Kluver Entered By: Massie Kluver on 09/22/2022 12:25:05 -------------------------------------------------------------------------------- Lower Extremity Assessment Details Patient Name: Date of Service: [name], [name] 09/22/2022 11:15 A M Medical Record Number: [id number] Patient Account Number: [id number] Date of Birth/Sex: Treating RN: [birth year] (81 y.o. Orvan Falconer Primary Care Johnel Yielding: [name] Other Clinician: Massie Kluver Referring Chaneka Trefz: Treating Angelmarie Ponzo/Extender: Laural Roes in Treatment: 4 Edema Assessment Assessed: [name]: No] Patrice Paradise: Yes] Edema: [Left: Ye] [Right: s] Calf Left: Right: Point of Measurement: 30 cm From Medial Instep 33.4 cm Ankle Left: Right: Point of  Measurement: 10 cm From Medial Instep 22 cm Vascular Assessment Pulses: Dorsalis Pedis Palpable: [Right:Yes] Electronic Signature(s) Signed: 09/24/2022 12:25:01 PM By: Carlene Coria RN Signed: 09/30/2022 11:24:03 AM By: Massie Kluver Entered By: Massie Kluver on 09/22/2022 11:55:35 Woodring, Phineas Semen [name] ([id number]) [id number] Page 4 of 9 -------------------------------------------------------------------------------- Multi Wound Chart Details Patient Name: Date of Service: [name], [name] 09/22/2022 11:15 A M Medical Record Number: [id number] Patient Account Number: [id number] Date of Birth/Sex: Treating RN: 29-Aug-1941 (81 y.o. 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 Etiology: Extremity Hypertension, Type II Diabetes, N/A N/A Comorbid History: Osteoarthritis, Neuropathy, Received Chemotherapy, Received Radiation 06/27/2022 N/A N/A Date Acquired: 4 N/A N/A Weeks of Treatment: Open N/A N/A Wound Status: No N/A N/A Wound Recurrence: 3.4x0.7x1.9 N/A N/A Measurements L x W x D (cm) 1.869 N/A N/A A (cm) : rea 3.552 N/A N/A Volume (cm) : 71.70% N/A N/A % Reduction in A rea: 66.40% N/A N/A % Reduction in Volume: Unable to visualize wound bed N/A N/A Classification: Medium N/A N/A Exudate A mount: Serous N/A N/A Exudate Type: amber N/A N/A Exudate Color: Thickened N/A N/A Wound Margin: Small (1-33%) N/A N/A Granulation A mount: Red N/A N/A Granulation  Quality: Large (67-100%) N/A N/A Necrotic A mount: Eschar, Adherent Slough N/A N/A Necrotic Tissue: Fat Layer (Subcutaneous Tissue): Yes N/A N/A Exposed Structures: Fascia: No Tendon: No Muscle: No Joint: No Bone: No Treatment Notes Electronic Signature(s) Signed: 09/30/2022 11:24:03 AM By: Massie Kluver Entered By: Massie Kluver on 09/22/2022 11:55:46 Veale, Phineas Semen [name] ([id number]) [id number] Page 5 of 9 -------------------------------------------------------------------------------- Multi-Disciplinary Care Plan Details Patient Name: Date of Service: [name], [name] 09/22/2022 11:15 A M Medical Record Number: [id number] Patient Account Number: [id number] Date of Birth/Sex: Treating RN: 11-Apr-1941 (81 y.o. Orvan Falconer Primary Care Alima Naser: [name] Other Clinician: Massie Kluver Referring Lanayah Gartley: Treating Aceyn Kathol/Extender: Laural Roes in  Treatment: 4 Active Inactive Necrotic Tissue Nursing Diagnoses: Impaired tissue integrity related to necrotic/devitalized tissue Knowledge deficit related to management of necrotic/devitalized tissue Goals: Necrotic/devitalized tissue will be minimized in the wound bed Date Initiated: 08/25/2022 Target Resolution Date: 08/25/2022 Goal Status: Active Patient/caregiver will verbalize understanding of reason and process for debridement of necrotic tissue Date Initiated: 08/25/2022 Target Resolution Date: 08/25/2022 Goal Status: Active Interventions: Assess patient pain level pre-, during and post procedure and prior to discharge Provide education on necrotic tissue and debridement process Treatment Activities: Excisional debridement : 08/25/2022 Notes: Orientation to the Wound Care Program Nursing Diagnoses: Knowledge deficit related to the wound healing center program Goals: Patient/caregiver will verbalize understanding of the [facility] Date  Initiated: 08/25/2022 Target Resolution Date: 08/25/2022 Goal Status: Active Interventions: Provide education on orientation to the wound center Notes: Pain, Acute or Chronic Nursing Diagnoses: Pain Management - Cyclic Acute (Dressing Change Related) Pain, acute or chronic: actual or potential Potential alteration in comfort, pain Goals: Patient will verbalize adequate pain control and receive pain control interventions during procedures as needed Date Initiated: 08/25/2022 Target Resolution Date: 08/25/2022 Goal Status: Active Patient/caregiver will verbalize adequate pain control between visits Date Initiated: 08/25/2022 Target Resolution Date: 08/25/2022 Goal Status: Active [name], [name] ([id number]) [id number] Page 6 of 9 Patient/caregiver will verbalize comfort level met Date Initiated: 08/25/2022 Target Resolution Date: 08/25/2022 Goal Status: Active Interventions: Provide education on pain management Reposition patient for comfort Treatment Activities: Administer pain control measures as ordered : 08/25/2022 Notes: Peripheral Neuropathy Nursing Diagnoses: Knowledge deficit related to disease process and management of peripheral neurovascular dysfunction Potential alteration in peripheral tissue perfusion (select prior to confirmation of diagnosis) Goals: Patient/caregiver will verbalize understanding of disease process and disease management Date Initiated: 08/25/2022 Target Resolution Date: 08/25/2022 Goal Status: Active Interventions: Assess signs and symptoms of neuropathy upon admission and as needed Provide education on Management of Neuropathy and Related Ulcers Notes: Wound/Skin Impairment Nursing Diagnoses: Impaired tissue integrity Knowledge deficit related to smoking impact on wound healing Knowledge deficit related to ulceration/compromised skin integrity Goals: Patient/caregiver will verbalize understanding of skin care  regimen Date Initiated: 08/25/2022 Target Resolution Date: 08/25/2022 Goal Status: Active Ulcer/skin breakdown will have a volume reduction of 30% by week 4 Date Initiated: 08/25/2022 Target Resolution Date: 09/22/2022 Goal Status: Active Interventions: Assess ulceration(s) every visit Provide education on smoking Notes: Electronic Signature(s) Signed: 09/24/2022 12:25:01 PM By: Carlene Coria RN Signed: 09/30/2022 11:24:03 AM By: Massie Kluver Entered By: Massie Kluver on 09/22/2022 11:55:40 -------------------------------------------------------------------------------- Pain Assessment Details Patient Name: Date of Service: [name] Puna [name]. 09/22/2022 11:15 A M Medical Record Number: [id number] Patient Account Number: [id number] Date of Birth/Sex: Treating RN: 10/23/1941 (81 y.o. Orvan Falconer Primary Care Teagyn Fishel: [name] Other Clinician: Massie Kluver Referring Kolina Kube: Treating Ercelle Winkles/Extender: Atiya, Yera, Coralee Pesa ([id number]) [phone].pdf Page 7 of 9 Weeks in Treatment: 4 Active Problems Location of Pain Severity and Description of Pain Patient Has Paino No Site Locations Pain Management and Medication Current Pain Management: Electronic Signature(s) Signed: 09/24/2022 12:25:01 PM By: Carlene Coria RN Signed: 09/30/2022 11:24:03 AM By: Massie Kluver Entered By: Massie Kluver on 09/22/2022 11:47:14 -------------------------------------------------------------------------------- Patient/Caregiver Education Details Patient Name: Date of Service: [name] 10/11/2023andnbsp11:15 A M Medical Record Number: [id number] Patient Account Number: [id number] Date of Birth/Gender: Treating RN: 05/13/1941 (81 y.o. Orvan Falconer Primary Care Physician: [name] Other Clinician: Massie Kluver Referring Physician: Treating Physician/Extender: Laural Roes in Treatment:  4  Education Assessment Education Provided To: Patient Education Topics Provided Wound/Skin Impairment: Handouts: Other: continue wound care as directed Methods: Explain/Verbal Responses: State content correctly Electronic Signature(s) Signed: 09/30/2022 11:24:03 AM By: Massie Kluver Entered By: Massie Kluver on 09/22/2022 12:14:53 [name] ([id number]) [id number] Page 8 of 9 -------------------------------------------------------------------------------- Wound Assessment Details Patient Name: Date of Service: [name], [name] 09/22/2022 11:15 A M Medical Record Number: [id number] Patient Account Number: [id number] Date of Birth/Sex: Treating RN: 08-Dec-1941 (81 y.o. Orvan Falconer Primary Care [place] Landgrebe: [name] Other Clinician: Massie Kluver Referring Quida Glasser: Treating Janaia Kozel/Extender: Laural Roes in Treatment: 4 Wound Status Wound Number: 1 Primary Diabetic Wound/Ulcer of the Lower Extremity Etiology: Wound Location: Right, Medial Lower Leg Wound Open Wounding Event: Trauma Status: Date Acquired: 06/27/2022 Comorbid Hypertension, Type II Diabetes, Osteoarthritis, Neuropathy, Weeks Of Treatment: 4 History: Received Chemotherapy, Received Radiation Clustered Wound: No Photos Wound Measurements Length: (cm) 3.4 Width: (cm) 0.7 Depth: (cm) 1.9 Area: (cm) 1.869 Volume: (cm) 3.552 % Reduction in Area: 71.7% % Reduction in Volume: 66.4% Wound Description Classification: Unable to visualize wound bed Wound Margin: Thickened Exudate Amount: Medium Exudate Type: Serous Exudate Color: amber Foul Odor After Cleansing: No Slough/Fibrino Yes Wound Bed Granulation Amount: Small (1-33%) Exposed Structure Granulation Quality: Red Fascia Exposed: No Necrotic Amount: Large (67-100%) Fat Layer (Subcutaneous Tissue) Exposed: Yes Necrotic Quality: Eschar, Adherent Slough Tendon Exposed: No Muscle  Exposed: No Joint Exposed: No Bone Exposed: No Electronic Signature(s) Signed: 09/24/2022 12:25:01 PM By: Carlene Coria RN Signed: 09/30/2022 11:24:03 AM By: Massie Kluver Entered By: Massie Kluver on 09/22/2022 11:54:41 Neffs, Blanch [name] ([id number]) [id number] Page 9 of 9 -------------------------------------------------------------------------------- Vitals Details Patient Name: Date of Service: SOMALY, MARTENEY 09/22/2022 11:15 A M Medical Record Number: [id number] Patient Account Number: [id number] Date of Birth/Sex: Treating RN: Sep 27, 1941 (81 y.o. Orvan Falconer Primary Care Jamis Kryder: [name] Other Clinician: Massie Kluver Referring Anthea Udovich: Treating Branko Steeves/Extender: Laural Roes in Treatment: 4 Vital Signs Time Taken: 11:45 Temperature (F): 97.8 Height (in): 65 Pulse (bpm): 74 Weight (lbs): 172 Respiratory Rate (breaths/min): 18 Body Mass Index (BMI): 28.6 Blood Pressure (mmHg): 170/91 Reference Range: 80 - 120 mg / dl Electronic Signature(s) Signed: 09/30/2022 11:24:03 AM By: Massie Kluver Entered By: Massie Kluver on 09/22/2022 11:46:58

## 2022-09-30 NOTE — Progress Notes (Signed)
SONTEE, DESENA ([id number]) [id number] Page 1 of 7 Visit Report for 09/22/2022 Chief Complaint Document Details Patient Name: Date of Service: [name], [name] 09/22/2022 11:15 A M Medical Record Number: [id number] Patient Account Number: [id number] Date of Birth/Sex: Treating RN: [birth year] (81 y.o. Orvan Falconer Primary Care Provider: [name] Other Clinician: Massie Kluver Referring Provider: Treating Provider/Extender: Laural Roes in Treatment: 4 Information Obtained from: Patient Chief Complaint 08/25/2022; right lower extremity wounds secondary to traumatic hematoma Electronic Signature(s) Signed: 09/22/2022 3:09:38 PM By: Kalman Shan DO Entered By: Kalman Shan on 09/22/2022 12:17:44 -------------------------------------------------------------------------------- Debridement Details Patient Name: Date of Service: [name] 09/22/2022 11:15 A M Medical Record Number: [id number] Patient Account Number: [id number] Date of Birth/Sex: Treating RN: 05-19-1941 (81 y.o. Orvan Falconer Primary Care Provider: [name] Other Clinician: Massie Kluver Referring Provider: Treating Provider/Extender: Laural Roes in Treatment: 4 Debridement Performed for Assessment: Wound #1 Right,Medial Lower Leg Performed By: Physician Kalman Shan, MD Debridement Type: Debridement Severity of Tissue Pre Debridement: Fat layer exposed Level of Consciousness (Pre-procedure): Awake and Alert Pre-procedure Verification/Time Out Yes - 12:12 Taken: Start Time: 12:12 T Area Debrided (L x W): otal 3.4 (cm) x 0.7 (cm) = 2.38 (cm) Tissue and other material debrided: Non-Viable, Slough, Subcutaneous, Slough Level: Skin/Subcutaneous Tissue Debridement Description: Excisional Instrument: Curette Bleeding: Minimum Hemostasis Achieved: Pressure End Time: 12:14 Response to Treatment:  Procedure was tolerated well Level of Consciousness (Post- Awake and Alert procedure): [name], [name] ([id number]) [id number] Page 2 of 7 Post Debridement Measurements of Total Wound Length: (cm) 3.4 Width: (cm) 0.7 Depth: (cm) 1.9 Volume: (cm) 3.552 Character of Wound/Ulcer Post Debridement: Stable Severity of Tissue Post Debridement: Fat layer exposed Post Procedure Diagnosis Same as Pre-procedure Electronic Signature(s) Signed: 09/22/2022 3:09:38 PM By: Kalman Shan DO Signed: 09/24/2022 12:25:01 PM By: Carlene Coria RN Signed: 09/30/2022 11:24:03 AM By: Massie Kluver Entered By: Massie Kluver on 09/22/2022 12:14:07 -------------------------------------------------------------------------------- HPI Details Patient Name: Date of Service: [name] 09/22/2022 11:15 A M Medical Record Number: [id number] Patient Account Number: [id number] Date of Birth/Sex: Treating RN: 1940/12/18 (81 y.o. Orvan Falconer Primary Care Provider: [name] Other Clinician: Massie Kluver Referring Provider: Treating Provider/Extender: Laural Roes in Treatment: 4 History of Present Illness HPI Description: Admission 08/25/2022 [name] is an 81 year old female with a past medical history of breast cancer, with insulin dependent, controlled type 2 diabetes, chronic venous insufficiency that presents to the clinic for a 59-monthhistory of nonhealing ulcer to the right lower extremity. She states she hit her leg against a stair and developed a hematoma. The area was evacuated by her primary care physician. She has tried Neosporin and wet-to-dry dressings. She reports mild chronic pain to the area. She denies increased warmth, erythema or purulent drainage. 9/20; patient presents for follow-up. She had an x-ray of the right leg done at last clinic visit that did not show Evidence of osteomyelitis. She has been  doing Dakin's wet-to-dry dressings without issues. She denies signs of infection. 9/27; patient presents for follow-up. Hydrofera Blue with antibiotic ointment was used under Kerlix/Coban at last clinic visit. She tolerated this well. She has no issues or complaints today. She denies signs of infection. 10/4; patient presents for follow-up. We have been using Hydrofera Blue to the wound bed all under Kerlix/Coban. She has no issues or complaints today. She has tolerated this well. 10/11; patient presents for follow-up. We have been using  Hydrofera Blue to the wound bed under Kerlix/Coban. She has tolerated this well and has no issues or complaints. Electronic Signature(s) Signed: 09/22/2022 3:09:38 PM By: Kalman Shan DO Entered By: Kalman Shan on 09/22/2022 12:18:20 [name] ([id number]) [id number] Page 3 of 7 -------------------------------------------------------------------------------- Physical Exam Details Patient Name: Date of Service: [name] 09/22/2022 11:15 A M Medical Record Number: [id number] Patient Account Number: [id number] Date of Birth/Sex: Treating RN: Jan 19, 1941 (81 y.o. Orvan Falconer Primary Care Provider: [name] Other Clinician: Massie Kluver Referring Provider: Treating Provider/Extender: Laural Roes in Treatment: 4 Constitutional . Cardiovascular . Psychiatric . Notes Right lower extremity: T the distal medial aspect there is an open wound with granulation tissue and nonviable tissue With 2 tunnels of increased depth. No o surrounding soft tissue infection including increased warmth, erythema or purulent drainage. Venous stasis dermatitis. Good edema control. Electronic Signature(s) Signed: 09/22/2022 3:09:38 PM By: Kalman Shan DO Entered By: Kalman Shan on 09/22/2022  12:18:36 -------------------------------------------------------------------------------- Physician Orders Details Patient Name: Date of Service: [name] 09/22/2022 11:15 A M Medical Record Number: [id number] Patient Account Number: [id number] Date of Birth/Sex: Treating RN: 01-28-1941 (81 y.o. Orvan Falconer Primary Care Provider: [name] Other Clinician: Massie Kluver Referring Provider: Treating Provider/Extender: Laural Roes in Treatment: 4 Verbal / Phone Orders: No Diagnosis Coding Follow-up Appointments Wound #1 Right,Medial Lower Leg Return Appointment in 1 week. Bathing/ Shower/ Hygiene May shower with wound dressing protected with water repellent cover or cast protector. No tub bath. Edema Control - Lymphedema / Segmental Compressive Device / Other Tubigrip single layer applied. - Tubi D Other: - kerlix and coban right lower leg ELLSIE, VIOLETTE B ([id number]) [id number] Page 4 of 7 Wound Treatment Wound #1 - Lower Leg Wound Laterality: Right, Medial Cleanser: Dakin 16 (oz) 0.25 1 x Per Week/30 Days Discharge Instructions: Use as directed. Topical: Santyl Collagenase Ointment, 30 (gm), tube 1 x Per Week/30 Days Discharge Instructions: apply nickel thick to wound bed only Prim Dressing: Hydrofera Blue Ready Transfer Foam, 4x5 (in/in) 1 x Per Week/30 Days ary Discharge Instructions: Apply Hydrofera Blue Ready to wound bed as directed Prim Dressing: Hydrofera Blue Classic Foam Rope Dressing, 9x6 (mm/in) ary 1 x Per Week/30 Days Discharge Instructions: lightly pack into wounds Secondary Dressing: Zetuvit Plus 4x8 (in/in) 1 x Per Week/30 Days Secured With: Coban Cohesive Bandage 4x5 (yds) Stretched 1 x Per Week/30 Days Discharge Instructions: Apply Coban as directed. Secured With: [employer] or Non-Sterile 6-ply 4.5x4 (yd/yd) 1 x Per Week/30 Days Discharge Instructions: Apply Kerlix as  directed Electronic Signature(s) Signed: 09/22/2022 3:09:38 PM By: Kalman Shan DO Entered By: Kalman Shan on 09/22/2022 12:19:31 -------------------------------------------------------------------------------- Problem List Details Patient Name: Date of Service: [name] 09/22/2022 11:15 A M Medical Record Number: [id number] Patient Account Number: [id number] Date of Birth/Sex: Treating RN: [birth year] (81 y.o. Orvan Falconer Primary Care Provider: [name] Other Clinician: Massie Kluver Referring Provider: Treating Provider/Extender: Laural Roes in Treatment: 4 Active Problems ICD-10 Encounter Code Description Active Date MDM Diagnosis L97.818 Non-pressure chronic ulcer of other part of right lower leg with other specified 08/25/2022 No Yes severity T79.8XXA Other early complications of trauma, initial encounter 08/25/2022 No Yes T79.2XXA Traumatic secondary and recurrent hemorrhage and seroma, initial encounter 08/25/2022 No Yes E11.622 Type 2 diabetes mellitus with other skin ulcer 08/25/2022 No Yes E03.9 Hypothyroidism, unspecified 08/25/2022 No Yes NAIROBI, GUSTAFSON B ([id number]) [id number] Page 5 of 7  I10 Essential (primary) hypertension 08/25/2022 No Yes I87.311 Chronic venous hypertension (idiopathic) with ulcer of right lower extremity 08/25/2022 No Yes Inactive Problems Resolved Problems Electronic Signature(s) Signed: 09/22/2022 3:09:38 PM By: Kalman Shan DO Entered By: Kalman Shan on 09/22/2022 12:17:22 -------------------------------------------------------------------------------- Progress Note Details Patient Name: Date of Service: [name] 09/22/2022 11:15 A M Medical Record Number: [id number] Patient Account Number: [id number] Date of Birth/Sex: Treating RN: 04/13/1941 (81 y.o. Orvan Falconer Primary Care Provider: [name] Other Clinician: Massie Kluver Referring  Provider: Treating Provider/Extender: Laural Roes in Treatment: 4 Subjective Chief Complaint Information obtained from Patient 08/25/2022; right lower extremity wounds secondary to traumatic hematoma History of Present Illness (HPI) Admission 08/25/2022 Ms. Mykeria Garman is an 81 year old female with a past medical history of breast cancer, with insulin dependent, controlled type 2 diabetes, chronic venous insufficiency that presents to the clinic for a 37-monthhistory of nonhealing ulcer to the right lower extremity. She states she hit her leg against a stair and developed a hematoma. The area was evacuated by her primary care physician. She has tried Neosporin and wet-to-dry dressings. She reports mild chronic pain to the area. She denies increased warmth, erythema or purulent drainage. 9/20; patient presents for follow-up. She had an x-ray of the right leg done at last clinic visit that did not show Evidence of osteomyelitis. She has been doing Dakin's wet-to-dry dressings without issues. She denies signs of infection. 9/27; patient presents for follow-up. Hydrofera Blue with antibiotic ointment was used under Kerlix/Coban at last clinic visit. She tolerated this well. She has no issues or complaints today. She denies signs of infection. 10/4; patient presents for follow-up. We have been using Hydrofera Blue to the wound bed all under Kerlix/Coban. She has no issues or complaints today. She has tolerated this well. 10/11; patient presents for follow-up. We have been using Hydrofera Blue to the wound bed under Kerlix/Coban. She has tolerated this well and has no issues or complaints. Objective Constitutional Vitals Time Taken: 11:45 AM, Height: 65 in, Weight: 172 lbs, BMI: 28.6, Temperature: 97.8 F, Pulse: 74 bpm, Respiratory Rate: 18 breaths/min, Blood [place]au, Cambrie B ([id number] Page 6 of 7 Pressure: 170/91  mmHg. General Notes: Right lower extremity: T the distal medial aspect there is an open wound with granulation tissue and nonviable tissue With 2 tunnels of o increased depth. No surrounding soft tissue infection including increased warmth, erythema or purulent drainage. Venous stasis dermatitis. Good edema control. Integumentary (Hair, Skin) Wound #1 status is Open. Original cause of wound was Trauma. The date acquired was: 06/27/2022. The wound has been in treatment 4 weeks. The wound is located on the Right,Medial Lower Leg. The wound measures 3.4cm length x 0.7cm width x 1.9cm depth; 1.869cm^2 area and 3.552cm^3 volume. There is Fat Layer (Subcutaneous Tissue) exposed. There is a medium amount of serous drainage noted. The wound margin is thickened. There is small (1-33%) red granulation within the wound bed. There is a large (67-100%) amount of necrotic tissue within the wound bed including Eschar and Adherent Slough. 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 I debrided nonviable  tissue. I recommended continue with Hydrofera Blue under Kerlix/Coban. Follow-up in 1 week. Procedures Wound #1 Pre-procedure diagnosis of Wound #1 is a Diabetic Wound/Ulcer of the Lower Extremity located on the Right,Medial Lower Leg .Severity of Tissue Pre Debridement is: Fat layer exposed. There was a Excisional Skin/Subcutaneous Tissue Debridement with a total area of 2.38 sq cm performed by Kalman Shan, MD. With the following  instrument(s): Curette to remove Non-Viable tissue/material. Material removed includes Subcutaneous Tissue and Slough and. A time out was conducted at 12:12, prior to the start of the procedure. A Minimum amount of bleeding was controlled with Pressure. The procedure was tolerated well. Post Debridement Measurements: 3.4cm length x 0.7cm width x 1.9cm depth; 3.552cm^3 volume. Character of Wound/Ulcer Post Debridement is stable. Severity of Tissue Post Debridement is: Fat layer exposed. Post procedure Diagnosis Wound #1: Same as Pre-Procedure Plan Follow-up Appointments: Wound #1 Right,Medial Lower Leg: Return Appointment in 1 week. Bathing/ Shower/ Hygiene: May shower with wound dressing protected with water repellent cover or cast protector. No tub bath. Edema Control - Lymphedema / Segmental Compressive Device / Other: Tubigrip single layer applied. - Tubi D Other: - kerlix and coban right lower leg WOUND #1: - Lower Leg Wound Laterality: Right, Medial Cleanser: Dakin 16 (oz) 0.25 1 x Per Week/30 Days Discharge Instructions: Use as directed. Topical: Santyl Collagenase Ointment, 30 (gm), tube 1 x Per Week/30 Days Discharge Instructions: apply nickel thick to wound bed only Prim Dressing: Hydrofera Blue Ready Transfer Foam, 4x5 (in/in) 1 x Per Week/30 Days ary Discharge Instructions: Apply Hydrofera Blue Ready to wound bed as directed Prim Dressing: Hydrofera Blue Classic Foam Rope Dressing, 9x6 (mm/in) 1 x Per Week/30 Days ary Discharge Instructions: lightly pack into wounds Secondary Dressing: Zetuvit Plus 4x8 (in/in) 1 x Per Week/30 Days Secured With: Coban Cohesive Bandage 4x5 (yds) Stretched 1 x Per Week/30 Days Discharge Instructions: Apply Coban as directed. Secured With: [employer] or Non-Sterile 6-ply 4.5x4 (yd/yd) 1 x Per Week/30 Days Discharge Instructions: Apply Kerlix as directed 1. In office sharp debridement 2. Hydrofera Blue 3. CASSEY, BACIGALUPO ([id number]) [id number] Page 7 of 7 Electronic Signature(s) Signed: 09/22/2022 3:09:38 PM By: Kalman Shan DO Entered By: Kalman Shan on 09/22/2022 12:19:09 -------------------------------------------------------------------------------- SuperBill Details Patient Name: Date of Service: [name] 09/22/2022 Medical Record Number: [id number] Patient Account Number: [id number] Date of Birth/Sex: Treating RN: 03-13-1941 (81 y.o. 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

## 2022-09-30 NOTE — Progress Notes (Signed)
[name], [name] ([id number]) [id number] Page 1 of 9 Visit Report for 09/29/2022 Arrival Information Details Patient Name: Date of Service: [name], [name] 09/29/2022 2:15 PM Medical Record Number: [id number] Patient Account Number: [id number] Date of Birth/Sex: Treating RN: 08-15-1941 (81 y.o. [name] Primary Care [name]: [name] Other Clinician: Massie [name] Referring [name]: Treating [name]/Extender: Laural [name] in Treatment: 5 Visit Information History Since Last Visit All ordered tests and consults were completed: No Patient Arrived: [name] Added or deleted any medications: No Arrival Time: 14:44 Any new allergies or adverse reactions: No Transfer Assistance: None Had a fall or experienced change in No Patient Requires Transmission-Based Precautions: No activities of daily living that may affect Patient Has Alerts: Yes risk of falls: Patient Alerts: Patient on Blood Thinner Hospitalized since last visit: No 69m aspirin Pain Present Now: No Type II Diabetic Electronic Signature(s) Signed: 09/30/2022 11:23:10 AM By: VMassie KluverEntered By: VMassie Kluveron 09/29/2022 14:45:53 -------------------------------------------------------------------------------- Clinic Level of Care Assessment Details Patient Name: Date of Service: [name], [name]10/18/2023 2:15 PM Medical Record Number: [id number] Account Number: [id number] of Birth/Sex: Treating RN: 6May 19, 1942(81y.o. FMarlowe ShoresPrimary Care Lilburn Straw: ZMia CreekOther Clinician: VMassie KluverReferring Denisha Hoel: Treating Priscilla Kirstein/Extender: HLaural Roesin Treatment: 5 Clinic Level of Care Assessment Items TOOL 1 Quantity Score _0  - 0 Use when EandM and Procedure is performed on INITIAL visit ASSESSMENTS - Nursing Assessment / Reassessment _1  - 0 General Physical Exam (combine w/ comprehensive  assessment (listed just below) when performed on new pt. evals) _2  - 0 Comprehensive Assessment (HX, ROS, Risk Assessments, Wounds Hx, etc.) ASSESSMENTS - Wound and Skin Assessment / Reassessment _3  - 0 Dermatologic / Skin Assessment (not related to wound area) ASSESSMENTS - Ostomy and/or Continence Assessment and Care [name] ([id number] Page 2 of 9 _4  - 0 Incontinence Assessment and Management _5  - 0 Ostomy Care Assessment and Management (repouching, etc.) PROCESS - Coordination of Care _6  - 0 Simple Patient / Family Education for ongoing care _7  - 0 Complex (extensive) Patient / Family Education for ongoing care _8  - 0 Staff obtains CProgrammer, systems Records, T Results / Process Orders est _9  - 0 Staff telephones HHA, Nursing Homes / Clarify orders / etc _10  - 0 Routine Transfer to another Facility (non-emergent condition) _11  - 0 Routine Hospital Admission (non-emergent condition) _12  - 0 New Admissions / IBiomedical engineer/ Ordering NPWT Apligraf, etc. , _13  - 0 Emergency Hospital Admission (emergent condition) PROCESS - Special Needs _14  - 0 Pediatric / Minor Patient Management _15  - 0 Isolation Patient Management _16  - 0 Hearing / Language / Visual special needs _17  - 0 Assessment of Community assistance (transportation, D/C planning, etc.) _18  - 0 Additional assistance / Altered mentation _19  - 0 Support Surface(s) Assessment (bed, cushion, seat, etc.) INTERVENTIONS - Miscellaneous _20  - 0 External ear exam _21  - 0 Patient Transfer (multiple staff / HCivil Service fast streamer/ Similar devices) _22  - 0 Simple Staple / Suture removal (25 or less) _23  - 0 Complex Staple / Suture removal (26 or more) _24  - 0 Hypo/Hyperglycemic Management (do not check if billed separately) _25  - 0 Ankle / Brachial Index (ABI) - do not check if billed separately Has the patient been seen at the hospital within the last three years: Yes Total Score: 0 Level  Of Care: ____ Electronic Signature(s) Signed: 09/30/2022 11:23:10 AM By: VMassie KluverEntered By: VMassie Kluveron 09/29/2022 15:08:19 --------------------------------------------------------------------------------  Encounter Discharge Information Details Patient Name: Date of Service: [name], [name] 09/29/2022 2:15 PM Medical Record Number: [id number] Patient Account Number: [id number] Date of Birth/Sex: Treating RN: 05/23/1941 (81 y.o. [name] Primary Care Najla Aughenbaugh: [name] Other Clinician: Massie [name] Referring Daanya Lanphier: Treating Meena Barrantes/Extender: Laural [name] in Treatment: 5 Encounter Discharge Information Items Post Procedure Vitals Discharge Condition: Stable Temperature (F): 97.7 [name] [name] ([id number]) [id number] Page 3 of 9 Ambulatory Status: Walker Pulse (bpm): 89 Discharge Destination: Home Respiratory Rate (breaths/min): 18 Transportation: Private Auto Blood Pressure (mmHg): 144/81 Accompanied By: daughter Schedule Follow-up Appointment: Yes Clinical Summary of Care: Electronic Signature(s) Signed: 09/30/2022 11:23:10 AM By: [name] Entered By: [name] on 09/29/2022 17:09:40 -------------------------------------------------------------------------------- Lower Extremity Assessment Details Patient Name: Date of Service: [name], [name] 09/29/2022 2:15 PM Medical Record Number: [id number] Patient Account Number: [id number] Date of Birth/Sex: Treating RN: May 31, 1941 (81 y.o. [name] Primary Care Guillermo Difrancesco: [name] Other Clinician: Massie [name] Referring Abriel Hattery: Treating Kinan Safley/Extender: Laural [name] in Treatment: 5 Edema Assessment Assessed: [name]: No] Patrice Paradise: Yes] [Left: Edema] [Right: :] Calf Left: Right: Point of Measurement: 30 cm From Medial Instep 32.6 cm Ankle Left: Right: Point of Measurement: 10 cm From Medial  Instep 22 cm Vascular Assessment Pulses: Dorsalis Pedis Palpable: [Right:Yes] Electronic Signature(s) Signed: 09/29/2022 3:41:25 PM By: Gretta Cool, BSN, RN, CWS, Kim RN, BSN Signed: 09/30/2022 11:23:10 AM By: [name] Entered By: [name] on 09/29/2022 14:59:19 Whitter, Siarah [name] ([id number]) [id number] Page 4 of 9 -------------------------------------------------------------------------------- Multi Wound Chart Details Patient Name: Date of Service: [name], [name] 09/29/2022 2:15 PM Medical Record Number: [id number] Patient Account Number: [id number] Date of Birth/Sex: Treating RN: 1941/05/14 (81 y.o. [name] Primary Care Demetry Bendickson: [name] Other Clinician: Massie [name] Referring Delvecchio Madole: Treating Jewel Mcafee/Extender: Laural [name] in Treatment: 5 Vital Signs Height(in): 65 Pulse(bpm): 89 Weight(lbs): 172 Blood Pressure(mmHg): 144/81 Body Mass Index(BMI): 28.6 Temperature(F): 97.7 Respiratory Rate(breaths/min): 18 [1:Photos:] [N/A:N/A] Right, Medial Lower Leg N/A N/A Wound Location: Trauma N/A N/A Wounding Event: Diabetic Wound/Ulcer of the Lower N/A N/A Primary Etiology: Extremity Hypertension, Type II Diabetes, N/A N/A Comorbid History: Osteoarthritis, Neuropathy, Received Chemotherapy, Received Radiation 06/27/2022 N/A N/A Date Acquired: 5 N/A N/A Weeks of Treatment: Open N/A N/A Wound Status: No N/A N/A Wound Recurrence: 3.4x0.7x2.5 N/A N/A Measurements L x W x D (cm) 1.869 N/A N/A A (cm) : rea 4.673 N/A N/A Volume (cm) : 71.70% N/A N/A % Reduction in A rea: 55.70% N/A N/A % Reduction in Volume: Grade 2 N/A N/A Classification: Medium N/A N/A Exudate A mount: Serosanguineous N/A N/A Exudate Type: red, brown N/A N/A Exudate Color: Thickened N/A N/A Wound Margin: Small (1-33%) N/A N/A Granulation A mount: Red N/A N/A Granulation Quality: Large (67-100%) N/A  N/A Necrotic A mount: Eschar, Adherent Slough N/A N/A Necrotic Tissue: Fat Layer (Subcutaneous Tissue): Yes N/A N/A Exposed Structures: Fascia: No Tendon: No Muscle: No Joint: No Bone: No None N/A N/A Epithelialization: Treatment Notes Electronic Signature(s) Signed: 09/30/2022 11:23:10 AM By: [name] Entered By: [name] on 09/29/2022 14:59:29 Caccamo, Anuradha [name] ([id number]) [id number] Page 5 of 9 -------------------------------------------------------------------------------- Multi-Disciplinary Care Plan Details Patient Name: Date of Service: [name], [name] 09/29/2022 2:15 PM Medical Record Number: [id number] Patient Account Number: [id number] Date of Birth/Sex: Treating RN: 1941-09-04 (81 y.o. [name] Primary Care Jeramey Lanuza: [name] Other Clinician: Massie [name] Referring Trueman Worlds: Treating Geri Hepler/Extender: Laural [name] in Treatment: 5  Active Inactive Necrotic Tissue Nursing Diagnoses: Impaired tissue integrity related to necrotic/devitalized tissue Knowledge deficit related to management of necrotic/devitalized tissue Goals: Necrotic/devitalized tissue will be minimized in the wound bed Date Initiated: 08/25/2022 Target Resolution Date: 08/25/2022 Goal Status: Active Patient/caregiver will verbalize understanding of reason and process for debridement of necrotic tissue Date Initiated: 08/25/2022 Target Resolution Date: 08/25/2022 Goal Status: Active Interventions: Assess patient pain level pre-, during and post procedure and prior to discharge Provide education on necrotic tissue and debridement process Treatment Activities: Excisional debridement : 08/25/2022 Notes: Orientation to the Wound Care Program Nursing Diagnoses: Knowledge deficit related to the wound healing center program Goals: Patient/caregiver will verbalize understanding of the [facility] Date  Initiated: 08/25/2022 Target Resolution Date: 08/25/2022 Goal Status: Active Interventions: Provide education on orientation to the wound center Notes: Pain, Acute or Chronic Nursing Diagnoses: Pain Management - Cyclic Acute (Dressing Change Related) Pain, acute or chronic: actual or potential Potential alteration in comfort, pain Goals: Patient will verbalize adequate pain control and receive pain control interventions during procedures as needed Date Initiated: 08/25/2022 Target Resolution Date: 08/25/2022 Goal Status: Active Patient/caregiver will verbalize adequate pain control between visits Date Initiated: 08/25/2022 Target Resolution Date: 08/25/2022 Goal Status: Active TIYA, SCHRUPP ([id number]) [id number] Page 6 of 9 Patient/caregiver will verbalize comfort level met Date Initiated: 08/25/2022 Target Resolution Date: 08/25/2022 Goal Status: Active Interventions: Provide education on pain management Reposition patient for comfort Treatment Activities: Administer pain control measures as ordered : 08/25/2022 Notes: Peripheral Neuropathy Nursing Diagnoses: Knowledge deficit related to disease process and management of peripheral neurovascular dysfunction Potential alteration in peripheral tissue perfusion (select prior to confirmation of diagnosis) Goals: Patient/caregiver will verbalize understanding of disease process and disease management Date Initiated: 08/25/2022 Target Resolution Date: 08/25/2022 Goal Status: Active Interventions: Assess signs and symptoms of neuropathy upon admission and as needed Provide education on Management of Neuropathy and Related Ulcers Notes: Wound/Skin Impairment Nursing Diagnoses: Impaired tissue integrity Knowledge deficit related to smoking impact on wound healing Knowledge deficit related to ulceration/compromised skin integrity Goals: Patient/caregiver will verbalize understanding of skin care  regimen Date Initiated: 08/25/2022 Target Resolution Date: 08/25/2022 Goal Status: Active Ulcer/skin breakdown will have a volume reduction of 30% by week 4 Date Initiated: 08/25/2022 Target Resolution Date: 09/22/2022 Goal Status: Active Interventions: Assess ulceration(s) every visit Provide education on smoking Notes: Electronic Signature(s) Signed: 09/29/2022 3:41:25 PM By: Gretta Cool, BSN, RN, CWS, Kim RN, BSN Signed: 09/30/2022 11:23:10 AM By: [name] Entered By: [name] on 09/29/2022 14:59:23 -------------------------------------------------------------------------------- Pain Assessment Details Patient Name: Date of Service: [name] 09/29/2022 2:15 PM Medical Record Number: [id number] Patient Account Number: [id number] Date of Birth/Sex: Treating RN: 1940-12-19 (81 y.o. [name] Primary Care Onelia Cadmus: [name] Other Clinician: Massie [name] Referring Tyris Eliot: Treating [name]/Extender: Darren, Nodal, Coralee Pesa ([id number]) [phone].pdf Page 7 of 9 Weeks in Treatment: 5 Active Problems Location of Pain Severity and Description of Pain Patient Has Paino No Site Locations Pain Management and Medication Current Pain Management: Electronic Signature(s) Signed: 09/29/2022 3:41:25 PM By: Gretta Cool, BSN, RN, CWS, Kim RN, BSN Signed: 09/30/2022 11:23:10 AM By: [name] Entered By: [name] on 09/29/2022 14:49:11 -------------------------------------------------------------------------------- Patient/Caregiver Education Details Patient Name: Date of Service: [name] 10/18/2023andnbsp2:15 PM Medical Record Number: [id number] Patient Account Number: [id number] Date of Birth/Gender: Treating RN: 06/24/1941 (81 y.o. [name] Primary Care Physician: [name] Other Clinician: Massie [name] Referring Physician: Treating Physician/Extender: Sherrill Raring  Weeks in Treatment: 5 Education Assessment Education Provided To: Patient and Caregiver Education Topics Provided Wound/Skin Impairment: Handouts: Other: continue wound care as directed Methods: Explain/Verbal Responses: State content correctly Electronic Signature(s) Signed: 09/30/2022 11:23:10 AM By: [name] Entered By: [name] on 09/29/2022 15:08:48 [name] ([id number]) [id number] Page 8 of 9 -------------------------------------------------------------------------------- Wound Assessment Details Patient Name: Date of Service: [name], [name] 09/29/2022 2:15 PM Medical Record Number: [id number] Patient Account Number: [id number] Date of Birth/Sex: Treating RN: July 30, 1941 (81 y.o. [name] Primary Care Aemon Koeller: [name] Other Clinician: Massie [name] Referring Aden Youngman: Treating Emeree Mahler/Extender: Laural [name] in Treatment: 5 Wound Status Wound Number: 1 Primary Diabetic Wound/Ulcer of the Lower Extremity Etiology: Wound Location: Right, Medial Lower Leg Wound Open Wounding Event: Trauma Status: Date Acquired: 06/27/2022 Comorbid Hypertension, Type II Diabetes, Osteoarthritis, Neuropathy, Weeks Of Treatment: 5 History: Received Chemotherapy, Received Radiation Clustered Wound: No Photos Wound Measurements Length: (cm) 3.4 Width: (cm) 0.7 Depth: (cm) 2.5 Area: (cm) 1. Volume: (cm) 4. % Reduction in Area: 71.7% % Reduction in Volume: 55.7% Epithelialization: None 869 Tunneling: No 673 Wound Description Classification: Grade 2 Wound Margin: Thickened Exudate Amount: Medium Exudate Type: Serosanguineous Exudate Color: red, brown Foul Odor After Cleansing: No Slough/Fibrino Yes Wound Bed Granulation Amount: Small (1-33%) Exposed Structure Granulation Quality: Red Fascia Exposed: No Necrotic Amount: Large (67-100%) Fat Layer (Subcutaneous Tissue) Exposed:  Yes Necrotic Quality: Eschar, Adherent Slough Tendon Exposed: No Muscle Exposed: No Joint Exposed: No Bone Exposed: No Treatment Notes Wound #1 (Lower Leg) Wound Laterality: Right, Medial Cleanser Dakin 16 (oz) 0.25 [name], [name] ([id number]) [id number] Page 9 of 9 Discharge Instruction: Use as directed. Peri-Wound Care Topical Santyl Collagenase Ointment, 30 (gm), tube Discharge Instruction: apply nickel thick to wound bed only Primary Dressing Hydrofera Blue Ready Transfer Foam, 4x5 (in/in) Discharge Instruction: Apply Hydrofera Blue Ready to wound bed as directed Hydrofera Blue Classic Foam Rope Dressing, 9x6 (mm/in) Discharge Instruction: lightly pack into wounds Secondary Dressing Zetuvit Plus 4x8 (in/in) Secured With Coban Cohesive Bandage 4x5 (yds) Stretched Discharge Instruction: Apply Coban as directed. Kerlix Roll Sterile or Non-Sterile 6-ply 4.5x4 (yd/yd) Discharge Instruction: Apply Kerlix as directed Compression Wrap Compression Stockings Add-Ons Electronic Signature(s) Signed: 09/29/2022 3:41:25 PM By: Gretta Cool, BSN, RN, CWS, Kim RN, BSN Signed: 09/30/2022 11:23:10 AM By: [name] Entered By: [name] on 09/29/2022 14:58:26 -------------------------------------------------------------------------------- [place] Details Patient Name: Date of Service: [name] Puna [name]. 09/29/2022 2:15 PM Medical Record Number: [id number] Patient Account Number: [id number] Date of Birth/Sex: Treating RN: September 04, 1941 (81 y.o. Charolette Forward, Kim Primary Care Kassey Laforest: [name] Other Clinician: Massie [name] Referring Sherene Plancarte: Treating Abdulrahim Siddiqi/Extender: Laural [name] in Treatment: 5 Vital Signs Time Taken: 02:46 Temperature (F): 97.7 Height (in): 65 Pulse (bpm): 89 Weight (lbs): 172 Respiratory Rate (breaths/min): 18 Body Mass Index (BMI): 28.6 Blood Pressure (mmHg): 144/81 Reference Range: 80 - 120 mg /  dl Electronic Signature(s) Signed: 09/30/2022 11:23:10 AM By: [name] Entered By: [name] on 09/29/2022 14:48:55

## 2022-10-06 ENCOUNTER — Encounter: Payer: Medicare PPO | Admitting: Internal Medicine

## 2022-10-06 DIAGNOSIS — E11622 Type 2 diabetes mellitus with other skin ulcer: Secondary | ICD-10-CM | POA: Diagnosis not present

## 2022-10-07 NOTE — Progress Notes (Signed)
GENEVIENE, TESCH ([id number]) [id number] Page 1 of 6 Visit Report for 10/06/2022 HPI Details Patient Name: Date of Service: [name], [name] 10/06/2022 2:15 PM Medical Record Number: [id number] Patient Account Number: [id number] Date of Birth/Sex: Treating RN: November 04, 1941 (81 y.o. [name] Primary Care Provider: [name] Other Clinician: Massie Kluver Referring Provider: Treating Provider/Extender: Eldridge Dace, [name] in Treatment: 6 History of Present Illness HPI Description: Admission 08/25/2022 [name] is an 81 year old female with a past medical history of breast cancer, with insulin dependent, controlled type 2 diabetes, chronic venous insufficiency that presents to the clinic for a 83-monthhistory of nonhealing ulcer to the right lower extremity. She states she hit her leg against a stair and developed a hematoma. The area was evacuated by her primary care physician. She has tried Neosporin and wet-to-dry dressings. She reports mild chronic pain to the area. She denies increased warmth, erythema or purulent drainage. 9/20; patient presents for follow-up. She had an x-ray of the right leg done at last clinic visit that did not show Evidence of osteomyelitis. She has been doing Dakin's wet-to-dry dressings without issues. She denies signs of infection. 9/27; patient presents for follow-up. Hydrofera Blue with antibiotic ointment was used under Kerlix/Coban at last clinic visit. She tolerated this well. She has no issues or complaints today. She denies signs of infection. 10/4; patient presents for follow-up. We have been using Hydrofera Blue to the wound bed all under Kerlix/Coban. She has no issues or complaints today. She has tolerated this well. 10/11; patient presents for follow-up. We have been using Hydrofera Blue to the wound bed under Kerlix/Coban. She has tolerated this well and has no issues or  complaints. 10/18; patient presents for follow-up. We have been using Hydrofera Blue with Santyl to the wound bed under Kerlix/Coban. She has no issues or complaints today. 10/25; this is a patient with a right lower extremity medial leg ulcer in the setting of fairly severe chronic venous insufficiency. She has been using Santyl Hydrofera Blue rope Hydrofera Blue under kerlix Coban. She presents with 3 tunneling areas in the wound. Electronic Signature(s) Signed: 10/06/2022 4:38:50 PM By: RLinton HamMD Entered By: RLinton Hamon 10/06/2022 15:37:53 -------------------------------------------------------------------------------- Physical Exam Details Patient Name: Date of Service: EJeannette How10/25/2023 2:15 PM Medical Record Number: [id number] Account Number: [id number] of Birth/Sex: Treating RN: [birth year](81y.o. FMarlowe ShoresPrimary Care Provider: ZMia CreekOther Clinician: VMassie KluverReferring Provider: Treating Provider/Extender: REldridge Dace MICHA EL GMadilyn Firemanin Treatment: [place]([id number] Page 2 of 6 Constitutional Patient is hypertensive.. Pulse regular and within target range for patient..Marland KitchenRespirations regular, non-labored and within target range.. Temperature is normal and within the target range for the patient..Marland Kitchenappears in no distress. Respiratory Respiratory effort is easy and symmetric bilaterally. Rate is normal at rest and on room air.. Cardiovascular Pedal pulses are palpable.. She has significant stasis dermatitis in the lower leg even around her wound. Above the level she has nonpitting edema in her lower leg.. Notes Wound exam; the wound itself looks clean. Minimal depth however she has 3 areas 1 superiorly 1 in the mid aspect and one inferiorly that have tunneling depth. The tissue in here looks healthy. There is no evidence of surrounding infection Electronic  Signature(s) Signed: 10/06/2022 4:38:50 PM By: RLinton HamMD Entered By: RLinton Hamon 10/06/2022 15:41:47 -------------------------------------------------------------------------------- Physician Orders Details Patient Name:  Date of Service: [name], RHEM 10/06/2022 2:15 PM Medical Record Number: [id number] Patient Account Number: [id number] Date of Birth/Sex: Treating RN: 10-Feb-1941 (81 y.o. [name] Primary Care Provider: [name] Other Clinician: Massie Kluver Referring Provider: Treating Provider/Extender: Eldridge Dace, [name] in Treatment: 6 Verbal / Phone Orders: No Diagnosis Coding Follow-up Appointments Wound #1 Right,Medial Lower Leg Return Appointment in 1 week. Bathing/ Shower/ Hygiene May shower with wound dressing protected with water repellent cover or cast protector. No tub bath. Edema Control - Lymphedema / Segmental Compressive Device / Other 3 Layer Compression System for Lymphedema. - right lower leg Elevate legs to the level of the heart and pump ankles as often as possible Wound Treatment Wound #1 - Lower Leg Wound Laterality: Right, Medial Cleanser: Dakin 16 (oz) 0.25 1 x Per Week/30 Days Discharge Instructions: Use as directed. Prim Dressing: Hydrofera Blue Ready Transfer Foam, 4x5 (in/in) 1 x Per Week/30 Days ary Discharge Instructions: Apply Hydrofera Blue Ready to wound bed as directed Prim Dressing: Hydrofera Blue Classic Foam Rope Dressing, 9x6 (mm/in) ary 1 x Per Week/30 Days Discharge Instructions: lightly pack into wounds Secondary Dressing: Zetuvit Plus 4x8 (in/in) 1 x Per Week/30 Days Compression Wrap: 3-LAYER WRAP - Profore Lite LF 3 Multilayer Compression Bandaging System 1 x Per Week/30 Days Discharge Instructions: Apply 3 multi-layer wrap as prescribed. [name], VO ([id number]) [id number] Page 3 of 6 Electronic Signature(s) Signed: 10/06/2022 4:38:50 PM By:  Linton Ham MD Signed: 10/07/2022 7:57:14 AM By: Massie Kluver Entered By: Massie Kluver on 10/06/2022 14:50:00 -------------------------------------------------------------------------------- Problem List Details Patient Name: Date of Service: [name] 10/06/2022 2:15 PM Medical Record Number: [id number] Patient Account Number: [id number] Date of Birth/Sex: Treating RN: 12-22-1940 (81 y.o. [name] Primary Care Provider: [name] Other Clinician: Massie Kluver Referring Provider: Treating Provider/Extender: Eldridge Dace, [name] in Treatment: 6 Active Problems ICD-10 Encounter Code Description Active Date MDM Diagnosis L97.818 Non-pressure chronic ulcer of other part of right lower leg with other specified 08/25/2022 No Yes severity T79.8XXA Other early complications of trauma, initial encounter 08/25/2022 No Yes T79.2XXA Traumatic secondary and recurrent hemorrhage and seroma, initial encounter 08/25/2022 No Yes E11.622 Type 2 diabetes mellitus with other skin ulcer 08/25/2022 No Yes E03.9 Hypothyroidism, unspecified 08/25/2022 No Yes I10 Essential (primary) hypertension 08/25/2022 No Yes I87.311 Chronic venous hypertension (idiopathic) with ulcer of right lower extremity 08/25/2022 No Yes Inactive Problems Resolved Problems Electronic Signature(s) Signed: 10/06/2022 4:38:50 PM By: Linton Ham MD Entered By: Linton Ham on 10/06/2022 15:37:05 [name] ([id number]) [id number] Page 4 of 6 -------------------------------------------------------------------------------- Progress Note Details Patient Name: Date of Service: [name], [name] 10/06/2022 2:15 PM Medical Record Number: [id number] Patient Account Number: [id number] Date of Birth/Sex: Treating RN: 10-13-1941 (81 y.o. [name] Primary Care Provider: [name] Other Clinician: Massie Kluver Referring Provider: Treating  Provider/Extender: RO BSO N, [place], Haynes Hoehn in Treatment: 6 Subjective History of Present Illness (HPI) Admission 08/25/2022 Ms. Arieanna Pressey is an 81 year old female with a past medical history of breast cancer, with insulin dependent, controlled type 2 diabetes, chronic venous insufficiency that presents to the clinic for a 84-monthhistory of nonhealing ulcer to the right lower extremity. She states she hit her leg against a stair and developed a hematoma. The area was evacuated by her primary care physician. She has tried Neosporin and wet-to-dry dressings. She reports mild chronic pain to the  area. She denies increased warmth, erythema or purulent drainage. 9/20; patient presents for follow-up. She had an x-ray of the right leg done at last clinic visit that did not show Evidence of osteomyelitis. She has been doing Dakin's wet-to-dry dressings without issues. She denies signs of infection. 9/27; patient presents for follow-up. Hydrofera Blue with antibiotic ointment was used under Kerlix/Coban at last clinic visit. She tolerated this well. She has no issues or complaints today. She denies signs of infection. 10/4; patient presents for follow-up. We have been using Hydrofera Blue to the wound bed all under Kerlix/Coban. She has no issues or complaints today. She has tolerated this well. 10/11; patient presents for follow-up. We have been using Hydrofera Blue to the wound bed under Kerlix/Coban. She has tolerated this well and has no issues or complaints. 10/18; patient presents for follow-up. We have been using Hydrofera Blue with Santyl to the wound bed under Kerlix/Coban. She has no issues or complaints today. 10/25; this is a patient with a right lower extremity medial leg ulcer in the setting of fairly severe chronic venous insufficiency. She has been using Santyl Hydrofera Blue rope Hydrofera Blue under kerlix Coban. She presents with 3 tunneling areas in the  wound. Objective Constitutional Patient is hypertensive.. Pulse regular and within target range for patient.Marland Kitchen Respirations regular, non-labored and within target range.. Temperature is normal and within the target range for the patient.Marland Kitchen appears in no distress. Vitals Time Taken: 2:24 PM, Height: 65 in, Weight: 172 lbs, BMI: 28.6, Temperature: 98.2 F, Pulse: 86 bpm, Respiratory Rate: 18 breaths/min, Blood Pressure: 172/72 mmHg. Respiratory Respiratory effort is easy and symmetric bilaterally. Rate is normal at rest and on room air.. Cardiovascular Pedal pulses are palpable.. She has significant stasis dermatitis in the lower leg even around her wound. Above the level she has nonpitting edema in her lower leg.. General Notes: Wound exam; the wound itself looks clean. Minimal depth however she has 3 areas 1 superiorly 1 in the mid aspect and one inferiorly that have tunneling depth. The tissue in here looks healthy. There is no evidence of surrounding infection Integumentary (Hair, Skin) Wound #1 status is Open. Original cause of wound was Trauma. The date acquired was: 06/27/2022. The wound has been in treatment 6 weeks. The wound is located on the Right,Medial Lower Leg. The wound measures 3.4cm length x 0.7cm width x 2.3cm depth; 1.869cm^2 area and 4.299cm^3 volume. There is Fat Layer (Subcutaneous Tissue) exposed. There is no undermining noted, however, there is tunneling at 3:00 with a maximum distance of 2.3cm. There is a medium amount of serosanguineous drainage noted. The wound margin is thickened. There is small (1-33%) red granulation within the wound bed. There is a large (67-100%) amount of necrotic tissue within the wound bed including Eschar and Adherent Slough. [name], [name] ([id number]) [id number] Page 5 of 6 Assessment Active Problems ICD-10 Non-pressure chronic ulcer of other part of right lower leg with other specified severity Other early  complications of trauma, initial encounter Traumatic secondary and recurrent hemorrhage and seroma, initial encounter Type 2 diabetes mellitus with other skin ulcer Hypothyroidism, unspecified Essential (primary) hypertension Chronic venous hypertension (idiopathic) with ulcer of right lower extremity Procedures Wound #1 Pre-procedure diagnosis of Wound #1 is a Diabetic Wound/Ulcer of the Lower Extremity located on the Right,Medial Lower Leg . There was a Three Layer Compression Therapy Procedure with a pre-treatment ABI of 0.9 by Massie Kluver. Post procedure Diagnosis Wound #1: Same as Pre-Procedure Plan Follow-up Appointments: Wound #1  Right,Medial Lower Leg: Return Appointment in 1 week. Bathing/ Shower/ Hygiene: May shower with wound dressing protected with water repellent cover or cast protector. No tub bath. Edema Control - Lymphedema / Segmental Compressive Device / Other: 3 Layer Compression System for Lymphedema. - right lower leg Elevate legs to the level of the heart and pump ankles as often as possible WOUND #1: - Lower Leg Wound Laterality: Right, Medial Cleanser: Dakin 16 (oz) 0.25 1 x Per Week/30 Days Discharge Instructions: Use as directed. Prim Dressing: Hydrofera Blue Ready Transfer Foam, 4x5 (in/in) 1 x Per Week/30 Days ary Discharge Instructions: Apply Hydrofera Blue Ready to wound bed as directed Prim Dressing: Hydrofera Blue Classic Foam Rope Dressing, 9x6 (mm/in) 1 x Per Week/30 Days ary Discharge Instructions: lightly pack into wounds Secondary Dressing: Zetuvit Plus 4x8 (in/in) 1 x Per Week/30 Days Com pression Wrap: 3-LAYER WRAP - Profore Lite LF 3 Multilayer Compression Bandaging System 1 x Per Week/30 Days Discharge Instructions: Apply 3 multi-layer wrap as prescribed. 1. Wound in the setting of severe chronic venous hypertension probably some degree of the lymphedema. I do not think she needs Santyl here but we continued the [facility]. 2. I have  increased her from kerlix Coban to 3 layer compression to see if we can control the swelling in her lower leg and prevent excessive venous hypertension around the wound 3. Might need to consider an advanced treatment product in this if we are unsuccessful in getting any further closure Electronic Signature(s) Signed: 10/06/2022 4:38:50 PM By: Linton Ham MD Entered By: Linton Ham on 10/06/2022 15:42:39 SuperBill Details -------------------------------------------------------------------------------- [name] ([id number]) [id number] Page 6 of 6 Patient Name: Date of Service: [name], [name] 10/06/2022 Medical Record Number: [id number] Patient Account Number: [id number] Date of Birth/Sex: Treating RN: Jan 18, 1941 (81 y.o. [name] Primary Care Provider: [name] Other Clinician: Massie Kluver Referring Provider: Treating Provider/Extender: Eldridge Dace, [name] in Treatment: 6 Diagnosis Coding ICD-10 Codes Code Description [phone] Non-pressure chronic ulcer of other part of right lower leg with other specified severity T79.8XXA Other early complications of trauma, initial encounter T35.2XXA Traumatic secondary and recurrent hemorrhage and seroma, initial encounter E11.622 Type 2 diabetes mellitus with other skin ulcer E03.9 Hypothyroidism, unspecified I10 Essential (primary) hypertension I87.311 Chronic venous hypertension (idiopathic) with ulcer of right lower extremity Facility Procedures CPT4 Code Description Modifier Quantity [id number] (Facility Use Only) [phone] - APPLY MULTLAY COMPRS LWR RT LEG 1 ICD-10 Diagnosis Description L97.818 Non-pressure chronic ulcer of other part of right lower leg with other specified severity Physician Procedures Quantity CPT4 Code Description Modifier [id number] - WC PHYS LEVEL 3 - EST PT 1 ICD-10 Diagnosis Description L97.818 Non-pressure chronic ulcer of other part  of right lower leg with other specified severity I87.311 Chronic venous hypertension (idiopathic) with ulcer of right lower extremity Electronic Signature(s) Signed: 10/06/2022 4:38:50 PM By: Linton Ham MD Entered By: Linton Ham on 10/06/2022 15:43:00

## 2022-10-07 NOTE — Progress Notes (Signed)
[name] ([id number]) [id number] Page 1 of 10 Visit Report for 10/06/2022 Arrival Information Details Patient Name: Date of Service: [name] 10/06/2022 2:15 PM Medical Record Number: [id number] Patient Account Number: [id number] Date of Birth/Sex: Treating RN: 01/18/1941 (81 y.o. [name] Primary Care Noreta Kue: [name] Other Clinician: Massie Kluver Referring [name]: Treating Rodolfo Gaster/Extender: Eldridge Dace, Mathews Ziglar, Haynes Hoehn in Treatment: 6 Visit Information History Since Last Visit All ordered tests and consults were completed: No Patient Arrived: [name] Added or deleted any medications: No Arrival Time: 14:22 Any new allergies or adverse reactions: No Transfer Assistance: None Had a fall or experienced change in No Patient Requires Transmission-Based Precautions: No activities of daily living that may affect Patient Has Alerts: Yes risk of falls: Patient Alerts: Patient on Blood Thinner Signs or symptoms of abuse/neglect since last visito No 63m aspirin Hospitalized since last visit: No Type II Diabetic Pain Present Now: Yes Electronic Signature(s) Signed: 10/07/2022 7:57:14 AM By: VMassie KluverEntered By: VMassie Kluveron 10/06/2022 14:23:46 -------------------------------------------------------------------------------- Clinic Level of Care Assessment Details Patient Name: Date of Service: [name], MASSE10/25/2023 2:15 PM Medical Record Number: [id number] Account Number: [id number] of Birth/Sex: Treating RN: [birth year](81y.o. FMarlowe ShoresPrimary Care Trissa Molina: ZMia CreekOther Clinician: VMassie KluverReferring Hedi Barkan: Treating Sadeel Fiddler/Extender: REldridge Dace MVanZiglar, SHaynes Hoehnin Treatment: 6 Clinic Level of Care Assessment Items TOOL 1 Quantity Score '[]'  - 0 Use when EandM and Procedure is performed on INITIAL visit ASSESSMENTS - Nursing Assessment /  Reassessment '[]'  - 0 General Physical Exam (combine w/ comprehensive assessment (listed just below) when performed on new pt. evals) '[]'  - 0 Comprehensive Assessment (HX, ROS, Risk Assessments, Wounds Hx, etc.) ASSESSMENTS - Wound and Skin Assessment / Reassessment '[]'  - 0 Dermatologic / Skin Assessment (not related to wound area) [name] ([id number] Page 2 of 10 ASSESSMENTS - Ostomy and/or Continence Assessment and Care '[]'  - 0 Incontinence Assessment and Management '[]'  - 0 Ostomy Care Assessment and Management (repouching, etc.) PROCESS - Coordination of Care '[]'  - 0 Simple Patient / Family Education for ongoing care '[]'  - 0 Complex (extensive) Patient / Family Education for ongoing care '[]'  - 0 Staff obtains CProgrammer, systems Records, T Results / Process Orders est '[]'  - 0 Staff telephones HHA, Nursing Homes / Clarify orders / etc '[]'  - 0 Routine Transfer to another Facility (non-emergent condition) '[]'  - 0 Routine Hospital Admission (non-emergent condition) '[]'  - 0 New Admissions / IBiomedical engineer/ Ordering NPWT Apligraf, etc. , '[]'  - 0 Emergency Hospital Admission (emergent condition) PROCESS - Special Needs '[]'  - 0 Pediatric / Minor Patient Management '[]'  - 0 Isolation Patient Management '[]'  - 0 Hearing / Language / Visual special needs '[]'  - 0 Assessment of Community assistance (transportation, D/C planning, etc.) '[]'  - 0 Additional assistance / Altered mentation '[]'  - 0 Support Surface(s) Assessment (bed, cushion, seat, etc.) INTERVENTIONS - Miscellaneous '[]'  - 0 External ear exam '[]'  - 0 Patient Transfer (multiple staff / HCivil Service fast streamer/ Similar devices) '[]'  - 0 Simple Staple / Suture removal (25 or less) '[]'  - 0 Complex Staple / Suture removal (26 or more) '[]'  - 0 Hypo/Hyperglycemic Management (do not check if billed separately) '[]'  - 0 Ankle / Brachial Index (ABI) - do not check if billed separately Has the patient been seen  at the hospital within the last three years: Yes Total Score: 0 Level Of Care:  ____ Electronic Signature(s) Signed: 10/07/2022 7:57:14 AM By: Massie Kluver Entered By: Massie Kluver on 10/06/2022 14:50:05 -------------------------------------------------------------------------------- Compression Therapy Details Patient Name: Date of Service: [name] 10/06/2022 2:15 PM Medical Record Number: [id number] Patient Account Number: [id number] Date of Birth/Sex: Treating RN: September 06, 1941 (81 y.o. [name] Primary Care Vara Mairena: [name] Other Clinician: Massie Kluver Referring Kelsy Polack: Treating Tige Meas/Extender: Eldridge Dace, [name] in Treatment: 6 Compression Therapy Performed for Wound Assessment: Wound #1 Right,Medial Lower Leg Performed By: Lenice Pressman, [name], Compression Type: [address] [name] [name] ([id number]) [id number] Page 3 of 10 Pre Treatment ABI: 0.9 Post Procedure Diagnosis Same as Pre-procedure Electronic Signature(s) Signed: 10/07/2022 7:57:14 AM By: Massie Kluver Entered By: Massie Kluver on 10/06/2022 14:49:48 -------------------------------------------------------------------------------- Encounter Discharge Information Details Patient Name: Date of Service: [name] 10/06/2022 2:15 PM Medical Record Number: [id number] Patient Account Number: [id number] Date of Birth/Sex: Treating RN: 04/11/1941 (81 y.o. [name] Primary Care Sina Sumpter: [name] Other Clinician: Massie Kluver Referring Jaynia Fendley: Treating Langston [place]/Extender: Eldridge Dace, Adrian Ziglar, Haynes Hoehn in Treatment: 6 Encounter Discharge Information Items Discharge Condition: Stable Ambulatory Status: Walker Discharge Destination: Home Transportation: Private Auto Accompanied By: daughter Schedule Follow-up Appointment: Yes Clinical Summary of Care: Electronic Signature(s) Signed: 10/07/2022  7:57:14 AM By: Massie Kluver Entered By: Massie Kluver on 10/06/2022 15:09:48 -------------------------------------------------------------------------------- Lower Extremity Assessment Details Patient Name: Date of Service: [name], [name] 10/06/2022 2:15 PM Medical Record Number: [id number] Patient Account Number: [id number] Date of Birth/Sex: Treating RN: 02/17/1941 (81 y.o. [name] Primary Care Eldon Zietlow: [name] Other Clinician: Massie Kluver Referring Merrick Feutz: Treating Revanth Neidig/Extender: RO BSO Delane Ginger, MICHA EL Jenetta Downer, Haynes Hoehn in Treatment: 6 Edema Assessment Assessed: [Left: No] [Right: Yes] Edema: [Left: Ye] [Right: s] E[LeftCHERYLYNN, LISZEWSKI [name] ([id number])] [Right: [id number] Page 4 of 10] Calf Left: Right: Point of Measurement: 30 cm From Medial Instep 33 cm Ankle Left: Right: Point of Measurement: 10 cm From Medial Instep 22.6 cm Vascular Assessment Pulses: Dorsalis Pedis Palpable: [Right:Yes] Posterior Tibial Palpable: [Right:Yes] Electronic Signature(s) Signed: 10/06/2022 5:50:20 PM By: Gretta Cool, BSN, RN, CWS, Kim RN, BSN Signed: 10/07/2022 7:57:14 AM By: Massie Kluver Entered By: Massie Kluver on 10/06/2022 14:38:28 -------------------------------------------------------------------------------- Multi Wound Chart Details Patient Name: Date of Service: [name] 10/06/2022 2:15 PM Medical Record Number: [id number] Patient Account Number: [id number] Date of Birth/Sex: Treating RN: [birth year] (81 y.o. [name] Primary Care Alfhild Partch: [name] Other Clinician: Massie Kluver Referring Dion Parrow: Treating Ailynn Gow/Extender: Eldridge Dace, MICHA EL Jenetta Downer, Haynes Hoehn in Treatment: 6 Vital Signs Height(in): 65 Pulse(bpm): 86 Weight(lbs): 172 Blood Pressure(mmHg): 172/72 Body Mass Index(BMI): 28.6 Temperature(F): 98.2 Respiratory Rate(breaths/min): 18 [1:Photos:] [N/A:N/A] Right, Medial Lower Leg  N/A N/A Wound Location: Trauma N/A N/A Wounding Event: Diabetic Wound/Ulcer of the Lower N/A N/A Primary Etiology: Extremity Hypertension, Type II Diabetes, N/A N/A Comorbid History: Osteoarthritis, Neuropathy, Received Chemotherapy, Received Radiation 06/27/2022 N/A N/A Date Acquired: 6 N/A N/A Weeks of Treatment: Open N/A N/A Wound Status: No N/A N/A Wound Recurrence: 3.4x0.7x2.3 N/A N/A Measurements L x W x D (cm) AISIA, CORREIRA [name] ([id number]) [id number] Page 5 of 10 1.869 N/A N/A A (cm) : rea 4.299 N/A N/A Volume (cm) : 71.70% N/A N/A % Reduction in A rea: 59.30% N/A N/A % Reduction in Volume: 3 Position 1 (o'clock): 2.3 Maximum Distance 1 (cm): Yes N/A N/A Tunneling: Grade 2 N/A N/A Classification: Medium N/A N/A Exudate A mount: Serosanguineous  N/A N/A Exudate Type: red, brown N/A N/A Exudate Color: Thickened N/A N/A Wound Margin: Small (1-33%) N/A N/A Granulation A mount: Red N/A N/A Granulation Quality: Large (67-100%) N/A N/A Necrotic A mount: Eschar, Adherent Slough N/A N/A Necrotic Tissue: Fat Layer (Subcutaneous Tissue): Yes N/A N/A Exposed Structures: Fascia: No Tendon: No Muscle: No Joint: No Bone: No None N/A N/A Epithelialization: Treatment Notes Electronic Signature(s) Signed: 10/07/2022 7:57:14 AM By: Massie Kluver Entered By: Massie Kluver on 10/06/2022 14:38:45 -------------------------------------------------------------------------------- Multi-Disciplinary Care Plan Details Patient Name: Date of Service: [name] 10/06/2022 2:15 PM Medical Record Number: [id number] Patient Account Number: [id number] Date of Birth/Sex: Treating RN: 1941/10/16 (81 y.o. [name] Primary Care Mirelle Biskup: [name] Other Clinician: Massie Kluver Referring Draydon Clairmont: Treating Mayfield Schoene/Extender: Eldridge Dace, [place] Ziglar, Haynes Hoehn in Treatment: 6 Active Inactive Necrotic Tissue Nursing  Diagnoses: Impaired tissue integrity related to necrotic/devitalized tissue Knowledge deficit related to management of necrotic/devitalized tissue Goals: Necrotic/devitalized tissue will be minimized in the wound bed Date Initiated: 08/25/2022 Target Resolution Date: 08/25/2022 Goal Status: Active Patient/caregiver will verbalize understanding of reason and process for debridement of necrotic tissue Date Initiated: 08/25/2022 Target Resolution Date: 08/25/2022 Goal Status: Active Interventions: Assess patient pain level pre-, during and post procedure and prior to discharge Provide education on necrotic tissue and debridement process Treatment Activities: Excisional debridement : 08/25/2022 MOANA, MUNFORD [name] ([id number]) [id number] Page 6 of 10 Notes: Orientation to the Wound Care Program Nursing Diagnoses: Knowledge deficit related to the wound healing center program Goals: Patient/caregiver will verbalize understanding of the [place] Date Initiated: 08/25/2022 Target Resolution Date: 08/25/2022 Goal Status: Active Interventions: Provide education on orientation to the wound center Notes: Pain, Acute or Chronic Nursing Diagnoses: Pain Management - Cyclic Acute (Dressing Change Related) Pain, acute or chronic: actual or potential Potential alteration in comfort, pain Goals: Patient will verbalize adequate pain control and receive pain control interventions during procedures as needed Date Initiated: 08/25/2022 Target Resolution Date: 08/25/2022 Goal Status: Active Patient/caregiver will verbalize adequate pain control between visits Date Initiated: 08/25/2022 Target Resolution Date: 08/25/2022 Goal Status: Active Patient/caregiver will verbalize comfort level met Date Initiated: 08/25/2022 Target Resolution Date: 08/25/2022 Goal Status: Active Interventions: Provide education on pain management Reposition patient for  comfort Treatment Activities: Administer pain control measures as ordered : 08/25/2022 Notes: Peripheral Neuropathy Nursing Diagnoses: Knowledge deficit related to disease process and management of peripheral neurovascular dysfunction Potential alteration in peripheral tissue perfusion (select prior to confirmation of diagnosis) Goals: Patient/caregiver will verbalize understanding of disease process and disease management Date Initiated: 08/25/2022 Target Resolution Date: 08/25/2022 Goal Status: Active Interventions: Assess signs and symptoms of neuropathy upon admission and as needed Provide education on Management of Neuropathy and Related Ulcers Notes: Wound/Skin Impairment Nursing Diagnoses: Impaired tissue integrity Knowledge deficit related to smoking impact on wound healing Knowledge deficit related to ulceration/compromised skin integrity Goals: Patient/caregiver will verbalize understanding of skin care regimen Date Initiated: 08/25/2022 Target Resolution Date: 08/25/2022 Goal Status: Active Ulcer/skin breakdown will have a volume reduction of 30% by week 4 Date Initiated: 08/25/2022 Target Resolution Date: 09/22/2022 Goal Status: Active Interventions: Assess ulceration(s) every visit HARTLYN, REIGEL ([id number]) [id number] Page 7 of 10 Provide education on smoking Notes: Electronic Signature(s) Signed: 10/06/2022 5:50:20 PM By: Gretta Cool, BSN, RN, CWS, Kim RN, BSN Signed: 10/07/2022 7:57:14 AM By: Massie Kluver Entered By: Massie Kluver on 10/06/2022 14:38:33 -------------------------------------------------------------------------------- Pain Assessment Details Patient Name: Date of Service: Sandy Salaam, [name] Semen [name]. 10/06/2022 2:15 PM  Medical Record Number: [id number] Patient Account Number: [id number] Date of Birth/Sex: Treating RN: 11-08-1941 (81 y.o. [name] Primary Care Levonte Molina: [name] Other Clinician: Massie Kluver Referring  Farra Nikolic: Treating Taytum Scheck/Extender: Eldridge Dace, [name] in Treatment: 6 Active Problems Location of Pain Severity and Description of Pain Patient Has Paino Yes Site Locations Pain Location: Generalized Pain Duration of the Pain. Constant / Intermittento Constant Rate the pain. Current Pain Level: 3 Character of Pain Describe the Pain: Aching Pain Management and Medication Current Pain Management: Medication: Yes Rest: Yes Electronic Signature(s) Signed: 10/06/2022 5:50:20 PM By: Gretta Cool, BSN, RN, CWS, Kim RN, BSN Signed: 10/07/2022 7:57:14 AM By: Massie Kluver Entered By: Massie Kluver on 10/06/2022 14:28:15 Marlena Clipper [name] ([id number]) [id number] Page 8 of 10 -------------------------------------------------------------------------------- Patient/Caregiver Education Details Patient Name: Date of Service: [name], [name] 10/25/2023andnbsp2:15 PM Medical Record Number: [id number] Patient Account Number: [id number] Date of Birth/Gender: Treating RN: 18-Apr-1941 (81 y.o. [name] Primary Care Physician: [name] Other Clinician: Massie Kluver Referring Physician: Treating Physician/Extender: Eldridge Dace, [place] Ziglar, Haynes Hoehn in Treatment: 6 Education Assessment Education Provided To: Patient and Caregiver daughter Education Topics Provided Venous: Handouts: Controlling Swelling with Multilayered Compression Wraps Methods: Explain/Verbal Responses: State content correctly Wound/Skin Impairment: Handouts: Other: continue wound care as directed Methods: Explain/Verbal Responses: State content correctly Electronic Signature(s) Signed: 10/07/2022 7:57:14 AM By: Massie Kluver Entered By: Massie Kluver on 10/06/2022 14:51:31 -------------------------------------------------------------------------------- Wound Assessment Details Patient Name: Date of Service: [name] 10/06/2022 2:15  PM Medical Record Number: [id number] Patient Account Number: [id number] Date of Birth/Sex: Treating RN: 01-02-1941 (81 y.o. [name] Primary Care Julena Barbour: [name] Other Clinician: Massie Kluver Referring Toree Edling: Treating Platon Arocho/Extender: Eldridge Dace, [place] Ziglar, Haynes Hoehn in Treatment: 6 Wound Status Wound Number: 1 Primary Diabetic Wound/Ulcer of the Lower Extremity Etiology: Wound Location: Right, Medial Lower Leg Wound Open Wounding Event: Trauma Status: Date Acquired: 06/27/2022 Comorbid Hypertension, Type II Diabetes, Osteoarthritis, Neuropathy, Weeks Of Treatment: 6 History: Received Chemotherapy, Received Radiation Clustered Wound: No [name], [name] ([id number]) [id number] Page 9 of 10 Photos Wound Measurements Length: (cm) 3.4 Width: (cm) 0.7 Depth: (cm) 2.3 Area: (cm) 1.869 Volume: (cm) 4.299 % Reduction in Area: 71.7% % Reduction in Volume: 59.3% Epithelialization: None Tunneling: Yes Position (o'clock): 3 Maximum Distance: (cm) 2.3 Undermining: No Wound Description Classification: Grade 2 Wound Margin: Thickened Exudate Amount: Medium Exudate Type: Serosanguineous Exudate Color: red, brown Foul Odor After Cleansing: No Slough/Fibrino Yes Wound Bed Granulation Amount: Small (1-33%) Exposed Structure Granulation Quality: Red Fascia Exposed: No Necrotic Amount: Large (67-100%) Fat Layer (Subcutaneous Tissue) Exposed: Yes Necrotic Quality: Eschar, Adherent Slough Tendon Exposed: No Muscle Exposed: No Joint Exposed: No Bone Exposed: No Treatment Notes Wound #1 (Lower Leg) Wound Laterality: Right, Medial Cleanser Dakin 16 (oz) 0.25 Discharge Instruction: Use as directed. Peri-Wound Care Topical Primary Dressing Hydrofera Blue Ready Transfer Foam, 4x5 (in/in) Discharge Instruction: Apply Hydrofera Blue Ready to wound bed as directed Hydrofera Blue Classic Foam Rope Dressing, 9x6 (mm/in) Discharge  Instruction: lightly pack into wounds Secondary Dressing Zetuvit Plus 4x8 (in/in) Secured With Compression Wrap 3-LAYER WRAP - Profore Lite LF 3 Multilayer Compression Bandaging System Discharge Instruction: Apply 3 multi-layer wrap as prescribed. Compression Stockings Environmental education officer) Signed: 10/06/2022 5:50:20 PM By: Gretta Cool, BSN, RN, CWS, Kim RN, BSN Signed: 10/07/2022 7:57:14 AM By: Massie Kluver Entered By: Massie Kluver on 10/06/2022 14:37:19 Marlena Clipper [name] ([id number]) [id number] Page 10  of 10 -------------------------------------------------------------------------------- Vitals Details Patient Name: Date of Service: [name], [name] 10/06/2022 2:15 PM Medical Record Number: [id number] Patient Account Number: [id number] Date of Birth/Sex: Treating RN: [birth year] (81 y.o. Charolette Forward, Kim Primary Care Janet Decesare: [name] Other Clinician: Massie Kluver Referring Dilyn Smiles: Treating Yossi Hinchman/Extender: Eldridge Dace, MICHA EL Jenetta Downer, Haynes Hoehn in Treatment: 6 Vital Signs Time Taken: 14:24 Temperature (F): 98.2 Height (in): 65 Pulse (bpm): 86 Weight (lbs): 172 Respiratory Rate (breaths/min): 18 Body Mass Index (BMI): 28.6 Blood Pressure (mmHg): 172/72 Reference Range: 80 - 120 mg / dl Electronic Signature(s) Signed: 10/07/2022 7:57:14 AM By: Massie Kluver Entered By: Massie Kluver on 10/06/2022 14:28:08

## 2022-10-13 ENCOUNTER — Encounter: Payer: Medicare PPO | Attending: Internal Medicine | Admitting: Internal Medicine

## 2022-10-13 DIAGNOSIS — Z853 Personal history of malignant neoplasm of breast: Secondary | ICD-10-CM | POA: Insufficient documentation

## 2022-10-13 DIAGNOSIS — I87311 Chronic venous hypertension (idiopathic) with ulcer of right lower extremity: Secondary | ICD-10-CM | POA: Diagnosis present

## 2022-10-13 DIAGNOSIS — Z794 Long term (current) use of insulin: Secondary | ICD-10-CM | POA: Insufficient documentation

## 2022-10-13 DIAGNOSIS — W228XXA Striking against or struck by other objects, initial encounter: Secondary | ICD-10-CM | POA: Insufficient documentation

## 2022-10-13 DIAGNOSIS — T798XXA Other early complications of trauma, initial encounter: Secondary | ICD-10-CM | POA: Diagnosis not present

## 2022-10-13 DIAGNOSIS — E11622 Type 2 diabetes mellitus with other skin ulcer: Secondary | ICD-10-CM | POA: Diagnosis not present

## 2022-10-13 DIAGNOSIS — E039 Hypothyroidism, unspecified: Secondary | ICD-10-CM | POA: Diagnosis not present

## 2022-10-13 DIAGNOSIS — L97818 Non-pressure chronic ulcer of other part of right lower leg with other specified severity: Secondary | ICD-10-CM | POA: Diagnosis not present

## 2022-10-13 DIAGNOSIS — I10 Essential (primary) hypertension: Secondary | ICD-10-CM | POA: Diagnosis not present

## 2022-10-13 NOTE — Progress Notes (Signed)
[name] [name], [name] [name] ([id number]) [id number] Page 1 of 9 Visit Report for 10/13/2022 Arrival Information Details Patient Name: Date of Service: [name] [name], [name] [name] 10/13/2022 2:15 PM Medical Record Number: [id number] Patient Account Number: [id number] Date of Birth/Sex: Treating RN: October 13, 1941 (81 y.o. [name] [name] Primary Care [name] [name]: [name] [name] Other Clinician: Massie Kluver Referring Shavell Nored: Treating [name] [name]/Extender: [name] [name], [name] [name], [name] [name] in Treatment: 7 Visit Information History Since Last Visit All ordered tests and consults were completed: No Patient Arrived: Ambulatory Added or deleted any medications: No Arrival Time: 14:44 Any new allergies or adverse reactions: No Transfer Assistance: None Had a fall or experienced change in No Patient Requires Transmission-Based Precautions: No activities of daily living that may affect Patient Has Alerts: Yes risk of falls: Patient Alerts: Patient on Blood Thinner Hospitalized since last visit: No 65m aspirin Pain Present Now: No Type II Diabetic Electronic Signature(s) Signed: 10/13/2022 4:40:40 PM By: VMassie KluverEntered By: VMassie Kluveron 10/13/2022 14:48:49 -------------------------------------------------------------------------------- Clinic Level of Care Assessment Details Patient Name: Date of Service: [name] [name], [name] [name] 2:15 PM Medical Record Number: [id number] Account Number: [id number] of Birth/Sex: Treating RN: [birth year](81y.o. FMarlowe ShoresPrimary Care Lety Cullens: ZMia CreekOther Clinician: VMassie KluverReferring Jamai Dolce: Treating Kit Mollett/Extender: REldridge [name] MHollymeadZiglar, SHaynes Hoehnin Treatment: 7 Clinic Level of Care Assessment Items TOOL 1 Quantity Score _0  - 0 Use when EandM and Procedure is performed on INITIAL visit ASSESSMENTS - Nursing Assessment / Reassessment _1  - 0 General Physical Exam (combine w/  comprehensive assessment (listed just below) when performed on new pt. evals) _2  - 0 Comprehensive Assessment (HX, ROS, Risk Assessments, Wounds Hx, etc.) ASSESSMENTS - Wound and Skin Assessment / Reassessment _3  - 0 Dermatologic / Skin Assessment (not related to wound area) ASSESSMENTS - Ostomy and/or Continence Assessment and Care [name] [name], [name] [name] ([id number] Page 2 of 9 _4  - 0 Incontinence Assessment and Management _5  - 0 Ostomy Care Assessment and Management (repouching, etc.) PROCESS - Coordination of Care _6  - 0 Simple Patient / Family Education for ongoing care _7  - 0 Complex (extensive) Patient / Family Education for ongoing care _8  - 0 Staff obtains CProgrammer, systems Records, T Results / Process Orders est _9  - 0 Staff telephones HHA, Nursing Homes / Clarify orders / etc _10  - 0 Routine Transfer to another Facility (non-emergent condition) _11  - 0 Routine Hospital Admission (non-emergent condition) _12  - 0 New Admissions / IBiomedical engineer/ Ordering NPWT Apligraf, etc. , _13  - 0 Emergency Hospital Admission (emergent condition) PROCESS - Special Needs _14  - 0 Pediatric / Minor Patient Management _15  - 0 Isolation Patient Management _16  - 0 Hearing / Language / Visual special needs _17  - 0 Assessment of Community assistance (transportation, D/C planning, etc.) _18  - 0 Additional assistance / Altered mentation _19  - 0 Support Surface(s) Assessment (bed, cushion, seat, etc.) INTERVENTIONS - Miscellaneous _20  - 0 External ear exam _21  - 0 Patient Transfer (multiple staff / HCivil Service fast streamer/ Similar devices) _22  - 0 Simple Staple / Suture removal (25 or less) _23  - 0 Complex Staple / Suture removal (26 or more) _24  - 0 Hypo/Hyperglycemic Management (do not check if billed separately) _25  - 0 Ankle / Brachial Index (ABI) - do not check if billed separately Has the patient been seen at the hospital within the last three years: Yes Total  Score: 0 Level Of Care: ____ Electronic Signature(s) Signed: 10/13/2022 4:40:40 PM By: VClifton James  [name] Entered By: Massie Kluver on 10/13/2022 15:21:07 -------------------------------------------------------------------------------- Compression Therapy Details Patient Name: Date of Service: [name] [name], [name] [name] 10/13/2022 2:15 PM Medical Record Number: [id number] Patient Account Number: [id number] Date of Birth/Sex: Treating RN: 01-22-1941 (81 y.o. [name] [name] Primary Care Claryce Friel: [name] [name] Other Clinician: Massie Kluver Referring Florabelle Cardin: Treating Darriel Sinquefield/Extender: [name] [name], [name] in Treatment: 7 Compression Therapy Performed for Wound Assessment: Wound #1 Right,Medial Lower Leg Performed By: Clinician [name] Barman, RN Compression Type: Three Layer Pre Treatment ABI: 0.9 [name] [name], [name] [name] [name] ([id number]) [id number] Page 3 of 9 Post Procedure Diagnosis Same as Pre-procedure Electronic Signature(s) Signed: 10/13/2022 4:40:40 PM By: Massie Kluver Entered By: Massie Kluver on 10/13/2022 15:20:51 -------------------------------------------------------------------------------- Lower Extremity Assessment Details Patient Name: Date of Service: [name] [name], [name] [name] 10/13/2022 2:15 PM Medical Record Number: [id number] Patient Account Number: [id number] Date of Birth/Sex: Treating RN: Dec 07, 1941 (81 y.o. [name] [name] Primary Care Carmelita Amparo: [name] [name] Other Clinician: Massie Kluver Referring Shawntrice Salle: Treating Landrie Beale/Extender: [name] [name], Carrizo [name], [name] [name] in Treatment: 7 Edema Assessment Assessed: [Left: No] [Right: No] Edema: [Left: Ye] [Right: s] Calf Left: Right: Point of Measurement: 30 cm From Medial Instep 33.7 cm Ankle Left: Right: Point of Measurement: 10 cm From Medial Instep 21.8 cm Vascular Assessment Pulses: Dorsalis Pedis Palpable: [Right:Yes] Electronic Signature(s) Signed: 10/13/2022 4:40:40 PM  By: Massie Kluver Signed: 10/13/2022 5:26:54 PM By: Gretta Cool, BSN, RN, CWS, Kim RN, BSN Entered By: Massie Kluver on 10/13/2022 15:05:04 -------------------------------------------------------------------------------- Multi Wound Chart Details Patient Name: Date of Service: [name] Puna [name]. 10/13/2022 2:15 PM Medical Record Number: [id number] Patient Account Number: [id number] [name] [name], [name] [name] ([id number]) [id number] Page 4 of 9 Date of Birth/Sex: Treating RN: 1941/01/05 (81 y.o. 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 Primary Etiology: Extremity Hypertension, Type II Diabetes, N/A N/A Comorbid History: Osteoarthritis, Neuropathy, Received Chemotherapy, Received Radiation 06/27/2022 N/A N/A Date Acquired: 7 N/A N/A Weeks of Treatment: Open N/A N/A Wound Status: No N/A N/A Wound Recurrence: 3.2x0.7x3.1 N/A N/A Measurements L x W x D (cm) 1.759 N/A N/A A (cm) : rea 5.454 N/A N/A Volume (cm) : 73.30% N/A N/A % Reduction in A rea: 48.30% N/A N/A % Reduction in Volume: 12 Position 1 (o'clock): 0.6 Maximum Distance 1 (cm): 5 Position 2 (o'clock): 1 Maximum Distance 2 (cm): 3 Position 3 (o'clock): 3.1 Maximum Distance 3 (cm): Yes N/A N/A Tunneling: Grade 2 N/A N/A Classification: Medium N/A N/A Exudate A mount: Serosanguineous N/A N/A Exudate Type: red, brown N/A N/A Exudate Color: Thickened N/A N/A Wound Margin: Small (1-33%) N/A N/A Granulation A mount: Red N/A  N/A Granulation Quality: Large (67-100%) N/A N/A Necrotic A mount: Eschar, Adherent Slough N/A N/A Necrotic Tissue: Fat Layer (Subcutaneous Tissue): Yes N/A N/A Exposed Structures: Fascia: No Tendon: No Muscle: No Joint: No Bone: No None N/A N/A Epithelialization: Treatment Notes Electronic Signature(s) Signed: 10/13/2022 4:40:40 PM By: Massie Kluver Entered By: Massie Kluver on 10/13/2022 15:05:14 Marlena Clipper [name] ([id number]) [id number] Page 5 of 9 -------------------------------------------------------------------------------- Multi-Disciplinary Care Plan Details Patient Name: Date of Service: [name] [name], [name] [name] 10/13/2022 2:15 PM Medical  Record Number: [id number] Patient Account Number: [id number] Date of Birth/Sex: Treating RN: March 27, 1941 (81 y.o. [name] [name] Primary Care Jannah Guardiola: [name] [name] Other Clinician: Massie Kluver Referring Gabriana Wilmott: Treating Draco Malczewski/Extender: [name] [name], MICHA EL Jenetta Downer, [name] [name] in Treatment: 7 Active Inactive Necrotic Tissue Nursing Diagnoses: Impaired tissue integrity related to necrotic/devitalized tissue Knowledge deficit related to management of necrotic/devitalized tissue Goals: Necrotic/devitalized tissue will be minimized in the wound bed Date Initiated: 08/25/2022 Target Resolution Date: 08/25/2022 Goal Status: Active Patient/caregiver will verbalize understanding of reason and process for debridement of necrotic tissue Date Initiated: 08/25/2022 Target Resolution Date: 08/25/2022 Goal Status: Active Interventions: Assess patient pain level pre-, during and post procedure and prior to discharge Provide education on necrotic tissue and debridement process Treatment Activities: Excisional debridement : 08/25/2022 Notes: Orientation to the Wound Care Program Nursing Diagnoses: Knowledge deficit related to the wound healing center program Goals: Patient/caregiver will verbalize understanding  of the [place] Program Date Initiated: 08/25/2022 Target Resolution Date: 08/25/2022 Goal Status: Active Interventions: Provide education on orientation to the wound center Notes: Pain, Acute or Chronic Nursing Diagnoses: Pain Management - Cyclic Acute (Dressing Change Related) Pain, acute or chronic: actual or potential Potential alteration in comfort, pain Goals: Patient will verbalize adequate pain control and receive pain control interventions during procedures as needed Date Initiated: 08/25/2022 Target Resolution Date: 08/25/2022 Goal Status: Active Patient/caregiver will verbalize adequate pain control between visits Date Initiated: 08/25/2022 Target Resolution Date: 08/25/2022 Goal Status: Active [name] [name], [name] [name] ([id number]) [id number] Page 6 of 9 Patient/caregiver will verbalize comfort level met Date Initiated: 08/25/2022 Target Resolution Date: 08/25/2022 Goal Status: Active Interventions: Provide education on pain management Reposition patient for comfort Treatment Activities: Administer pain control measures as ordered : 08/25/2022 Notes: Peripheral Neuropathy Nursing Diagnoses: Knowledge deficit related to disease process and management of peripheral neurovascular dysfunction Potential alteration in peripheral tissue perfusion (select prior to confirmation of diagnosis) Goals: Patient/caregiver will verbalize understanding of disease process and disease management Date Initiated: 08/25/2022 Target Resolution Date: 08/25/2022 Goal Status: Active Interventions: Assess signs and symptoms of neuropathy upon admission and as needed Provide education on Management of Neuropathy and Related Ulcers Notes: Wound/Skin Impairment Nursing Diagnoses: Impaired tissue integrity Knowledge deficit related to smoking impact on wound healing Knowledge deficit related to ulceration/compromised skin integrity Goals: Patient/caregiver will  verbalize understanding of skin care regimen Date Initiated: 08/25/2022 Target Resolution Date: 08/25/2022 Goal Status: Active Ulcer/skin breakdown will have a volume reduction of 30% by week 4 Date Initiated: 08/25/2022 Target Resolution Date: 09/22/2022 Goal Status: Active Interventions: Assess ulceration(s) every visit Provide education on smoking Notes: Electronic Signature(s) Signed: 10/13/2022 4:40:40 PM By: Massie Kluver Signed: 10/13/2022 5:26:54 PM By: Gretta Cool, BSN, RN, CWS, Kim RN, BSN Entered By: Massie Kluver on 10/13/2022 15:05:08 -------------------------------------------------------------------------------- Pain Assessment Details Patient Name: Date of Service: [name] [name] 10/13/2022 2:15 PM Medical Record Number: [id number] Patient Account Number: [id number] Date of Birth/Sex: Treating RN: [birth year] (81 y.o. [name] [name] Primary Care Nahla Lukin: [name] [name] Other Clinician: Massie Kluver Referring Lyric Hoar: Treating Jaydalynn Olivero/Extender: [address] [place], [place], [place] ([id number]) [id number] Page 7 of 9 Weeks in Treatment: 7 Active Problems Location of Pain Severity and Description of Pain Patient Has Paino No Site Locations Pain Management and Medication Current Pain Management: Electronic Signature(s) Signed: 10/13/2022 4:40:40 PM By: Massie Kluver Signed: 10/13/2022 5:26:54 PM By: Gretta Cool, BSN, RN, CWS, Kim RN, BSN Entered By: Massie Kluver on 10/13/2022 15:01:42 -------------------------------------------------------------------------------- Patient/Caregiver Education Details Patient  Name: Date of Service: [name] [name], [name] [name] 11/1/2023andnbsp2:15 PM Medical Record Number: [id number] Patient Account Number: [id number] Date of Birth/Gender: Treating RN: 1941-05-14 (81 y.o. [name] [name] Primary Care Physician: [name] [name] Other Clinician: Massie Kluver Referring Physician: Treating Physician/Extender:  [name] [name], [place] [name], [name] [name] in Treatment: 7 Education Assessment Education Provided To: Patient Education Topics Provided Wound/Skin Impairment: Handouts: Other: continue wound care as directed Methods: Explain/Verbal Responses: State content correctly Electronic Signature(s) Signed: 10/13/2022 4:40:40 PM By: Massie Kluver Entered By: Massie Kluver on 10/13/2022 15:21:42 Marlena Clipper [name] ([id number]) [id number] Page 8 of 9 -------------------------------------------------------------------------------- Wound Assessment Details Patient Name: Date of Service: [name] [name], [name] [name] 10/13/2022 2:15 PM Medical Record Number: [id number] Patient Account Number: [id number] Date of Birth/Sex: Treating RN: 06-16-1941 (81 y.o. Charolette Forward, Kim Primary Care Brandun Pinn: [name] [name] Other Clinician: Massie Kluver Referring Jaedan Schuman: Treating Caralynn Gelber/Extender: [name] [name], MICHA EL Jenetta Downer, [name] [name] in Treatment: 7 Wound Status Wound Number: 1 Primary Diabetic Wound/Ulcer of the Lower Extremity Etiology: Wound Location: Right, Medial Lower Leg Wound Open Wounding Event: Trauma Status: Date Acquired: 06/27/2022 Comorbid Hypertension, Type II Diabetes, Osteoarthritis, Neuropathy, Weeks Of Treatment: 7 History: Received Chemotherapy, Received Radiation Clustered Wound: No Photos Wound Measurements Length: (cm) 3.2 Width: (cm) 0.7 Depth: (cm) 3.1 Area: (cm) 1.759 Volume: (cm) 5.454 % Reduction in Area: 73.3% % Reduction in Volume: 48.3% Epithelialization: None Tunneling: Yes Location 1 Position (o'clock): 12 Maximum Distance: (cm) 0.6 Location 2 Position (o'clock): 5 Maximum Distance: (cm) 1 Location 3 Position (o'clock): 3 Maximum Distance: (cm) 3.1 Wound Description Classification: Grade 2 Wound Margin: Thickened Exudate Amount: Medium Exudate Type: Serosanguineous Exudate Color: red, brown Foul Odor After Cleansing:  No Slough/Fibrino Yes Wound Bed Granulation Amount: Small (1-33%) Exposed Structure Granulation Quality: Red Fascia Exposed: No Necrotic Amount: Large (67-100%) Fat Layer (Subcutaneous Tissue) Exposed: Yes Necrotic Quality: Eschar, Adherent Slough Tendon Exposed: No Muscle Exposed: No Joint Exposed: No [name] [name], [name] [name] ([id number]) [id number] Page 9 of 9 Bone Exposed: No Electronic Signature(s) Signed: 10/13/2022 4:40:40 PM By: Massie Kluver Signed: 10/13/2022 5:26:54 PM By: Gretta Cool, BSN, RN, CWS, Kim RN, BSN Entered By: Massie Kluver on 10/13/2022 15:04:01 -------------------------------------------------------------------------------- [place] Details Patient Name: Date of Service: [name] Puna [name]. 10/13/2022 2:15 PM Medical Record Number: [id number] Patient Account Number: [id number] Date of Birth/Sex: Treating RN: February 23, 1941 (81 y.o. Charolette Forward, Kim Primary Care Sallie Maker: [name] [name] Other Clinician: Massie Kluver Referring Jacky Hartung: Treating Ulyssa Walthour/Extender: [name] [name], MICHA EL Jenetta Downer, [name] [name] in Treatment: 7 Vital Signs Time Taken: 14:49 Temperature (F): 98.3 Height (in): 65 Pulse (bpm): 93 Weight (lbs): 172 Respiratory Rate (breaths/min): 18 Body Mass Index (BMI): 28.6 Blood Pressure (mmHg): 178/89 Reference Range: 80 - 120 mg / dl Electronic Signature(s) Signed: 10/13/2022 4:40:40 PM By: Massie Kluver Entered By: Massie Kluver on 10/13/2022 14:52:11

## 2022-10-13 NOTE — Progress Notes (Signed)
DEMIYAH, FISCHBACH ([id number]) [id number] Page 1 of 6 Visit Report for 10/13/2022 HPI Details Patient Name: Date of Service: [name], [name] 10/13/2022 2:15 PM Medical Record Number: [id number] Patient Account Number: [id number] Date of Birth/Sex: Treating RN: 04-14-1941 (81 y.o. [name] Primary Care Provider: Mia [name] Other Clinician: Massie [name] Referring Provider: Treating Provider/Extender: [name], [name] [name] in Treatment: 7 History of Present Illness HPI Description: Admission 08/25/2022 [name] is an 81 year old female with a past medical history of breast cancer, with insulin dependent, controlled type 2 diabetes, chronic venous insufficiency that presents to the clinic for a 93-monthhistory of nonhealing ulcer to the right lower extremity. She states she hit her leg against a stair and developed a hematoma. The area was evacuated by her primary care physician. She has tried Neosporin and wet-to-dry dressings. She reports mild chronic pain to the area. She denies increased warmth, erythema or purulent drainage. 9/20; patient presents for follow-up. She had an x-ray of the right leg done at last clinic visit that did not show Evidence of osteomyelitis. She has been doing Dakin's wet-to-dry dressings without issues. She denies signs of infection. 9/27; patient presents for follow-up. Hydrofera Blue with antibiotic ointment was used under Kerlix/Coban at last clinic visit. She tolerated this well. She has no issues or complaints today. She denies signs of infection. 10/4; patient presents for follow-up. We have been using Hydrofera Blue to the wound bed all under Kerlix/Coban. She has no issues or complaints today. She has tolerated this well. 10/11; patient presents for follow-up. We have been using Hydrofera Blue to the wound bed under Kerlix/Coban. She has tolerated this well and has no issues or  complaints. 10/18; patient presents for follow-up. We have been using Hydrofera Blue with Santyl to the wound bed under Kerlix/Coban. She has no issues or complaints today. 10/25; this is a patient with a right lower extremity medial leg ulcer in the setting of fairly severe chronic venous insufficiency. She has been using Santyl Hydrofera Blue rope Hydrofera Blue under kerlix Coban. She presents with 3 tunneling areas in the wound. 11/1; this is a patient with a difficult right lower extremity medial leg ulcer in the setting of fairly clear chronic venous hypertension, hemosiderin deposition. This was initially traumatic in the setting of a fairly clear hematoma looking at cell phone pictures today. We have been using Hydrofera Blue under compression. Electronic Signature(s) Signed: 10/13/2022 3:59:15 PM By: [name] HamMD Entered By: [name] Hamon 10/13/2022 15:32:10 -------------------------------------------------------------------------------- Physical Exam Details Patient Name: Date of Service: [name] How11/12/2021 2:15 PM Medical Record Number: [id number] Account Number: [id number] of Birth/Sex: Treating RN: [birth year](81y.o. FMarlowe ShoresPrimary Care Provider: ZMia CreekOther Clinician: VArnell, [name] ([id number] Page 2 of 6 Referring Provider: Treating Provider/Extender: RO BSO N, [name] GMadilyn Firemanin Treatment: 7 Constitutional Patient is hypertensive.. Pulse regular and within target range for patient..Marland KitchenRespirations regular, non-labored and within target range.. Temperature is normal and within the target range for the patient..Marland Kitchenappears in no distress. Cardiovascular Pedal pulses are palpable. Edema is under fairly good control. Notes Wound exam; the wound itself is fairly clean. She has 3 small tunnels 1 superiorly and 1 inferiorly both of these may have come in a bit  the real problem is an area inferiorly that probes posteriorly widely. There is no drainage, no evidence of infection. Her edema  control is fairly good. Peripheral pulses are palpable Electronic Signature(s) Signed: 10/13/2022 3:59:15 PM By: [name] Ham MD Entered By: [name] on 10/13/2022 15:37:57 -------------------------------------------------------------------------------- Physician Orders Details Patient Name: Date of Service: [name] 10/13/2022 2:15 PM Medical Record Number: [id number] Patient Account Number: [id number] Date of Birth/Sex: Treating RN: [birth year] (80 y.o. [name] Primary Care Provider: Mia [name] Other Clinician: Massie [name] Referring Provider: Treating Provider/Extender: [name], [name], [name] in Treatment: 7 Verbal / Phone Orders: No Diagnosis Coding Follow-up Appointments Wound #1 Right,Medial Lower Leg Return Appointment in 1 week. Bathing/ Shower/ Hygiene May shower with wound dressing protected with water repellent cover or cast protector. No tub bath. Edema Control - Lymphedema / Segmental Compressive Device / Other 3 Layer Compression System for Lymphedema. - right lower leg Elevate legs to the level of the heart and pump ankles as often as possible Negative Pressure Wound Therapy Wound #1 Right,Medial Lower Leg Other: - verify vac with insurance Wound Treatment Wound #1 - Lower Leg Wound Laterality: Right, Medial Cleanser: Dakin 16 (oz) 0.25 1 x Per Week/30 Days Discharge Instructions: Use as directed. Prim Dressing: Hydrofera Blue Ready Transfer Foam, 4x5 (in/in) 1 x Per Week/30 Days ary Discharge Instructions: Apply Hydrofera Blue Ready to wound bed as directed Prim Dressing: Hydrofera Blue Classic Foam Rope Dressing, 9x6 (mm/in) ary 1 x Per Week/30 Days Discharge Instructions: lightly pack into wounds Secondary Dressing: Zetuvit Plus 4x8 (in/in) 1 x Per Week/30 Days Compression Wrap: 3-LAYER  WRAP - Profore Lite LF 3 Multilayer Compression Bandaging System 1 x Per Week/30 Days [name], [name] ([id number]) [id number] Page 3 of 6 Discharge Instructions: Apply 3 multi-layer wrap as prescribed. Electronic Signature(s) Signed: 10/13/2022 3:59:15 PM By: [name] Ham MD Signed: 10/13/2022 4:40:40 PM By: [name] Entered By: [name] on 10/13/2022 15:21:01 -------------------------------------------------------------------------------- Problem List Details Patient Name: Date of Service: [name] 10/13/2022 2:15 PM Medical Record Number: [id number] Patient Account Number: [id number] Date of Birth/Sex: Treating RN: 1941-06-12 (81 y.o. [name] Primary Care Provider: Mia [name] Other Clinician: Massie [name] Referring Provider: Treating Provider/Extender: [name], [name] [name] in Treatment: 7 Active Problems ICD-10 Encounter Code Description Active Date MDM Diagnosis L97.818 Non-pressure chronic ulcer of other part of right lower leg with other specified 08/25/2022 No Yes severity T79.8XXA Other early complications of trauma, initial encounter 08/25/2022 No Yes T79.2XXA Traumatic secondary and recurrent hemorrhage and seroma, initial encounter 08/25/2022 No Yes E11.622 Type 2 diabetes mellitus with other skin ulcer 08/25/2022 No Yes E03.9 Hypothyroidism, unspecified 08/25/2022 No Yes I10 Essential (primary) hypertension 08/25/2022 No Yes I87.311 Chronic venous hypertension (idiopathic) with ulcer of right lower extremity 08/25/2022 No Yes Inactive Problems Resolved Problems Electronic Signature(s) Signed: 10/13/2022 3:59:15 PM By: [name] Ham MD Entered By: [name] on 10/13/2022 15:23:54 [name] ([id number]) [id number] Page 4 of 6 -------------------------------------------------------------------------------- Progress Note Details Patient Name: Date of  Service: [name], [name] 10/13/2022 2:15 PM Medical Record Number: [id number] Patient Account Number: [id number] Date of Birth/Sex: Treating RN: 11-25-1941 (81 y.o. [name] Primary Care Provider: Mia [name] Other Clinician: Massie [name] Referring Provider: Treating Provider/Extender: RO BSO N, [place] [name], [name] in Treatment: 7 Subjective History of Present Illness (HPI) Admission 08/25/2022 Ms. Cristle Jared is an 81 year old female with a past medical history of breast cancer, with insulin dependent, controlled type 2 diabetes, chronic venous insufficiency that presents to the clinic for a  74-monthhistory of nonhealing ulcer to the right lower extremity. She states she hit her leg against a stair and developed a hematoma. The area was evacuated by her primary care physician. She has tried Neosporin and wet-to-dry dressings. She reports mild chronic pain to the area. She denies increased warmth, erythema or purulent drainage. 9/20; patient presents for follow-up. She had an x-ray of the right leg done at last clinic visit that did not show Evidence of osteomyelitis. She has been doing Dakin's wet-to-dry dressings without issues. She denies signs of infection. 9/27; patient presents for follow-up. Hydrofera Blue with antibiotic ointment was used under Kerlix/Coban at last clinic visit. She tolerated this well. She has no issues or complaints today. She denies signs of infection. 10/4; patient presents for follow-up. We have been using Hydrofera Blue to the wound bed all under Kerlix/Coban. She has no issues or complaints today. She has tolerated this well. 10/11; patient presents for follow-up. We have been using Hydrofera Blue to the wound bed under Kerlix/Coban. She has tolerated this well and has no issues or complaints. 10/18; patient presents for follow-up. We have been using Hydrofera Blue with Santyl to the wound bed under Kerlix/Coban. She has no issues or  complaints today. 10/25; this is a patient with a right lower extremity medial leg ulcer in the setting of fairly severe chronic venous insufficiency. She has been using Santyl Hydrofera Blue rope Hydrofera Blue under kerlix Coban. She presents with 3 tunneling areas in the wound. 11/1; this is a patient with a difficult right lower extremity medial leg ulcer in the setting of fairly clear chronic venous hypertension, hemosiderin deposition. This was initially traumatic in the setting of a fairly clear hematoma looking at cell phone pictures today. We have been using Hydrofera Blue under compression. Objective Constitutional Patient is hypertensive.. Pulse regular and within target range for patient..Marland KitchenRespirations regular, non-labored and within target range.. Temperature is normal and within the target range for the patient..Marland Kitchenappears in no distress. Vitals Time Taken: 2:49 PM, Height: 65 in, Weight: 172 lbs, BMI: 28.6, Temperature: 98.3 F, Pulse: 93 bpm, Respiratory Rate: 18 breaths/min, Blood Pressure: 178/89 mmHg. Cardiovascular Pedal pulses are palpable. Edema is under fairly good control. General Notes: Wound exam; the wound itself is fairly clean. She has 3 small tunnels 1 superiorly and 1 inferiorly both of these may have come in a bit the real problem is an area inferiorly that probes posteriorly widely. There is no drainage, no evidence of infection. Her edema control is fairly good. Peripheral pulses are palpable Integumentary (Hair, Skin) Wound #1 status is Open. Original cause of wound was Trauma. The date acquired was: 06/27/2022. The wound has been in treatment 7 weeks. The wound is located on the Right,Medial Lower Leg. The wound measures 3.2cm length x 0.7cm width x 3.1cm depth; 1.759cm^2 area and 5.454cm^3 volume. There is Fat Layer (Subcutaneous Tissue) exposed. There is tunneling at 12:00 with a maximum distance of 0.6cm. There is additional tunneling at 5:00 with a  maximum distance of 1cm, and at 3:00 with a maximum distance of 3.1cm. There is a medium amount of serosanguineous drainage noted. The wound margin is [name], [name]([id number] Page 5 of 6 thickened. There is small (1-33%) red granulation within the wound bed. There is a large (67-100%) amount of necrotic tissue within the wound bed including Eschar and Adherent Slough. Assessment Active Problems ICD-10 Non-pressure chronic ulcer of other part of right lower leg with other specified severity  Other early complications of trauma, initial encounter Traumatic secondary and recurrent hemorrhage and seroma, initial encounter Type 2 diabetes mellitus with other skin ulcer Hypothyroidism, unspecified Essential (primary) hypertension Chronic venous hypertension (idiopathic) with ulcer of right lower extremity Procedures Wound #1 Pre-procedure diagnosis of Wound #1 is a Diabetic Wound/Ulcer of the Lower Extremity located on the Right,Medial Lower Leg . There was a Three Layer Compression Therapy Procedure with a pre-treatment ABI of 0.9 by Cornell Barman, RN. Post procedure Diagnosis Wound #1: Same as Pre-Procedure Plan Follow-up Appointments: Wound #1 Right,Medial Lower Leg: Return Appointment in 1 week. Bathing/ Shower/ Hygiene: May shower with wound dressing protected with water repellent cover or cast protector. No tub bath. Edema Control - Lymphedema / Segmental Compressive Device / Other: 3 Layer Compression System for Lymphedema. - right lower leg Elevate legs to the level of the heart and pump ankles as often as possible Negative Pressure Wound Therapy: Wound #1 Right,Medial Lower Leg: Other: - verify vac with insurance WOUND #1: - Lower Leg Wound Laterality: Right, Medial Cleanser: Dakin 16 (oz) 0.25 1 x Per Week/30 Days Discharge Instructions: Use as directed. Prim Dressing: Hydrofera Blue Ready Transfer Foam, 4x5 (in/in) 1 x Per Week/30  Days ary Discharge Instructions: Apply Hydrofera Blue Ready to wound bed as directed Prim Dressing: Hydrofera Blue Classic Foam Rope Dressing, 9x6 (mm/in) 1 x Per Week/30 Days ary Discharge Instructions: lightly pack into wounds Secondary Dressing: Zetuvit Plus 4x8 (in/in) 1 x Per Week/30 Days Com pression Wrap: 3-LAYER WRAP - Profore Lite LF 3 Multilayer Compression Bandaging System 1 x Per Week/30 Days Discharge Instructions: Apply 3 multi-layer wrap as prescribed. 1. We continued with the Hydrofera Blue blue packing rope under 3 layer compression which she appears to be tolerating although she complains about it 2. I had some thoughts about a snap VAC vis--vis the posteriorly orientated tunnel. We will put this through her insurance but I assured her I am not ready to order this yet. Just exploring a possibility Electronic Signature(s) Signed: 10/13/2022 3:59:15 PM By: [name] Ham MD Entered By: [name] on 10/13/2022 15:38:53 [name] ([id number]) [id number] Page 6 of 6 -------------------------------------------------------------------------------- SuperBill Details Patient Name: Date of Service: [name], [name] 10/13/2022 Medical Record Number: [id number] Patient Account Number: [id number] Date of Birth/Sex: Treating RN: 01/18/1941 (81 y.o. [name] Primary Care Provider: Mia [name] Other Clinician: Massie [name] Referring Provider: Treating Provider/Extender: [name], [name] [name] in Treatment: 7 Diagnosis Coding ICD-10 Codes Code Description [phone] Non-pressure chronic ulcer of other part of right lower leg with other specified severity T79.8XXA Other early complications of trauma, initial encounter T46.2XXA Traumatic secondary and recurrent hemorrhage and seroma, initial encounter E11.622 Type 2 diabetes mellitus with other skin ulcer E03.9 Hypothyroidism, unspecified I10 Essential (primary)  hypertension I87.311 Chronic venous hypertension (idiopathic) with ulcer of right lower extremity Facility Procedures : CPT4 Code: [id number] Description: (Facility Use Only) [id number] - APPLY MULTLAY COMPRS LWR RT LEG ICD-10 Diagnosis Description L97.818 Non-pressure chronic ulcer of other part of right lower leg with other specified sever Modifier: ity Quantity: 1 Physician Procedures : CPT4 Code Description Modifier [id number] - WC PHYS LEVEL 3 - EST PT ICD-10 Diagnosis Description L97.818 Non-pressure chronic ulcer of other part of right lower leg with other specified severity I87.311 Chronic venous hypertension (idiopathic) with  ulcer of right lower extremity T79.8XXA Other early complications of trauma, initial encounter Quantity: 1 Electronic Signature(s) Signed: 10/13/2022 3:59:15 PM By: [name] Ham MD  Entered By: [name] on 10/13/2022 15:39:15

## 2022-10-20 ENCOUNTER — Encounter (HOSPITAL_BASED_OUTPATIENT_CLINIC_OR_DEPARTMENT_OTHER): Payer: Medicare PPO | Admitting: Internal Medicine

## 2022-10-20 DIAGNOSIS — T798XXA Other early complications of trauma, initial encounter: Secondary | ICD-10-CM | POA: Diagnosis not present

## 2022-10-20 DIAGNOSIS — L97818 Non-pressure chronic ulcer of other part of right lower leg with other specified severity: Secondary | ICD-10-CM

## 2022-10-20 DIAGNOSIS — E11622 Type 2 diabetes mellitus with other skin ulcer: Secondary | ICD-10-CM

## 2022-10-20 DIAGNOSIS — I87311 Chronic venous hypertension (idiopathic) with ulcer of right lower extremity: Secondary | ICD-10-CM | POA: Diagnosis not present

## 2022-10-21 NOTE — Progress Notes (Signed)
BRECKLYNN, JIAN ([id number]) [id number] Page 1 of 8 Visit Report for 10/20/2022 Chief Complaint Document Details Patient Name: Date of Service: [name] [name], [name] [name] 10/20/2022 2:15 PM Medical Record Number: [id number] Patient Account Number: [id number] Date of Birth/Sex: Treating RN: 02/04/1941 (81 y.o. [name] [name] Primary Care Provider: [name] Other Clinician: Massie Kluver Referring Provider: Treating Provider/Extender: Laural Roes in Treatment: 8 Information Obtained from: Patient Chief Complaint 08/25/2022; right lower extremity wounds secondary to traumatic hematoma Electronic Signature(s) Signed: 10/20/2022 3:55:12 PM By: Kalman Shan DO Entered By: Kalman Shan on 10/20/2022 15:51:27 -------------------------------------------------------------------------------- Debridement Details Patient Name: Date of Service: [name] [name] 10/20/2022 2:15 PM Medical Record Number: [id number] Patient Account Number: [id number] Date of Birth/Sex: Treating RN: [birth year] (81 y.o. [name] [name] Primary Care Provider: [name] Other Clinician: Massie Kluver Referring Provider: Treating Provider/Extender: Laural Roes in Treatment: 8 Debridement Performed for Assessment: Wound #1 Right,Medial Lower Leg Performed By: Physician Kalman Shan, MD Debridement Type: Debridement Severity of Tissue Pre Debridement: Fat layer exposed Level of Consciousness (Pre-procedure): Awake and Alert Pre-procedure Verification/Time Out Yes - 15:09 Taken: Start Time: 15:09 T Area Debrided (L x W): otal 1 (cm) x 0.5 (cm) = 0.5 (cm) Tissue and other material debrided: Viable, Non-Viable, Slough, Subcutaneous, Slough Level: Skin/Subcutaneous Tissue Debridement Description: Excisional Instrument: Curette Bleeding: Minimum Hemostasis Achieved: Pressure End Time: 15:10 Response to Treatment: Procedure was  tolerated well Level of Consciousness (Post- Awake and Alert procedure): [name] [name], [name] [name] ([id number]) [id number] Page 2 of 8 Post Debridement Measurements of Total Wound Length: (cm) 3 Width: (cm) 0.5 Depth: (cm) 2.1 Volume: (cm) 2.474 Character of Wound/Ulcer Post Debridement: Stable Severity of Tissue Post Debridement: Fat layer exposed Post Procedure Diagnosis Same as Pre-procedure Electronic Signature(s) Signed: 10/20/2022 3:55:12 PM By: Kalman Shan DO Signed: 10/20/2022 4:34:32 PM By: Gretta Cool, BSN, RN, CWS, Kim RN, BSN Signed: 10/21/2022 12:52:59 PM By: Massie Kluver Entered By: Massie Kluver on 10/20/2022 15:11:14 -------------------------------------------------------------------------------- HPI Details Patient Name: Date of Service: [name] [name]. 10/20/2022 2:15 PM Medical Record Number: [id number] Patient Account Number: [id number] Date of Birth/Sex: Treating RN: [birth year] (81 y.o. [name] [name] Primary Care Provider: [name] Other Clinician: Massie Kluver Referring Provider: Treating Provider/Extender: Laural Roes in Treatment: 8 History of Present Illness HPI Description: Admission 08/25/2022 Ms. [name] [name] is an 81 year old female with a past medical history of breast cancer, with insulin dependent, controlled type 2 diabetes, chronic venous insufficiency that presents to the clinic for a 62-monthhistory of nonhealing ulcer to the right lower extremity. She states she hit her leg against a stair and developed a hematoma. The area was evacuated by her primary care physician. She has tried Neosporin and wet-to-dry dressings. She reports mild chronic pain to the area. She denies increased warmth, erythema or purulent drainage. 9/20; patient presents for follow-up. She had an x-ray of the right leg done at last clinic visit that did not show Evidence of osteomyelitis. She has been doing Dakin's  wet-to-dry dressings without issues. She denies signs of infection. 9/27; patient presents for follow-up. Hydrofera Blue with antibiotic ointment was used under Kerlix/Coban at last clinic visit. She tolerated this well. She has no issues or complaints today. She denies signs of infection. 10/4; patient presents for follow-up. We have been using Hydrofera Blue to the wound bed all under Kerlix/Coban. She has no issues or complaints today. She has tolerated this well. 10/11; patient presents for follow-up. We have  been using Hydrofera Blue to the wound bed under Kerlix/Coban. She has tolerated this well and has no issues or complaints. 10/18; patient presents for follow-up. We have been using Hydrofera Blue with Santyl to the wound bed under Kerlix/Coban. She has no issues or complaints today. 10/25; this is a patient with a right lower extremity medial leg ulcer in the setting of fairly severe chronic venous insufficiency. She has been using Santyl Hydrofera Blue rope Hydrofera Blue under kerlix Coban. She presents with 3 tunneling areas in the wound. 11/1; this is a patient with a difficult right lower extremity medial leg ulcer in the setting of fairly clear chronic venous hypertension, hemosiderin deposition. This was initially traumatic in the setting of a fairly clear hematoma looking at cell phone pictures today. We have been using Hydrofera Blue under compression. 11/8; patient presents for follow-up. We have been using Hydrofera Blue blue under compression therapy. She has no issues or complaints today. Snap VAC was recommended at last clinic visit and this was run through insurance. We will ask for clarification on cost. Electronic Signature(s) Signed: 10/20/2022 3:55:12 PM By: Kalman Shan DO Entered By: Kalman Shan on 10/20/2022 15:52:04 [name] [name] [name] ([id number]) [id number] Page 3 of  8 -------------------------------------------------------------------------------- Physical Exam Details Patient Name: Date of Service: [name] [name], [name] [name] 10/20/2022 2:15 PM Medical Record Number: [id number] Patient Account Number: [id number] Date of Birth/Sex: Treating RN: 26-Apr-1941 (81 y.o. [name] [name] Primary Care Provider: [name] Other Clinician: Massie Kluver Referring Provider: Treating Provider/Extender: Laural Roes in Treatment: 8 Constitutional . Cardiovascular . Psychiatric . Notes Right lower extremity: T the distal medial aspect there is an open wound with granulation tissue and nonviable tissue With 2 tunnels of increased depth. No o surrounding soft tissue infection including increased warmth, erythema or purulent drainage. Venous stasis dermatitis. Good edema control. Electronic Signature(s) Signed: 10/20/2022 3:55:12 PM By: Kalman Shan DO Entered By: Kalman Shan on 10/20/2022 15:53:01 -------------------------------------------------------------------------------- Physician Orders Details Patient Name: Date of Service: [name] [name] 10/20/2022 2:15 PM Medical Record Number: [id number] Patient Account Number: [id number] Date of Birth/Sex: Treating RN: October 05, 1941 (81 y.o. [name] [name] Primary Care Provider: [name] Other Clinician: Massie Kluver Referring Provider: Treating Provider/Extender: Laural Roes in Treatment: 8 Verbal / Phone Orders: No Diagnosis Coding Follow-up Appointments Wound #1 Right,Medial Lower Leg Return Appointment in 1 week. Bathing/ Shower/ Hygiene May shower with wound dressing protected with water repellent cover or cast protector. No tub bath. Edema Control - Lymphedema / Segmental Compressive Device / PRETTY, WELTMAN ([id number]) [id number] Page 4 of 8 3 Layer Compression System for Lymphedema. - right lower  leg Elevate legs to the level of the heart and pump ankles as often as possible Negative Pressure Wound Therapy Wound #1 Right,Medial Lower Leg Other: - verify vac with insurance Wound Treatment Wound #1 - Lower Leg Wound Laterality: Right, Medial Cleanser: Dakin 16 (oz) 0.25 1 x Per Week/30 Days Discharge Instructions: Use as directed. Topical: Gentamicin 1 x Per Week/30 Days Discharge Instructions: Apply very small amount to hydrofera rope and place into tunnels Topical: Mupirocin Ointment 1 x Per Week/30 Days Discharge Instructions: Apply very small amount to hydrofera rope and place into tunnels Prim Dressing: Hydrofera Blue Ready Transfer Foam, 4x5 (in/in) 1 x Per Week/30 Days ary Discharge Instructions: Apply Hydrofera Blue Ready to wound bed as directed Prim Dressing: Hydrofera Blue Classic Foam Rope Dressing, 9x6 (mm/in) ary 1 x Per Week/30 Days Discharge  Instructions: lightly pack into wounds Secondary Dressing: Zetuvit Plus 4x8 (in/in) 1 x Per Week/30 Days Compression Wrap: 3-LAYER WRAP - Profore Lite LF 3 Multilayer Compression Bandaging System 1 x Per Week/30 Days Discharge Instructions: Apply 3 multi-layer wrap as prescribed. Electronic Signature(s) Signed: 10/20/2022 3:55:12 PM By: Kalman Shan DO Entered By: Kalman Shan on 10/20/2022 15:54:46 -------------------------------------------------------------------------------- Problem List Details Patient Name: Date of Service: [name] [name] 10/20/2022 2:15 PM Medical Record Number: [id number] Patient Account Number: [id number] Date of Birth/Sex: Treating RN: [birth year] (81 y.o. [name] [name] Primary Care Provider: [name] Other Clinician: Massie Kluver Referring Provider: Treating Provider/Extender: Laural Roes in Treatment: 8 Active Problems ICD-10 Encounter Code Description Active Date MDM Diagnosis L97.818 Non-pressure chronic ulcer of other part of right lower leg  with other specified 08/25/2022 No Yes severity T79.8XXA Other early complications of trauma, initial encounter 08/25/2022 No Yes T79.2XXA Traumatic secondary and recurrent hemorrhage and seroma, initial encounter 08/25/2022 No Yes [name] [name], [name] [name] ([id number]) [id number] Page 5 of 8 E11.622 Type 2 diabetes mellitus with other skin ulcer 08/25/2022 No Yes E03.9 Hypothyroidism, unspecified 08/25/2022 No Yes I10 Essential (primary) hypertension 08/25/2022 No Yes I87.311 Chronic venous hypertension (idiopathic) with ulcer of right lower extremity 08/25/2022 No Yes Inactive Problems Resolved Problems Electronic Signature(s) Signed: 10/20/2022 3:55:12 PM By: Kalman Shan DO Entered By: Kalman Shan on 10/20/2022 15:51:21 -------------------------------------------------------------------------------- Progress Note Details Patient Name: Date of Service: [name] [name] 10/20/2022 2:15 PM Medical Record Number: [id number] Patient Account Number: [id number] Date of Birth/Sex: Treating RN: [birth year] (81 y.o. [name] [name] Primary Care Provider: [name] Other Clinician: Massie Kluver Referring Provider: Treating Provider/Extender: Laural Roes in Treatment: 8 Subjective Chief Complaint Information obtained from Patient 08/25/2022; right lower extremity wounds secondary to traumatic hematoma History of Present Illness (HPI) Admission 08/25/2022 Ms. [name] [name] is an 81 year old female with a past medical history of breast cancer, with insulin dependent, controlled type 2 diabetes, chronic venous insufficiency that presents to the clinic for a 7-monthhistory of nonhealing ulcer to the right lower extremity. She states she hit her leg against a stair and developed a hematoma. The area was evacuated by her primary care physician. She has tried Neosporin and wet-to-dry dressings. She reports mild chronic pain to the area. She denies  increased warmth, erythema or purulent drainage. 9/20; patient presents for follow-up. She had an x-ray of the right leg done at last clinic visit that did not show Evidence of osteomyelitis. She has been doing Dakin's wet-to-dry dressings without issues. She denies signs of infection. 9/27; patient presents for follow-up. Hydrofera Blue with antibiotic ointment was used under Kerlix/Coban at last clinic visit. She tolerated this well. She has no issues or complaints today. She denies signs of infection. 10/4; patient presents for follow-up. We have been using Hydrofera Blue to the wound bed all under Kerlix/Coban. She has no issues or complaints today. She has tolerated this well. 10/11; patient presents for follow-up. We have been using Hydrofera Blue to the wound bed under Kerlix/Coban. She has tolerated this well and has no issues or complaints. 10/18; patient presents for follow-up. We have been using Hydrofera Blue with Santyl to the wound bed under Kerlix/Coban. She has no issues or complaints today. 10/25; this is a patient with a right lower extremity medial leg ulcer in the setting of fairly severe chronic venous insufficiency. She has been using Santyl Hydrofera Blue rope Hydrofera Blue under kerlix Coban. She presents with 3  tunneling areas in the wound. [name] [name], [name] ([id number]) [id number] Page 6 of 8 11/1; this is a patient with a difficult right lower extremity medial leg ulcer in the setting of fairly clear chronic venous hypertension, hemosiderin deposition. This was initially traumatic in the setting of a fairly clear hematoma looking at cell phone pictures today. We have been using Hydrofera Blue under compression. 11/8; patient presents for follow-up. We have been using Hydrofera Blue blue under compression therapy. She has no issues or complaints today. Snap VAC was recommended at last clinic visit and this was run through insurance. We will ask  for clarification on cost. Objective Constitutional Vitals Time Taken: 2:43 PM, Height: 65 in, Weight: 172 lbs, BMI: 28.6, Temperature: 98.4 F, Pulse: 95 bpm, Respiratory Rate: 18 breaths/min, Blood Pressure: 178/88 mmHg. General Notes: Right lower extremity: T the distal medial aspect there is an open wound with granulation tissue and nonviable tissue With 2 tunnels of o increased depth. No surrounding soft tissue infection including increased warmth, erythema or purulent drainage. Venous stasis dermatitis. Good edema control. Integumentary (Hair, Skin) Wound #1 status is Open. Original cause of wound was Trauma. The date acquired was: 06/27/2022. The wound has been in treatment 8 weeks. The wound is located on the Right,Medial Lower Leg. The wound measures 3cm length x 0.5cm width x 2.1cm depth; 1.178cm^2 area and 2.474cm^3 volume. There is Fat Layer (Subcutaneous Tissue) exposed. There is no undermining noted, however, there is tunneling at 12:00 with a maximum distance of 0.7cm. There is additional tunneling at 2:00 with a maximum distance of 0.7cm, and at 3:00 with a maximum distance of 1.9cm. There is a medium amount of serosanguineous drainage noted. The wound margin is thickened. There is small (1-33%) red granulation within the wound bed. There is a large (67-100%) amount of necrotic tissue within the wound bed including Eschar and Adherent Slough. 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 I debrided  nonviable tissue. I recommended continuing course with Hydrofera Blue and an antibiotic ointment to address any bioburden. Continue compression therapy. Follow-up in 1 week. Procedures Wound #1 Pre-procedure diagnosis of Wound #1 is a Diabetic Wound/Ulcer of the Lower Extremity located on the Right,Medial Lower Leg .Severity of Tissue Pre Debridement is: Fat layer exposed. There was a Excisional Skin/Subcutaneous Tissue Debridement with a total area of 0.5 sq cm performed by Kalman Shan, MD. With the following instrument(s): Curette to remove Viable and Non-Viable tissue/material. Material removed includes Subcutaneous Tissue and Slough and. A time out was conducted at 15:09, prior to the start of the procedure. A Minimum amount of bleeding was controlled with Pressure. The procedure was tolerated well. Post Debridement Measurements: 3cm length x 0.5cm width x 2.1cm depth; 2.474cm^3 volume. Character of Wound/Ulcer Post Debridement is stable. Severity of Tissue Post Debridement is: Fat layer exposed. Post procedure Diagnosis Wound #1: Same as Pre-Procedure Pre-procedure diagnosis of Wound #1 is a Diabetic Wound/Ulcer of the Lower Extremity located on the Right,Medial Lower Leg . There was a Three Layer Compression Therapy Procedure with a pre-treatment ABI of 0.9 by Massie Kluver. Post procedure Diagnosis Wound #1: Same as Pre-Procedure  Plan Follow-up Appointments: Wound #1 Right,Medial Lower Leg: Return Appointment in 1 week. Bathing/ Shower/ Hygiene: [name] [name], [name] [name]) [id number] Page 7 of 8 May shower with wound dressing protected with water repellent cover or cast protector. No tub bath. Edema Control - Lymphedema / Segmental Compressive Device / Other: 3 Layer Compression System for Lymphedema. - right lower leg Elevate legs to the level of the heart and pump ankles as often as possible Negative Pressure Wound Therapy: Wound #1 Right,Medial  Lower Leg: Other: - verify vac with insurance WOUND #1: - Lower Leg Wound Laterality: Right, Medial Cleanser: Dakin 16 (oz) 0.25 1 x Per Week/30 Days Discharge Instructions: Use as directed. Topical: Gentamicin 1 x Per Week/30 Days Discharge Instructions: Apply very small amount to hydrofera rope and place into tunnels Topical: Mupirocin Ointment 1 x Per Week/30 Days Discharge Instructions: Apply very small amount to hydrofera rope and place into tunnels Prim Dressing: Hydrofera Blue Ready Transfer Foam, 4x5 (in/in) 1 x Per Week/30 Days ary Discharge Instructions: Apply Hydrofera Blue Ready to wound bed as directed Prim Dressing: Hydrofera Blue Classic Foam Rope Dressing, 9x6 (mm/in) 1 x Per Week/30 Days ary Discharge Instructions: lightly pack into wounds Secondary Dressing: Zetuvit Plus 4x8 (in/in) 1 x Per Week/30 Days Com pression Wrap: 3-LAYER WRAP - Profore Lite LF 3 Multilayer Compression Bandaging System 1 x Per Week/30 Days Discharge Instructions: Apply 3 multi-layer wrap as prescribed. 1. In office sharp debridement 2. Hydrofera Blue with antibiotic ointment under 3 layer compression 3. Follow-up in 1 week Electronic Signature(s) Signed: 10/20/2022 3:55:12 PM By: Kalman Shan DO Entered By: Kalman Shan on 10/20/2022 15:54:15 -------------------------------------------------------------------------------- SuperBill Details Patient Name: Date of Service: [name] [name] 10/20/2022 Medical Record Number: [id number] Patient Account Number: [id number] Date of Birth/Sex: Treating RN: 14-Jun-1941 (81 y.o. [name] [name] Primary Care Provider: [name] Other Clinician: Massie Kluver Referring Provider: Treating Provider/Extender: Laural Roes in Treatment: 8 Diagnosis Coding ICD-10 Codes Code Description [phone] Non-pressure chronic ulcer of other part of right lower leg with other specified severity T79.8XXA Other early complications of  trauma, initial encounter T64.2XXA Traumatic secondary and recurrent hemorrhage and seroma, initial encounter E11.622 Type 2 diabetes mellitus with other skin ulcer E03.9 Hypothyroidism, unspecified I10 Essential (primary) hypertension I87.311 Chronic venous hypertension (idiopathic) with ulcer of right lower extremity Facility Procedures : LATIFAH, PADIN Code: [id number] [place] [name] ([id number] Description: 11042 - DEB SUBQ TISSUE 20 SQ CM/< ICD-10 Diagnosis Description L97.818 Non-pressure chronic ulcer of other part of right lower leg with other specified T79.8XXA Other early complications of trauma, initial encounter E11.622 Type 2 diabetes  mellitus with other skin ulcer 85) [phone] Modifier: severity [id number] Quantity: 1 7.pdf Page 8 of 8 Physician Procedures : CPT4 Code Description Modifier [id number] 11042 - WC PHYS SUBQ TISS 20 SQ CM ICD-10 Diagnosis Description L97.818 Non-pressure chronic ulcer of other part of right lower leg with other specified severity T79.8XXA Other early complications of trauma,  initial encounter E11.622 Type 2 diabetes mellitus with other skin ulcer Quantity: 1 Electronic Signature(s) Signed: 10/20/2022 3:55:12 PM By: Kalman Shan DO Entered By: Kalman Shan on 10/20/2022 15:54:37

## 2022-10-21 NOTE — Progress Notes (Signed)
SHELLEE, STRENG ([id number]) [id number] Page 1 of 10 Visit Report for 10/20/2022 Arrival Information Details Patient Name: Date of Service: [name], [name] 10/20/2022 2:15 PM Medical Record Number: [id number] Patient Account Number: [id number] Date of Birth/Sex: Treating RN: 1941-06-09 (81 y.o. Charolette Forward, Kim Primary Care Provider: [name] Other Clinician: Massie Kluver Referring Provider: Treating Provider/Extender: Laural Roes in Treatment: 8 Visit Information History Since Last Visit All ordered tests and consults were completed: No Patient Arrived: Ambulatory Added or deleted any medications: No Arrival Time: 14:35 Any new allergies or adverse reactions: No Transfer Assistance: None Had a fall or experienced change in No Patient Requires Transmission-Based Precautions: No activities of daily living that may affect Patient Has Alerts: Yes risk of falls: Patient Alerts: Patient on Blood Thinner Signs or symptoms of abuse/neglect since last visito No 88m aspirin Hospitalized since last visit: No Type II Diabetic Implantable device outside of the clinic excluding No cellular tissue based products placed in the center since last visit: Pain Present Now: No Electronic Signature(s) Signed: 10/21/2022 12:52:59 PM By: VMassie KluverEntered By: VMassie Kluveron 10/20/2022 14:42:41 -------------------------------------------------------------------------------- Clinic Level of Care Assessment Details Patient Name: Date of Service: [name], RITTER11/07/2022 2:15 PM Medical Record Number: [id number] Account Number: [id number] of Birth/Sex: Treating RN: 6October 25, 1942(81y.o. FMarlowe ShoresPrimary Care Provider: ZMia CreekOther Clinician: VMassie KluverReferring Provider: Treating Provider/Extender: HLaural Roesin Treatment: 8 Clinic Level of Care Assessment Items TOOL 1  Quantity Score [] - 0 Use when EandM and Procedure is performed on INITIAL visit ASSESSMENTS - Nursing Assessment / Reassessment [] - 0 General Physical Exam (combine w/ comprehensive assessment (listed just below) when performed on new pt. evals) [] - 0 Comprehensive Assessment (HX, ROS, Risk Assessments, Wounds Hx, etc.) EDAVETTE, NUGENTB ([id number] Page 2 of 10 ASSESSMENTS - Wound and Skin Assessment / Reassessment [] - 0 Dermatologic / Skin Assessment (not related to wound area) ASSESSMENTS - Ostomy and/or Continence Assessment and Care [] - 0 Incontinence Assessment and Management [] - 0 Ostomy Care Assessment and Management (repouching, etc.) PROCESS - Coordination of Care [] - 0 Simple Patient / Family Education for ongoing care [] - 0 Complex (extensive) Patient / Family Education for ongoing care [] - 0 Staff obtains CProgrammer, systems Records, T Results / Process Orders est [] - 0 Staff telephones HHA, Nursing Homes / Clarify orders / etc [] - 0 Routine Transfer to another Facility (non-emergent condition) [] - 0 Routine Hospital Admission (non-emergent condition) [] - 0 New Admissions / IBiomedical engineer/ Ordering NPWT Apligraf, etc. , [] - 0 Emergency Hospital Admission (emergent condition) PROCESS - Special Needs [] - 0 Pediatric / Minor Patient Management [] - 0 Isolation Patient Management [] - 0 Hearing / Language / Visual special needs [] - 0 Assessment of Community assistance (transportation, D/C planning, etc.) [] - 0 Additional assistance / Altered mentation [] - 0 Support Surface(s) Assessment (bed, cushion, seat, etc.) INTERVENTIONS - Miscellaneous [] - 0 External ear exam [] - 0 Patient Transfer (multiple staff / HCivil Service fast streamer/ Similar devices) [] - 0 Simple Staple / Suture removal (25 or less) [] - 0 Complex Staple / Suture removal (26 or more) [] - 0 Hypo/Hyperglycemic Management (do not check if  billed separately) [] - 0 Ankle / Brachial Index (ABI) - do not check if billed separately Has the patient been seen at the hospital within  the last three years: Yes Total Score: 0 Level Of Care: ____ Electronic Signature(s) Signed: 10/21/2022 12:52:59 PM By: Massie Kluver Entered By: Massie Kluver on 10/20/2022 15:13:59 -------------------------------------------------------------------------------- Compression Therapy Details Patient Name: Date of Service: [name] 10/20/2022 2:15 PM Medical Record Number: [id number] Patient Account Number: [id number] Date of Birth/Sex: Treating RN: 03/29/1941 (81 y.o. [name] Primary Care Provider: [name] Other Clinician: Massie Kluver Referring Provider: Treating Provider/Extender: Laural Roes in Treatment: [place], [place] ([id number]) [id number] Page 3 of 10 Compression Therapy Performed for Wound Assessment: Wound #1 Right,Medial Lower Leg Performed By: Lenice Pressman, [name], Compression Type: Three Layer Pre Treatment ABI: 0.9 Post Procedure Diagnosis Same as Pre-procedure Electronic Signature(s) Signed: 10/21/2022 12:52:59 PM By: Massie Kluver Entered By: Massie Kluver on 10/20/2022 15:12:20 -------------------------------------------------------------------------------- Encounter Discharge Information Details Patient Name: Date of Service: [name] 10/20/2022 2:15 PM Medical Record Number: [id number] Patient Account Number: [id number] Date of Birth/Sex: Treating RN: 1941-08-10 (81 y.o. [name] Primary Care Provider: [name] Other Clinician: Massie Kluver Referring Provider: Treating Provider/Extender: Laural Roes in Treatment: 8 Encounter Discharge Information Items Post Procedure Vitals Discharge Condition: Stable Temperature (F): 98.4 Ambulatory Status: Ambulatory Pulse (bpm): 95 Discharge  Destination: Home Respiratory Rate (breaths/min): 18 Transportation: Private Auto Blood Pressure (mmHg): 178/88 Accompanied By: daughter Schedule Follow-up Appointment: Yes Clinical Summary of Care: Electronic Signature(s) Signed: 10/21/2022 12:52:59 PM By: Massie Kluver Entered By: Massie Kluver on 10/20/2022 16:42:41 -------------------------------------------------------------------------------- Lower Extremity Assessment Details Patient Name: Date of Service: [name], [name] 10/20/2022 2:15 PM Medical Record Number: [id number] Patient Account Number: [id number] Date of Birth/Sex: Treating RN: 21-Aug-1941 (81 y.o. [name] Primary Care Provider: [name] Other Clinician: Massie Kluver Referring Provider: Treating Provider/Extender: Laural Roes in Treatment: 8 Edema Assessment E[Left: ESRAA, SERES ([id number])] [Right: [id number] Page 4 of 10] Assessed: [Left: No] [Right: Yes] Edema: [Left: Ye] [Right: s] Calf Left: Right: Point of Measurement: 30 cm From Medial Instep 32.2 cm Ankle Left: Right: Point of Measurement: 10 cm From Medial Instep 21.5 cm Vascular Assessment Pulses: Dorsalis Pedis Palpable: [Right:Yes] Electronic Signature(s) Signed: 10/20/2022 4:34:32 PM By: Gretta Cool, BSN, RN, CWS, Kim RN, BSN Signed: 10/21/2022 12:52:59 PM By: Massie Kluver Entered By: Massie Kluver on 10/20/2022 14:59:48 -------------------------------------------------------------------------------- Multi Wound Chart Details Patient Name: Date of Service: [name] 10/20/2022 2:15 PM Medical Record Number: [id number] Patient Account Number: [id number] Date of Birth/Sex: Treating RN: 10-01-1941 (81 y.o. [name] Primary Care Provider: [name] Other Clinician: Massie Kluver Referring Provider: Treating Provider/Extender: Laural Roes in Treatment: 8 Vital Signs Height(in):  65 Pulse(bpm): 95 Weight(lbs): 172 Blood Pressure(mmHg): 178/88 Body Mass Index(BMI): 28.6 Temperature(F): 98.4 Respiratory Rate(breaths/min): 18 [1:Photos:] [N/A:N/A] Right, Medial Lower Leg N/A N/A Wound Location: Trauma N/A N/A Wounding Event: Diabetic Wound/Ulcer of the Lower N/A N/A Primary Etiology: Extremity Hypertension, Type II Diabetes, N/A N/A Comorbid History: Osteoarthritis, Neuropathy, Received Chemotherapy, Received Radiation 06/27/2022 N/A N/A Date Acquired: 8 N/A N/A Weeks of Treatment: Open N/A N/A Wound Status: No N/A N/A Wound Recurrence: [name], [name] [name] ([id number]) [id number] Page 5 of 10 3x0.5x2.1 N/A N/A Measurements L x W x D (cm) 1.178 N/A N/A A (cm) : rea 2.474 N/A N/A Volume (cm) : 82.10% N/A N/A % Reduction in A rea: 76.60% N/A N/A % Reduction in Volume: 12 Position 1 (o'clock): 0.7 Maximum Distance 1 (cm): 2 Position 2 (o'clock): 0.7 Maximum Distance 2 (  cm): 3 Position 3 (o'clock): 1.9 Maximum Distance 3 (cm): Yes N/A N/A Tunneling: Grade 2 N/A N/A Classification: Medium N/A N/A Exudate A mount: Serosanguineous N/A N/A Exudate Type: red, brown N/A N/A Exudate Color: Thickened N/A N/A Wound Margin: Small (1-33%) N/A N/A Granulation A mount: Red N/A N/A Granulation Quality: Large (67-100%) N/A N/A Necrotic A mount: Eschar, Adherent Slough N/A N/A Necrotic Tissue: Fat Layer (Subcutaneous Tissue): Yes N/A N/A Exposed Structures: Fascia: No Tendon: No Muscle: No Joint: No Bone: No None N/A N/A Epithelialization: Treatment Notes Electronic Signature(s) Signed: 10/21/2022 12:52:59 PM By: Massie Kluver Entered By: Massie Kluver on 10/20/2022 15:00:08 -------------------------------------------------------------------------------- [place] Details Patient Name: Date of Service: [name] 10/20/2022 2:15 PM Medical Record Number: [id number] Patient  Account Number: [id number] Date of Birth/Sex: Treating RN: 1941/01/13 (81 y.o. [name] Primary Care Provider: [name] Other Clinician: Massie Kluver Referring Provider: Treating Provider/Extender: Laural Roes in Treatment: 8 Active Inactive Necrotic Tissue Nursing Diagnoses: Impaired tissue integrity related to necrotic/devitalized tissue Knowledge deficit related to management of necrotic/devitalized tissue Goals: Necrotic/devitalized tissue will be minimized in the wound bed Date Initiated: 08/25/2022 Target Resolution Date: 08/25/2022 Goal Status: Active Patient/caregiver will verbalize understanding of reason and process for debridement of necrotic tissue Date Initiated: 08/25/2022 Target Resolution Date: 08/25/2022 Goal Status: Active Interventions: Assess patient pain level pre-, during and post procedure and prior to discharge [name], [name] ([id number]) [id number] Page 6 of 10 Provide education on necrotic tissue and debridement process Treatment Activities: Excisional debridement : 08/25/2022 Notes: Orientation to the Wound Care Program Nursing Diagnoses: Knowledge deficit related to the wound healing center program Goals: Patient/caregiver will verbalize understanding of the [place] Date Initiated: 08/25/2022 Target Resolution Date: 08/25/2022 Goal Status: Active Interventions: Provide education on orientation to the wound center Notes: Pain, Acute or Chronic Nursing Diagnoses: Pain Management - Cyclic Acute (Dressing Change Related) Pain, acute or chronic: actual or potential Potential alteration in comfort, pain Goals: Patient will verbalize adequate pain control and receive pain control interventions during procedures as needed Date Initiated: 08/25/2022 Target Resolution Date: 08/25/2022 Goal Status: Active Patient/caregiver will verbalize adequate pain control between  visits Date Initiated: 08/25/2022 Target Resolution Date: 08/25/2022 Goal Status: Active Patient/caregiver will verbalize comfort level met Date Initiated: 08/25/2022 Target Resolution Date: 08/25/2022 Goal Status: Active Interventions: Provide education on pain management Reposition patient for comfort Treatment Activities: Administer pain control measures as ordered : 08/25/2022 Notes: Peripheral Neuropathy Nursing Diagnoses: Knowledge deficit related to disease process and management of peripheral neurovascular dysfunction Potential alteration in peripheral tissue perfusion (select prior to confirmation of diagnosis) Goals: Patient/caregiver will verbalize understanding of disease process and disease management Date Initiated: 08/25/2022 Target Resolution Date: 08/25/2022 Goal Status: Active Interventions: Assess signs and symptoms of neuropathy upon admission and as needed Provide education on Management of Neuropathy and Related Ulcers Notes: Wound/Skin Impairment Nursing Diagnoses: Impaired tissue integrity Knowledge deficit related to smoking impact on wound healing Knowledge deficit related to ulceration/compromised skin integrity Goals: Patient/caregiver will verbalize understanding of skin care regimen Date Initiated: 08/25/2022 Target Resolution Date: 08/25/2022 Goal Status: Active Ulcer/skin breakdown will have a volume reduction of 30% by week 4 [name], [name] ([id number]) [id number] Page 7 of 10 Date Initiated: 08/25/2022 Target Resolution Date: 09/22/2022 Goal Status: Active Interventions: Assess ulceration(s) every visit Provide education on smoking Notes: Electronic Signature(s) Signed: 10/20/2022 4:34:32 PM By: Gretta Cool, BSN, RN, CWS, Kim RN, BSN Signed: 10/21/2022 12:52:59 PM By: Massie Kluver Entered  By: Massie Kluver on 10/20/2022 14:59:59 -------------------------------------------------------------------------------- Pain  Assessment Details Patient Name: Date of Service: [name], [name] 10/20/2022 2:15 PM Medical Record Number: [id number] Patient Account Number: [id number] Date of Birth/Sex: Treating RN: [birth year] (81 y.o. [name] Primary Care Provider: [name] Other Clinician: Massie Kluver Referring Provider: Treating Provider/Extender: Laural Roes in Treatment: 8 Active Problems Location of Pain Severity and Description of Pain Patient Has Paino No Site Locations Pain Management and Medication Current Pain Management: Electronic Signature(s) Signed: 10/20/2022 4:34:32 PM By: Gretta Cool, BSN, RN, CWS, Kim RN, BSN Signed: 10/21/2022 12:52:59 PM By: Massie Kluver Entered By: Massie Kluver on 10/20/2022 14:48:39 [name], [name] ([id number]) [id number] Page 8 of 10 -------------------------------------------------------------------------------- Patient/Caregiver Education Details Patient Name: Date of Service: [name], [name] 11/8/2023andnbsp2:15 PM Medical Record Number: [id number] Patient Account Number: [id number] Date of Birth/Gender: Treating RN: 10/08/1941 (81 y.o. [name] Primary Care Physician: [name] Other Clinician: Massie Kluver Referring Physician: Treating Physician/Extender: Laural Roes in Treatment: 8 Education Assessment Education Provided To: Patient Education Topics Provided Wound/Skin Impairment: Handouts: Other: continue wound care as directed Methods: Explain/Verbal Responses: State content correctly Electronic Signature(s) Signed: 10/21/2022 12:52:59 PM By: Massie Kluver Entered By: Massie Kluver on 10/20/2022 15:14:25 -------------------------------------------------------------------------------- Wound Assessment Details Patient Name: Date of Service: [name] 10/20/2022 2:15 PM Medical Record Number: [id number] Patient Account Number:  [id number] Date of Birth/Sex: Treating RN: June 03, 1941 (81 y.o. [name] Primary Care Provider: [name] Other Clinician: Massie Kluver Referring Provider: Treating Provider/Extender: Laural Roes in Treatment: 8 Wound Status Wound Number: 1 Primary Diabetic Wound/Ulcer of the Lower Extremity Etiology: Wound Location: Right, Medial Lower Leg Wound Open Wounding Event: Trauma Status: Date Acquired: 06/27/2022 Comorbid Hypertension, Type II Diabetes, Osteoarthritis, Neuropathy, Weeks Of Treatment: 8 History: Received Chemotherapy, Received Radiation Clustered Wound: No Photos [name], [name] ([id number]) [id number] Page 9 of 10 Wound Measurements Length: (cm) 3 Width: (cm) 0.5 Depth: (cm) 2.1 Area: (cm) 1.178 Volume: (cm) 2.474 % Reduction in Area: 82.1% % Reduction in Volume: 76.6% Epithelialization: None Tunneling: Yes Location 1 Position (o'clock): 12 Maximum Distance: (cm) 0.7 Location 2 Position (o'clock): 2 Maximum Distance: (cm) 0.7 Location 3 Position (o'clock): 3 Maximum Distance: (cm) 1.9 Undermining: No Wound Description Classification: Grade 2 Wound Margin: Thickened Exudate Amount: Medium Exudate Type: Serosanguineous Exudate Color: red, brown Foul Odor After Cleansing: No Slough/Fibrino Yes Wound Bed Granulation Amount: Small (1-33%) Exposed Structure Granulation Quality: Red Fascia Exposed: No Necrotic Amount: Large (67-100%) Fat Layer (Subcutaneous Tissue) Exposed: Yes Necrotic Quality: Eschar, Adherent Slough Tendon Exposed: No Muscle Exposed: No Joint Exposed: No Bone Exposed: No Treatment Notes Wound #1 (Lower Leg) Wound Laterality: Right, Medial Cleanser Dakin 16 (oz) 0.25 Discharge Instruction: Use as directed. Peri-Wound Care Topical Gentamicin Discharge Instruction: Apply very small amount to hydrofera rope and place into tunnels Mupirocin Ointment Discharge  Instruction: Apply very small amount to hydrofera rope and place into tunnels Primary Dressing Hydrofera Blue Ready Transfer Foam, 4x5 (in/in) Discharge Instruction: Apply Hydrofera Blue Ready to wound bed as directed Hydrofera Blue Classic Foam Rope Dressing, 9x6 (mm/in) Discharge Instruction: lightly pack into wounds Secondary Dressing Zetuvit Plus 4x8 (in/in) Secured With Compression Wrap 3-LAYER WRAP - Profore Lite LF 3 Multilayer Compression [place], [place] ([id number]) [id number] Page 10 of 10 Discharge Instruction: Apply 3 multi-layer wrap as prescribed. Compression Stockings Add-Ons Electronic Signature(s) Signed: 10/20/2022 4:34:32 PM By: Gretta Cool, BSN, RN, CWS, Kim RN, BSN  Signed: 10/21/2022 12:52:59 PM By: Massie Kluver Entered By: Massie Kluver on 10/20/2022 14:58:52 -------------------------------------------------------------------------------- [place] Details Patient Name: Date of Service: [name] Puna [name]. 10/20/2022 2:15 PM Medical Record Number: [id number] Patient Account Number: [id number] Date of Birth/Sex: Treating RN: 03/14/1941 (81 y.o. Charolette Forward, Kim Primary Care Provider: [name] Other Clinician: Massie Kluver Referring Provider: Treating Provider/Extender: Laural Roes in Treatment: 8 Vital Signs Time Taken: 14:43 Temperature (F): 98.4 Height (in): 65 Pulse (bpm): 95 Weight (lbs): 172 Respiratory Rate (breaths/min): 18 Body Mass Index (BMI): 28.6 Blood Pressure (mmHg): 178/88 Reference Range: 80 - 120 mg / dl Electronic Signature(s) Signed: 10/21/2022 12:52:59 PM By: Massie Kluver Entered By: Massie Kluver on 10/20/2022 14:48:33

## 2022-10-27 ENCOUNTER — Encounter (HOSPITAL_BASED_OUTPATIENT_CLINIC_OR_DEPARTMENT_OTHER): Payer: Medicare PPO | Admitting: Internal Medicine

## 2022-10-27 DIAGNOSIS — I87311 Chronic venous hypertension (idiopathic) with ulcer of right lower extremity: Secondary | ICD-10-CM | POA: Diagnosis not present

## 2022-10-27 DIAGNOSIS — T798XXA Other early complications of trauma, initial encounter: Secondary | ICD-10-CM | POA: Diagnosis not present

## 2022-10-27 DIAGNOSIS — E11622 Type 2 diabetes mellitus with other skin ulcer: Secondary | ICD-10-CM | POA: Diagnosis not present

## 2022-10-27 DIAGNOSIS — L97818 Non-pressure chronic ulcer of other part of right lower leg with other specified severity: Secondary | ICD-10-CM

## 2022-10-28 NOTE — Progress Notes (Addendum)
SARAHELIZABETH, CONWAY ([id number]) [id number] Page 1 of 7 Visit Report for 10/27/2022 Chief Complaint Document Details Patient Name: Date of Service: [name], [name] 10/27/2022 2:15 PM Medical Record Number: [id number] Patient Account Number: [id number] Date of Birth/Sex: Treating RN: 10/06/1941 (81 y.o. [name] Primary Care Provider: Mia [name] Other Clinician: Massie [name] Referring Provider: Treating Provider/Extender: [name] in Treatment: 9 Information Obtained from: Patient Chief Complaint 08/25/2022; right lower extremity wounds secondary to traumatic hematoma Electronic Signature(s) Signed: 10/27/2022 4:43:59 PM By: [name] Shan DO Entered By: [name] on 10/27/2022 15:15:58 -------------------------------------------------------------------------------- HPI Details Patient Name: Date of Service: [name]. 10/27/2022 2:15 PM Medical Record Number: [id number] Patient Account Number: [id number] Date of Birth/Sex: Treating RN: Dec 10, 1941 (81 y.o. [name] Primary Care Provider: Mia [name] Other Clinician: Massie [name] Referring Provider: Treating Provider/Extender: [name] in Treatment: 9 History of Present Illness HPI Description: Admission 08/25/2022 [name] is an 81 year old female with a past medical history of breast cancer, with insulin dependent, controlled type 2 diabetes, chronic venous insufficiency that presents to the clinic for a 46-monthhistory of nonhealing ulcer to the right lower extremity. She states she hit her leg against a stair and developed a hematoma. The area was evacuated by her primary care physician. She has tried Neosporin and wet-to-dry dressings. She reports mild chronic pain to the area. She denies increased warmth, erythema or purulent drainage. 9/20; patient presents for follow-up. She had an x-ray of the  right leg done at last clinic visit that did not show Evidence of osteomyelitis. She has been doing Dakin's wet-to-dry dressings without issues. She denies signs of infection. 9/27; patient presents for follow-up. Hydrofera Blue with antibiotic ointment was used under Kerlix/Coban at last clinic visit. She tolerated this well. She has no issues or complaints today. She denies signs of infection. 10/4; patient presents for follow-up. We have been using Hydrofera Blue to the wound bed all under Kerlix/Coban. She has no issues or complaints today. She has tolerated this well. 10/11; patient presents for follow-up. We have been using Hydrofera Blue to the wound bed under Kerlix/Coban. She has tolerated this well and has no issues or complaints. EBYRDIE, MIYAZAKI([id number] Page 2 of 7 10/18; patient presents for follow-up. We have been using Hydrofera Blue with Santyl to the wound bed under Kerlix/Coban. She has no issues or complaints today. 10/25; this is a patient with a right lower extremity medial leg ulcer in the setting of fairly severe chronic venous insufficiency. She has been using Santyl Hydrofera Blue rope Hydrofera Blue under kerlix Coban. She presents with 3 tunneling areas in the wound. 11/1; this is a patient with a difficult right lower extremity medial leg ulcer in the setting of fairly clear chronic venous hypertension, hemosiderin deposition. This was initially traumatic in the setting of a fairly clear hematoma looking at cell phone pictures today. We have been using Hydrofera Blue under compression. 11/8; patient presents for follow-up. We have been using Hydrofera Blue blue under compression therapy. She has no issues or complaints today. Snap VAC was recommended at last clinic visit and this was run through insurance. We will ask for clarification on cost. 11/15; patient presents for follow-up. We have been using Hydrofera Blue with  antibiotic ointment under compression therapy. Patient has no issues or complaints today. Electronic Signature(s) Signed: 10/27/2022 4:43:59 PM By: HKalman ShanDO Entered By: HKalman Shanon 10/27/2022 15:16:41 -------------------------------------------------------------------------------- Physical  Exam Details Patient Name: Date of Service: [name], [name] 10/27/2022 2:15 PM Medical Record Number: [id number] Patient Account Number: [id number] Date of Birth/Sex: Treating RN: 24-Apr-1941 (81 y.o. [name] Primary Care Provider: Mia [name] Other Clinician: Massie [name] Referring Provider: Treating Provider/Extender: [name] in Treatment: 9 Constitutional . Cardiovascular . Psychiatric . Notes Right lower extremity: T the distal medial aspect there Are 2 open wounds with tunneling. Granulation tissue at the opening. No surrounding soft tissue o infection including increased warmth, erythema or purulent drainage. Venous stasis dermatitis. Good edema control. Electronic Signature(s) Signed: 10/27/2022 4:43:59 PM By: [name] Shan DO Entered By: [name] on 10/27/2022 15:20:13 Physician Orders Details -------------------------------------------------------------------------------- [name] ([id number]) [id number] Page 3 of 7 Patient Name: Date of Service: [name], [name] 10/27/2022 2:15 PM Medical Record Number: [id number] Patient Account Number: [id number] Date of Birth/Sex: Treating RN: 06/23/1941 (81 y.o. [name] Primary Care Provider: Mia [name] Other Clinician: Massie [name] Referring Provider: Treating Provider/Extender: [name] in Treatment: 9 Verbal / Phone Orders: No Diagnosis Coding Follow-up Appointments Wound #1 Right,Medial Lower Leg Return Appointment in 1 week. Bathing/ Shower/ Hygiene May shower with wound dressing  protected with water repellent cover or cast protector. No tub bath. Edema Control - Lymphedema / Segmental Compressive Device / Other 3 Layer Compression System for Lymphedema. - right lower leg Elevate legs to the level of the heart and pump ankles as often as possible Wound Treatment Wound #1 - Lower Leg Wound Laterality: Right, Medial Cleanser: Dakin 16 (oz) 0.25 1 x Per Week/30 Days Discharge Instructions: Use as directed. Topical: Gentamicin 1 x Per Week/30 Days Discharge Instructions: Apply very small amount to hydrofera rope and place into tunnels Topical: Mupirocin Ointment 1 x Per Week/30 Days Discharge Instructions: Apply very small amount to hydrofera rope and place into tunnels Prim Dressing: AquacelAg Advantage Dressing, 2X2 (in/in) 1 x Per Week/30 Days ary Discharge Instructions: Apply to wound as directed Secondary Dressing: Zetuvit Plus 4x8 (in/in) 1 x Per Week/30 Days Compression Wrap: 3-LAYER WRAP - Profore Lite LF 3 Multilayer Compression Bandaging System 1 x Per Week/30 Days Discharge Instructions: Apply 3 multi-layer wrap as prescribed. Electronic Signature(s) Signed: 10/28/2022 5:06:55 PM By: [name] Signed: 10/28/2022 5:35:14 PM By: [name] Shan DO Previous Signature: 10/27/2022 4:43:59 PM Version By: [name] Shan DO Entered By: [name] on 10/27/2022 16:55:53 -------------------------------------------------------------------------------- Problem List Details Patient Name: Date of Service: [name] 10/27/2022 2:15 PM Medical Record Number: [id number] Patient Account Number: [id number] Date of Birth/Sex: Treating RN: January 19, 1941 (81 y.o. [name] Primary Care Provider: Mia [name] Other Clinician: Massie [name] Referring Provider: Treating Provider/Extender: [name] in Treatment: [address] Problems ICD-10 RASHEEN, SCHEWE ([id number]) [id number] Page 4 of  7 Encounter Code Description Active Date MDM Diagnosis L97.818 Non-pressure chronic ulcer of other part of right lower leg with other specified 08/25/2022 No Yes severity T79.8XXA Other early complications of trauma, initial encounter 08/25/2022 No Yes T79.2XXA Traumatic secondary and recurrent hemorrhage and seroma, initial encounter 08/25/2022 No Yes E11.622 Type 2 diabetes mellitus with other skin ulcer 08/25/2022 No Yes E03.9 Hypothyroidism, unspecified 08/25/2022 No Yes I10 Essential (primary) hypertension 08/25/2022 No Yes I87.311 Chronic venous hypertension (idiopathic) with ulcer of right lower extremity 08/25/2022 No Yes Inactive Problems Resolved Problems Electronic Signature(s) Signed: 10/27/2022 4:43:59 PM By: [name] Shan DO Entered By: [name] on 10/27/2022 15:15:52 -------------------------------------------------------------------------------- Progress Note Details Patient Name: Date of  Service: [name], [name] 10/27/2022 2:15 PM Medical Record Number: [id number] Patient Account Number: [id number] Date of Birth/Sex: Treating RN: 01/28/1941 (81 y.o. [name] Primary Care Provider: Mia [name] Other Clinician: Massie [name] Referring Provider: Treating Provider/Extender: [name] in Treatment: 9 Subjective Chief Complaint Information obtained from Patient 08/25/2022; right lower extremity wounds secondary to traumatic hematoma History of Present Illness (HPI) Admission 08/25/2022 Ms. Braniyah Besse is an 81 year old female with a past medical history of breast cancer, with insulin dependent, controlled type 2 diabetes, chronic venous insufficiency that presents to the clinic for a 7-monthhistory of nonhealing ulcer to the right lower extremity. She states she hit her leg against a stair and developed a hematoma. The area was evacuated by her primary care physician. She has tried Neosporin and wet-to-dry dressings. She  reports mild chronic pain to the area. She denies increased warmth, erythema or purulent drainage. [name], [name]([id number] Page 5 of 7 9/20; patient presents for follow-up. She had an x-ray of the right leg done at last clinic visit that did not show Evidence of osteomyelitis. She has been doing Dakin's wet-to-dry dressings without issues. She denies signs of infection. 9/27; patient presents for follow-up. Hydrofera Blue with antibiotic ointment was used under Kerlix/Coban at last clinic visit. She tolerated this well. She has no issues or complaints today. She denies signs of infection. 10/4; patient presents for follow-up. We have been using Hydrofera Blue to the wound bed all under Kerlix/Coban. She has no issues or complaints today. She has tolerated this well. 10/11; patient presents for follow-up. We have been using Hydrofera Blue to the wound bed under Kerlix/Coban. She has tolerated this well and has no issues or complaints. 10/18; patient presents for follow-up. We have been using Hydrofera Blue with Santyl to the wound bed under Kerlix/Coban. She has no issues or complaints today. 10/25; this is a patient with a right lower extremity medial leg ulcer in the setting of fairly severe chronic venous insufficiency. She has been using Santyl Hydrofera Blue rope Hydrofera Blue under kerlix Coban. She presents with 3 tunneling areas in the wound. 11/1; this is a patient with a difficult right lower extremity medial leg ulcer in the setting of fairly clear chronic venous hypertension, hemosiderin deposition. This was initially traumatic in the setting of a fairly clear hematoma looking at cell phone pictures today. We have been using Hydrofera Blue under compression. 11/8; patient presents for follow-up. We have been using Hydrofera Blue blue under compression therapy. She has no issues or complaints today. Snap VAC was recommended at last clinic  visit and this was run through insurance. We will ask for clarification on cost. 11/15; patient presents for follow-up. We have been using Hydrofera Blue with antibiotic ointment under compression therapy. Patient has no issues or complaints today. Objective Constitutional Vitals Time Taken: 2:33 PM, Weight: 172 lbs, Temperature: 97.6 F, Pulse: 86 bpm, Respiratory Rate: 18 breaths/min, Blood Pressure: 183/94 mmHg. General Notes: Right lower extremity: T the distal medial aspect there Are 2 open wounds with tunneling. Granulation tissue at the opening. No surrounding o soft tissue infection including increased warmth, erythema or purulent drainage. Venous stasis dermatitis. Good edema control. Integumentary (Hair, Skin) Wound #1 status is Open. Original cause of wound was Trauma. The date acquired was: 06/27/2022. The wound has been in treatment 9 weeks. The wound is located on the Right,Medial Lower Leg. The wound measures 2.1cm length x 0.3cm width x 2.2cm depth;  0.495cm^2 area and 1.089cm^3 volume. There is Fat Layer (Subcutaneous Tissue) exposed. There is tunneling at 12:00 with a maximum distance of 0.3cm. There is additional tunneling and at 3:00 with a maximum distance of 2.2cm. There is a medium amount of serosanguineous drainage noted. The wound margin is thickened. There is small (1-33%) red granulation within the wound bed. There is a large (67-100%) amount of necrotic tissue within the wound bed including Eschar and Adherent Slough. Assessment Active Problems ICD-10 Non-pressure chronic ulcer of other part of right lower leg with other specified severity Other early complications of trauma, initial encounter Traumatic secondary and recurrent hemorrhage and seroma, initial encounter Type 2 diabetes mellitus with other skin ulcer Hypothyroidism, unspecified Essential (primary) hypertension Chronic venous hypertension (idiopathic) with ulcer of right lower extremity Patient's wound  has improved in size and appearance since last clinic visit. Now there are 2 small open areas however these tunnel. Granulation tissue at the opening. At this time I recommended switching the dressing to silver alginate T pack the tunneling but continuing with antibiotic ointment under compression o therapy. Procedures Wound #1 Pre-procedure diagnosis of Wound #1 is a Diabetic Wound/Ulcer of the Lower Extremity located on the Right,Medial Lower Leg . There was a Three Layer Compression Therapy Procedure with a pre-treatment ABI of 0.9 by [name]. Post procedure Diagnosis Wound #1: Same as Pre-Procedure [name], [name] ([id number]) [id number] Page 6 of 7 Plan Follow-up Appointments: Wound #1 Right,Medial Lower Leg: Return Appointment in 1 week. Bathing/ Shower/ Hygiene: May shower with wound dressing protected with water repellent cover or cast protector. No tub bath. Edema Control - Lymphedema / Segmental Compressive Device / Other: 3 Layer Compression System for Lymphedema. - right lower leg Elevate legs to the level of the heart and pump ankles as often as possible Negative Pressure Wound Therapy: Wound #1 Right,Medial Lower Leg: Other: - verify vac with insurance WOUND #1: - Lower Leg Wound Laterality: Right, Medial Cleanser: Dakin 16 (oz) 0.25 1 x Per Week/30 Days Discharge Instructions: Use as directed. Topical: Gentamicin 1 x Per Week/30 Days Discharge Instructions: Apply very small amount to hydrofera rope and place into tunnels Topical: Mupirocin Ointment 1 x Per Week/30 Days Discharge Instructions: Apply very small amount to hydrofera rope and place into tunnels Prim Dressing: AquacelAg Advantage Dressing, 2X2 (in/in) 1 x Per Week/30 Days ary Discharge Instructions: Apply to wound as directed Secondary Dressing: Zetuvit Plus 4x8 (in/in) 1 x Per Week/30 Days Com pression Wrap: 3-LAYER WRAP - Profore Lite LF 3 Multilayer Compression  Bandaging System 1 x Per Week/30 Days Discharge Instructions: Apply 3 multi-layer wrap as prescribed. 1. Silver alginate with antibiotic ointment under 3 layer compression 2. Follow-up in 1 week Electronic Signature(s) Signed: 10/27/2022 4:43:59 PM By: [name] Shan DO Entered By: [name] on 10/27/2022 15:22:13 -------------------------------------------------------------------------------- SuperBill Details Patient Name: Date of Service: [name] 10/27/2022 Medical Record Number: [id number] Patient Account Number: [id number] Date of Birth/Sex: Treating RN: 03-01-1941 (81 y.o. [name] Primary Care Provider: Mia [name] Other Clinician: Massie [name] Referring Provider: Treating Provider/Extender: [name] in Treatment: 9 Diagnosis Coding ICD-10 Codes Code Description [phone] Non-pressure chronic ulcer of other part of right lower leg with other specified severity T79.8XXA Other early complications of trauma, initial encounter T75.2XXA Traumatic secondary and recurrent hemorrhage and seroma, initial encounter E11.622 Type 2 diabetes mellitus with other skin ulcer E03.9 Hypothyroidism, unspecified I10 Essential (primary) hypertension I87.311 Chronic venous hypertension (idiopathic) with ulcer of right lower  extremity Facility Procedures : [name], [name] [name] [phone] [id number] (F IC L Description: 074) [id number] acility Use Only) [id number] - APPLY MULTLAY COMPRS LWR RT LEG D-10 Diagnosis Description 97.818 Non-pressure chronic ulcer of other part of right lower leg with other specified severity Modifier: [id number] 1 Quantity: .pdf Page 7 of 7 Physician Procedures : CPT4 Code Description Modifier [id number] - WC PHYS LEVEL 3 - EST PT ICD-10 Diagnosis Description L97.818 Non-pressure chronic ulcer of other part of right lower leg with other specified severity T79.8XXA Other early complications of  trauma, initial  encounter E11.622 Type 2 diabetes mellitus with other skin ulcer I87.311 Chronic venous hypertension (idiopathic) with ulcer of right lower extremity Quantity: 1 Electronic Signature(s) Signed: 10/27/2022 4:43:59 PM By: [name] Shan DO Entered By: [name] on 10/27/2022 15:22:35

## 2022-10-29 NOTE — Progress Notes (Signed)
[name], [name] ([id number]) [id number] Page 1 of 10 Visit Report for 10/27/2022 Arrival Information Details Patient Name: Date of Service: [name], [name] 10/27/2022 2:15 PM Medical Record Number: [id number] Patient Account Number: [id number] Date of Birth/Sex: Treating RN: [birth year] (81 y.o. [name] Primary Care Phenix Vandermeulen: [name] Other Clinician: Massie Kluver Referring Mayci Haning: Treating Joeseph Verville/Extender: Laural Roes in Treatment: 9 Visit Information History Since Last Visit All ordered tests and consults were completed: No Patient Arrived: Ambulatory Added or deleted any medications: No Arrival Time: 14:30 Any new allergies or adverse reactions: No Transfer Assistance: None Had a fall or experienced change in No Patient Requires Transmission-Based Precautions: No activities of daily living that may affect Patient Has Alerts: Yes risk of falls: Patient Alerts: Patient on Blood Thinner Signs or symptoms of abuse/neglect since last visito No 2m aspirin Hospitalized since last visit: No Type II Diabetic Implantable device outside of the clinic excluding No cellular tissue based products placed in the center since last visit: Pain Present Now: No Electronic Signature(s) Signed: 10/28/2022 5:06:55 PM By: VMassie KluverEntered By: VMassie Kluveron 10/27/2022 14:31:48 -------------------------------------------------------------------------------- Clinic Level of Care Assessment Details Patient Name: Date of Service: [name], [name]/15/2023 2:15 PM Medical Record Number: [id number] Account Number: [id number] of Birth/Sex: Treating RN: [birth year](81y.o. FDrema PryPrimary Care Zakkary Thibault: ZMia CreekOther Clinician: VMassie KluverReferring Ashling Roane: Treating Susi Goslin/Extender: HLaural Roesin Treatment: 9 Clinic Level of Care Assessment Items TOOL  1 Quantity Score _0  - 0 Use when EandM and Procedure is performed on INITIAL visit ASSESSMENTS - Nursing Assessment / Reassessment _1  - 0 General Physical Exam (combine w/ comprehensive assessment (listed just below) when performed on new pt. evals) _2  - 0 Comprehensive Assessment (HX, ROS, Risk Assessments, Wounds Hx, etc.) [name], [name] ([id number] Page 2 of 10 ASSESSMENTS - Wound and Skin Assessment / Reassessment _3  - 0 Dermatologic / Skin Assessment (not related to wound area) ASSESSMENTS - Ostomy and/or Continence Assessment and Care _4  - 0 Incontinence Assessment and Management _5  - 0 Ostomy Care Assessment and Management (repouching, etc.) PROCESS - Coordination of Care _6  - 0 Simple Patient / Family Education for ongoing care _7  - 0 Complex (extensive) Patient / Family Education for ongoing care _8  - 0 Staff obtains CProgrammer, systems Records, T Results / Process Orders est _9  - 0 Staff telephones HHA, Nursing Homes / Clarify orders / etc _10  - 0 Routine Transfer to another Facility (non-emergent condition) _11  - 0 Routine Hospital Admission (non-emergent condition) _12  - 0 New Admissions / IBiomedical engineer/ Ordering NPWT Apligraf, etc. , _13  - 0 Emergency Hospital Admission (emergent condition) PROCESS - Special Needs _14  - 0 Pediatric / Minor Patient Management _15  - 0 Isolation Patient Management _16  - 0 Hearing / Language / Visual special needs _17  - 0 Assessment of Community assistance (transportation, D/C planning, etc.) _18  - 0 Additional assistance / Altered mentation _19  - 0 Support Surface(s) Assessment (bed, cushion, seat, etc.) INTERVENTIONS - Miscellaneous _20  - 0 External ear exam _21  - 0 Patient Transfer (multiple staff / HCivil Service fast streamer/ Similar devices) _22  - 0 Simple Staple / Suture removal (25 or less) _23  - 0 Complex Staple / Suture removal (26 or more) _24  - 0 Hypo/Hyperglycemic Management (do not check if  billed separately) _25  - 0 Ankle / Brachial Index (ABI) - do not check if billed separately Has the patient been seen at the hospital within  the last three years: Yes Total Score: 0 Level Of Care: ____ Electronic Signature(s) Signed: 10/28/2022 5:06:55 PM By: Massie Kluver Entered By: Massie Kluver on 10/27/2022 15:04:40 -------------------------------------------------------------------------------- Compression Therapy Details Patient Name: Date of Service: [name] 10/27/2022 2:15 PM Medical Record Number: [id number] Patient Account Number: [id number] Date of Birth/Sex: Treating RN: 02-02-1941 (81 y.o. [name] Primary Care Analee Montee: [name] Other Clinician: Massie Kluver Referring Araya Roel: Treating Sanda Dejoy/Extender: Laural Roes in Treatment: [address] [place] ([id number]) [id number] Page 3 of 10 Compression Therapy Performed for Wound Assessment: Wound #1 Right,Medial Lower Leg Performed By: Lenice Pressman, [name], Compression Type: Three Layer Pre Treatment ABI: 0.9 Post Procedure Diagnosis Same as Pre-procedure Electronic Signature(s) Signed: 10/28/2022 5:06:55 PM By: Massie Kluver Entered By: Massie Kluver on 10/27/2022 15:04:12 -------------------------------------------------------------------------------- Encounter Discharge Information Details Patient Name: Date of Service: [name] 10/27/2022 2:15 PM Medical Record Number: [id number] Patient Account Number: [id number] Date of Birth/Sex: Treating RN: Sep 12, 1941 (81 y.o. [name] Primary Care Cherrise Occhipinti: [name] Other Clinician: Massie Kluver Referring Granvil Djordjevic: Treating Lynia Landry/Extender: Laural Roes in Treatment: 9 Encounter Discharge Information Items Discharge Condition: Stable Ambulatory Status: Ambulatory Discharge Destination: Home Transportation: Private Auto Accompanied By:  daughter Schedule Follow-up Appointment: Yes Clinical Summary of Care: Electronic Signature(s) Signed: 10/28/2022 5:06:55 PM By: Massie Kluver Entered By: Massie Kluver on 10/27/2022 16:56:42 -------------------------------------------------------------------------------- Lower Extremity Assessment Details Patient Name: Date of Service: YARITZY, HUSER 10/27/2022 2:15 PM Medical Record Number: [id number] Patient Account Number: [id number] Date of Birth/Sex: Treating RN: 14-Nov-1941 (81 y.o. [name] Primary Care Izzie Geers: [name] Other Clinician: Massie Kluver Referring Shawndell Varas: Treating Kyaira Trantham/Extender: Laural Roes in Treatment: 9 Edema Assessment E[Left: Lutricia Horsfall ([id number])] [Right: [id number] Page 4 of 10] Assessed: [Left: No] [Right: Yes] Edema: [Left: Ye] [Right: s] Calf Left: Right: Point of Measurement: 30 cm From Medial Instep 34 cm Ankle Left: Right: Point of Measurement: 10 cm From Medial Instep 21.5 cm Vascular Assessment Pulses: Dorsalis Pedis Palpable: [Right:Yes] Electronic Signature(s) Signed: 10/27/2022 5:08:09 PM By: Rosalio Loud MSN RN CNS WTA Signed: 10/28/2022 5:06:55 PM By: Massie Kluver Entered By: Massie Kluver on 10/27/2022 14:45:47 -------------------------------------------------------------------------------- Multi Wound Chart Details Patient Name: Date of Service: [name] 10/27/2022 2:15 PM Medical Record Number: [id number] Patient Account Number: [id number] Date of Birth/Sex: Treating RN: [birth year] (81 y.o. 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 Primary Etiology: Extremity Hypertension, Type II Diabetes, N/A N/A Comorbid History: Osteoarthritis, Neuropathy, Received Chemotherapy, Received Radiation 06/27/2022 N/A N/A Date Acquired: 9 N/A N/A Weeks of Treatment: Open N/A N/A Wound Status: No N/A N/A Wound Recurrence: IRISHA, GRANDMAISON B ([id number]) [id number] Page 5 of 10 2.1x0.3x2.2 N/A N/A Measurements L x W x D (cm) 0.495 N/A N/A A (cm) : rea 1.089 N/A N/A Volume (cm) : 92.50% N/A N/A % Reduction in A rea: 89.70% N/A N/A % Reduction in Volume: 12 Position 1 (o'clock): 0.3 Maximum Distance 1 (cm): 3 Position 2 (o'clock): 2.2 Maximum Distance 2 (cm): Yes N/A N/A Tunneling: Grade 2 N/A N/A Classification: Medium N/A N/A Exudate A mount: Serosanguineous N/A N/A Exudate  Type: red, brown N/A N/A Exudate Color: Thickened N/A N/A Wound Margin: Small (1-33%) N/A N/A Granulation A mount: Red N/A N/A Granulation Quality: Large (67-100%) N/A N/A Necrotic A mount: Eschar, Adherent Slough N/A N/A Necrotic Tissue: Fat Layer (Subcutaneous Tissue): Yes N/A N/A Exposed Structures: Fascia: No Tendon: No Muscle: No Joint: No Bone: No None N/A N/A Epithelialization: Treatment Notes Electronic Signature(s) Signed: 10/28/2022 5:06:55 PM By: Massie Kluver Entered By: Massie Kluver on 10/27/2022 14:46:04 -------------------------------------------------------------------------------- Multi-Disciplinary Care Plan Details Patient Name: Date of Service: [name] 10/27/2022 2:15 PM Medical Record Number: [id number] Patient Account Number: [id number] Date of Birth/Sex: Treating RN: [birth year] (81 y.o. [name] Primary Care Eneida Evers: [name] Other Clinician: Massie Kluver Referring  Kema Santaella: Treating Gustabo Gordillo/Extender: Laural Roes in Treatment: 9 Active Inactive Necrotic Tissue Nursing Diagnoses: Impaired tissue integrity related to necrotic/devitalized tissue Knowledge deficit related to management of necrotic/devitalized tissue Goals: Necrotic/devitalized tissue will be minimized in the wound bed Date Initiated: 08/25/2022 Target Resolution Date: 08/25/2022 Goal Status: Active Patient/caregiver will verbalize understanding of reason and process for debridement of necrotic tissue Date Initiated: 08/25/2022 Target Resolution Date: 08/25/2022 Goal Status: Active Interventions: Assess patient pain level pre-, during and post procedure and prior to discharge Provide education on necrotic tissue and debridement process MENDE, BISWELL B ([id number]) [id number] Page 6 of 10 Treatment Activities: Excisional debridement : 08/25/2022 Notes: Orientation to the Wound Care Program Nursing Diagnoses: Knowledge deficit related to the wound healing center program Goals: Patient/caregiver will verbalize understanding of the [place] Date Initiated: 08/25/2022 Target Resolution Date: 08/25/2022 Goal Status: Active Interventions: Provide education on orientation to the wound center Notes: Pain, Acute or Chronic Nursing Diagnoses: Pain Management - Cyclic Acute (Dressing Change Related) Pain, acute or chronic: actual or potential Potential alteration in comfort, pain Goals: Patient will verbalize adequate pain control and receive pain control interventions during procedures as needed Date Initiated: 08/25/2022 Target Resolution Date: 08/25/2022 Goal Status: Active Patient/caregiver will verbalize adequate pain control between visits Date Initiated: 08/25/2022 Target Resolution Date: 08/25/2022 Goal Status: Active Patient/caregiver will verbalize comfort level met Date Initiated: 08/25/2022 Target  Resolution Date: 08/25/2022 Goal Status: Active Interventions: Provide education on pain management Reposition patient for comfort Treatment Activities: Administer pain control measures as ordered : 08/25/2022 Notes: Peripheral Neuropathy Nursing Diagnoses: Knowledge deficit related to disease process and management of peripheral neurovascular dysfunction Potential alteration in peripheral tissue perfusion (select prior to confirmation of diagnosis) Goals: Patient/caregiver will verbalize understanding of disease process and disease management Date Initiated: 08/25/2022 Target Resolution Date: 08/25/2022 Goal Status: Active Interventions: Assess signs and symptoms of neuropathy upon admission and as needed Provide education on Management of Neuropathy and Related Ulcers Notes: Wound/Skin Impairment Nursing Diagnoses: Impaired tissue integrity Knowledge deficit related to smoking impact on wound healing Knowledge deficit related to ulceration/compromised skin integrity Goals: Patient/caregiver will verbalize understanding of skin care regimen Date Initiated: 08/25/2022 Target Resolution Date: 08/25/2022 Goal Status: Active Ulcer/skin breakdown will have a volume reduction of 30% by week 4 Date Initiated: 08/25/2022 Target Resolution Date: 09/22/2022 Goal Status: Active CORRISA, GIBBY ([id number]) [id number] Page 7 of 10 Interventions: Assess ulceration(s) every visit Provide education on smoking Notes: Electronic Signature(s) Signed: 10/27/2022 5:08:09 PM By: Rosalio Loud MSN RN CNS WTA Signed: 10/28/2022 5:06:55 PM By: Massie Kluver Entered By: Massie Kluver on 10/27/2022 14:45:56 -------------------------------------------------------------------------------- Pain Assessment Details Patient Name: Date of Service: [name] 10/27/2022 2:15 PM Medical Record Number: [id number] Patient Account Number: [id number]  Date of Birth/Sex:  Treating RN: 09/15/1941 (81 y.o. [name] Primary Care Dante Cooter: [name] Other Clinician: Massie Kluver Referring Baldo Hufnagle: Treating Tracer Gutridge/Extender: Laural Roes in Treatment: 9 Active Problems Location of Pain Severity and Description of Pain Patient Has Paino No Site Locations Pain Management and Medication Current Pain Management: Electronic Signature(s) Signed: 10/27/2022 5:08:09 PM By: Rosalio Loud MSN RN CNS WTA Signed: 10/28/2022 5:06:55 PM By: Massie Kluver Entered By: Massie Kluver on 10/27/2022 14:43:50 Stoneman, Verlin B ([id number]) [id number] Page 8 of 10 -------------------------------------------------------------------------------- Patient/Caregiver Education Details Patient Name: Date of Service: JENIYA, FLANNIGAN 11/15/2023andnbsp2:15 PM Medical Record Number: [id number] Patient Account Number: [id number] Date of Birth/Gender: Treating RN: 1941-05-05 (81 y.o. [name] Primary Care Physician: [name] Other Clinician: Massie Kluver Referring Physician: Treating Physician/Extender: Laural Roes in Treatment: 9 Education Assessment Education Provided To: Patient Education Topics Provided Wound/Skin Impairment: Handouts: Other: continue wound care as directed Methods: Explain/Verbal Responses: State content correctly Electronic Signature(s) Signed: 10/28/2022 5:06:55 PM By: Massie Kluver Entered By: Massie Kluver on 10/27/2022 15:05:20 -------------------------------------------------------------------------------- Wound Assessment Details Patient Name: Date of Service: KECHIA, YAHNKE 10/27/2022 2:15 PM Medical Record Number: [id number] Patient Account Number: [id number] Date of Birth/Sex: Treating RN: [birth year] (81 y.o. [name] Primary Care Azelyn Batie: [name] Other Clinician: Massie Kluver Referring Kaveri Perras: Treating  Laquisha Northcraft/Extender: Laural Roes in Treatment: 9 Wound Status Wound Number: 1 Primary Diabetic Wound/Ulcer of the Lower Extremity Etiology: Wound Location: Right, Medial Lower Leg Wound Open Wounding Event: Trauma Status: Date Acquired: 06/27/2022 Comorbid Hypertension, Type II Diabetes, Osteoarthritis, Neuropathy, Weeks Of Treatment: 9 History: Received Chemotherapy, Received Radiation Clustered Wound: No Photos ROWYNN, MCWEENEY B ([id number]) [id number] Page 9 of 10 Wound Measurements Length: (cm) 2.1 Width: (cm) 0.3 Depth: (cm) 2.2 Area: (cm) 0.495 Volume: (cm) 1.089 % Reduction in Area: 92.5% % Reduction in Volume: 89.7% Epithelialization: None Tunneling: Yes Location 1 Position (o'clock): 12 Maximum Distance: (cm) 0.3 Location 2 Position (o'clock): 3 Maximum Distance: (cm) 2.2 Wound Description Classification: Grade 2 Wound Margin: Thickened Exudate Amount: Medium Exudate Type: Serosanguineous Exudate Color: red, brown Foul Odor After Cleansing: No Slough/Fibrino Yes Wound Bed Granulation Amount: Small (1-33%) Exposed Structure Granulation Quality: Red Fascia Exposed: No Necrotic Amount: Large (67-100%) Fat Layer (Subcutaneous Tissue) Exposed: Yes Necrotic Quality: Eschar, Adherent Slough Tendon Exposed: No Muscle Exposed: No Joint Exposed: No Bone Exposed: No Treatment Notes Wound #1 (Lower Leg) Wound Laterality: Right, Medial Cleanser Dakin 16 (oz) 0.25 Discharge Instruction: Use as directed. Peri-Wound Care Topical Gentamicin Discharge Instruction: Apply very small amount to hydrofera rope and place into tunnels Mupirocin Ointment Discharge Instruction: Apply very small amount to hydrofera rope and place into tunnels Primary Dressing AquacelAg Advantage Dressing, 2X2 (in/in) Discharge Instruction: Apply to wound as directed Secondary Dressing Zetuvit Plus 4x8 (in/in) Secured With Compression  Wrap 3-LAYER WRAP - Profore Lite LF 3 Multilayer Compression Bandaging System Discharge Instruction: Apply 3 multi-layer wrap as prescribed. Compression Stockings Add-Ons JESSA, STINSON ([id number]) [id number] Page 10 of 10 Electronic Signature(s) Signed: 10/27/2022 5:08:09 PM By: Rosalio Loud MSN RN CNS WTA Signed: 10/28/2022 5:06:55 PM By: Massie Kluver Entered By: Massie Kluver on 10/27/2022 14:44:47 -------------------------------------------------------------------------------- Vitals Details Patient Name: Date of Service: [name] 10/27/2022 2:15 PM Medical Record Number: [id number] Patient Account Number: [id number] Date of Birth/Sex: Treating RN: [birth year] (81 y.o. [name] Primary Care Aylah Yeary: [name] Other Clinician: Massie Kluver Referring Margaretann Abate: Treating Isidra Mings/Extender: Kalman Shan  Tera Partridge in Treatment: 9 Vital Signs Time Taken: 14:33 Temperature (F): 97.6 Weight (lbs): 172 Pulse (bpm): 86 Respiratory Rate (breaths/min): 18 Blood Pressure (mmHg): 183/94 Reference Range: 80 - 120 mg / dl Electronic Signature(s) Signed: 10/28/2022 5:06:55 PM By: Massie Kluver Entered By: Massie Kluver on 10/27/2022 14:34:25

## 2022-11-03 ENCOUNTER — Encounter (HOSPITAL_BASED_OUTPATIENT_CLINIC_OR_DEPARTMENT_OTHER): Payer: Medicare PPO | Admitting: Internal Medicine

## 2022-11-03 DIAGNOSIS — E11622 Type 2 diabetes mellitus with other skin ulcer: Secondary | ICD-10-CM

## 2022-11-03 DIAGNOSIS — T798XXA Other early complications of trauma, initial encounter: Secondary | ICD-10-CM | POA: Diagnosis not present

## 2022-11-03 DIAGNOSIS — L97818 Non-pressure chronic ulcer of other part of right lower leg with other specified severity: Secondary | ICD-10-CM

## 2022-11-03 DIAGNOSIS — T792XXA Traumatic secondary and recurrent hemorrhage and seroma, initial encounter: Secondary | ICD-10-CM | POA: Diagnosis not present

## 2022-11-03 DIAGNOSIS — I87311 Chronic venous hypertension (idiopathic) with ulcer of right lower extremity: Secondary | ICD-10-CM | POA: Diagnosis not present

## 2022-11-04 NOTE — Progress Notes (Signed)
ONEY, [name] ([id number]) [id number] Page 1 of 8 Visit Report for 11/03/2022 Arrival Information Details Patient Name: Date of Service: [name], AWWAD 11/03/2022 3:00 PM Medical Record Number: [id number] Patient Account Number: [id number] Date of Birth/Sex: Treating RN: 01-04-1941 (81 y.o. [name] Primary [name] Durk Carmen: [name] Other Clinician: Massie Kluver Referring Barton Want: Treating Jeanene Mena/Extender: Laural Roes in Treatment: 10 Visit Information History Since Last Visit Added or deleted any medications: No Patient Arrived: Ambulatory Had a fall or experienced change in No Arrival Time: 15:18 activities of daily living that may affect Accompanied By: daughter risk of falls: Transfer Assistance: None Has Dressing in Place as Prescribed: Yes Patient Identification Verified: Yes Has Compression in Place as Prescribed: Yes Secondary Verification Process Completed: Yes Pain Present Now: No Patient Requires Transmission-Based Precautions: No Patient Has Alerts: Yes Patient Alerts: Patient on Blood Thinner 8m aspirin Type II Diabetic Electronic Signature(s) Signed: 11/03/2022 5:38:08 PM By: WGretta Cool BSN, RN, CWS, Kim RN, BSN Entered By: WGretta Cool BSN, RN, CWS, Kim on 11/03/2022 15:19:21 -------------------------------------------------------------------------------- Encounter Discharge Information Details Patient Name: Date of Service: [name] PunaB. 11/03/2022 3:00 PM Medical Record Number: [id number] Account Number: [id number] of Birth/Sex: Treating RN: 608-22-1942(81 y.o. FMarlowe ShoresPrimary [name] Darryl Blumenstein: ZMia CreekOther Clinician: VMassie KluverReferring Haydyn Girvan: Treating Warren Lindahl/Extender: HLaural Roesin Treatment: 10 Encounter Discharge Information Items Discharge Condition: Stable Ambulatory Status: Ambulatory Discharge Destination:  Home Transportation: Private Auto Accompanied By: self Schedule Follow-up Appointment: Yes Clinical Summary of [name]: ERAYNETTA, OSTERLOH([id number] Page 2 of 8 Electronic Signature(s) Signed: 11/03/2022 5:38:08 PM By: WGretta Cool BSN, RN, CWS, Kim RN, BSN Entered By: WGretta Cool BSN, RN, CWS, Kim on 11/03/2022 16:02:32 -------------------------------------------------------------------------------- Lower Extremity Assessment Details Patient Name: Date of Service: [name] PunaB. 11/03/2022 3:00 PM Medical Record Number: [id number] Account Number: [id number] of Birth/Sex: Treating RN: 623-Jun-1942(81y.o. FMarlowe ShoresPrimary [name] Anayansi Rundquist: ZMia CreekOther Clinician: VMassie KluverReferring Eliabeth Shoff: Treating Rielle Schlauch/Extender: HLaural Roesin Treatment: 10 Edema Assessment Assessed: [[name] No] [Patrice Paradise No] [Left: Edema] [Right: :] Calf Left: Right: Point of Measurement: 30 cm From Medial Instep 35 cm Ankle Left: Right: Point of Measurement: 10 cm From Medial Instep 22 cm Vascular Assessment Pulses: Dorsalis Pedis Palpable: [Right:Yes] Posterior Tibial Palpable: [Right:Yes] Electronic Signature(s) Signed: 11/03/2022 5:38:08 PM By: WGretta Cool BSN, RN, CWS, Kim RN, BSN Entered By: WGretta Cool BSN, RN, CWS, Kim on 11/03/2022 15:31:34 -------------------------------------------------------------------------------- Multi Wound Chart Details Patient Name: Date of Service: [name] PunaB. 11/03/2022 3:00 PM Medical Record Number: [id number] Account Number: [id number] of Birth/Sex: Treating RN: 6Oct 04, 1942(81 y.o. FMarlowe ShoresPrimary [name] Griselda Bramblett: ZMia CreekOther Clinician: VMassie KluverReferring Owen Pratte: Treating Tarvis Blossom/Extender: HLaural Roesin Treatment: 1[place]([id number] Page 3 of 8 Vital  Signs Height(in): Pulse(bpm): 80 Weight(lbs): 172 Blood Pressure(mmHg): 148/62 Body Mass Index(BMI): Temperature(F): Respiratory Rate(breaths/min): 16 [1:Photos:] [N/A:N/A] Right, Medial Lower Leg N/A N/A Wound Location: Trauma N/A N/A Wounding Event: Diabetic Wound/Ulcer of the Lower N/A N/A Primary Etiology: Extremity Hypertension, Type II Diabetes, N/A N/A Comorbid History: Osteoarthritis, Neuropathy, Received Chemotherapy, Received Radiation 06/27/2022 N/A N/A Date Acquired: 10 N/A N/A Weeks of Treatment: Open N/A N/A Wound Status: No N/A N/A Wound Recurrence: Yes N/A N/A Clustered Wound: 2.2x0.5x3 N/A N/A Measurements L x W x D (cm) 0.864 N/A N/A A (cm) : rea 2.592 N/A N/A Volume (cm) :  86.90% N/A N/A % Reduction in A rea: 75.40% N/A N/A % Reduction in Volume: Grade 2 N/A N/A Classification: Medium N/A N/A Exudate A mount: Serosanguineous N/A N/A Exudate Type: red, brown N/A N/A Exudate Color: Thickened N/A N/A Wound Margin: Small (1-33%) N/A N/A Granulation A mount: Red N/A N/A Granulation Quality: Large (67-100%) N/A N/A Necrotic A mount: Eschar, Adherent Slough N/A N/A Necrotic Tissue: Fat Layer (Subcutaneous Tissue): Yes N/A N/A Exposed Structures: Fascia: No Tendon: No Muscle: No Joint: No Bone: No None N/A N/A Epithelialization: Treatment Notes Electronic Signature(s) Signed: 11/03/2022 5:38:08 PM By: Gretta Cool, BSN, RN, CWS, Kim RN, BSN Entered By: Gretta Cool, BSN, RN, CWS, Kim on 11/03/2022 15:43:27 -------------------------------------------------------------------------------- Willacy Details Patient Name: Date of Service: [name] Puna B. 11/03/2022 3:00 PM Medical Record Number: [id number] Patient Account Number: [id number] QUINCY, PRISCO B ([id number]) [id number] Page 4 of 8 Date of Birth/Sex: Treating RN: 01-Jul-1941 (81 y.o. [name] Primary [name] Imelda Dandridge: Other  Clinician: Magnus Ivan Edison Simon, Janace Hoard Referring Beda Dula: Treating Jasira Robinson/Extender: Laural Roes in Treatment: 10 Active Inactive Necrotic Tissue Nursing Diagnoses: Impaired tissue integrity related to necrotic/devitalized tissue Knowledge deficit related to management of necrotic/devitalized tissue Goals: Necrotic/devitalized tissue will be minimized in the wound bed Date Initiated: 08/25/2022 Target Resolution Date: 08/25/2022 Goal Status: Active Patient/caregiver will verbalize understanding of reason and process for debridement of necrotic tissue Date Initiated: 08/25/2022 Target Resolution Date: 08/25/2022 Goal Status: Active Interventions: Assess patient pain level pre-, during and post procedure and prior to discharge Provide education on necrotic tissue and debridement process Treatment Activities: Excisional debridement : 08/25/2022 Notes: Pain, Acute or Chronic Nursing Diagnoses: Pain Management - Cyclic Acute (Dressing Change Related) Pain, acute or chronic: actual or potential Potential alteration in comfort, pain Goals: Patient will verbalize adequate pain control and receive pain control interventions during procedures as needed Date Initiated: 08/25/2022 Target Resolution Date: 08/25/2022 Goal Status: Active Patient/caregiver will verbalize adequate pain control between visits Date Initiated: 08/25/2022 Target Resolution Date: 08/25/2022 Goal Status: Active Patient/caregiver will verbalize comfort level met Date Initiated: 08/25/2022 Target Resolution Date: 08/25/2022 Goal Status: Active Interventions: Provide education on pain management Reposition patient for comfort Treatment Activities: Administer pain control measures as ordered : 08/25/2022 Notes: Peripheral Neuropathy Nursing Diagnoses: Knowledge deficit related to disease process and management of peripheral neurovascular dysfunction Potential alteration in peripheral tissue  perfusion (select prior to confirmation of diagnosis) Goals: Patient/caregiver will verbalize understanding of disease process and disease management Date Initiated: 08/25/2022 Target Resolution Date: 08/25/2022 Goal Status: Active Interventions: Assess signs and symptoms of neuropathy upon admission and as needed Provide education on Management of Neuropathy and Related Ulcers Notes: JHANE, LORIO B ([id number]) [id number] Page 5 of 8 Nursing Diagnoses: Impaired tissue integrity Knowledge deficit related to smoking impact on wound healing Knowledge deficit related to ulceration/compromised skin integrity Goals: Patient/caregiver will verbalize understanding of skin [name] regimen Date Initiated: 08/25/2022 Target Resolution Date: 08/25/2022 Goal Status: Active Ulcer/skin breakdown will have a volume reduction of 30% by week 4 Date Initiated: 08/25/2022 Target Resolution Date: 09/22/2022 Goal Status: Active Interventions: Assess ulceration(s) every visit Provide education on smoking Notes: Electronic Signature(s) Signed: 11/03/2022 5:38:08 PM By: Gretta Cool, BSN, RN, CWS, Kim RN, BSN Entered By: Gretta Cool, BSN, RN, CWS, Kim on 11/03/2022 15:40:48 -------------------------------------------------------------------------------- Pain Assessment Details Patient Name: Date of Service: [name] Puna B. 11/03/2022 3:00 PM Medical Record Number: [id number] Patient Account Number: [id number] Date of Birth/Sex: Treating RN: [birth year] (81 y.o. [name] Primary [name]  Sheila Ocasio: [name] Other Clinician: Massie Kluver Referring Fernandez Kenley: Treating Valen Mascaro/Extender: Laural Roes in Treatment: 10 Active Problems Location of Pain Severity and Description of Pain Patient Has Paino No Site Locations Pain Management and Medication Current Pain Management: Electronic Signature(s) CARYNN, FELLING B ([id number])  [id number].pdf Page 6 of 8 Signed: 11/03/2022 5:38:08 PM By: Gretta Cool, BSN, RN, CWS, Kim RN, BSN Entered By: Gretta Cool, BSN, RN, CWS, Kim on 11/03/2022 15:20:02 -------------------------------------------------------------------------------- Patient/Caregiver Education Details Patient Name: Date of Service: NANA, VASTINE 11/22/2023andnbsp3:00 PM Medical Record Number: [id number] Patient Account Number: [id number] Date of Birth/Gender: Treating RN: Dec 02, 1941 (81 y.o. [name] Primary [name] Physician: [name] Other Clinician: Massie Kluver Referring Physician: Treating Physician/Extender: Laural Roes in Treatment: 10 Education Assessment Education Provided To: Patient Education Topics Provided Offloading: Peripheral Neuropathy: Wound/Skin Impairment: Handouts: Caring for Your Ulcer Methods: Demonstration, Explain/Verbal Responses: State content correctly Electronic Signature(s) Signed: 11/03/2022 5:38:08 PM By: Gretta Cool, BSN, RN, CWS, Kim RN, BSN Entered By: Gretta Cool, BSN, RN, CWS, Kim on 11/03/2022 16:02:04 -------------------------------------------------------------------------------- Wound Assessment Details Patient Name: Date of Service: [name] Puna B. 11/03/2022 3:00 PM Medical Record Number: [id number] Patient Account Number: [id number] Date of Birth/Sex: Treating RN: 1941/05/03 (81 y.o. [name] Primary [name] Rashaan Wyles: [name] Other Clinician: Massie Kluver Referring Eryn Marandola: Treating Aayana Reinertsen/Extender: Laural Roes in Treatment: 10 Wound Status Wound Number: 1 Primary Diabetic Wound/Ulcer of the Lower Extremity Etiology: Wound Location: Right, Medial Lower Leg Wound Open Wounding Event: Trauma Status: Date Acquired: 06/27/2022 Comorbid Hypertension, Type II Diabetes, Osteoarthritis, Neuropathy, Weeks Of Treatment: 10 History: Received Chemotherapy, Received  Radiation Clustered Wound: Yes HOPIE, PELLEGRIN B ([id number]) [id number] Page 7 of 8 Photos Photo Uploaded By: Gretta Cool, BSN, RN, CWS, Kim on 11/03/2022 15:35:19 Wound Measurements Length: (cm) 2.2 Width: (cm) 0.5 Depth: (cm) 3 Area: (cm) 0.864 Volume: (cm) 2.592 % Reduction in Area: 86.9% % Reduction in Volume: 75.4% Epithelialization: None Wound Description Classification: Grade 2 Wound Margin: Thickened Exudate Amount: Medium Exudate Type: Serosanguineous Exudate Color: red, brown Foul Odor After Cleansing: No Slough/Fibrino Yes Wound Bed Granulation Amount: Small (1-33%) Exposed Structure Granulation Quality: Red Fascia Exposed: No Necrotic Amount: Large (67-100%) Fat Layer (Subcutaneous Tissue) Exposed: Yes Necrotic Quality: Eschar, Adherent Slough Tendon Exposed: No Muscle Exposed: No Joint Exposed: No Bone Exposed: No Treatment Notes Wound #1 (Lower Leg) Wound Laterality: Right, Medial Cleanser Dakin 16 (oz) 0.25 Discharge Instruction: Use as directed. Peri-Wound [name] Topical Gentamicin Discharge Instruction: Apply very small amount to hydrofera rope and place into tunnels Mupirocin Ointment Discharge Instruction: Apply very small amount to hydrofera rope and place into tunnels Primary Dressing AquacelAg Advantage Dressing, 2X2 (in/in) Discharge Instruction: Apply to wound as directed Secondary Dressing Zetuvit Plus 4x8 (in/in) Secured With Compression Wrap 3-LAYER WRAP - Profore Lite LF 3 Multilayer Compression Bandaging System Discharge Instruction: Apply 3 multi-layer wrap as prescribed. Compression Stockings Add-Ons Electronic Signature(s) Signed: 11/03/2022 5:38:08 PM By: Gretta Cool, BSN, RN, CWS, Kim RN, BSN [place] ([id number]) [id number] Page 8 of 8 Entered By: Gretta Cool, BSN, RN, CWS, Kim on 11/03/2022  15:30:20 -------------------------------------------------------------------------------- Vitals Details Patient Name: Date of Service: JESSICA, CHECKETTS 11/03/2022 3:00 PM Medical Record Number: [id number] Patient Account Number: [id number] Date of Birth/Sex: Treating RN: January 29, 1941 (81 y.o. [name] Primary [name] Britlee Skolnik: [name] Other Clinician: Massie Kluver Referring Sukhman Kocher: Treating Daulton Harbaugh/Extender: Laural Roes in Treatment: 10 Vital Signs Time Taken: 15:19 Pulse (bpm): 80 Weight (lbs): 172 Respiratory  Rate (breaths/min): 16 Blood Pressure (mmHg): 148/62 Reference Range: 80 - 120 mg / dl Electronic Signature(s) Signed: 11/03/2022 5:38:08 PM By: Gretta Cool, BSN, RN, CWS, Kim RN, BSN Entered By: Gretta Cool, BSN, RN, CWS, Kim on 11/03/2022 15:19:56

## 2022-11-04 NOTE — Progress Notes (Signed)
MACHEL, VIOLANTE ([id number]) [id number] Page 1 of 6 Visit Report for 11/03/2022 Chief Complaint Document Details Patient Name: Date of Service: CAMARY, SOSA 11/03/2022 3:00 PM Medical Record Number: [id number] Patient Account Number: [id number] Date of Birth/Sex: Treating RN: 1941-03-25 (81 y.o. [name] Primary Care Provider: [name] Other Clinician: Massie Kluver Referring Provider: Treating Provider/Extender: Laural Roes in Treatment: 10 Information Obtained from: Patient Chief Complaint 08/25/2022; right lower extremity wounds secondary to traumatic hematoma Electronic Signature(s) Signed: 11/03/2022 4:53:17 PM By: Kalman Shan DO Entered By: Kalman Shan on 11/03/2022 15:54:28 -------------------------------------------------------------------------------- HPI Details Patient Name: Date of Service: [name] 11/03/2022 3:00 PM Medical Record Number: [id number] Patient Account Number: [id number] Date of Birth/Sex: Treating RN: 1941-03-05 (81 y.o. [name] Primary Care Provider: [name] Other Clinician: Massie Kluver Referring Provider: Treating Provider/Extender: Laural Roes in Treatment: 10 History of Present Illness HPI Description: Admission 08/25/2022 Ms. Mystic Labo is an 81 year old female with a past medical history of breast cancer, with insulin dependent, controlled type 2 diabetes, chronic venous insufficiency that presents to the clinic for a 32-monthhistory of nonhealing ulcer to the right lower extremity. She states she hit her leg against a stair and developed a hematoma. The area was evacuated by her primary care physician. She has tried Neosporin and wet-to-dry dressings. She reports mild chronic pain to the area. She denies increased warmth, erythema or purulent drainage. 9/20; patient presents for follow-up. She had an x-ray of the  right leg done at last clinic visit that did not show Evidence of osteomyelitis. She has been doing Dakin's wet-to-dry dressings without issues. She denies signs of infection. 9/27; patient presents for follow-up. Hydrofera Blue with antibiotic ointment was used under Kerlix/Coban at last clinic visit. She tolerated this well. She has no issues or complaints today. She denies signs of infection. 10/4; patient presents for follow-up. We have been using Hydrofera Blue to the wound bed all under Kerlix/Coban. She has no issues or complaints today. She has tolerated this well. 10/11; patient presents for follow-up. We have been using Hydrofera Blue to the wound bed under Kerlix/Coban. She has tolerated this well and has no issues or complaints. ESUKHMANI, FETHEROLF([id number] Page 2 of 6 10/18; patient presents for follow-up. We have been using Hydrofera Blue with Santyl to the wound bed under Kerlix/Coban. She has no issues or complaints today. 10/25; this is a patient with a right lower extremity medial leg ulcer in the setting of fairly severe chronic venous insufficiency. She has been using Santyl Hydrofera Blue rope Hydrofera Blue under kerlix Coban. She presents with 3 tunneling areas in the wound. 11/1; this is a patient with a difficult right lower extremity medial leg ulcer in the setting of fairly clear chronic venous hypertension, hemosiderin deposition. This was initially traumatic in the setting of a fairly clear hematoma looking at cell phone pictures today. We have been using Hydrofera Blue under compression. 11/8; patient presents for follow-up. We have been using Hydrofera Blue blue under compression therapy. She has no issues or complaints today. Snap VAC was recommended at last clinic visit and this was run through insurance. We will ask for clarification on cost. 11/15; patient presents for follow-up. We have been using Hydrofera Blue with  antibiotic ointment under compression therapy. Patient has no issues or complaints today. 11/22; patient presents for follow-up. We have been using silver alginate with antibiotic ointment under compression therapy. Patient denies  signs of infection. Electronic Signature(s) Signed: 11/03/2022 4:53:17 PM By: Kalman Shan DO Entered By: Kalman Shan on 11/03/2022 15:56:04 -------------------------------------------------------------------------------- Physical Exam Details Patient Name: Date of Service: [name] 11/03/2022 3:00 PM Medical Record Number: [id number] Patient Account Number: [id number] Date of Birth/Sex: Treating RN: 08-06-1941 (81 y.o. [name] Primary Care Provider: [name] Other Clinician: Massie Kluver Referring Provider: Treating Provider/Extender: Laural Roes in Treatment: 10 Constitutional . Cardiovascular . Psychiatric . Notes Right lower extremity: T the distal medial aspect there Are 2 open wounds. Granulation tissue at the opening. No surrounding soft tissue infection including o increased warmth, erythema or purulent drainage. Venous stasis dermatitis. Good edema control. Electronic Signature(s) Signed: 11/03/2022 4:53:17 PM By: Kalman Shan DO Entered By: Kalman Shan on 11/03/2022 15:56:43 Marlena Clipper B ([id number]) [id number] Page 3 of 6 -------------------------------------------------------------------------------- Physician Orders Details Patient Name: Date of Service: CLARIZA, SICKMAN 11/03/2022 3:00 PM Medical Record Number: [id number] Patient Account Number: [id number] Date of Birth/Sex: Treating RN: 1941-11-21 (81 y.o. [name] Primary Care Provider: [name] Other Clinician: Massie Kluver Referring Provider: Treating Provider/Extender: Laural Roes in Treatment: 10 Verbal / Phone Orders: No Diagnosis  Coding Follow-up Appointments Wound #1 Right,Medial Lower Leg Return Appointment in 1 week. Nurse Visit as needed Bathing/ Shower/ Hygiene May shower with wound dressing protected with water repellent cover or cast protector. No tub bath. Edema Control - Lymphedema / Segmental Compressive Device / Other 3 Layer Compression System for Lymphedema. - right lower leg Elevate legs to the level of the heart and pump ankles as often as possible Wound Treatment Wound #1 - Lower Leg Wound Laterality: Right, Medial Cleanser: Dakin 16 (oz) 0.25 1 x Per Week/30 Days Discharge Instructions: Use as directed. Topical: Gentamicin 1 x Per Week/30 Days Discharge Instructions: Apply very small amount to hydrofera rope and place into tunnels Topical: Mupirocin Ointment 1 x Per Week/30 Days Discharge Instructions: Apply very small amount to hydrofera rope and place into tunnels Prim Dressing: AquacelAg Advantage Dressing, 2X2 (in/in) 1 x Per Week/30 Days ary Discharge Instructions: Apply to wound as directed Secondary Dressing: Zetuvit Plus 4x8 (in/in) 1 x Per Week/30 Days Compression Wrap: 3-LAYER WRAP - Profore Lite LF 3 Multilayer Compression Bandaging System 1 x Per Week/30 Days Discharge Instructions: Apply 3 multi-layer wrap as prescribed. Electronic Signature(s) Signed: 11/03/2022 4:53:17 PM By: Kalman Shan DO Signed: 11/03/2022 5:38:08 PM By: Gretta Cool, BSN, RN, CWS, Kim RN, BSN Entered By: Gretta Cool, BSN, RN, CWS, Kim on 11/03/2022 16:01:16 -------------------------------------------------------------------------------- Problem List Details Patient Name: Date of Service: [name] 11/03/2022 3:00 PM Medical Record Number: [id number] Patient Account Number: [id number] Date of Birth/Sex: Treating RN: November 11, 1941 (81 y.o. [name] Primary Care Provider: [name] Other Clinician: Massie Kluver Referring Provider: Treating Provider/Extender: Laural Roes  in Treatment: [address] CHIQUETTA, LANGNER B ([id number]) [id number] Page 4 of 6 ICD-10 Encounter Code Description Active Date MDM Diagnosis L97.818 Non-pressure chronic ulcer of other part of right lower leg with other specified 08/25/2022 No Yes severity T79.8XXA Other early complications of trauma, initial encounter 08/25/2022 No Yes T79.2XXA Traumatic secondary and recurrent hemorrhage and seroma, initial encounter 08/25/2022 No Yes E11.622 Type 2 diabetes mellitus with other skin ulcer 08/25/2022 No Yes E03.9 Hypothyroidism, unspecified 08/25/2022 No Yes I10 Essential (primary) hypertension 08/25/2022 No Yes I87.311 Chronic venous hypertension (idiopathic) with ulcer of right lower extremity 08/25/2022 No Yes Inactive Problems Resolved Problems Electronic Signature(s) Signed:  11/03/2022 4:53:17 PM By: Kalman Shan DO Entered By: Kalman Shan on 11/03/2022 15:54:23 -------------------------------------------------------------------------------- Progress Note Details Patient Name: Date of Service: [name] 11/03/2022 3:00 PM Medical Record Number: [id number] Patient Account Number: [id number] Date of Birth/Sex: Treating RN: 11-Oct-1941 (81 y.o. [name] Primary Care Provider: [name] Other Clinician: Massie Kluver Referring Provider: Treating Provider/Extender: Laural Roes in Treatment: 10 Subjective Chief Complaint Information obtained from Patient 08/25/2022; right lower extremity wounds secondary to traumatic hematoma History of Present Illness (HPI) Admission 08/25/2022 Ms. Ricki Vanhandel is an 81 year old female with a past medical history of breast cancer, with insulin dependent, controlled type 2 diabetes, chronic venous insufficiency that presents to the clinic for a 86-monthhistory of nonhealing ulcer to the right lower extremity. She states she hit her leg against a stair and developed a  hematoma. The area was evacuated by her primary care physician. She has tried Neosporin and wet-to-dry dressings. She reports mild chronic ENATALIAH, HATLESTADB ([id number] Page 5 of 6 pain to the area. She denies increased warmth, erythema or purulent drainage. 9/20; patient presents for follow-up. She had an x-ray of the right leg done at last clinic visit that did not show Evidence of osteomyelitis. She has been doing Dakin's wet-to-dry dressings without issues. She denies signs of infection. 9/27; patient presents for follow-up. Hydrofera Blue with antibiotic ointment was used under Kerlix/Coban at last clinic visit. She tolerated this well. She has no issues or complaints today. She denies signs of infection. 10/4; patient presents for follow-up. We have been using Hydrofera Blue to the wound bed all under Kerlix/Coban. She has no issues or complaints today. She has tolerated this well. 10/11; patient presents for follow-up. We have been using Hydrofera Blue to the wound bed under Kerlix/Coban. She has tolerated this well and has no issues or complaints. 10/18; patient presents for follow-up. We have been using Hydrofera Blue with Santyl to the wound bed under Kerlix/Coban. She has no issues or complaints today. 10/25; this is a patient with a right lower extremity medial leg ulcer in the setting of fairly severe chronic venous insufficiency. She has been using Santyl Hydrofera Blue rope Hydrofera Blue under kerlix Coban. She presents with 3 tunneling areas in the wound. 11/1; this is a patient with a difficult right lower extremity medial leg ulcer in the setting of fairly clear chronic venous hypertension, hemosiderin deposition. This was initially traumatic in the setting of a fairly clear hematoma looking at cell phone pictures today. We have been using Hydrofera Blue under compression. 11/8; patient presents for follow-up. We have been using Hydrofera  Blue blue under compression therapy. She has no issues or complaints today. Snap VAC was recommended at last clinic visit and this was run through insurance. We will ask for clarification on cost. 11/15; patient presents for follow-up. We have been using Hydrofera Blue with antibiotic ointment under compression therapy. Patient has no issues or complaints today. 11/22; patient presents for follow-up. We have been using silver alginate with antibiotic ointment under compression therapy. Patient denies signs of infection. Objective Constitutional Vitals Time Taken: 3:19 PM, Weight: 172 lbs, Pulse: 80 bpm, Respiratory Rate: 16 breaths/min, Blood Pressure: 148/62 mmHg. General Notes: Right lower extremity: T the distal medial aspect there Are 2 open wounds. Granulation tissue at the opening. No surrounding soft tissue o infection including increased warmth, erythema or purulent drainage. Venous stasis dermatitis. Good edema control. Integumentary (Hair, Skin) Wound #1 status is Open.  Original cause of wound was Trauma. The date acquired was: 06/27/2022. The wound has been in treatment 10 weeks. The wound is located on the Right,Medial Lower Leg. The wound measures 2.2cm length x 0.5cm width x 3cm depth; 0.864cm^2 area and 2.592cm^3 volume. There is Fat Layer (Subcutaneous Tissue) exposed. There is a medium amount of serosanguineous drainage noted. The wound margin is thickened. There is small (1-33%) red granulation within the wound bed. There is a large (67-100%) amount of necrotic tissue within the wound bed including Eschar and Adherent Slough. Assessment Active Problems ICD-10 Non-pressure chronic ulcer of other part of right lower leg with other specified severity Other early complications of trauma, initial encounter Traumatic secondary and recurrent hemorrhage and seroma, initial encounter Type 2 diabetes mellitus with other skin ulcer Hypothyroidism, unspecified Essential (primary)  hypertension Chronic venous hypertension (idiopathic) with ulcer of right lower extremity Patient's wounds are stable. I recommended continuing the course with silver alginate and antibiotic ointment under compression therapy. Overall wounds appears well-healing. Patient states that since her wound developed she has been less active. She is starting to feel weaker overall. I recommended she follow-up with her primary care physician for physical therapy. She states she will call her PCP office. Plan 1. Silver alginate with antibiotic ointment under 3 layer compressionooright lower extremity 2. Follow-up with primary care physician for Discussion of physical therapy KAELIE, HENIGAN ([id number]) [id number] Page 6 of 6 Electronic Signature(s) Signed: 11/03/2022 4:53:17 PM By: Kalman Shan DO Entered By: Kalman Shan on 11/03/2022 15:58:59 -------------------------------------------------------------------------------- SuperBill Details Patient Name: Date of Service: [name] 11/03/2022 Medical Record Number: [id number] Patient Account Number: [id number] Date of Birth/Sex: Treating RN: 1941/06/13 (82 y.o. [name] Primary Care Provider: [name] Other Clinician: Massie Kluver Referring Provider: Treating Provider/Extender: Laural Roes in Treatment: 10 Diagnosis Coding ICD-10 Codes Code Description [id number] Non-pressure chronic ulcer of other part of right lower leg with other specified severity T79.8XXA Other early complications of trauma, initial encounter T33.2XXA Traumatic secondary and recurrent hemorrhage and seroma, initial encounter E11.622 Type 2 diabetes mellitus with other skin ulcer E03.9 Hypothyroidism, unspecified I10 Essential (primary) hypertension I87.311 Chronic venous hypertension (idiopathic) with ulcer of right lower extremity Facility Procedures : CPT4 Code: [id number] Description:  (Facility Use Only) 48546EV - APPLY MULTLAY COMPRS LWR RT LEG Modifier: Quantity: 1 Physician Procedures : CPT4 Code Description Modifier [id number] - WC PHYS LEVEL 3 - EST PT ICD-10 Diagnosis Description L97.818 Non-pressure chronic ulcer of other part of right lower leg with other specified severity T79.8XXA Other early complications of trauma, initial  encounter E11.622 Type 2 diabetes mellitus with other skin ulcer T79.2XXA Traumatic secondary and recurrent hemorrhage and seroma, initial encounter Quantity: 1 Electronic Signature(s) Signed: 11/03/2022 4:53:17 PM By: Kalman Shan DO Signed: 11/03/2022 5:38:08 PM By: Gretta Cool, BSN, RN, CWS, Kim RN, BSN Entered By: Gretta Cool, BSN, RN, CWS, Kim on 11/03/2022 16:01:30

## 2022-11-10 ENCOUNTER — Encounter (HOSPITAL_BASED_OUTPATIENT_CLINIC_OR_DEPARTMENT_OTHER): Payer: Medicare PPO | Admitting: Internal Medicine

## 2022-11-10 DIAGNOSIS — L97818 Non-pressure chronic ulcer of other part of right lower leg with other specified severity: Secondary | ICD-10-CM

## 2022-11-10 DIAGNOSIS — I87311 Chronic venous hypertension (idiopathic) with ulcer of right lower extremity: Secondary | ICD-10-CM

## 2022-11-10 DIAGNOSIS — E11622 Type 2 diabetes mellitus with other skin ulcer: Secondary | ICD-10-CM

## 2022-11-10 DIAGNOSIS — T798XXA Other early complications of trauma, initial encounter: Secondary | ICD-10-CM | POA: Diagnosis not present

## 2022-11-10 NOTE — Progress Notes (Signed)
MILI, PILTZ ([id number]) [id number] Page 1 of 7 Visit Report for 11/10/2022 Chief Complaint Document Details Patient Name: Date of Service: [name], [name] 11/10/2022 2:15 PM Medical Record Number: [id number] Patient Account Number: [id number] Date of Birth/Sex: Treating RN: Jan 23, 1941 (80 y.o. [name] Primary Care Provider: Mia [name] Other Clinician: Massie [name] Referring Provider: Treating Provider/Extender: Laural [name] in Treatment: 11 Information Obtained from: Patient Chief Complaint 08/25/2022; right lower extremity wounds secondary to traumatic hematoma Electronic Signature(s) Signed: 11/10/2022 4:08:48 PM By: Kalman Shan DO Entered By: Kalman Shan on 11/10/2022 15:13:09 -------------------------------------------------------------------------------- HPI Details Patient Name: Date of Service: [name]. 11/10/2022 2:15 PM Medical Record Number: [id number] Patient Account Number: [id number] Date of Birth/Sex: Treating RN: March 23, 1941 (81 y.o. [name] Primary Care Provider: Mia [name] Other Clinician: Massie [name] Referring Provider: Treating Provider/Extender: Laural [name] in Treatment: 11 History of Present Illness HPI Description: Admission 08/25/2022 [name] is an 81 year old female with a past medical history of breast cancer, with insulin dependent, controlled type 2 diabetes, chronic venous insufficiency that presents to the clinic for a 45-monthhistory of nonhealing ulcer to the right lower extremity. She states she hit her leg against a stair and developed a hematoma. The area was evacuated by her primary care physician. She has tried Neosporin and wet-to-dry dressings. She reports mild chronic pain to the area. She denies increased warmth, erythema or purulent drainage. 9/20; patient presents for follow-up. She had an x-ray of  the right leg done at last clinic visit that did not show Evidence of osteomyelitis. She has been doing Dakin's wet-to-dry dressings without issues. She denies signs of infection. 9/27; patient presents for follow-up. Hydrofera Blue with antibiotic ointment was used under Kerlix/Coban at last clinic visit. She tolerated this well. She has no issues or complaints today. She denies signs of infection. 10/4; patient presents for follow-up. We have been using Hydrofera Blue to the wound bed all under Kerlix/Coban. She has no issues or complaints today. She has tolerated this well. 10/11; patient presents for follow-up. We have been using Hydrofera Blue to the wound bed under Kerlix/Coban. She has tolerated this well and has no issues or complaints. [name]([id number] Page 2 of 7 10/18; patient presents for follow-up. We have been using Hydrofera Blue with Santyl to the wound bed under Kerlix/Coban. She has no issues or complaints today. 10/25; this is a patient with a right lower extremity medial leg ulcer in the setting of fairly severe chronic venous insufficiency. She has been using Santyl Hydrofera Blue rope Hydrofera Blue under kerlix Coban. She presents with 3 tunneling areas in the wound. 11/1; this is a patient with a difficult right lower extremity medial leg ulcer in the setting of fairly clear chronic venous hypertension, hemosiderin deposition. This was initially traumatic in the setting of a fairly clear hematoma looking at cell phone pictures today. We have been using Hydrofera Blue under compression. 11/8; patient presents for follow-up. We have been using Hydrofera Blue blue under compression therapy. She has no issues or complaints today. Snap VAC was recommended at last clinic visit and this was run through insurance. We will ask for clarification on cost. 11/15; patient presents for follow-up. We have been using Hydrofera Blue with  antibiotic ointment under compression therapy. Patient has no issues or complaints today. 11/22; patient presents for follow-up. We have been using silver alginate with antibiotic ointment under compression therapy. Patient denies  signs of infection. 11/29; patient presents for follow-up. We have been using silver alginate with antibiotic ointment under compression therapy. One of the tunnels has healed up. She has no issues or complaints today. Electronic Signature(s) Signed: 11/10/2022 4:08:48 PM By: Kalman Shan DO Entered By: Kalman Shan on 11/10/2022 15:14:30 -------------------------------------------------------------------------------- Physical Exam Details Patient Name: Date of Service: [name] 11/10/2022 2:15 PM Medical Record Number: [id number] Patient Account Number: [id number] Date of Birth/Sex: Treating RN: [birth year] (81 y.o. [name] Primary Care Provider: Mia [name] Other Clinician: Massie [name] Referring Provider: Treating Provider/Extender: Laural [name] in Treatment: 11 Constitutional . Cardiovascular . Psychiatric . Notes Right lower extremity: T the distal medial aspect there is 1 open wound that is narrow with some depth. Previous wound proximal to this has epithelization. No o signs of infection. Good edema control. Venous stasis dermatitis. Electronic Signature(s) Signed: 11/10/2022 4:08:48 PM By: Kalman Shan DO Entered By: Kalman Shan on 11/10/2022 15:15:19 [name] ([id number]) [id number] Page 3 of 7 -------------------------------------------------------------------------------- Physician Orders Details Patient Name: Date of Service: [name], [name] 11/10/2022 2:15 PM Medical Record Number: [id number] Patient Account Number: [id number] Date of Birth/Sex: Treating RN: 1941-06-26 (81 y.o. [name] Primary Care Provider: Mia [name] Other  Clinician: Massie [name] Referring Provider: Treating Provider/Extender: Laural [name] in Treatment: 11 Verbal / Phone Orders: No Diagnosis Coding Follow-up Appointments Wound #1 Right,Medial Lower Leg Return Appointment in 1 week. Nurse Visit as needed Bathing/ Shower/ Hygiene May shower with wound dressing protected with water repellent cover or cast protector. No tub bath. Edema Control - Lymphedema / Segmental Compressive Device / Other 3 Layer Compression System for Lymphedema. - right lower leg Elevate legs to the level of the heart and pump ankles as often as possible Medications-Please add to medication list. ntibiotic - Gentamycin and Mupirocin Topical A Wound Treatment Wound #1 - Lower Leg Wound Laterality: Right, Medial Cleanser: Dakin 16 (oz) 0.25 1 x Per Week/30 Days Discharge Instructions: Use as directed. Topical: Gentamicin 1 x Per Week/30 Days Discharge Instructions: Apply very small amount to hydrofera rope and place into tunnels Topical: Mupirocin Ointment 1 x Per Week/30 Days Discharge Instructions: Apply very small amount to hydrofera rope and place into tunnels Prim Dressing: AquacelAg Advantage Dressing, 2X2 (in/in) 1 x Per Week/30 Days ary Discharge Instructions: Apply to wound as directed Secondary Dressing: Zetuvit Plus 4x8 (in/in) 1 x Per Week/30 Days Compression Wrap: 3-LAYER WRAP - Profore Lite LF 3 Multilayer Compression Bandaging System 1 x Per Week/30 Days Discharge Instructions: Apply 3 multi-layer wrap as prescribed. Electronic Signature(s) Signed: 11/10/2022 4:08:48 PM By: Kalman Shan DO Entered By: Kalman Shan on 11/10/2022 15:17:51 [name] ([id number]) [id number] Page 4 of 7 -------------------------------------------------------------------------------- Problem List Details Patient Name: Date of Service: [name], [name] 11/10/2022 2:15 PM Medical Record Number:  [id number] Patient Account Number: [id number] Date of Birth/Sex: Treating RN: Jan 04, 1941 (81 y.o. [name] Primary Care Provider: Mia [name] Other Clinician: Massie [name] Referring Provider: Treating Provider/Extender: Laural [name] in Treatment: 11 Active Problems ICD-10 Encounter Code Description Active Date MDM Diagnosis L97.818 Non-pressure chronic ulcer of other part of right lower leg with other specified 08/25/2022 No Yes severity T79.8XXA Other early complications of trauma, initial encounter 08/25/2022 No Yes T79.2XXA Traumatic secondary and recurrent hemorrhage and seroma, initial encounter 08/25/2022 No Yes E11.622 Type 2 diabetes mellitus with other skin ulcer 08/25/2022 No Yes E03.9 Hypothyroidism, unspecified 08/25/2022 No Yes  I10 Essential (primary) hypertension 08/25/2022 No Yes I87.311 Chronic venous hypertension (idiopathic) with ulcer of right lower extremity 08/25/2022 No Yes Inactive Problems Resolved Problems Electronic Signature(s) Signed: 11/10/2022 4:08:48 PM By: Kalman Shan DO Entered By: Kalman Shan on 11/10/2022 15:13:04 [name] ([id number]) [id number] Page 5 of 7 -------------------------------------------------------------------------------- Progress Note Details Patient Name: Date of Service: [name], [name] 11/10/2022 2:15 PM Medical Record Number: [id number] Patient Account Number: [id number] Date of Birth/Sex: Treating RN: 1941/06/09 (81 y.o. [name] Primary Care Provider: Mia [name] Other Clinician: Massie [name] Referring Provider: Treating Provider/Extender: Laural [name] in Treatment: 11 Subjective Chief Complaint Information obtained from Patient 08/25/2022; right lower extremity wounds secondary to traumatic hematoma History of Present Illness (HPI) Admission 08/25/2022 Ms. Ilayda Toda is an 81 year old female with a past  medical history of breast cancer, with insulin dependent, controlled type 2 diabetes, chronic venous insufficiency that presents to the clinic for a 81-monthhistory of nonhealing ulcer to the right lower extremity. She states she hit her leg against a stair and developed a hematoma. The area was evacuated by her primary care physician. She has tried Neosporin and wet-to-dry dressings. She reports mild chronic pain to the area. She denies increased warmth, erythema or purulent drainage. 9/20; patient presents for follow-up. She had an x-ray of the right leg done at last clinic visit that did not show Evidence of osteomyelitis. She has been doing Dakin's wet-to-dry dressings without issues. She denies signs of infection. 9/27; patient presents for follow-up. Hydrofera Blue with antibiotic ointment was used under Kerlix/Coban at last clinic visit. She tolerated this well. She has no issues or complaints today. She denies signs of infection. 10/4; patient presents for follow-up. We have been using Hydrofera Blue to the wound bed all under Kerlix/Coban. She has no issues or complaints today. She has tolerated this well. 10/11; patient presents for follow-up. We have been using Hydrofera Blue to the wound bed under Kerlix/Coban. She has tolerated this well and has no issues or complaints. 10/18; patient presents for follow-up. We have been using Hydrofera Blue with Santyl to the wound bed under Kerlix/Coban. She has no issues or complaints today. 10/25; this is a patient with a right lower extremity medial leg ulcer in the setting of fairly severe chronic venous insufficiency. She has been using Santyl Hydrofera Blue rope Hydrofera Blue under kerlix Coban. She presents with 3 tunneling areas in the wound. 11/1; this is a patient with a difficult right lower extremity medial leg ulcer in the setting of fairly clear chronic venous hypertension, hemosiderin deposition. This was initially traumatic in the  setting of a fairly clear hematoma looking at cell phone pictures today. We have been using Hydrofera Blue under compression. 11/8; patient presents for follow-up. We have been using Hydrofera Blue blue under compression therapy. She has no issues or complaints today. Snap VAC was recommended at last clinic visit and this was run through insurance. We will ask for clarification on cost. 11/15; patient presents for follow-up. We have been using Hydrofera Blue with antibiotic ointment under compression therapy. Patient has no issues or complaints today. 11/22; patient presents for follow-up. We have been using silver alginate with antibiotic ointment under compression therapy. Patient denies signs of infection. 11/29; patient presents for follow-up. We have been using silver alginate with antibiotic ointment under compression therapy. One of the tunnels has healed up. She has no issues or complaints today. Objective Constitutional Vitals Time Taken: 2:42 PM, Weight: 172  lbs, Temperature: 98.2 F, Pulse: 81 bpm, Respiratory Rate: 18 breaths/min, Blood Pressure: 166/78 mmHg. General Notes: Right lower extremity: T the distal medial aspect there is 1 open wound that is narrow with some depth. Previous wound proximal to this has o epithelization. No signs of infection. Good edema control. Venous stasis dermatitis. [name], [name] ([id number]) [id number] Page 6 of 7 Integumentary (Hair, Skin) Wound #1 status is Open. Original cause of wound was Trauma. The date acquired was: 06/27/2022. The wound has been in treatment 11 weeks. The wound is located on the Right,Medial Lower Leg. The wound measures 0.3cm length x 0.2cm width x 1.6cm depth; 0.047cm^2 area and 0.075cm^3 volume. There is Fat Layer (Subcutaneous Tissue) exposed. There is no undermining noted, however, there is tunneling at 3:00 with a maximum distance of 1.6cm. There is a medium amount of serosanguineous  drainage noted. The wound margin is thickened. There is small (1-33%) red granulation within the wound bed. There is a large (67-100%) amount of necrotic tissue within the wound bed including Eschar and Adherent Slough. 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 She has 1 small narrowed wound still present. I recommended continuing the course with antibiotic ointment to address any bioburden and continue with Aquacel Ag under compression therapy. Follow-up in 1 week. Procedures Wound #1 Pre-procedure diagnosis of Wound #1 is a Diabetic Wound/Ulcer of the Lower Extremity located on the Right,Medial Lower Leg . There was a Three Layer Compression Therapy Procedure with a pre-treatment ABI of 0.9 by [name]. Post procedure Diagnosis Wound #1: Same as Pre-Procedure Plan Follow-up Appointments: Wound #1 Right,Medial Lower Leg: Return Appointment in 1 week. Nurse Visit as needed Bathing/ Shower/ Hygiene: May shower with wound dressing protected with water repellent cover or cast protector. No tub bath. Edema Control - Lymphedema / Segmental Compressive Device / Other: 3 Layer Compression System for Lymphedema. - right lower leg Elevate legs to the level of the heart and pump ankles as often as possible Medications-Please add to medication list.: Topical Antibiotic - Gentamycin and Mupirocin WOUND #1: - Lower Leg Wound Laterality: Right, Medial Cleanser: Dakin 16 (oz) 0.25 1 x Per Week/30 Days Discharge Instructions: Use as directed. Topical: Gentamicin 1  x Per Week/30 Days Discharge Instructions: Apply very small amount to hydrofera rope and place into tunnels Topical: Mupirocin Ointment 1 x Per Week/30 Days Discharge Instructions: Apply very small amount to hydrofera rope and place into tunnels Prim Dressing: AquacelAg Advantage Dressing, 2X2 (in/in) 1 x Per Week/30 Days ary Discharge Instructions: Apply to wound as directed Secondary Dressing: Zetuvit Plus 4x8 (in/in) 1 x Per Week/30 Days Com pression Wrap: 3-LAYER WRAP - Profore Lite LF 3 Multilayer Compression Bandaging System 1 x Per Week/30 Days Discharge Instructions: Apply 3 multi-layer wrap as prescribed. 1. Aquacel Ag with antibiotic ointment under 3 layer compression 2. Follow-up in 1 week Electronic Signature(s) Signed: 11/10/2022 4:08:48 PM By: Kalman Shan DO Entered By: Kalman Shan on 11/10/2022 15:16:36 [name] ([id number]) [id number] Page 7 of 7 -------------------------------------------------------------------------------- SuperBill Details Patient Name: Date of Service: [name], [name] 11/10/2022 Medical Record Number: [id number] Patient Account Number: [id number] Date of Birth/Sex: Treating RN: 06/26/1941 (81 y.o. F)  Carlene Coria Primary Care Provider: Mia [name] Other Clinician: Massie [name] Referring Provider: Treating Provider/Extender: Laural [name] in Treatment: 11 Diagnosis Coding ICD-10 Codes Code Description [phone] Non-pressure chronic ulcer of other part of right lower leg with other specified severity T79.8XXA Other early complications of trauma, initial encounter T65.2XXA Traumatic secondary and recurrent hemorrhage and seroma, initial encounter E11.622 Type 2 diabetes mellitus with other skin ulcer E03.9 Hypothyroidism, unspecified I10 Essential (primary) hypertension I87.311 Chronic venous hypertension (idiopathic) with ulcer of right lower extremity Facility  Procedures : CPT4 Code: [id number] Description: (Facility Use Only) [phone] - APPLY MULTLAY COMPRS LWR RT LEG ICD-10 Diagnosis Description L97.818 Non-pressure chronic ulcer of other part of right lower leg with other specified sever Modifier: ity Quantity: 1 Physician Procedures : CPT4 Code Description Modifier [id number] - WC PHYS LEVEL 3 - EST PT ICD-10 Diagnosis Description L97.818 Non-pressure chronic ulcer of other part of right lower leg with other specified severity E11.622 Type 2 diabetes mellitus with other skin  ulcer I87.311 Chronic venous hypertension (idiopathic) with ulcer of right lower extremity T79.8XXA Other early complications of trauma, initial encounter Quantity: 1 Electronic Signature(s) Signed: 11/10/2022 4:08:48 PM By: Kalman Shan DO Entered By: Kalman Shan on 11/10/2022 15:17:41

## 2022-11-11 NOTE — Progress Notes (Signed)
[name], [name] ([id number]) [id number] Page 1 of 10 Visit Report for 11/10/2022 Arrival Information Details Patient Name: Date of Service: [name], [name] 11/10/2022 2:15 PM Medical Record Number: [id number] Patient Account Number: [id number] Date of Birth/Sex: Treating RN: 23-Jul-1941 (81 y.o. Orvan Falconer Primary Care Provider: [name] Other Clinician: Massie Kluver Referring Provider: Treating Provider/Extender: Laural Roes in Treatment: 11 Visit Information History Since Last Visit All ordered tests and consults were completed: No Patient Arrived: Ambulatory Added or deleted any medications: No Arrival Time: 14:29 Any new allergies or adverse reactions: No Transfer Assistance: None Had a fall or experienced change in No Patient Identification Verified: Yes activities of daily living that may affect Secondary Verification Process Completed: Yes risk of falls: Patient Requires Transmission-Based Precautions: No Signs or symptoms of abuse/neglect since last visito No Patient Has Alerts: Yes Hospitalized since last visit: No Patient Alerts: Patient on Blood Thinner Implantable device outside of the clinic excluding No 90m aspirin cellular tissue based products placed in the center Type II Diabetic since last visit: Has Dressing in Place as Prescribed: Yes Has Compression in Place as Prescribed: Yes Pain Present Now: No Electronic Signature(s) Signed: 11/11/2022 4:33:44 PM By: VMassie KluverEntered By: VMassie Kluveron 11/10/2022 14:41:59 -------------------------------------------------------------------------------- Clinic Level of Care Assessment Details Patient Name: Date of Service: [name], [name] 2:15 PM Medical Record Number: [id number] Account Number: [id number] of Birth/Sex: Treating RN: [birth year](81y.o. FOrvan FalconerPrimary Care Provider: ZMia CreekOther  Clinician: VMassie KluverReferring Provider: Treating Provider/Extender: HLaural Roesin Treatment: 11 Clinic Level of Care Assessment Items TOOL 1 Quantity Score [] - 0 Use when EandM and Procedure is performed on INITIAL visit ASSESSMENTS - Nursing Assessment / Reassessment [] - 0 General Physical Exam (combine w/ comprehensive assessment (listed just below) when performed on new pt. evals)KAYDIN, LABO([id number] Page 2 of 10 [] - 0 Comprehensive Assessment (HX, ROS, Risk Assessments, Wounds Hx, etc.) ASSESSMENTS - Wound and Skin Assessment / Reassessment [] - 0 Dermatologic / Skin Assessment (not related to wound area) ASSESSMENTS - Ostomy and/or Continence Assessment and Care [] - 0 Incontinence Assessment and Management [] - 0 Ostomy Care Assessment and Management (repouching, etc.) PROCESS - Coordination of Care [] - 0 Simple Patient / Family Education for ongoing care [] - 0 Complex (extensive) Patient / Family Education for ongoing care [] - 0 Staff obtains CProgrammer, systems Records, T Results / Process Orders est [] - 0 Staff telephones HHA, Nursing Homes / Clarify orders / etc [] - 0 Routine Transfer to another Facility (non-emergent condition) [] - 0 Routine Hospital Admission (non-emergent condition) [] - 0 New Admissions / IBiomedical engineer/ Ordering NPWT Apligraf, etc. , [] - 0 Emergency Hospital Admission (emergent condition) PROCESS - Special Needs [] - 0 Pediatric / Minor Patient Management [] - 0 Isolation Patient Management [] - 0 Hearing / Language / Visual special needs [] - 0 Assessment of Community assistance (transportation, D/C planning, etc.) [] - 0 Additional assistance / Altered mentation [] - 0 Support Surface(s) Assessment (bed, cushion, seat, etc.) INTERVENTIONS - Miscellaneous [] - 0 External ear exam [] - 0 Patient Transfer (multiple staff / HCivil Service fast streamer/  Similar devices) [] - 0 Simple Staple / Suture removal (25 or less) [] - 0 Complex Staple / Suture removal (26 or more) [] - 0 Hypo/Hyperglycemic Management (do not check if billed separately) [] -  0 Ankle / Brachial Index (ABI) - do not check if billed separately Has the patient been seen at the hospital within the last three years: Yes Total Score: 0 Level Of Care: ____ Electronic Signature(s) Signed: 11/11/2022 4:33:44 PM By: Massie Kluver Entered By: Massie Kluver on 11/10/2022 15:07:10 -------------------------------------------------------------------------------- Compression Therapy Details Patient Name: Date of Service: [name] 11/10/2022 2:15 PM Medical Record Number: [id number] Patient Account Number: [id number] Date of Birth/Sex: Treating RN: [birth year] (81 y.o. Orvan Falconer Primary Care Provider: [name] Other Clinician: Emiya, Loomer [name] ([id number]) [phone].pdf Page 3 of 10 Referring Provider: Treating Provider/Extender: Laural Roes in Treatment: 11 Compression Therapy Performed for Wound Assessment: Wound #1 Right,Medial Lower Leg Performed By: Lenice Pressman, [name], Compression Type: Three Layer Pre Treatment ABI: 0.9 Post Procedure Diagnosis Same as Pre-procedure Electronic Signature(s) Signed: 11/11/2022 4:33:44 PM By: Massie Kluver Entered By: Massie Kluver on 11/10/2022 15:06:50 -------------------------------------------------------------------------------- Encounter Discharge Information Details Patient Name: Date of Service: [name] 11/10/2022 2:15 PM Medical Record Number: [id number] Patient Account Number: [id number] Date of Birth/Sex: Treating RN: 10/16/1941 (81 y.o. Orvan Falconer Primary Care Provider: [name] Other Clinician: Massie Kluver Referring Provider: Treating Provider/Extender: Laural Roes in  Treatment: 11 Encounter Discharge Information Items Discharge Condition: Stable Ambulatory Status: Cane Discharge Destination: Home Transportation: Private Auto Accompanied By: daughter Schedule Follow-up Appointment: Yes Clinical Summary of Care: Electronic Signature(s) Signed: 11/11/2022 4:33:44 PM By: Massie Kluver Entered By: Massie Kluver on 11/10/2022 16:45:45 -------------------------------------------------------------------------------- Lower Extremity Assessment Details Patient Name: Date of Service: [name], [name] 11/10/2022 2:15 PM Medical Record Number: [id number] Patient Account Number: [id number] Date of Birth/Sex: Treating RN: 10-22-1941 (81 y.o. Orvan Falconer Primary Care Provider: [name] Other Clinician: Massie Kluver Referring Provider: Treating Provider/Extender: Laural Roes in Treatment: [address], [place] [name] ([id number]) [id number] Page 4 of 10 Edema Assessment Assessed: [Left: No] [Right: Yes] Edema: [Left: Ye] [Right: s] Calf Left: Right: Point of Measurement: 30 cm From Medial Instep 33.8 cm Ankle Left: Right: Point of Measurement: 10 cm From Medial Instep 20.6 cm Vascular Assessment Pulses: Dorsalis Pedis Palpable: [Right:Yes] Electronic Signature(s) Signed: 11/11/2022 4:33:44 PM By: Massie Kluver Signed: 11/11/2022 4:40:57 PM By: Carlene Coria RN Entered By: Massie Kluver on 11/10/2022 14:55:30 -------------------------------------------------------------------------------- Multi Wound Chart Details Patient Name: Date of Service: [name] 11/10/2022 2:15 PM Medical Record Number: [id number] Patient Account Number: [id number] Date of Birth/Sex: Treating RN: 09/26/1941 (81 y.o. Orvan Falconer Primary Care Provider: [name] Other Clinician: Massie Kluver Referring Provider: Treating Provider/Extender: Laural Roes in Treatment:  11 Vital Signs Height(in): Pulse(bpm): 81 Weight(lbs): 172 Blood Pressure(mmHg): 166/78 Body Mass Index(BMI): Temperature(F): 98.2 Respiratory Rate(breaths/min): 18 [1:Photos:] [N/A:N/A] Right, Medial Lower Leg N/A N/A Wound Location: Trauma N/A N/A Wounding Event: Diabetic Wound/Ulcer of the Lower N/A N/A Primary Etiology: Extremity Hypertension, Type II Diabetes, N/A N/A Comorbid History: Osteoarthritis, Neuropathy, Received Chemotherapy, Received Radiation 06/27/2022 N/A N/A Date AcquiredNISSI, DOFFING ([id number]) [id number] Page 5 of 10 11 N/A N/A Weeks of Treatment: Open N/A N/A Wound Status: No N/A N/A Wound Recurrence: Yes N/A N/A Clustered Wound: 0.3x0.2x1.6 N/A N/A Measurements L x W x D (cm) 0.047 N/A N/A A (cm) : rea 0.075 N/A N/A Volume (cm) : 99.30% N/A N/A % Reduction in A rea: 99.30% N/A N/A % Reduction in Volume: 3 Position 1 (o'clock): 1.6 Maximum Distance 1 (cm): Yes N/A N/A Tunneling: Grade 2  N/A N/A Classification: Medium N/A N/A Exudate A mount: Serosanguineous N/A N/A Exudate Type: red, brown N/A N/A Exudate Color: Thickened N/A N/A Wound Margin: Small (1-33%) N/A N/A Granulation A mount: Red N/A N/A Granulation Quality: Large (67-100%) N/A N/A Necrotic A mount: Eschar, Adherent Slough N/A N/A Necrotic Tissue: Fat Layer (Subcutaneous Tissue): Yes N/A N/A Exposed Structures: Fascia: No Tendon: No Muscle: No Joint: No Bone: No None N/A N/A Epithelialization: Treatment Notes Electronic Signature(s) Signed: 11/11/2022 4:33:44 PM By: Massie Kluver Entered By: Massie Kluver on 11/10/2022 14:55:55 -------------------------------------------------------------------------------- Multi-Disciplinary Care Plan Details Patient Name: Date of Service: [name] 11/10/2022 2:15 PM Medical Record Number: [id number] Patient Account Number: [id number] Date of Birth/Sex: Treating  RN: 1941-01-10 (81 y.o. Orvan Falconer Primary Care Shatia Sindoni: [name] Other Clinician: Massie Kluver Referring Crystina Borrayo: Treating Matea Stanard/Extender: Laural Roes in Treatment: 11 Active Inactive Necrotic Tissue Nursing Diagnoses: Impaired tissue integrity related to necrotic/devitalized tissue Knowledge deficit related to management of necrotic/devitalized tissue Goals: Necrotic/devitalized tissue will be minimized in the wound bed Date Initiated: 08/25/2022 Target Resolution Date: 08/25/2022 Goal Status: Active Patient/caregiver will verbalize understanding of reason and process for debridement of necrotic tissue Date Initiated: 08/25/2022 Target Resolution Date: 08/25/2022 Goal Status: Active Interventions: Assess patient pain level pre-, during and post procedure and prior to discharge [name], [name] [name] ([id number]) [id number] Page 6 of 10 Provide education on necrotic tissue and debridement process Treatment Activities: Excisional debridement : 08/25/2022 Notes: Pain, Acute or Chronic Nursing Diagnoses: Pain Management - Cyclic Acute (Dressing Change Related) Pain, acute or chronic: actual or potential Potential alteration in comfort, pain Goals: Patient will verbalize adequate pain control and receive pain control interventions during procedures as needed Date Initiated: 08/25/2022 Target Resolution Date: 08/25/2022 Goal Status: Active Patient/caregiver will verbalize adequate pain control between visits Date Initiated: 08/25/2022 Target Resolution Date: 08/25/2022 Goal Status: Active Patient/caregiver will verbalize comfort level met Date Initiated: 08/25/2022 Target Resolution Date: 08/25/2022 Goal Status: Active Interventions: Provide education on pain management Reposition patient for comfort Treatment Activities: Administer pain control measures as ordered : 08/25/2022 Notes: Peripheral Neuropathy Nursing  Diagnoses: Knowledge deficit related to disease process and management of peripheral neurovascular dysfunction Potential alteration in peripheral tissue perfusion (select prior to confirmation of diagnosis) Goals: Patient/caregiver will verbalize understanding of disease process and disease management Date Initiated: 08/25/2022 Target Resolution Date: 08/25/2022 Goal Status: Active Interventions: Assess signs and symptoms of neuropathy upon admission and as needed Provide education on Management of Neuropathy and Related Ulcers Notes: Wound/Skin Impairment Nursing Diagnoses: Impaired tissue integrity Knowledge deficit related to smoking impact on wound healing Knowledge deficit related to ulceration/compromised skin integrity Goals: Patient/caregiver will verbalize understanding of skin care regimen Date Initiated: 08/25/2022 Target Resolution Date: 08/25/2022 Goal Status: Active Ulcer/skin breakdown will have a volume reduction of 30% by week 4 Date Initiated: 08/25/2022 Target Resolution Date: 09/22/2022 Goal Status: Active Interventions: Assess ulceration(s) every visit Provide education on smoking Notes: Electronic Signature(s) Signed: 11/11/2022 4:33:44 PM By: Massie Kluver Signed: 11/11/2022 4:40:57 PM By: Carlene Coria RN Entered By: Massie Kluver on 11/10/2022 14:55:35 [name], [name] ([id number]) [id number] Page 7 of 10 -------------------------------------------------------------------------------- Pain Assessment Details Patient Name: Date of Service: [name], [name] 11/10/2022 2:15 PM Medical Record Number: [id number] Patient Account Number: [id number] Date of Birth/Sex: Treating RN: 03/24/1941 (81 y.o. Orvan Falconer Primary Care Imran Nuon: [name] Other Clinician: Massie Kluver Referring Anaelle Dunton: Treating Cabrini Ruggieri/Extender: Laural Roes in Treatment: 11 Active Problems Location of Pain Severity  and  Description of Pain Patient Has Paino No Site Locations Pain Management and Medication Current Pain Management: Electronic Signature(s) Signed: 11/11/2022 4:33:44 PM By: Massie Kluver Signed: 11/11/2022 4:40:57 PM By: Carlene Coria RN Entered By: Massie Kluver on 11/10/2022 14:44:55 -------------------------------------------------------------------------------- Patient/Caregiver Education Details Patient Name: Date of Service: [name] 11/29/2023andnbsp2:15 PM Medical Record Number: [id number] Patient Account Number: [id number] Date of Birth/Gender: Treating RN: 1941/06/04 (81 y.o. Orvan Falconer Primary Care Physician: [name] Other Clinician: Massie Kluver Referring Physician: Treating Physician/Extender: [name], Schiller, Coralee Pesa ([id number]) [phone].pdf Page 8 of 10 Weeks in Treatment: 11 Education Assessment Education Provided To: Patient Education Topics Provided Wound/Skin Impairment: Handouts: Other: continue wound care as directed Methods: Explain/Verbal Responses: State content correctly Electronic Signature(s) Signed: 11/11/2022 4:33:44 PM By: Massie Kluver Entered By: Massie Kluver on 11/10/2022 16:45:11 -------------------------------------------------------------------------------- Wound Assessment Details Patient Name: Date of Service: [name] 11/10/2022 2:15 PM Medical Record Number: [id number] Patient Account Number: [id number] Date of Birth/Sex: Treating RN: 09/30/1941 (81 y.o. Orvan Falconer Primary Care Provider: [name] Other Clinician: Massie Kluver Referring Provider: Treating Provider/Extender: Laural Roes in Treatment: 11 Wound Status Wound Number: 1 Primary Diabetic Wound/Ulcer of the Lower Extremity Etiology: Wound Location: Right, Medial Lower Leg Wound Open Wounding Event: Trauma Status: Date Acquired: 06/27/2022 Comorbid  Hypertension, Type II Diabetes, Osteoarthritis, Neuropathy, Weeks Of Treatment: 11 History: Received Chemotherapy, Received Radiation Clustered Wound: Yes Photos Wound Measurements Length: (cm) 0.3 Width: (cm) 0.2 Depth: (cm) 1.6 Area: (cm) 0.047 Volume: (cm) 0.075 % Reduction in Area: 99.3% % Reduction in Volume: 99.3% Epithelialization: None Tunneling: Yes Position (o'clock): 3 Maximum Distance: (cm) 1.6 Undermining: No MONROE, TOURE [name] ([id number]) [id number] Page 9 of 10 Wound Description Classification: Grade 2 Wound Margin: Thickened Exudate Amount: Medium Exudate Type: Serosanguineous Exudate Color: red, brown Foul Odor After Cleansing: No Slough/Fibrino Yes Wound Bed Granulation Amount: Small (1-33%) Exposed Structure Granulation Quality: Red Fascia Exposed: No Necrotic Amount: Large (67-100%) Fat Layer (Subcutaneous Tissue) Exposed: Yes Necrotic Quality: Eschar, Adherent Slough Tendon Exposed: No Muscle Exposed: No Joint Exposed: No Bone Exposed: No Treatment Notes Wound #1 (Lower Leg) Wound Laterality: Right, Medial Cleanser Dakin 16 (oz) 0.25 Discharge Instruction: Use as directed. Peri-Wound Care Topical Gentamicin Discharge Instruction: Apply very small amount to hydrofera rope and place into tunnels Mupirocin Ointment Discharge Instruction: Apply very small amount to hydrofera rope and place into tunnels Primary Dressing AquacelAg Advantage Dressing, 2X2 (in/in) Discharge Instruction: Apply to wound as directed Secondary Dressing Zetuvit Plus 4x8 (in/in) Secured With Compression Wrap 3-LAYER WRAP - Profore Lite LF 3 Multilayer Compression Bandaging System Discharge Instruction: Apply 3 multi-layer wrap as prescribed. Compression Stockings Add-Ons Electronic Signature(s) Signed: 11/11/2022 4:33:44 PM By: Massie Kluver Signed: 11/11/2022 4:40:57 PM By: Carlene Coria RN Entered By: Massie Kluver on 11/10/2022  14:54:30 -------------------------------------------------------------------------------- Vitals Details Patient Name: Date of Service: Bonner Puna [name]. 11/10/2022 2:15 PM Medical Record Number: [id number] Patient Account Number: [id number] Date of Birth/Sex: Treating RN: 11/09/1941 (81 y.o. Orvan Falconer Primary Care Provider: [name] Other Clinician: Massie Kluver Referring Provider: Treating Provider/Extender: Laural Roes in Treatment: [address] [place] [name] ([id number]) [id number] Page 10 of 10 Vital Signs Time Taken: 14:42 Temperature (F): 98.2 Weight (lbs): 172 Pulse (bpm): 81 Respiratory Rate (breaths/min): 18 Blood Pressure (mmHg): 166/78 Reference Range: 80 - 120 mg / dl Electronic Signature(s) Signed: 11/11/2022 4:33:44 PM By: Massie Kluver Entered By: Massie Kluver on 11/10/2022 14:44:51

## 2022-11-17 ENCOUNTER — Encounter: Payer: Medicare PPO | Attending: Internal Medicine | Admitting: Internal Medicine

## 2022-11-17 DIAGNOSIS — Z794 Long term (current) use of insulin: Secondary | ICD-10-CM | POA: Diagnosis not present

## 2022-11-17 DIAGNOSIS — I87311 Chronic venous hypertension (idiopathic) with ulcer of right lower extremity: Secondary | ICD-10-CM | POA: Diagnosis not present

## 2022-11-17 DIAGNOSIS — Z853 Personal history of malignant neoplasm of breast: Secondary | ICD-10-CM | POA: Diagnosis not present

## 2022-11-17 DIAGNOSIS — T798XXA Other early complications of trauma, initial encounter: Secondary | ICD-10-CM | POA: Diagnosis not present

## 2022-11-17 DIAGNOSIS — T792XXA Traumatic secondary and recurrent hemorrhage and seroma, initial encounter: Secondary | ICD-10-CM | POA: Insufficient documentation

## 2022-11-17 DIAGNOSIS — E039 Hypothyroidism, unspecified: Secondary | ICD-10-CM | POA: Diagnosis not present

## 2022-11-17 DIAGNOSIS — G8929 Other chronic pain: Secondary | ICD-10-CM | POA: Diagnosis not present

## 2022-11-17 DIAGNOSIS — Z9221 Personal history of antineoplastic chemotherapy: Secondary | ICD-10-CM | POA: Diagnosis not present

## 2022-11-17 DIAGNOSIS — Z923 Personal history of irradiation: Secondary | ICD-10-CM | POA: Diagnosis not present

## 2022-11-17 DIAGNOSIS — I10 Essential (primary) hypertension: Secondary | ICD-10-CM | POA: Insufficient documentation

## 2022-11-17 DIAGNOSIS — M1990 Unspecified osteoarthritis, unspecified site: Secondary | ICD-10-CM | POA: Diagnosis not present

## 2022-11-17 DIAGNOSIS — E1140 Type 2 diabetes mellitus with diabetic neuropathy, unspecified: Secondary | ICD-10-CM | POA: Diagnosis not present

## 2022-11-17 DIAGNOSIS — L97818 Non-pressure chronic ulcer of other part of right lower leg with other specified severity: Secondary | ICD-10-CM | POA: Insufficient documentation

## 2022-11-17 DIAGNOSIS — E11622 Type 2 diabetes mellitus with other skin ulcer: Secondary | ICD-10-CM | POA: Insufficient documentation

## 2022-11-17 DIAGNOSIS — X58XXXA Exposure to other specified factors, initial encounter: Secondary | ICD-10-CM | POA: Diagnosis not present

## 2022-11-18 NOTE — Progress Notes (Signed)
LACOSTA, HARGAN ([id number]) [phone].pdf Page 1 of 7 Visit Report for 11/17/2022 Chief Complaint Document Details Patient Name: Date of Service: [name], [name] 11/17/2022 2:15 PM Medical Record Number: [id number] Patient Account Number: [id number] Date of Birth/Sex: Treating RN: August 17, 1941 (81 y.o. [name] Primary Care Provider: [name] Other Clinician: Massie Kluver Referring Provider: Treating Provider/Extender: Laural Roes in Treatment: 12 Information Obtained from: Patient Chief Complaint 08/25/2022; right lower extremity wounds secondary to traumatic hematoma Electronic Signature(s) Signed: 11/17/2022 3:52:35 PM By: Kalman Shan DO Entered By: Kalman Shan on 11/17/2022 15:25:19 -------------------------------------------------------------------------------- HPI Details Patient Name: Date of Service: [name] 11/17/2022 2:15 PM Medical Record Number: [id number] Patient Account Number: [id number] Date of Birth/Sex: Treating RN: [birth year] (80 y.o. [name] Primary Care Provider: [name] Other Clinician: Massie Kluver Referring Provider: Treating Provider/Extender: Laural Roes in Treatment: 12 History of Present Illness HPI Description: Admission 08/25/2022 Ms. Lilybeth Vien is an 81 year old female with a past medical history of breast cancer, with insulin dependent, controlled type 2 diabetes, chronic venous insufficiency that presents to the clinic for a 72-monthhistory of nonhealing ulcer to the right lower extremity. She states she hit her leg against a stair and developed a hematoma. The area was evacuated by her primary care physician. She has tried Neosporin and wet-to-dry dressings. She reports mild chronic pain to the area. She denies increased warmth, erythema or purulent drainage. 9/20; patient presents for follow-up. She had an x-ray of the  right leg done at last clinic visit that did not show Evidence of osteomyelitis. She has been doing Dakin's wet-to-dry dressings without issues. She denies signs of infection. 9/27; patient presents for follow-up. Hydrofera Blue with antibiotic ointment was used under Kerlix/Coban at last clinic visit. She tolerated this well. She has no issues or complaints today. She denies signs of infection. 10/4; patient presents for follow-up. We have been using Hydrofera Blue to the wound bed all under Kerlix/Coban. She has no issues or complaints today. She has tolerated this well. 10/11; patient presents for follow-up. We have been using Hydrofera Blue to the wound bed under Kerlix/Coban. She has tolerated this well and has no issues or complaints. [name], [name]([id number] [phone] Page 2 of 7 10/18; patient presents for follow-up. We have been using Hydrofera Blue with Santyl to the wound bed under Kerlix/Coban. She has no issues or complaints today. 10/25; this is a patient with a right lower extremity medial leg ulcer in the setting of fairly severe chronic venous insufficiency. She has been using Santyl Hydrofera Blue rope Hydrofera Blue under kerlix Coban. She presents with 3 tunneling areas in the wound. 11/1; this is a patient with a difficult right lower extremity medial leg ulcer in the setting of fairly clear chronic venous hypertension, hemosiderin deposition. This was initially traumatic in the setting of a fairly clear hematoma looking at cell phone pictures today. We have been using Hydrofera Blue under compression. 11/8; patient presents for follow-up. We have been using Hydrofera Blue blue under compression therapy. She has no issues or complaints today. Snap VAC was recommended at last clinic visit and this was run through insurance. We will ask for clarification on cost. 11/15; patient presents for follow-up. We have been using Hydrofera Blue with  antibiotic ointment under compression therapy. Patient has no issues or complaints today. 11/22; patient presents for follow-up. We have been using silver alginate with antibiotic ointment under compression therapy. Patient denies  signs of infection. 11/29; patient presents for follow-up. We have been using silver alginate with antibiotic ointment under compression therapy. One of the tunnels has healed up. She has no issues or complaints today. 12/6; patient presents for follow-up. We have been using silver alginate with antibiotic ointment under compression therapy. She has no issues or complaints today. Electronic Signature(s) Signed: 11/17/2022 3:52:35 PM By: Kalman Shan DO Entered By: Kalman Shan on 11/17/2022 15:25:40 -------------------------------------------------------------------------------- Physical Exam Details Patient Name: Date of Service: [name] 11/17/2022 2:15 PM Medical Record Number: [id number] Patient Account Number: [id number] Date of Birth/Sex: Treating RN: [birth year] (81 y.o. [name] Primary Care Provider: [name] Other Clinician: Massie Kluver Referring Provider: Treating Provider/Extender: Laural Roes in Treatment: 12 Constitutional . Cardiovascular . Psychiatric . Notes Right lower extremity: T the distal medial aspect there is 1 open wound that is narrow with some depth. No signs of infection. Good edema control. Venous o stasis dermatitis. Electronic Signature(s) Signed: 11/17/2022 3:52:35 PM By: Kalman Shan DO Entered By: Kalman Shan on 11/17/2022 15:26:30 [name] ([id number]) [id number] Page 3 of 7 -------------------------------------------------------------------------------- Physician Orders Details Patient Name: Date of Service: [name], [name] 11/17/2022 2:15 PM Medical Record Number: [id number] Patient Account Number: [id number] Date  of Birth/Sex: Treating RN: 07-03-1941 (81 y.o. [name] Primary Care Provider: [name] Other Clinician: Massie Kluver Referring Provider: Treating Provider/Extender: Laural Roes in Treatment: 12 Verbal / Phone Orders: No Diagnosis Coding Follow-up Appointments Wound #1 Right,Medial Lower Leg Return Appointment in 1 week. Nurse Visit as needed Bathing/ Shower/ Hygiene May shower with wound dressing protected with water repellent cover or cast protector. No tub bath. Edema Control - Lymphedema / Segmental Compressive Device / Other 3 Layer Compression System for Lymphedema. - right lower leg Elevate legs to the level of the heart and pump ankles as often as possible Medications-Please add to medication list. ntibiotic - Gentamycin and Mupirocin Topical A Wound Treatment Wound #1 - Lower Leg Wound Laterality: Right, Medial Cleanser: Dakin 16 (oz) 0.25 1 x Per Week/30 Days Discharge Instructions: Use as directed. Topical: Gentamicin 1 x Per Week/30 Days Discharge Instructions: Apply very small amount to hydrofera rope and place into tunnels Topical: Mupirocin Ointment 1 x Per Week/30 Days Discharge Instructions: Apply very small amount to hydrofera rope and place into tunnels Prim Dressing: AquacelAg Advantage Dressing, 2X2 (in/in) 1 x Per Week/30 Days ary Discharge Instructions: Apply to wound as directed Secondary Dressing: Zetuvit Plus 4x8 (in/in) 1 x Per Week/30 Days Compression Wrap: 3-LAYER WRAP - Profore Lite LF 3 Multilayer Compression Bandaging System 1 x Per Week/30 Days Discharge Instructions: Apply 3 multi-layer wrap as prescribed. Electronic Signature(s) Signed: 11/17/2022 3:52:35 PM By: Kalman Shan DO Entered By: Kalman Shan on 11/17/2022 15:29:55 [name] ([id number]) [id number] Page 4 of 7 -------------------------------------------------------------------------------- Problem List  Details Patient Name: Date of Service: [name], [name] 11/17/2022 2:15 PM Medical Record Number: [id number] Patient Account Number: [id number] Date of Birth/Sex: Treating RN: [birth year] (81 y.o. [name] Primary Care Provider: [name] Other Clinician: Massie Kluver Referring Provider: Treating Provider/Extender: Laural Roes in Treatment: 12 Active Problems ICD-10 Encounter Code Description Active Date MDM Diagnosis L97.818 Non-pressure chronic ulcer of other part of right lower leg with other specified 08/25/2022 No Yes severity T79.8XXA Other early complications of trauma, initial encounter 08/25/2022 No Yes T79.2XXA Traumatic secondary and recurrent hemorrhage and seroma, initial encounter 08/25/2022 No Yes E11.622  Type 2 diabetes mellitus with other skin ulcer 08/25/2022 No Yes E03.9 Hypothyroidism, unspecified 08/25/2022 No Yes I10 Essential (primary) hypertension 08/25/2022 No Yes I87.311 Chronic venous hypertension (idiopathic) with ulcer of right lower extremity 08/25/2022 No Yes Inactive Problems Resolved Problems Electronic Signature(s) Signed: 11/17/2022 3:52:35 PM By: Kalman Shan DO Entered By: Kalman Shan on 11/17/2022 15:25:15 [name] ([id number]) [id number] Page 5 of 7 -------------------------------------------------------------------------------- Progress Note Details Patient Name: Date of Service: [name], [name] 11/17/2022 2:15 PM Medical Record Number: [id number] Patient Account Number: [id number] Date of Birth/Sex: Treating RN: December 04, 1941 (81 y.o. [name] Primary Care Provider: [name] Other Clinician: Massie Kluver Referring Provider: Treating Provider/Extender: Laural Roes in Treatment: 12 Subjective Chief Complaint Information obtained from Patient 08/25/2022; right lower extremity wounds secondary to traumatic hematoma History of  Present Illness (HPI) Admission 08/25/2022 Ms. Sabella Traore is an 81 year old female with a past medical history of breast cancer, with insulin dependent, controlled type 2 diabetes, chronic venous insufficiency that presents to the clinic for a 43-monthhistory of nonhealing ulcer to the right lower extremity. She states she hit her leg against a stair and developed a hematoma. The area was evacuated by her primary care physician. She has tried Neosporin and wet-to-dry dressings. She reports mild chronic pain to the area. She denies increased warmth, erythema or purulent drainage. 9/20; patient presents for follow-up. She had an x-ray of the right leg done at last clinic visit that did not show Evidence of osteomyelitis. She has been doing Dakin's wet-to-dry dressings without issues. She denies signs of infection. 9/27; patient presents for follow-up. Hydrofera Blue with antibiotic ointment was used under Kerlix/Coban at last clinic visit. She tolerated this well. She has no issues or complaints today. She denies signs of infection. 10/4; patient presents for follow-up. We have been using Hydrofera Blue to the wound bed all under Kerlix/Coban. She has no issues or complaints today. She has tolerated this well. 10/11; patient presents for follow-up. We have been using Hydrofera Blue to the wound bed under Kerlix/Coban. She has tolerated this well and has no issues or complaints. 10/18; patient presents for follow-up. We have been using Hydrofera Blue with Santyl to the wound bed under Kerlix/Coban. She has no issues or complaints today. 10/25; this is a patient with a right lower extremity medial leg ulcer in the setting of fairly severe chronic venous insufficiency. She has been using Santyl Hydrofera Blue rope Hydrofera Blue under kerlix Coban. She presents with 3 tunneling areas in the wound. 11/1; this is a patient with a difficult right lower extremity medial leg ulcer in the setting of fairly  clear chronic venous hypertension, hemosiderin deposition. This was initially traumatic in the setting of a fairly clear hematoma looking at cell phone pictures today. We have been using Hydrofera Blue under compression. 11/8; patient presents for follow-up. We have been using Hydrofera Blue blue under compression therapy. She has no issues or complaints today. Snap VAC was recommended at last clinic visit and this was run through insurance. We will ask for clarification on cost. 11/15; patient presents for follow-up. We have been using Hydrofera Blue with antibiotic ointment under compression therapy. Patient has no issues or complaints today. 11/22; patient presents for follow-up. We have been using silver alginate with antibiotic ointment under compression therapy. Patient denies signs of infection. 11/29; patient presents for follow-up. We have been using silver alginate with antibiotic ointment under compression therapy. One of the tunnels has healed  up. She has no issues or complaints today. 12/6; patient presents for follow-up. We have been using silver alginate with antibiotic ointment under compression therapy. She has no issues or complaints today. Objective Constitutional Vitals Time Taken: 2:44 PM, Weight: 172 lbs, Temperature: 98.1 F, Pulse: 89 bpm, Respiratory Rate: 18 breaths/min, Blood Pressure: 183/80 mmHg. [name], [name] ([id number]) [phone].pdf Page 6 of 7 General Notes: Right lower extremity: T the distal medial aspect there is 1 open wound that is narrow with some depth. No signs of infection. Good edema o control. Venous stasis dermatitis. Integumentary (Hair, Skin) Wound #1 status is Open. Original cause of wound was Trauma. The date acquired was: 06/27/2022. The wound has been in treatment 12 weeks. The wound is located on the Right,Medial Lower Leg. The wound measures 0.4cm length x 0.2cm width x 1.5cm depth; 0.063cm^2 area and 0.094cm^3  volume. There is Fat Layer (Subcutaneous Tissue) exposed. There is no undermining noted, however, there is tunneling at 3:00 with a maximum distance of 1.5cm. There is a medium amount of serosanguineous drainage noted. The wound margin is thickened. There is small (1-33%) red granulation within the wound bed. There is a large (67-100%) amount of necrotic tissue within the wound bed including Eschar and Adherent Slough. Assessment Active Problems ICD-10 Non-pressure chronic ulcer of other part of right lower leg with other specified severity Other early complications of trauma, initial encounter Traumatic secondary and recurrent hemorrhage and seroma, initial encounter Type 2 diabetes mellitus with other skin ulcer Hypothyroidism, unspecified Essential (primary) hypertension Chronic venous hypertension (idiopathic) with ulcer of right lower extremity Patient's wound is stable but overall appears well-healing.. I recommended continuing the course with Aquacel Ag and antibiotic ointment under 3 layer compression. Follow-up in 1 week. Procedures Wound #1 Pre-procedure diagnosis of Wound #1 is a Diabetic Wound/Ulcer of the Lower Extremity located on the Right,Medial Lower Leg . There was a Three Layer Compression Therapy Procedure with a pre-treatment ABI of 0.9 by Massie Kluver. Post procedure Diagnosis Wound #1: Same as Pre-Procedure Plan Follow-up Appointments: Wound #1 Right,Medial Lower Leg: Return Appointment in 1 week. Nurse Visit as needed Bathing/ Shower/ Hygiene: May shower with wound dressing protected with water repellent cover or cast protector. No tub bath. Edema Control - Lymphedema / Segmental Compressive Device / Other: 3 Layer Compression System for Lymphedema. - right lower leg Elevate legs to the level of the heart and pump ankles as often as possible Medications-Please add to medication list.: Topical Antibiotic - Gentamycin and Mupirocin WOUND #1: - Lower Leg Wound  Laterality: Right, Medial Cleanser: Dakin 16 (oz) 0.25 1 x Per Week/30 Days Discharge Instructions: Use as directed. Topical: Gentamicin 1 x Per Week/30 Days Discharge Instructions: Apply very small amount to hydrofera rope and place into tunnels Topical: Mupirocin Ointment 1 x Per Week/30 Days Discharge Instructions: Apply very small amount to hydrofera rope and place into tunnels Prim Dressing: AquacelAg Advantage Dressing, 2X2 (in/in) 1 x Per Week/30 Days ary Discharge Instructions: Apply to wound as directed Secondary Dressing: Zetuvit Plus 4x8 (in/in) 1 x Per Week/30 Days Com pression Wrap: 3-LAYER WRAP - Profore Lite LF 3 Multilayer Compression Bandaging System 1 x Per Week/30 Days Discharge Instructions: Apply 3 multi-layer wrap as prescribed. 1. Aquacel Ag with antibiotic ointment under 3 layer compression 2. Follow-up in 1 week Electronic Signature(s) Signed: 11/17/2022 3:52:35 PM By: Kalman Shan DO Entered By: Kalman Shan on 11/17/2022 15:27:49 [name] ([id number]) [id number] Page 7 of 7 -------------------------------------------------------------------------------- SuperBill Details Patient Name:  Date of Service: [name], [name] 11/17/2022 Medical Record Number: [id number] Patient Account Number: [id number] Date of Birth/Sex: Treating RN: 1941/04/14 (81 y.o. [name] Primary Care Provider: [name] Other Clinician: Massie Kluver Referring Provider: Treating Provider/Extender: Laural Roes in Treatment: 12 Diagnosis Coding ICD-10 Codes Code Description [id number] Non-pressure chronic ulcer of other part of right lower leg with other specified severity T79.8XXA Other early complications of trauma, initial encounter T30.2XXA Traumatic secondary and recurrent hemorrhage and seroma, initial encounter E11.622 Type 2 diabetes mellitus with other skin ulcer E03.9 Hypothyroidism,  unspecified I10 Essential (primary) hypertension I87.311 Chronic venous hypertension (idiopathic) with ulcer of right lower extremity Facility Procedures : CPT4 Code: [id number] Description: (Facility Use Only) [phone] - APPLY MULTLAY COMPRS LWR RT LEG ICD-10 Diagnosis Description L97.818 Non-pressure chronic ulcer of other part of right lower leg with other specified sever Modifier: ity Quantity: 1 Physician Procedures : CPT4 Code Description Modifier [id number] - WC PHYS LEVEL 3 - EST PT ICD-10 Diagnosis Description L97.818 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

## 2022-11-19 NOTE — Progress Notes (Signed)
[name] ([id number]) [id number] Page 1 of 10 Visit Report for 11/17/2022 Arrival Information Details Patient Name: Date of Service: [name], [name] 11/17/2022 2:15 PM Medical Record Number: [id number] Patient Account Number: [id number] Date of Birth/Sex: Treating RN: 1941/01/01 (81 y.o. [name] Primary Care Provider: [name] Other Clinician: Massie Kluver Referring Provider: Treating Provider/Extender: Laural Roes in Treatment: 12 Visit Information History Since Last Visit All ordered tests and consults were completed: No Patient Arrived: Ambulatory Added or deleted any medications: No Arrival Time: 14:35 Any new allergies or adverse reactions: No Transfer Assistance: None Had a fall or experienced change in No Patient Identification Verified: Yes activities of daily living that may affect Patient Requires Transmission-Based Precautions: No risk of falls: Patient Has Alerts: Yes Signs or symptoms of abuse/neglect since last visito No Patient Alerts: Patient on Blood Thinner Hospitalized since last visit: No 71m aspirin Implantable device outside of the clinic excluding No Type II Diabetic cellular tissue based products placed in the center since last visit: Has Dressing in Place as Prescribed: Yes Has Compression in Place as Prescribed: Yes Pain Present Now: No Electronic Signature(s) Signed: 11/19/2022 10:21:17 AM By: VMassie KluverEntered By: VMassie Kluveron 11/17/2022 14:44:15 -------------------------------------------------------------------------------- Clinic Level of Care Assessment Details Patient Name: Date of Service: EMARQUITA, LIAS12/05/2022 2:15 PM Medical Record Number: [id number] Account Number: [id number] of Birth/Sex: Treating RN: [birth year](81y.o. FDrema PryPrimary Care Provider: ZMia CreekOther Clinician: VMassie KluverReferring  Provider: Treating Provider/Extender: HLaural Roesin Treatment: 12 Clinic Level of Care Assessment Items TOOL 1 Quantity Score [] - 0 Use when EandM and Procedure is performed on INITIAL visit ASSESSMENTS - Nursing Assessment / Reassessment [] - 0 General Physical Exam (combine w/ comprehensive assessment (listed just below) when performed on new pt. evals)[name] ([id number] Page 2 of 10 [] - 0 Comprehensive Assessment (HX, ROS, Risk Assessments, Wounds Hx, etc.) ASSESSMENTS - Wound and Skin Assessment / Reassessment [] - 0 Dermatologic / Skin Assessment (not related to wound area) ASSESSMENTS - Ostomy and/or Continence Assessment and Care [] - 0 Incontinence Assessment and Management [] - 0 Ostomy Care Assessment and Management (repouching, etc.) PROCESS - Coordination of Care [] - 0 Simple Patient / Family Education for ongoing care [] - 0 Complex (extensive) Patient / Family Education for ongoing care [] - 0 Staff obtains CProgrammer, systems Records, T Results / Process Orders est [] - 0 Staff telephones HHA, Nursing Homes / Clarify orders / etc [] - 0 Routine Transfer to another Facility (non-emergent condition) [] - 0 Routine Hospital Admission (non-emergent condition) [] - 0 New Admissions / IBiomedical engineer/ Ordering NPWT Apligraf, etc. , [] - 0 Emergency Hospital Admission (emergent condition) PROCESS - Special Needs [] - 0 Pediatric / Minor Patient Management [] - 0 Isolation Patient Management [] - 0 Hearing / Language / Visual special needs [] - 0 Assessment of Community assistance (transportation, D/C planning, etc.) [] - 0 Additional assistance / Altered mentation [] - 0 Support Surface(s) Assessment (bed, cushion, seat, etc.) INTERVENTIONS - Miscellaneous [] - 0 External ear exam [] - 0 Patient Transfer (multiple staff / HCivil Service fast streamer/ Similar devices) [] - 0 Simple Staple  / Suture removal (25 or less) [] - 0 Complex Staple / Suture removal (26 or more) [] - 0 Hypo/Hyperglycemic Management (do not check if billed separately) [] - 0 Ankle / Brachial  Index (ABI) - do not check if billed separately Has the patient been seen at the hospital within the last three years: Yes Total Score: 0 Level Of Care: ____ Electronic Signature(s) Signed: 11/19/2022 10:21:17 AM By: Massie Kluver Entered By: Massie Kluver on 11/17/2022 15:25:28 -------------------------------------------------------------------------------- Compression Therapy Details Patient Name: Date of Service: [name] 11/17/2022 2:15 PM Medical Record Number: [id number] Patient Account Number: [id number] Date of Birth/Sex: Treating RN: 1941-10-07 (81 y.o. [name] Primary Care Provider: [name] Other Clinician: Adonai, Helzer [name] ([id number]) [id number] Page 3 of 10 Referring Provider: Treating Provider/Extender: Laural Roes in Treatment: 12 Compression Therapy Performed for Wound Assessment: Wound #1 Right,Medial Lower Leg Performed By: Lenice Pressman, [name], Compression Type: Three Layer Pre Treatment ABI: 0.9 Post Procedure Diagnosis Same as Pre-procedure Electronic Signature(s) Signed: 11/19/2022 10:21:17 AM By: Massie Kluver Entered By: Massie Kluver on 11/17/2022 15:24:44 -------------------------------------------------------------------------------- Encounter Discharge Information Details Patient Name: Date of Service: [name] 11/17/2022 2:15 PM Medical Record Number: [id number] Patient Account Number: [id number] Date of Birth/Sex: Treating RN: 1941/08/07 (81 y.o. [name] Primary Care Provider: [name] Other Clinician: Massie Kluver Referring Provider: Treating Provider/Extender: Laural Roes in Treatment: 12 Encounter Discharge Information  Items Discharge Condition: Stable Ambulatory Status: Ambulatory Discharge Destination: Home Transportation: Private Auto Accompanied By: daughter Schedule Follow-up Appointment: Yes Clinical Summary of Care: Electronic Signature(s) Signed: 11/19/2022 10:21:17 AM By: Massie Kluver Entered By: Massie Kluver on 11/17/2022 16:13:28 -------------------------------------------------------------------------------- Lower Extremity Assessment Details Patient Name: Date of Service: [name] 11/17/2022 2:15 PM Medical Record Number: [id number] Patient Account Number: [id number] Date of Birth/Sex: Treating RN: [birth year] (81 y.o. [name] Primary Care Provider: [name] Other Clinician: Massie Kluver Referring Provider: Treating Provider/Extender: Laural Roes in Treatment: [address], [place] [name] ([id number]) [id number] Page 4 of 10 Edema Assessment Assessed: [Left: No] [Right: Yes] Edema: [Left: Ye] [Right: s] Calf Left: Right: Point of Measurement: 30 cm From Medial Instep 34.3 cm Ankle Left: Right: Point of Measurement: 10 cm From Medial Instep 21 cm Vascular Assessment Pulses: Dorsalis Pedis Palpable: [Right:Yes] Electronic Signature(s) Signed: 11/17/2022 4:17:24 PM By: Rosalio Loud MSN RN CNS WTA Signed: 11/19/2022 10:21:17 AM By: Massie Kluver Entered By: Massie Kluver on 11/17/2022 14:59:26 -------------------------------------------------------------------------------- Multi Wound Chart Details Patient Name: Date of Service: [name] 11/17/2022 2:15 PM Medical Record Number: [id number] Patient Account Number: [id number] Date of Birth/Sex: Treating RN: [birth year] (81 y.o. 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 Primary Etiology: Extremity Hypertension, Type II Diabetes, N/A N/A Comorbid History: Osteoarthritis, Neuropathy, Received Chemotherapy, Received Radiation 06/27/2022 N/A N/A Date AcquiredSHABANA, ARMENTROUT ([id number]) [id number].pdf Page 5 of 10 12 N/A N/A Weeks of Treatment: Open N/A N/A Wound Status: No N/A N/A Wound Recurrence: Yes N/A N/A Clustered Wound: 0.4x0.2x1.5 N/A N/A Measurements L x W x D (cm) 0.063 N/A N/A A (cm) : rea 0.094 N/A N/A Volume (cm) : 99.00% N/A N/A % Reduction in A rea: 99.10% N/A N/A % Reduction in Volume: 3 Position 1 (o'clock): 1.5 Maximum Distance 1 (cm): Yes N/A N/A Tunneling: Grade 2 N/A  N/A Classification: Medium N/A N/A Exudate A mount: Serosanguineous N/A N/A Exudate Type: red, brown N/A N/A Exudate Color: Thickened N/A N/A Wound Margin: Small (1-33%) N/A N/A Granulation A mount: Red N/A N/A Granulation Quality: Large (67-100%) N/A N/A Necrotic A mount: Eschar, Adherent Slough N/A N/A Necrotic Tissue: Fat Layer (Subcutaneous Tissue): Yes N/A N/A Exposed Structures: Fascia: No Tendon: No Muscle: No Joint: No Bone: No None N/A N/A Epithelialization: Treatment Notes Electronic Signature(s) Signed: 11/19/2022 10:21:17 AM By: Massie Kluver Entered By: Massie Kluver on 11/17/2022 14:59:31 -------------------------------------------------------------------------------- Multi-Disciplinary Care Plan Details Patient Name: Date of Service: [name] 11/17/2022 2:15 PM Medical Record Number: [id number] Patient Account Number: [id number] Date of Birth/Sex: Treating RN: 08/15/1941 (81 y.o. [name] Primary  Care Provider: [name] Other Clinician: Massie Kluver Referring Provider: Treating Provider/Extender: Laural Roes in Treatment: 12 Active Inactive Necrotic Tissue Nursing Diagnoses: Impaired tissue integrity related to necrotic/devitalized tissue Knowledge deficit related to management of necrotic/devitalized tissue Goals: Necrotic/devitalized tissue will be minimized in the wound bed Date Initiated: 08/25/2022 Target Resolution Date: 08/25/2022 Goal Status: Active Patient/caregiver will verbalize understanding of reason and process for debridement of necrotic tissue Date Initiated: 08/25/2022 Target Resolution Date: 08/25/2022 Goal Status: Active Interventions: Assess patient pain level pre-, during and post procedure and prior to discharge HOLLAN, PHILIPP [name] ([id number]) [phone].pdf Page 6 of 10 Provide education on necrotic tissue and debridement process Treatment Activities: Excisional debridement : 08/25/2022 Notes: Pain, Acute or Chronic Nursing Diagnoses: Pain Management - Cyclic Acute (Dressing Change Related) Pain, acute or chronic: actual or potential Potential alteration in comfort, pain Goals: Patient will verbalize adequate pain control and receive pain control interventions during procedures as needed Date Initiated: 08/25/2022 Target Resolution Date: 08/25/2022 Goal Status: Active Patient/caregiver will verbalize adequate pain control between visits Date Initiated: 08/25/2022 Target Resolution Date: 08/25/2022 Goal Status: Active Patient/caregiver will verbalize comfort level met Date Initiated: 08/25/2022 Target Resolution Date: 08/25/2022 Goal Status: Active Interventions: Provide education on pain management Reposition patient for comfort Treatment Activities: Administer pain control measures as ordered : 08/25/2022 Notes: Peripheral Neuropathy Nursing Diagnoses: Knowledge deficit related to disease  process and management of peripheral neurovascular dysfunction Potential alteration in peripheral tissue perfusion (select prior to confirmation of diagnosis) Goals: Patient/caregiver will verbalize understanding of disease process and disease management Date Initiated: 08/25/2022 Target Resolution Date: 08/25/2022 Goal Status: Active Interventions: Assess signs and symptoms of neuropathy upon admission and as needed Provide education on Management of Neuropathy and Related Ulcers Notes: Wound/Skin Impairment Nursing Diagnoses: Impaired tissue integrity Knowledge deficit related to smoking impact on wound healing Knowledge deficit related to ulceration/compromised skin integrity Goals: Patient/caregiver will verbalize understanding of skin care regimen Date Initiated: 08/25/2022 Target Resolution Date: 08/25/2022 Goal Status: Active Ulcer/skin breakdown will have a volume reduction of 30% by week 4 Date Initiated: 08/25/2022 Date Inactivated: 11/17/2022 Target Resolution Date: 09/22/2022 Goal Status: Met Ulcer/skin breakdown will have a volume reduction of 50% by week 8 Date Initiated: 11/17/2022 Date Inactivated: 11/17/2022 Target Resolution Date: 10/20/2022 Goal Status: Met Ulcer/skin breakdown will have a volume reduction of 80% by week 12 Date Initiated: 11/17/2022 Date Inactivated: 11/17/2022 Target Resolution Date: 11/17/2022 Goal Status: Met Interventions: Assess ulceration(s) every visit Provide education on smoking NotesPORTLAND, SARINANA ([id number]) [phone].pdf Page 7 of 10 Electronic Signature(s) Signed: 11/17/2022 4:34:48 PM By: Gretta Cool, BSN, RN, CWS, Kim RN, BSN Signed: 11/17/2022 4:42:00 PM By: Rosalio Loud MSN RN CNS WTA Previous Signature: 11/17/2022 4:17:24 PM Version By: Rosalio Loud  MSN RN CNS WTA Entered By: Gretta Cool, BSN, RN, CWS, Kim on 11/17/2022 16:34:48 -------------------------------------------------------------------------------- Pain  Assessment Details Patient Name: Date of Service: AMRI, LIEN 11/17/2022 2:15 PM Medical Record Number: [id number] Patient Account Number: [id number] Date of Birth/Sex: Treating RN: [birth year] (81 y.o. [name] Primary Care Tnia Anglada: [name] Other Clinician: Massie Kluver Referring Jovonne Wilton: Treating Marquette Blodgett/Extender: Laural Roes in Treatment: 12 Active Problems Location of Pain Severity and Description of Pain Patient Has Paino No Site Locations Pain Management and Medication Current Pain Management: Electronic Signature(s) Signed: 11/17/2022 4:17:24 PM By: Rosalio Loud MSN RN CNS WTA Signed: 11/19/2022 10:21:17 AM By: Massie Kluver Entered By: Massie Kluver on 11/17/2022 14:47:33 Upson, Tametha [name] ([id number]) [id number] Page 8 of 10 -------------------------------------------------------------------------------- Patient/Caregiver Education Details Patient Name: Date of Service: KARLIE, AUNG 12/6/2023andnbsp2:15 PM Medical Record Number: [id number] Patient Account Number: [id number] Date of Birth/Gender: Treating RN: [birth year] (81 y.o. [name] Primary Care Physician: [name] Other Clinician: Massie Kluver Referring Physician: Treating Physician/Extender: Laural Roes in Treatment: 12 Education Assessment Education Provided To: Patient Education Topics Provided Wound/Skin Impairment: Handouts: Other: continue wound care as directed Methods: Explain/Verbal Responses: State content correctly Electronic Signature(s) Signed: 11/19/2022 10:21:17 AM By: Massie Kluver Entered By: Massie Kluver on 11/17/2022 15:26:20 -------------------------------------------------------------------------------- Wound Assessment Details Patient Name: Date of Service: [name] 11/17/2022 2:15 PM Medical Record Number: [id number] Patient Account Number:  [id number] Date of Birth/Sex: Treating RN: 01-Jan-1941 (81 y.o. [name] Primary Care Ishana Blades: [name] Other Clinician: Massie Kluver Referring Idrees Quam: Treating Austin Pongratz/Extender: Laural Roes in Treatment: 12 Wound Status Wound Number: 1 Primary Diabetic Wound/Ulcer of the Lower Extremity Etiology: Wound Location: Right, Medial Lower Leg Wound Open Wounding Event: Trauma Status: Date Acquired: 06/27/2022 Comorbid Hypertension, Type II Diabetes, Osteoarthritis, Neuropathy, Weeks Of Treatment: 12 History: Received Chemotherapy, Received Radiation Clustered Wound: Yes Photos Wound Measurements Length: (cm) 0.4 Kanaan, Kirbi [name] ([id number]) Width: (cm) Depth: (cm) Area: (cm) Volume: (cm) % Reduction in Area: 99% [id number] Page 9 of 10 0.2 % Reduction in Volume: 99.1% 1.5 Epithelialization: None 0.063 Tunneling: Yes 0.094 Position (o'clock): 3 Maximum Distance: (cm) 1.5 Undermining: No Wound Description Classification: Grade 2 Wound Margin: Thickened Exudate Amount: Medium Exudate Type: Serosanguineous Exudate Color: red, brown Foul Odor After Cleansing: No Slough/Fibrino Yes Wound Bed Granulation Amount: Small (1-33%) Exposed Structure Granulation Quality: Red Fascia Exposed: No Necrotic Amount: Large (67-100%) Fat Layer (Subcutaneous Tissue) Exposed: Yes Necrotic Quality: Eschar, Adherent Slough Tendon Exposed: No Muscle Exposed: No Joint Exposed: No Bone Exposed: No Treatment Notes Wound #1 (Lower Leg) Wound Laterality: Right, Medial Cleanser Dakin 16 (oz) 0.25 Discharge Instruction: Use as directed. Peri-Wound Care Topical Gentamicin Discharge Instruction: Apply very small amount to hydrofera rope and place into tunnels Mupirocin Ointment Discharge Instruction: Apply very small amount to hydrofera rope and place into tunnels Primary Dressing AquacelAg Advantage Dressing, 2X2  (in/in) Discharge Instruction: Apply to wound as directed Secondary Dressing Zetuvit Plus 4x8 (in/in) Secured With Compression Wrap 3-LAYER WRAP - Profore Lite LF 3 Multilayer Compression Bandaging System Discharge Instruction: Apply 3 multi-layer wrap as prescribed. Compression Stockings Add-Ons Electronic Signature(s) Signed: 11/17/2022 4:17:24 PM By: Rosalio Loud MSN RN CNS WTA Signed: 11/19/2022 10:21:17 AM By: Massie Kluver Entered By: Massie Kluver on 11/17/2022 14:58:22 Marlena Clipper [name] ([id number]) [id number] Page 10 of 10 -------------------------------------------------------------------------------- Vitals Details Patient Name: Date of Service: AUDRIANA, ALDAMA 11/17/2022 2:15 PM Medical Record Number: [id number] Patient  Account Number: [id number] Date of Birth/Sex: Treating RN: [birth year] (81 y.o. [name] Primary Care Genasis Zingale: [name] Other Clinician: Massie Kluver Referring Hesper Venturella: Treating Adalin Vanderploeg/Extender: Laural Roes in Treatment: 12 Vital Signs Time Taken: 14:44 Temperature (F): 98.1 Weight (lbs): 172 Pulse (bpm): 89 Respiratory Rate (breaths/min): 18 Blood Pressure (mmHg): 183/80 Reference Range: 80 - 120 mg / dl Electronic Signature(s) Signed: 11/19/2022 10:21:17 AM By: Massie Kluver Entered By: Massie Kluver on 11/17/2022 14:47:28

## 2022-11-24 ENCOUNTER — Encounter (HOSPITAL_BASED_OUTPATIENT_CLINIC_OR_DEPARTMENT_OTHER): Payer: Medicare PPO | Admitting: Internal Medicine

## 2022-11-24 DIAGNOSIS — I87311 Chronic venous hypertension (idiopathic) with ulcer of right lower extremity: Secondary | ICD-10-CM | POA: Diagnosis not present

## 2022-11-24 DIAGNOSIS — T798XXA Other early complications of trauma, initial encounter: Secondary | ICD-10-CM | POA: Diagnosis not present

## 2022-11-24 DIAGNOSIS — E11622 Type 2 diabetes mellitus with other skin ulcer: Secondary | ICD-10-CM | POA: Diagnosis not present

## 2022-11-24 DIAGNOSIS — L97818 Non-pressure chronic ulcer of other part of right lower leg with other specified severity: Secondary | ICD-10-CM

## 2022-11-24 NOTE — Progress Notes (Signed)
KEILY, LEPP ([id number]) [id number] Page 1 of 8 Visit Report for 11/24/2022 Chief Complaint Document Details Patient Name: Date of Service: ILIYAH, [name] 11/24/2022 2:15 PM Medical Record Number: [id number] Patient Account Number: [id number] Date of Birth/Sex: Treating RN: 1941/04/10 (80 y.o. [name] Primary Care Provider: [name] Other Clinician: Massie Kluver Referring Provider: Treating Provider/Extender: Laural Roes in Treatment: 13 Information Obtained from: Patient Chief Complaint 08/25/2022; right lower extremity wounds secondary to traumatic hematoma Electronic Signature(s) Signed: 11/24/2022 3:29:26 PM By: Kalman Shan DO Entered By: Kalman Shan on 11/24/2022 15:15:39 -------------------------------------------------------------------------------- HPI Details Patient Name: Date of Service: [name] 11/24/2022 2:15 PM Medical Record Number: [id number] Patient Account Number: [id number] Date of Birth/Sex: Treating RN: Mar 15, 1941 (81 y.o. [name] Primary Care Provider: [name] Other Clinician: Massie Kluver Referring Provider: Treating Provider/Extender: Laural Roes in Treatment: 13 History of Present Illness HPI Description: Admission 08/25/2022 [name] is an 80 year old female with a past medical history of breast cancer, with insulin dependent, controlled type 2 diabetes, chronic venous insufficiency that presents to the clinic for a 72-monthhistory of nonhealing ulcer to the right lower extremity. She states she hit her leg against a stair and developed a hematoma. The area was evacuated by her primary care physician. She has tried Neosporin and wet-to-dry dressings. She reports mild chronic pain to the area. She denies increased warmth, erythema or purulent drainage. 9/20; patient presents for follow-up. She had an x-ray of  the right leg done at last clinic visit that did not show Evidence of osteomyelitis. She has been doing Dakin's wet-to-dry dressings without issues. She denies signs of infection. 9/27; patient presents for follow-up. Hydrofera Blue with antibiotic ointment was used under Kerlix/Coban at last clinic visit. She tolerated this well. She has no issues or complaints today. She denies signs of infection. 10/4; patient presents for follow-up. We have been using Hydrofera Blue to the wound bed all under Kerlix/Coban. She has no issues or complaints today. She has tolerated this well. 10/11; patient presents for follow-up. We have been using Hydrofera Blue to the wound bed under Kerlix/Coban. She has tolerated this well and has no issues or complaints. [name], [name]([id number] Page 2 of 8 10/18; patient presents for follow-up. We have been using Hydrofera Blue with Santyl to the wound bed under Kerlix/Coban. She has no issues or complaints today. 10/25; this is a patient with a right lower extremity medial leg ulcer in the setting of fairly severe chronic venous insufficiency. She has been using Santyl Hydrofera Blue rope Hydrofera Blue under kerlix Coban. She presents with 3 tunneling areas in the wound. 11/1; this is a patient with a difficult right lower extremity medial leg ulcer in the setting of fairly clear chronic venous hypertension, hemosiderin deposition. This was initially traumatic in the setting of a fairly clear hematoma looking at cell phone pictures today. We have been using Hydrofera Blue under compression. 11/8; patient presents for follow-up. We have been using Hydrofera Blue blue under compression therapy. She has no issues or complaints today. Snap VAC was recommended at last clinic visit and this was run through insurance. We will ask for clarification on cost. 11/15; patient presents for follow-up. We have been using Hydrofera Blue with  antibiotic ointment under compression therapy. Patient has no issues or complaints today. 11/22; patient presents for follow-up. We have been using silver alginate with antibiotic ointment under compression therapy. Patient denies  signs of infection. 11/29; patient presents for follow-up. We have been using silver alginate with antibiotic ointment under compression therapy. One of the tunnels has healed up. She has no issues or complaints today. 12/6; patient presents for follow-up. We have been using silver alginate with antibiotic ointment under compression therapy. She has no issues or complaints today. 12/13; patient presents for follow-up. We have been using silver alginate with antibiotic ointment under compression therapy. She denies signs of infection. She hit her left leg against a box. She has some mild bruising. No open wound. Electronic Signature(s) Signed: 11/24/2022 3:29:26 PM By: Kalman Shan DO Entered By: Kalman Shan on 11/24/2022 15:17:25 -------------------------------------------------------------------------------- Physical Exam Details Patient Name: Date of Service: [name] 11/24/2022 2:15 PM Medical Record Number: [id number] Patient Account Number: [id number] Date of Birth/Sex: Treating RN: 05/26/1941 (81 y.o. [name] Primary Care Provider: [name] Other Clinician: Massie Kluver Referring Provider: Treating Provider/Extender: Laural Roes in Treatment: 13 Constitutional . Cardiovascular . Psychiatric . Notes Right lower extremity: T the distal medial aspect there is 1 open wound that is narrow with some depth. No signs of infection. Good edema control. Venous o stasis dermatitis. Electronic Signature(s) Signed: 11/24/2022 3:29:26 PM By: Kalman Shan DO Entered By: Kalman Shan on 11/24/2022 15:18:03 [name] ([id number]) [id number] Page 3 of  8 -------------------------------------------------------------------------------- Physician Orders Details Patient Name: Date of Service: [name], [name] 11/24/2022 2:15 PM Medical Record Number: [id number] Patient Account Number: [id number] Date of Birth/Sex: Treating RN: 12/02/1941 (81 y.o. [name] Primary Care Provider: [name] Other Clinician: Massie Kluver Referring Provider: Treating Provider/Extender: Laural Roes in Treatment: 69 Verbal / Phone Orders: No Diagnosis Coding Follow-up Appointments Wound #1 Right,Medial Lower Leg Return Appointment in 1 week. Nurse Visit as needed Bathing/ Shower/ Hygiene May shower with wound dressing protected with water repellent cover or cast protector. No tub bath. Edema Control - Lymphedema / Segmental Compressive Device / Other 4 Layer Compression System Lymphedema. - right lower leg Tubigrip single layer applied. - Tubi D single left lower leg Elevate legs to the level of the heart and pump ankles as often as possible Medications-Please add to medication list. ntibiotic - Gentamycin and Mupirocin Topical A Wound Treatment Wound #1 - Lower Leg Wound Laterality: Right, Medial Cleanser: Dakin 16 (oz) 0.25 1 x Per Week/30 Days Discharge Instructions: Use as directed. Topical: Gentamicin 1 x Per Week/30 Days Discharge Instructions: Apply very small amount to hydrofera rope and place into tunnels Topical: Mupirocin Ointment 1 x Per Week/30 Days Discharge Instructions: Apply very small amount to hydrofera rope and place into tunnels Prim Dressing: Endoform Natural, Non-fenestrated, 2x2 (in/in) 1 x Per Week/30 Days ary Discharge Instructions: apply tiny amount of antibiotic ointment and lightly pack into tunnel Secondary Dressing: Zetuvit Plus 4x8 (in/in) 1 x Per Week/30 Days Compression Wrap: Medichoice 4 layer Compression System, 35-40 mmHG 1 x Per Week/30 Days Discharge Instructions: Apply  multi-layer wrap as directed. Electronic Signature(s) Signed: 11/24/2022 3:29:26 PM By: Kalman Shan DO Entered By: Kalman Shan on 11/24/2022 15:20:18 [name] ([id number]) [id number] Page 4 of 8 -------------------------------------------------------------------------------- Problem List Details Patient Name: Date of Service: [name], [name] 11/24/2022 2:15 PM Medical Record Number: [id number] Patient Account Number: [id number] Date of Birth/Sex: Treating RN: February 11, 1941 (81 y.o. [name] Primary Care Provider: [name] Other Clinician: Massie Kluver Referring Provider: Treating Provider/Extender: Laural Roes in Treatment: 13 Active Problems ICD-10 Encounter  Code Description Active Date MDM Diagnosis L97.818 Non-pressure chronic ulcer of other part of right lower leg with other specified 08/25/2022 No Yes severity T79.8XXA Other early complications of trauma, initial encounter 08/25/2022 No Yes T79.2XXA Traumatic secondary and recurrent hemorrhage and seroma, initial encounter 08/25/2022 No Yes E11.622 Type 2 diabetes mellitus with other skin ulcer 08/25/2022 No Yes E03.9 Hypothyroidism, unspecified 08/25/2022 No Yes I10 Essential (primary) hypertension 08/25/2022 No Yes I87.311 Chronic venous hypertension (idiopathic) with ulcer of right lower extremity 08/25/2022 No Yes Inactive Problems Resolved Problems Electronic Signature(s) Signed: 11/24/2022 3:29:26 PM By: Kalman Shan DO Entered By: Kalman Shan on 11/24/2022 15:15:35 Marlena Clipper [name] ([id number]) [id number] Page 5 of 8 -------------------------------------------------------------------------------- Progress Note Details Patient Name: Date of Service: [name], [name] 11/24/2022 2:15 PM Medical Record Number: [id number] Patient Account Number: [id number] Date of Birth/Sex: Treating RN: 1941-01-20 (81  y.o. [name] Primary Care Provider: [name] Other Clinician: Massie Kluver Referring Provider: Treating Provider/Extender: Laural Roes in Treatment: 13 Subjective Chief Complaint Information obtained from Patient 08/25/2022; right lower extremity wounds secondary to traumatic hematoma History of Present Illness (HPI) Admission 08/25/2022 Ms. Serita Degroote is an 81 year old female with a past medical history of breast cancer, with insulin dependent, controlled type 2 diabetes, chronic venous insufficiency that presents to the clinic for a 35-monthhistory of nonhealing ulcer to the right lower extremity. She states she hit her leg against a stair and developed a hematoma. The area was evacuated by her primary care physician. She has tried Neosporin and wet-to-dry dressings. She reports mild chronic pain to the area. She denies increased warmth, erythema or purulent drainage. 9/20; patient presents for follow-up. She had an x-ray of the right leg done at last clinic visit that did not show Evidence of osteomyelitis. She has been doing Dakin's wet-to-dry dressings without issues. She denies signs of infection. 9/27; patient presents for follow-up. Hydrofera Blue with antibiotic ointment was used under Kerlix/Coban at last clinic visit. She tolerated this well. She has no issues or complaints today. She denies signs of infection. 10/4; patient presents for follow-up. We have been using Hydrofera Blue to the wound bed all under Kerlix/Coban. She has no issues or complaints today. She has tolerated this well. 10/11; patient presents for follow-up. We have been using Hydrofera Blue to the wound bed under Kerlix/Coban. She has tolerated this well and has no issues or complaints. 10/18; patient presents for follow-up. We have been using Hydrofera Blue with Santyl to the wound bed under Kerlix/Coban. She has no issues or complaints today. 10/25; this is a  patient with a right lower extremity medial leg ulcer in the setting of fairly severe chronic venous insufficiency. She has been using Santyl Hydrofera Blue rope Hydrofera Blue under kerlix Coban. She presents with 3 tunneling areas in the wound. 11/1; this is a patient with a difficult right lower extremity medial leg ulcer in the setting of fairly clear chronic venous hypertension, hemosiderin deposition. This was initially traumatic in the setting of a fairly clear hematoma looking at cell phone pictures today. We have been using Hydrofera Blue under compression. 11/8; patient presents for follow-up. We have been using Hydrofera Blue blue under compression therapy. She has no issues or complaints today. Snap VAC was recommended at last clinic visit and this was run through insurance. We will ask for clarification on cost. 11/15; patient presents for follow-up. We have been using Hydrofera Blue with antibiotic ointment under compression therapy. Patient has no  issues or complaints today. 11/22; patient presents for follow-up. We have been using silver alginate with antibiotic ointment under compression therapy. Patient denies signs of infection. 11/29; patient presents for follow-up. We have been using silver alginate with antibiotic ointment under compression therapy. One of the tunnels has healed up. She has no issues or complaints today. 12/6; patient presents for follow-up. We have been using silver alginate with antibiotic ointment under compression therapy. She has no issues or complaints today. 12/13; patient presents for follow-up. We have been using silver alginate with antibiotic ointment under compression therapy. She denies signs of infection. She hit her left leg against a box. She has some mild bruising. No open wound. Objective Constitutional [name], [name] ([id number]) [id number] Page 6 of 8 Vitals Time Taken: 2:22 PM, Weight: 172 lbs, Temperature:  98.2 F, Pulse: 89 bpm, Respiratory Rate: 18 breaths/min, Blood Pressure: 182/81 mmHg. General Notes: Right lower extremity: T the distal medial aspect there is 1 open wound that is narrow with some depth. No signs of infection. Good edema o control. Venous stasis dermatitis. Integumentary (Hair, Skin) Wound #1 status is Open. Original cause of wound was Trauma. The date acquired was: 06/27/2022. The wound has been in treatment 13 weeks. The wound is located on the Right,Medial Lower Leg. The wound measures 0.5cm length x 0.2cm width x 1.5cm depth; 0.079cm^2 area and 0.118cm^3 volume. There is Fat Layer (Subcutaneous Tissue) exposed. There is no undermining noted, however, there is tunneling at 3:00 with a maximum distance of 1.5cm. There is a medium amount of serosanguineous drainage noted. The wound margin is thickened. There is small (1-33%) red granulation within the wound bed. There is a large (67-100%) amount of necrotic tissue within the wound bed including Eschar and Adherent Slough. 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 At this time I recommended switching the dressing to see if this will help facilitate further wound healing. I recommended endoform and continuing antibiotic ointment. She has significant hemosiderin staining and may benefit from an increase in compression. We will go from 3 layer to 4 layer compression. She has tolerated the wraps well in the past. Follow-up in 1 week. Of note she has a bruise to the left leg. I recommended compression stockings as  she has uncontrolled leg swelling from venous insufficiency. We will give her Tubigrip in office. Procedures Wound #1 Pre-procedure diagnosis of Wound #1 is a Diabetic Wound/Ulcer of the Lower Extremity located on the Right,Medial Lower Leg . There was a Four Layer Compression Therapy Procedure with a pre-treatment ABI of 0.9 by Massie Kluver. Post procedure Diagnosis Wound #1: Same as Pre-Procedure Plan Follow-up Appointments: Wound #1 Right,Medial Lower Leg: Return Appointment in 1 week. Nurse Visit as needed Bathing/ Shower/ Hygiene: May shower with wound dressing protected with water repellent cover or cast protector. No tub bath. Edema Control - Lymphedema / Segmental Compressive Device / Other: 4 Layer Compression System Lymphedema. - right lower leg Tubigrip single layer applied. - Tubi D single left lower leg Elevate legs to the level of the heart and pump ankles as often as possible Medications-Please add to medication list.: Topical Antibiotic - Gentamycin and Mupirocin WOUND #1: - Lower  Leg Wound Laterality: Right, Medial Cleanser: Dakin 16 (oz) 0.25 1 x Per Week/30 Days Discharge Instructions: Use as directed. Topical: Gentamicin 1 x Per Week/30 Days Discharge Instructions: Apply very small amount to hydrofera rope and place into tunnels Topical: Mupirocin Ointment 1 x Per Week/30 Days Discharge Instructions: Apply very small amount to hydrofera rope and place into tunnels Prim Dressing: Endoform Natural, Non-fenestrated, 2x2 (in/in) 1 x Per Week/30 Days ary Discharge Instructions: apply tiny amount of antibiotic ointment and lightly pack into tunnel Secondary Dressing: Zetuvit Plus 4x8 (in/in) 1 x Per Week/30 Days Com pression Wrap: Medichoice 4 layer Compression System, 35-40 mmHG 1 x Per Week/30 Days Discharge Instructions: Apply multi-layer wrap as directed. 1. Endoform with antibiotic ointment under 4-layer compressionooright lower extremity 2. Tubigrip to the  left lower extremity 3. Follow-up in 1 week [name], [name] ([id number]) [id number] Page 7 of 8 Electronic Signature(s) Signed: 11/24/2022 3:29:26 PM By: Kalman Shan DO Entered By: Kalman Shan on 11/24/2022 15:19:29 -------------------------------------------------------------------------------- ROS/PFSH Details Patient Name: Date of Service: [name] 11/24/2022 2:15 PM Medical Record Number: [id number] Patient Account Number: [id number] Date of Birth/Sex: Treating RN: 02/02/1941 (81 y.o. [name] Primary Care Provider: [name] Other Clinician: Massie Kluver Referring Provider: Treating Provider/Extender: Laural Roes in Treatment: 13 Cardiovascular Medical History: Positive for: Hypertension Endocrine Medical History: Positive for: Type II Diabetes Time with diabetes: 40+ Treated with: Insulin Blood sugar tested every day: Yes Tested : 67 Musculoskeletal Medical History: Positive for: Osteoarthritis - Shoulders Neurologic Medical History: Positive for: Neuropathy Oncologic Medical History: Positive for: Received Chemotherapy; Received Radiation Immunizations Pneumococcal Vaccine: Received Pneumococcal Vaccination: Yes Received Pneumococcal Vaccination On or After 60th Birthday: Yes Implantable Devices None Family and Social History Never smoker; Marital Status - Married; Alcohol Use: Never; Drug Use: No History; Caffeine Use: Daily Electronic Signature(s) Signed: 11/24/2022 3:29:26 PM By: Kalman Shan DO Signed: 11/24/2022 4:26:00 PM By: Rosalio Loud MSN RN CNS WTA Entered By: Kalman Shan on 11/24/2022 15:20:26 [name] ([id number]) [id number] Page 8 of 8 -------------------------------------------------------------------------------- SuperBill Details Patient Name: Date of Service: [name], [name] 11/24/2022 Medical Record  Number: [id number] Patient Account Number: [id number] Date of Birth/Sex: Treating RN: 03-09-1941 (81 y.o. 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 severity T79.8XXA Other early complications of trauma, initial encounter T50.2XXA Traumatic secondary and recurrent hemorrhage and seroma, initial encounter E11.622 Type 2 diabetes mellitus with other skin ulcer E03.9 Hypothyroidism, unspecified I10 Essential (primary) hypertension I87.311 Chronic venous hypertension (idiopathic) with ulcer of right lower extremity Facility Procedures : CPT4 Code: [id number] Description: (Facility Use Only) [phone] - APPLY MULTLAY COMPRS LWR RT LEG ICD-10 Diagnosis Description L97.818 Non-pressure chronic ulcer of other part of right lower leg with other specified sever Modifier: ity Quantity: 1 Physician Procedures : CPT4 Code Description Modifier [id number] - WC PHYS LEVEL 3 - EST PT ICD-10 Diagnosis Description L97.818 Non-pressure chronic ulcer of other part of right lower leg with other specified severity T79.8XXA Other early complications of trauma, initial  encounter I87.311 Chronic venous hypertension (idiopathic) with ulcer of right lower extremity E11.622 Type 2 diabetes mellitus with other skin ulcer Quantity: 1 Electronic Signature(s) Signed: 11/24/2022 3:29:26 PM By: Kalman Shan DO Entered By: Kalman Shan on 11/24/2022 15:20:03

## 2022-11-30 NOTE — Progress Notes (Signed)
IFRAH, VEST ([id number]) [id number] Page 1 of 10 Visit Report for 11/24/2022 Arrival Information Details Patient Name: Date of Service: [name], [name] 11/24/2022 2:15 PM Medical Record Number: [id number] Patient Account Number: [id number] Date of Birth/Sex: Treating RN: 1941-04-17 (81 y.o. [name] Primary Care Provider: [name] Other Clinician: Massie Kluver Referring Provider: Treating Provider/Extender: Laural Roes in Treatment: 13 Visit Information History Since Last Visit All ordered tests and consults were completed: No Patient Arrived: Ambulatory Added or deleted any medications: No Arrival Time: 14:06 Any new allergies or adverse reactions: No Transfer Assistance: None Had a fall or experienced change in No Patient Identification Verified: Yes activities of daily living that may affect Secondary Verification Process Completed: Yes risk of falls: Patient Requires Transmission-Based Precautions: No Signs or symptoms of abuse/neglect since last visito No Patient Has Alerts: Yes Hospitalized since last visit: No Patient Alerts: Patient on Blood Thinner Implantable device outside of the clinic excluding No 53m aspirin cellular tissue based products placed in the center Type II Diabetic since last visit: Has Dressing in Place as Prescribed: Yes Has Compression in Place as Prescribed: Yes Pain Present Now: No Electronic Signature(s) Signed: 11/30/2022 4:44:06 PM By: VMassie KluverEntered By: VMassie Kluveron 11/24/2022 14:22:02 -------------------------------------------------------------------------------- Clinic Level of Care Assessment Details Patient Name: Date of Service: [name], [name]12/13/2023 2:15 PM Medical Record Number: [id number] Account Number: [id number] of Birth/Sex: Treating RN: 609/03/1941(81y.o. FDrema PryPrimary Care Provider: ZMia CreekOther  Clinician: VMassie KluverReferring Provider: Treating Provider/Extender: HLaural Roesin Treatment: 13 Clinic Level of Care Assessment Items TOOL 1 Quantity Score [] - 0 Use when EandM and Procedure is performed on INITIAL visit ASSESSMENTS - Nursing Assessment / Reassessment [] - 0 General Physical Exam (combine w/ comprehensive assessment (listed just below) when performed on new pt. evals)ASHLEA, DUSINGB ([id number] Page 2 of 10 [] - 0 Comprehensive Assessment (HX, ROS, Risk Assessments, Wounds Hx, etc.) ASSESSMENTS - Wound and Skin Assessment / Reassessment [] - 0 Dermatologic / Skin Assessment (not related to wound area) ASSESSMENTS - Ostomy and/or Continence Assessment and Care [] - 0 Incontinence Assessment and Management [] - 0 Ostomy Care Assessment and Management (repouching, etc.) PROCESS - Coordination of Care [] - 0 Simple Patient / Family Education for ongoing care [] - 0 Complex (extensive) Patient / Family Education for ongoing care [] - 0 Staff obtains CProgrammer, systems Records, T Results / Process Orders est [] - 0 Staff telephones HHA, Nursing Homes / Clarify orders / etc [] - 0 Routine Transfer to another Facility (non-emergent condition) [] - 0 Routine Hospital Admission (non-emergent condition) [] - 0 New Admissions / IBiomedical engineer/ Ordering NPWT Apligraf, etc. , [] - 0 Emergency Hospital Admission (emergent condition) PROCESS - Special Needs [] - 0 Pediatric / Minor Patient Management [] - 0 Isolation Patient Management [] - 0 Hearing / Language / Visual special needs [] - 0 Assessment of Community assistance (transportation, D/C planning, etc.) [] - 0 Additional assistance / Altered mentation [] - 0 Support Surface(s) Assessment (bed, cushion, seat, etc.) INTERVENTIONS - Miscellaneous [] - 0 External ear exam [] - 0 Patient Transfer (multiple staff / HCivil Service fast streamer/  Similar devices) [] - 0 Simple Staple / Suture removal (25 or less) [] - 0 Complex Staple / Suture removal (26 or more) [] - 0 Hypo/Hyperglycemic Management (do not check if billed separately) [] -  0 Ankle / Brachial Index (ABI) - do not check if billed separately Has the patient been seen at the hospital within the last three years: Yes Total Score: 0 Level Of Care: ____ Electronic Signature(s) Signed: 11/30/2022 4:44:06 PM By: Massie Kluver Entered By: Massie Kluver on 11/24/2022 15:14:02 -------------------------------------------------------------------------------- Compression Therapy Details Patient Name: Date of Service: [name] 11/24/2022 2:15 PM Medical Record Number: [id number] Patient Account Number: [id number] Date of Birth/Sex: Treating RN: 06/26/1941 (81 y.o. [name] Primary Care Alejo Beamer: [name] Other Clinician: Ahrianna, Siglin B ([id number]) [id number] Page 3 of 10 Referring Armstrong Creasy: Treating Jenny Lai/Extender: Laural Roes in Treatment: 13 Compression Therapy Performed for Wound Assessment: Wound #1 Right,Medial Lower Leg Performed By: Lenice Pressman, [name], Compression Type: Four Layer Pre Treatment ABI: 0.9 Post Procedure Diagnosis Same as Pre-procedure Electronic Signature(s) Signed: 11/30/2022 4:44:06 PM By: Massie Kluver Entered By: Massie Kluver on 11/24/2022 15:13:44 -------------------------------------------------------------------------------- Encounter Discharge Information Details Patient Name: Date of Service: [name] 11/24/2022 2:15 PM Medical Record Number: [id number] Patient Account Number: [id number] Date of Birth/Sex: Treating RN: May 01, 1941 (81 y.o. [name] Primary Care Jaelynn Pozo: [name] Other Clinician: Massie Kluver Referring Blakelyn Dinges: Treating Jovin Fester/Extender: Laural Roes in  Treatment: 13 Encounter Discharge Information Items Discharge Condition: Stable Ambulatory Status: Ambulatory Discharge Destination: Home Transportation: Private Auto Accompanied By: daughter Schedule Follow-up Appointment: Yes Clinical Summary of Care: Electronic Signature(s) Signed: 11/30/2022 4:44:06 PM By: Massie Kluver Entered By: Massie Kluver on 11/24/2022 17:16:16 -------------------------------------------------------------------------------- Lower Extremity Assessment Details Patient Name: Date of Service: ZELPHIA, GLOVER 11/24/2022 2:15 PM Medical Record Number: [id number] Patient Account Number: [id number] Date of Birth/Sex: Treating RN: [birth year] (81 y.o. [name] Primary Care Hewitt Garner: [name] Other Clinician: Massie Kluver Referring Kelwin Gibler: Treating Shray Hunley/Extender: Laural Roes in Treatment: [address] [place] ([id number]) [id number] Page 4 of 10 Edema Assessment Assessed: [Left: No] [Right: Yes] Edema: [Left: Ye] [Right: s] Calf Left: Right: Point of Measurement: 30 cm From Medial Instep 30.8 cm Ankle Left: Right: Point of Measurement: 10 cm From Medial Instep 21.4 cm Vascular Assessment Pulses: Dorsalis Pedis Palpable: [Right:Yes] Electronic Signature(s) Signed: 11/24/2022 4:26:00 PM By: Rosalio Loud MSN RN CNS WTA Signed: 11/30/2022 4:44:06 PM By: Massie Kluver Entered By: Massie Kluver on 11/24/2022 14:36:37 -------------------------------------------------------------------------------- Multi Wound Chart Details Patient Name: Date of Service: [name] 11/24/2022 2:15 PM Medical Record Number: [id number] Patient Account Number: [id number] Date of Birth/Sex: Treating RN: 28-Apr-1941 (81 y.o. 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 Primary Etiology: Extremity Hypertension, Type II Diabetes, N/A N/A Comorbid History: Osteoarthritis, Neuropathy, Received Chemotherapy, Received Radiation 06/27/2022 N/A N/A Date AcquiredASHLYNE, OLENICK ([id number]) [id number] Page 5 of 10 13 N/A N/A Weeks of Treatment: Open N/A N/A Wound Status: No N/A N/A Wound Recurrence: Yes N/A N/A Clustered Wound: 0.5x0.2x1.5 N/A N/A Measurements L x W x D (cm) 0.079 N/A N/A A (cm) : rea 0.118 N/A N/A Volume (cm) : 98.80% N/A N/A % Reduction in A rea: 98.90% N/A N/A % Reduction in Volume: 3 Position 1 (o'clock): 1.5 Maximum Distance 1 (cm): Yes N/A N/A  Tunneling: Grade 2 N/A N/A Classification: Medium N/A N/A Exudate A mount: Serosanguineous N/A N/A Exudate Type: red, brown N/A N/A Exudate Color: Thickened N/A N/A Wound Margin: Small (1-33%) N/A N/A Granulation A mount: Red N/A N/A Granulation Quality: Large (67-100%) N/A N/A Necrotic A mount: Eschar, Adherent Slough N/A N/A Necrotic Tissue: Fat Layer (Subcutaneous Tissue): Yes N/A N/A Exposed Structures: Fascia: No Tendon: No Muscle: No Joint: No Bone: No None N/A N/A Epithelialization: Treatment Notes Electronic Signature(s) Signed: 11/30/2022 4:44:06 PM By: Massie Kluver Entered By: Massie Kluver on 11/24/2022 14:37:55 -------------------------------------------------------------------------------- Multi-Disciplinary Care Plan Details Patient Name: Date of Service: [name] 11/24/2022 2:15 PM Medical Record Number: [id number] Patient Account Number: [id number] Date of Birth/Sex:  Treating RN: February 04, 1941 (81 y.o. [name] Primary Care Provider: [name] Other Clinician: Massie Kluver Referring Provider: Treating Provider/Extender: Laural Roes in Treatment: 13 Active Inactive Necrotic Tissue Nursing Diagnoses: Impaired tissue integrity related to necrotic/devitalized tissue Knowledge deficit related to management of necrotic/devitalized tissue Goals: Necrotic/devitalized tissue will be minimized in the wound bed Date Initiated: 08/25/2022 Target Resolution Date: 08/25/2022 Goal Status: Active Patient/caregiver will verbalize understanding of reason and process for debridement of necrotic tissue Date Initiated: 08/25/2022 Target Resolution Date: 08/25/2022 Goal Status: Active Interventions: Assess patient pain level pre-, during and post procedure and prior to discharge DIEDRE, MACLELLAN B ([id number]) [id number] Page 6 of 10 Provide education on necrotic tissue and debridement process Treatment Activities: Excisional debridement : 08/25/2022 Notes: Pain, Acute or Chronic Nursing Diagnoses: Pain Management - Cyclic Acute (Dressing Change Related) Pain, acute or chronic: actual or potential Potential alteration in comfort, pain Goals: Patient will verbalize adequate pain control and receive pain control interventions during procedures as needed Date Initiated: 08/25/2022 Target Resolution Date: 08/25/2022 Goal Status: Active Patient/caregiver will verbalize adequate pain control between visits Date Initiated: 08/25/2022 Target Resolution Date: 08/25/2022 Goal Status: Active Patient/caregiver will verbalize comfort level met Date Initiated: 08/25/2022 Target Resolution Date: 08/25/2022 Goal Status: Active Interventions: Provide education on pain management Reposition patient for comfort Treatment Activities: Administer pain control measures as ordered : 08/25/2022 Notes: Peripheral  Neuropathy Nursing Diagnoses: Knowledge deficit related to disease process and management of peripheral neurovascular dysfunction Potential alteration in peripheral tissue perfusion (select prior to confirmation of diagnosis) Goals: Patient/caregiver will verbalize understanding of disease process and disease management Date Initiated: 08/25/2022 Target Resolution Date: 08/25/2022 Goal Status: Active Interventions: Assess signs and symptoms of neuropathy upon admission and as needed Provide education on Management of Neuropathy and Related Ulcers Notes: Wound/Skin Impairment Nursing Diagnoses: Impaired tissue integrity Knowledge deficit related to smoking impact on wound healing Knowledge deficit related to ulceration/compromised skin integrity Goals: Patient/caregiver will verbalize understanding of skin care regimen Date Initiated: 08/25/2022 Target Resolution Date: 08/25/2022 Goal Status: Active Ulcer/skin breakdown will have a volume reduction of 30% by week 4 Date Initiated: 08/25/2022 Date Inactivated: 11/17/2022 Target Resolution Date: 09/22/2022 Goal Status: Met Ulcer/skin breakdown will have a volume reduction of 50% by week 8 Date Initiated: 11/17/2022 Date Inactivated: 11/17/2022 Target Resolution Date: 10/20/2022 Goal Status: Met Ulcer/skin breakdown will have a volume reduction of 80% by week 12 Date Initiated: 11/17/2022 Date Inactivated: 11/17/2022 Target Resolution Date: 11/17/2022 Goal Status: Met Interventions: Assess ulceration(s) every visit Provide education on smoking NotesEDMUND, RICK ([id number]) [id number] Page 7 of 10 Electronic Signature(s) Signed: 11/25/2022 5:27:53 PM By: Rosalio Loud MSN RN CNS WTA Signed: 11/30/2022 4:44:06 PM By: Massie Kluver Entered By: Massie Kluver on 11/24/2022 17:15:47 -------------------------------------------------------------------------------- Pain Assessment  Details Patient Name: Date  of Service: LEONELA, KIVI 11/24/2022 2:15 PM Medical Record Number: [id number] Patient Account Number: [id number] Date of Birth/Sex: Treating RN: 1941/03/09 (81 y.o. [name] Primary Care Sharmel Ballantine: [name] Other Clinician: Massie Kluver Referring Duward Allbritton: Treating Karlie Aung/Extender: Laural Roes in Treatment: 13 Active Problems Location of Pain Severity and Description of Pain Patient Has Paino No Site Locations Pain Management and Medication Current Pain Management: Electronic Signature(s) Signed: 11/24/2022 4:26:00 PM By: Rosalio Loud MSN RN CNS WTA Signed: 11/30/2022 4:44:06 PM By: Massie Kluver Entered By: Massie Kluver on 11/24/2022 14:25:49 Marlena Clipper B ([id number]) [id number] Page 8 of 10 -------------------------------------------------------------------------------- Patient/Caregiver Education Details Patient Name: Date of Service: JARICA, PLASS 12/13/2023andnbsp2:15 PM Medical Record Number: [id number] Patient Account Number: [id number] Date of Birth/Gender: Treating RN: 1941-06-03 (81 y.o. [name] Primary Care Physician: [name] Other Clinician: Massie Kluver Referring Physician: Treating Physician/Extender: Laural Roes in Treatment: 13 Education Assessment Education Provided To: Patient Education Topics Provided Wound/Skin Impairment: Handouts: Other: continue wound care as directed Methods: Explain/Verbal Responses: State content correctly Electronic Signature(s) Signed: 11/30/2022 4:44:06 PM By: Massie Kluver Entered By: Massie Kluver on 11/24/2022 17:15:42 -------------------------------------------------------------------------------- Wound Assessment Details Patient Name: Date of Service: [name] 11/24/2022 2:15 PM Medical Record Number: [id number] Patient Account Number: [id number] Date of Birth/Sex: Treating  RN: October 11, 1941 (81 y.o. [name] Primary Care Ivette Castronova: [name] Other Clinician: Massie Kluver Referring Lonnette Shrode: Treating Harlei Lehrmann/Extender: Laural Roes in Treatment: 13 Wound Status Wound Number: 1 Primary Diabetic Wound/Ulcer of the Lower Extremity Etiology: Wound Location: Right, Medial Lower Leg Wound Open Wounding Event: Trauma Status: Date Acquired: 06/27/2022 Comorbid Hypertension, Type II Diabetes, Osteoarthritis, Neuropathy, Weeks Of Treatment: 13 History: Received Chemotherapy, Received Radiation Clustered Wound: Yes Photos Wound Measurements Length: (cm) 0.5 Maggio, Treniya B ([id number]) Width: (cm) Depth: (cm) Area: (cm) Volume: (cm) % Reduction in Area: 98.8% [id number] Page 9 of 10 0.2 % Reduction in Volume: 98.9% 1.5 Epithelialization: None 0.079 Tunneling: Yes 0.118 Position (o'clock): 3 Maximum Distance: (cm) 1.5 Undermining: No Wound Description Classification: Grade 2 Wound Margin: Thickened Exudate Amount: Medium Exudate Type: Serosanguineous Exudate Color: red, brown Foul Odor After Cleansing: No Slough/Fibrino Yes Wound Bed Granulation Amount: Small (1-33%) Exposed Structure Granulation Quality: Red Fascia Exposed: No Necrotic Amount: Large (67-100%) Fat Layer (Subcutaneous Tissue) Exposed: Yes Necrotic Quality: Eschar, Adherent Slough Tendon Exposed: No Muscle Exposed: No Joint Exposed: No Bone Exposed: No Treatment Notes Wound #1 (Lower Leg) Wound Laterality: Right, Medial Cleanser Dakin 16 (oz) 0.25 Discharge Instruction: Use as directed. Peri-Wound Care Topical Gentamicin Discharge Instruction: Apply very small amount to hydrofera rope and place into tunnels Mupirocin Ointment Discharge Instruction: Apply very small amount to hydrofera rope and place into tunnels Primary Dressing Endoform Natural, Non-fenestrated, 2x2 (in/in) Discharge Instruction: apply  tiny amount of antibiotic ointment and lightly pack into tunnel Secondary Dressing Zetuvit Plus 4x8 (in/in) Secured With Compression Wrap Medichoice 4 layer Compression System, 35-40 mmHG Discharge Instruction: Apply multi-layer wrap as directed. Compression Stockings Add-Ons Electronic Signature(s) Signed: 11/24/2022 4:26:00 PM By: Rosalio Loud MSN RN CNS WTA Signed: 11/30/2022 4:44:06 PM By: Massie Kluver Entered By: Massie Kluver on 11/24/2022 14:35:17 Marlena Clipper B ([id number]) [id number] Page 10 of 10 -------------------------------------------------------------------------------- Vitals Details Patient Name: Date of Service: VERENA, SHAWGO 11/24/2022 2:15 PM Medical Record Number: [id number] Patient Account Number: [id number] Date of Birth/Sex: Treating RN: 07/05/1941 (81 y.o. [name] Primary Care  Eurydice Calixto: [name] Other Clinician: Massie Kluver Referring Yadhira Mckneely: Treating Gary Gabrielsen/Extender: Laural Roes in Treatment: 13 Vital Signs Time Taken: 14:22 Temperature (F): 98.2 Weight (lbs): 172 Pulse (bpm): 89 Respiratory Rate (breaths/min): 18 Blood Pressure (mmHg): 182/81 Reference Range: 80 - 120 mg / dl Electronic Signature(s) Signed: 11/30/2022 4:44:06 PM By: Massie Kluver Entered By: Massie Kluver on 11/24/2022 14:25:45

## 2022-12-01 ENCOUNTER — Encounter (HOSPITAL_BASED_OUTPATIENT_CLINIC_OR_DEPARTMENT_OTHER): Payer: Medicare PPO | Admitting: Internal Medicine

## 2022-12-01 DIAGNOSIS — E11622 Type 2 diabetes mellitus with other skin ulcer: Secondary | ICD-10-CM

## 2022-12-01 DIAGNOSIS — I87311 Chronic venous hypertension (idiopathic) with ulcer of right lower extremity: Secondary | ICD-10-CM | POA: Diagnosis not present

## 2022-12-01 DIAGNOSIS — T798XXA Other early complications of trauma, initial encounter: Secondary | ICD-10-CM | POA: Diagnosis not present

## 2022-12-01 DIAGNOSIS — L97818 Non-pressure chronic ulcer of other part of right lower leg with other specified severity: Secondary | ICD-10-CM | POA: Diagnosis not present

## 2022-12-01 NOTE — Progress Notes (Signed)
NATHANIEL, YADEN ([id number]) [id number] Page 1 of 8 Visit Report for 12/01/2022 Chief Complaint Document Details Patient Name: Date of Service: [name], [name] 12/01/2022 2:15 PM Medical Record Number: [id number] Patient Account Number: [id number] Date of Birth/Sex: Treating RN: September 08, 1941 (80 y.o. [name] Primary Care Provider: [name] Other Clinician: Massie Kluver Referring Provider: Treating Provider/Extender: Laural Roes in Treatment: 14 Information Obtained from: Patient Chief Complaint 08/25/2022; right lower extremity wounds secondary to traumatic hematoma Electronic Signature(s) Signed: 12/01/2022 4:54:10 PM By: Kalman Shan DO Entered By: Kalman Shan on 12/01/2022 15:53:37 -------------------------------------------------------------------------------- HPI Details Patient Name: Date of Service: [name] 12/01/2022 2:15 PM Medical Record Number: [id number] Patient Account Number: [id number] Date of Birth/Sex: Treating RN: June 25, 1941 (81 y.o. [name] Primary Care Provider: [name] Other Clinician: Massie Kluver Referring Provider: Treating Provider/Extender: Laural Roes in Treatment: 14 History of Present Illness HPI Description: Admission 08/25/2022 Ms. Brizza Nathanson is an 81 year old female with a past medical history of breast cancer, with insulin dependent, controlled type 2 diabetes, chronic venous insufficiency that presents to the clinic for a 64-monthhistory of nonhealing ulcer to the right lower extremity. She states she hit her leg against a stair and developed a hematoma. The area was evacuated by her primary care physician. She has tried Neosporin and wet-to-dry dressings. She reports mild chronic pain to the area. She denies increased warmth, erythema or purulent drainage. 9/20; patient presents for follow-up. She had an x-ray of the  right leg done at last clinic visit that did not show Evidence of osteomyelitis. She has been doing Dakin's wet-to-dry dressings without issues. She denies signs of infection. 9/27; patient presents for follow-up. Hydrofera Blue with antibiotic ointment was used under Kerlix/Coban at last clinic visit. She tolerated this well. She has no issues or complaints today. She denies signs of infection. 10/4; patient presents for follow-up. We have been using Hydrofera Blue to the wound bed all under Kerlix/Coban. She has no issues or complaints today. She has tolerated this well. 10/11; patient presents for follow-up. We have been using Hydrofera Blue to the wound bed under Kerlix/Coban. She has tolerated this well and has no issues or complaints. EROGELIO, WINBUSH([id number] Page 2 of 8 10/18; patient presents for follow-up. We have been using Hydrofera Blue with Santyl to the wound bed under Kerlix/Coban. She has no issues or complaints today. 10/25; this is a patient with a right lower extremity medial leg ulcer in the setting of fairly severe chronic venous insufficiency. She has been using Santyl Hydrofera Blue rope Hydrofera Blue under kerlix Coban. She presents with 3 tunneling areas in the wound. 11/1; this is a patient with a difficult right lower extremity medial leg ulcer in the setting of fairly clear chronic venous hypertension, hemosiderin deposition. This was initially traumatic in the setting of a fairly clear hematoma looking at cell phone pictures today. We have been using Hydrofera Blue under compression. 11/8; patient presents for follow-up. We have been using Hydrofera Blue blue under compression therapy. She has no issues or complaints today. Snap VAC was recommended at last clinic visit and this was run through insurance. We will ask for clarification on cost. 11/15; patient presents for follow-up. We have been using Hydrofera Blue with  antibiotic ointment under compression therapy. Patient has no issues or complaints today. 11/22; patient presents for follow-up. We have been using silver alginate with antibiotic ointment under compression therapy. Patient denies  signs of infection. 11/29; patient presents for follow-up. We have been using silver alginate with antibiotic ointment under compression therapy. One of the tunnels has healed up. She has no issues or complaints today. 12/6; patient presents for follow-up. We have been using silver alginate with antibiotic ointment under compression therapy. She has no issues or complaints today. 12/13; patient presents for follow-up. We have been using silver alginate with antibiotic ointment under compression therapy. She denies signs of infection. She hit her left leg against a box. She has some mild bruising. No open wound. 12/20; patient presents for follow-up. We have been using endoform with antibiotic ointment under compression therapy. Patient has no issues or complaints today. The ecchymosis has resolved to the left lower extremity. Electronic Signature(s) Signed: 12/01/2022 4:54:10 PM By: Kalman Shan DO Entered By: Kalman Shan on 12/01/2022 15:54:17 -------------------------------------------------------------------------------- Physical Exam Details Patient Name: Date of Service: [name] 12/01/2022 2:15 PM Medical Record Number: [id number] Patient Account Number: [id number] Date of Birth/Sex: Treating RN: June 08, 1941 (81 y.o. [name] Primary Care Provider: [name] Other Clinician: Massie Kluver Referring Provider: Treating Provider/Extender: Laural Roes in Treatment: 14 Constitutional . Cardiovascular . Psychiatric . Notes Right lower extremity: T the distal medial aspect there is 1 open wound that is narrow with some depth. Scant thick yellow drainage noted on palpation. Good o edema control. Venous  stasis dermatitis. Electronic Signature(s) Signed: 12/01/2022 4:54:10 PM By: Kalman Shan DO Entered By: Kalman Shan on 12/01/2022 15:55:05 Marlena Clipper B ([id number]) [id number] Page 3 of 8 -------------------------------------------------------------------------------- Physician Orders Details Patient Name: Date of Service: RASHMI, TALLENT 12/01/2022 2:15 PM Medical Record Number: [id number] Patient Account Number: [id number] Date of Birth/Sex: Treating RN: 11-Aug-1941 (81 y.o. [name] Primary Care Provider: [name] Other Clinician: Massie Kluver Referring Provider: Treating Provider/Extender: Laural Roes in Treatment: 14 Verbal / Phone Orders: No Diagnosis Coding Follow-up Appointments Wound #1 Right,Medial Lower Leg Return Appointment in 1 week. Nurse Visit as needed Bathing/ Shower/ Hygiene May shower with wound dressing protected with water repellent cover or cast protector. No tub bath. Edema Control - Lymphedema / Segmental Compressive Device / Other 4 Layer Compression System Lymphedema. - right lower leg Tubigrip single layer applied. - Tubi D single left lower leg Elevate legs to the level of the heart and pump ankles as often as possible Medications-Please add to medication list. ntibiotics - start clindamycin P.O. A ntibiotic - Gentamycin and Mupirocin Topical A Wound Treatment Wound #1 - Lower Leg Wound Laterality: Right, Medial Cleanser: Dakin 16 (oz) 0.25 1 x Per Week/30 Days Discharge Instructions: Use as directed. Topical: Gentamicin 1 x Per Week/30 Days Discharge Instructions: Apply very small amount to hydrofera rope and place into tunnels Topical: Mupirocin Ointment 1 x Per Week/30 Days Discharge Instructions: Apply very small amount to hydrofera rope and place into tunnels Prim Dressing: Endoform Natural, Non-fenestrated, 2x2 (in/in) 1 x Per Week/30 Days ary Discharge  Instructions: apply tiny amount of antibiotic ointment and lightly pack into tunnel Secondary Dressing: Zetuvit Plus 4x8 (in/in) 1 x Per Week/30 Days Secondary Dressing: hydrofera blue rope 1 x Per Week/30 Days Discharge Instructions: pack small piece into top of wound to keep open Compression Wrap: Medichoice 4 layer Compression System, 35-40 mmHG 1 x Per Week/30 Days Discharge Instructions: Apply multi-layer wrap as directed. Electronic Signature(s) Signed: 12/01/2022 4:54:10 PM By: Kalman Shan DO Previous Signature: 12/01/2022 3:36:35 PM Version By: Kalman Shan DO Entered By: Heber [place],  Elicia Lui on 12/01/2022 15:58:40 KISSY, CIELO ([id number]) [id number] Page 4 of 8 -------------------------------------------------------------------------------- Problem List Details Patient Name: Date of Service: TASHEENA, WAMBOLT 12/01/2022 2:15 PM Medical Record Number: [id number] Patient Account Number: [id number] Date of Birth/Sex: Treating RN: 1941-03-21 (81 y.o. [name] Primary Care Provider: [name] Other Clinician: Massie Kluver Referring Provider: Treating Provider/Extender: Laural Roes in Treatment: 14 Active Problems ICD-10 Encounter Code Description Active Date MDM Diagnosis L97.818 Non-pressure chronic ulcer of other part of right lower leg with other specified 08/25/2022 No Yes severity T79.8XXA Other early complications of trauma, initial encounter 08/25/2022 No Yes T79.2XXA Traumatic secondary and recurrent hemorrhage and seroma, initial encounter 08/25/2022 No Yes E11.622 Type 2 diabetes mellitus with other skin ulcer 08/25/2022 No Yes E03.9 Hypothyroidism, unspecified 08/25/2022 No Yes I10 Essential (primary) hypertension 08/25/2022 No Yes I87.311 Chronic venous hypertension (idiopathic) with ulcer of right lower extremity 08/25/2022 No Yes Inactive Problems Resolved Problems Electronic  Signature(s) Signed: 12/01/2022 4:54:10 PM By: Kalman Shan DO Entered By: Kalman Shan on 12/01/2022 15:53:32 Jumper, Makinna B ([id number]) [id number] Page 5 of 8 -------------------------------------------------------------------------------- Progress Note Details Patient Name: Date of Service: LAYCIE, SCHRINER 12/01/2022 2:15 PM Medical Record Number: [id number] Patient Account Number: [id number] Date of Birth/Sex: Treating RN: 12-Oct-1941 (81 y.o. [name] Primary Care Provider: [name] Other Clinician: Massie Kluver Referring Provider: Treating Provider/Extender: Laural Roes in Treatment: 14 Subjective Chief Complaint Information obtained from Patient 08/25/2022; right lower extremity wounds secondary to traumatic hematoma History of Present Illness (HPI) Admission 08/25/2022 Ms. Ariell Gunnels is an 81 year old female with a past medical history of breast cancer, with insulin dependent, controlled type 2 diabetes, chronic venous insufficiency that presents to the clinic for a 24-monthhistory of nonhealing ulcer to the right lower extremity. She states she hit her leg against a stair and developed a hematoma. The area was evacuated by her primary care physician. She has tried Neosporin and wet-to-dry dressings. She reports mild chronic pain to the area. She denies increased warmth, erythema or purulent drainage. 9/20; patient presents for follow-up. She had an x-ray of the right leg done at last clinic visit that did not show Evidence of osteomyelitis. She has been doing Dakin's wet-to-dry dressings without issues. She denies signs of infection. 9/27; patient presents for follow-up. Hydrofera Blue with antibiotic ointment was used under Kerlix/Coban at last clinic visit. She tolerated this well. She has no issues or complaints today. She denies signs of infection. 10/4; patient presents for follow-up. We have  been using Hydrofera Blue to the wound bed all under Kerlix/Coban. She has no issues or complaints today. She has tolerated this well. 10/11; patient presents for follow-up. We have been using Hydrofera Blue to the wound bed under Kerlix/Coban. She has tolerated this well and has no issues or complaints. 10/18; patient presents for follow-up. We have been using Hydrofera Blue with Santyl to the wound bed under Kerlix/Coban. She has no issues or complaints today. 10/25; this is a patient with a right lower extremity medial leg ulcer in the setting of fairly severe chronic venous insufficiency. She has been using Santyl Hydrofera Blue rope Hydrofera Blue under kerlix Coban. She presents with 3 tunneling areas in the wound. 11/1; this is a patient with a difficult right lower extremity medial leg ulcer in the setting of fairly clear chronic venous hypertension, hemosiderin deposition. This was initially traumatic in the setting of a fairly clear hematoma looking at cell phone  pictures today. We have been using Hydrofera Blue under compression. 11/8; patient presents for follow-up. We have been using Hydrofera Blue blue under compression therapy. She has no issues or complaints today. Snap VAC was recommended at last clinic visit and this was run through insurance. We will ask for clarification on cost. 11/15; patient presents for follow-up. We have been using Hydrofera Blue with antibiotic ointment under compression therapy. Patient has no issues or complaints today. 11/22; patient presents for follow-up. We have been using silver alginate with antibiotic ointment under compression therapy. Patient denies signs of infection. 11/29; patient presents for follow-up. We have been using silver alginate with antibiotic ointment under compression therapy. One of the tunnels has healed up. She has no issues or complaints today. 12/6; patient presents for follow-up. We have been using silver alginate with  antibiotic ointment under compression therapy. She has no issues or complaints today. 12/13; patient presents for follow-up. We have been using silver alginate with antibiotic ointment under compression therapy. She denies signs of infection. She hit her left leg against a box. She has some mild bruising. No open wound. 12/20; patient presents for follow-up. We have been using endoform with antibiotic ointment under compression therapy. Patient has no issues or complaints today. The ecchymosis has resolved to the left lower extremity. PEPPER, KERRICK ([id number]) [id number] Page 6 of 8 Constitutional Vitals Time Taken: 2:43 PM, Weight: 172 lbs, Temperature: 97.9 F, Pulse: 78 bpm, Respiratory Rate: 18 breaths/min, Blood Pressure: 188/72 mmHg. General Notes: Right lower extremity: T the distal medial aspect there is 1 open wound that is narrow with some depth. Scant thick yellow drainage noted on o palpation. Good edema control. Venous stasis dermatitis. Integumentary (Hair, Skin) Wound #1 status is Open. Original cause of wound was Trauma. The date acquired was: 06/27/2022. The wound has been in treatment 14 weeks. The wound is located on the Right,Medial Lower Leg. The wound measures 0.1cm length x 0.1cm width x 1.8cm depth; 0.008cm^2 area and 0.014cm^3 volume. There is Fat Layer (Subcutaneous Tissue) exposed. There is no undermining noted, however, there is tunneling at 3:00 with a maximum distance of 1.8cm. There is a medium amount of serosanguineous drainage noted. The wound margin is thickened. There is small (1-33%) red granulation within the wound bed. There is a large (67-100%) amount of necrotic tissue within the wound bed including Eschar and Adherent Slough. Assessment Active Problems ICD-10 Non-pressure chronic ulcer of other part of right lower leg with other specified severity Other early complications of trauma, initial  encounter Traumatic secondary and recurrent hemorrhage and seroma, initial encounter Type 2 diabetes mellitus with other skin ulcer Hypothyroidism, unspecified Essential (primary) hypertension Chronic venous hypertension (idiopathic) with ulcer of right lower extremity Patient's wound is stable. Since there was some thick yellow drainage on exam I recommended an oral antibiotic to see if this will help further facilitate wound healing in the setting of a possible deep infection. Soft tissue has no erythema or increased warmth. I prescribed clindamycin. For now I recommended continuing with endoform and antibiotic ointment under 4-layer compression. Follow-up in 1 week. Of note patient states she is susceptible to yeast infections when finishing a course of antibiotics. I went ahead and sent in 1 dose of fluconazole in case she were to develop symptoms. Procedures Wound #1 Pre-procedure diagnosis of Wound #1 is a Diabetic Wound/Ulcer of the Lower Extremity located on the Right,Medial Lower Leg . There was a Four Layer Compression Therapy Procedure with a pre-treatment  ABI of 0.9 by Massie Kluver. Post procedure Diagnosis Wound #1: Same as Pre-Procedure Plan Follow-up Appointments: Wound #1 Right,Medial Lower Leg: Return Appointment in 1 week. Nurse Visit as needed Bathing/ Shower/ Hygiene: May shower with wound dressing protected with water repellent cover or cast protector. No tub bath. Edema Control - Lymphedema / Segmental Compressive Device / Other: 4 Layer Compression System Lymphedema. - right lower leg Tubigrip single layer applied. - Tubi D single left lower leg Elevate legs to the level of the heart and pump ankles as often as possible Medications-Please add to medication list.: P.O. Antibiotics - start clindamycin Topical Antibiotic - Gentamycin and Mupirocin WOUND #1: - Lower Leg Wound Laterality: Right, Medial Cleanser: Dakin 16 (oz) 0.25 1 x Per Week/30 Days Discharge  Instructions: Use as directed. Topical: Gentamicin 1 x Per Week/30 Days Discharge Instructions: Apply very small amount to hydrofera rope and place into tunnels Topical: Mupirocin Ointment 1 x Per Week/30 Days Discharge Instructions: Apply very small amount to hydrofera rope and place into tunnels Prim Dressing: Endoform Natural, Non-fenestrated, 2x2 (in/in) 1 x Per Week/30 Days ary Discharge Instructions: apply tiny amount of antibiotic ointment and lightly pack into tunnel Secondary Dressing: Zetuvit Plus 4x8 (in/in) 1 x Per Week/30 Days Secondary Dressing: hydrofera blue rope 1 x Per Week/30 Days Discharge Instructions: pack small piece into top of wound to keep open Com pression Wrap: Medichoice 4 layer Compression System, 35-40 mmHG 1 x Per Week/30 Days Discharge Instructions: Apply multi-layer wrap as directed. TABIA, LANDOWSKI ([id number]) [id number] Page 7 of 8 1. Antibiotic ointment with endoform under 4-layer compressionooright lower extremity 2. Clindamycin 3. Follow-up in 1 week Electronic Signature(s) Signed: 12/01/2022 4:54:10 PM By: Kalman Shan DO Entered By: Kalman Shan on 12/01/2022 15:57:52 -------------------------------------------------------------------------------- ROS/PFSH Details Patient Name: Date of Service: [name] 12/01/2022 2:15 PM Medical Record Number: [id number] Patient Account Number: [id number] Date of Birth/Sex: Treating RN: May 04, 1941 (81 y.o. [name] Primary Care Provider: [name] Other Clinician: Massie Kluver Referring Provider: Treating Provider/Extender: Laural Roes in Treatment: 14 Cardiovascular Medical History: Positive for: Hypertension Endocrine Medical History: Positive for: Type II Diabetes Time with diabetes: 40+ Treated with: Insulin Blood sugar tested every day: Yes Tested : 67 Musculoskeletal Medical History: Positive for:  Osteoarthritis - Shoulders Neurologic Medical History: Positive for: Neuropathy Oncologic Medical History: Positive for: Received Chemotherapy; Received Radiation Immunizations Pneumococcal Vaccine: Received Pneumococcal Vaccination: Yes Received Pneumococcal Vaccination On or After 60th Birthday: Yes Implantable Devices None Family and Social History Never smoker; Marital Status - Married; Alcohol Use: Never; Drug Use: No History; Caffeine Use: Daily SHARRY, BEINING ([id number]) [id number] Page 8 of 8 Electronic Signature(s) Signed: 12/01/2022 4:54:10 PM By: Kalman Shan DO Signed: 12/02/2022 3:46:10 PM By: Gretta Cool, BSN, RN, CWS, Kim RN, BSN Entered By: Kalman Shan on 12/01/2022 15:58:51 -------------------------------------------------------------------------------- Major Details Patient Name: Date of Service: [name] 12/01/2022 Medical Record Number: [id number] Patient Account Number: [id number] Date of Birth/Sex: Treating RN: [birth year] (81 y.o. 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 severity T79.8XXA Other early complications of trauma, initial encounter T26.2XXA Traumatic secondary and recurrent hemorrhage and seroma, initial encounter E11.622 Type 2 diabetes mellitus with other skin ulcer E03.9 Hypothyroidism, unspecified I10 Essential (primary) hypertension I87.311 Chronic venous hypertension (idiopathic)  with ulcer of right lower extremity Facility Procedures : CPT4 Code: [id number] Description: (Facility Use Only) [phone] - APPLY MULTLAY COMPRS LWR RT LEG ICD-10 Diagnosis Description L97.818 Non-pressure chronic ulcer of other part of  right lower leg with other specified sever Modifier: ity Quantity: 1 Physician Procedures : CPT4 Code Description Modifier [id number] - WC PHYS LEVEL 4 - EST PT ICD-10 Diagnosis Description L97.818 Non-pressure chronic ulcer of other part of right lower leg with other specified severity T79.8XXA Other early complications of trauma, initial  encounter E11.622 Type 2 diabetes mellitus with other skin ulcer I87.311 Chronic venous hypertension (idiopathic) with ulcer of right lower extremity Quantity: 1 Electronic Signature(s) Signed: 12/01/2022 4:54:10 PM By: Kalman Shan DO Entered By: Kalman Shan on 12/01/2022 15:58:32

## 2022-12-03 NOTE — Progress Notes (Signed)
[name] ([id number]) [id number] Page 1 of 10 Visit Report for 12/01/2022 Arrival Information Details Patient Name: Date of Service: [name], [name] 12/01/2022 2:15 PM Medical Record Number: [id number] Patient Account Number: [id number] Date of Birth/Sex: Treating RN: October 24, 1941 (81 y.o. [name] Primary Care Maela Takeda: [name] Other Clinician: Massie Kluver Referring Bresha Hosack: Treating Lynard Postlewait/Extender: Laural Roes in Treatment: 14 Visit Information History Since Last Visit All ordered tests and consults were completed: No Patient Arrived: Ambulatory Added or deleted any medications: No Arrival Time: 14:35 Any new allergies or adverse reactions: No Transfer Assistance: None Had a fall or experienced change in No Patient Identification Verified: Yes activities of daily living that may affect Secondary Verification Process Completed: Yes risk of falls: Patient Requires Transmission-Based Precautions: No Signs or symptoms of abuse/neglect since last visito No Patient Has Alerts: Yes Hospitalized since last visit: No Patient Alerts: Patient on Blood Thinner Implantable device outside of the clinic excluding No 55m aspirin cellular tissue based products placed in the center Type II Diabetic since last visit: Has Dressing in Place as Prescribed: Yes Has Compression in Place as Prescribed: Yes Pain Present Now: No Electronic Signature(s) Signed: 12/03/2022 9:48:27 AM By: VMassie KluverEntered By: VMassie Kluveron 12/01/2022 14:43:18 -------------------------------------------------------------------------------- Clinic Level of Care Assessment Details Patient Name: Date of Service: [name], [name]12/20/2023 2:15 PM Medical Record Number: [id number] Account Number: [id number] of Birth/Sex: Treating RN: 6November 04, 1942(81y.o. FMarlowe ShoresPrimary Care Syncere Kaminski: ZMia CreekOther  Clinician: VMassie KluverReferring Daequan Kozma: Treating Baleigh Rennaker/Extender: HLaural Roesin Treatment: 14 Clinic Level of Care Assessment Items TOOL 1 Quantity Score _0  - 0 Use when EandM and Procedure is performed on INITIAL visit ASSESSMENTS - Nursing Assessment / Reassessment _1  - 0 General Physical Exam (combine w/ comprehensive assessment (listed just below) when performed on new pt. evals)[name]([id number] Page 2 of 10 _2  - 0 Comprehensive Assessment (HX, ROS, Risk Assessments, Wounds Hx, etc.) ASSESSMENTS - Wound and Skin Assessment / Reassessment _3  - 0 Dermatologic / Skin Assessment (not related to wound area) ASSESSMENTS - Ostomy and/or Continence Assessment and Care _4  - 0 Incontinence Assessment and Management _5  - 0 Ostomy Care Assessment and Management (repouching, etc.) PROCESS - Coordination of Care _6  - 0 Simple Patient / Family Education for ongoing care _7  - 0 Complex (extensive) Patient / Family Education for ongoing care _8  - 0 Staff obtains CProgrammer, systems Records, T Results / Process Orders est _9  - 0 Staff telephones HHA, Nursing Homes / Clarify orders / etc _10  - 0 Routine Transfer to another Facility (non-emergent condition) _11  - 0 Routine Hospital Admission (non-emergent condition) _12  - 0 New Admissions / IBiomedical engineer/ Ordering NPWT Apligraf, etc. , _13  - 0 Emergency Hospital Admission (emergent condition) PROCESS - Special Needs _14  - 0 Pediatric / Minor Patient Management _15  - 0 Isolation Patient Management _16  - 0 Hearing / Language / Visual special needs _17  - 0 Assessment of Community assistance (transportation, D/C planning, etc.) _18  - 0 Additional assistance / Altered mentation _19  - 0 Support Surface(s) Assessment (bed, cushion, seat, etc.) INTERVENTIONS - Miscellaneous _20  - 0 External ear exam _21  - 0 Patient Transfer (multiple staff / HCivil Service fast streamer/  Similar devices) _22  - 0 Simple Staple / Suture removal (25 or less) _23  - 0 Complex Staple / Suture removal (26 or more) _24  - 0 Hypo/Hyperglycemic Management (do not check if billed separately) _25  -  0 Ankle / Brachial Index (ABI) - do not check if billed separately Has the patient been seen at the hospital within the last three years: Yes Total Score: 0 Level Of Care: ____ Electronic Signature(s) Signed: 12/03/2022 9:48:27 AM By: Massie Kluver Entered By: Massie Kluver on 12/01/2022 15:18:27 -------------------------------------------------------------------------------- Compression Therapy Details Patient Name: Date of Service: [name] 12/01/2022 2:15 PM Medical Record Number: [id number] Patient Account Number: [id number] Date of Birth/Sex: Treating RN: 1941/06/13 (81 y.o. [name] Primary Care Deania Siguenza: [name] Other Clinician: Katelynd, Blauvelt B ([id number]) [id number] Page 3 of 10 Referring Jaideep Pollack: Treating Montray Kliebert/Extender: Laural Roes in Treatment: 14 Compression Therapy Performed for Wound Assessment: Wound #1 Right,Medial Lower Leg Performed By: Lenice Pressman, [name], Compression Type: Four Layer Pre Treatment ABI: 0.9 Post Procedure Diagnosis Same as Pre-procedure Electronic Signature(s) Signed: 12/03/2022 9:48:27 AM By: Massie Kluver Entered By: Massie Kluver on 12/01/2022 15:18:09 -------------------------------------------------------------------------------- Encounter Discharge Information Details Patient Name: Date of Service: [name] 12/01/2022 2:15 PM Medical Record Number: [id number] Patient Account Number: [id number] Date of Birth/Sex: Treating RN: 1941/09/03 (81 y.o. [name] Primary Care Aubreanna Percle: [name] Other Clinician: Massie Kluver Referring Jamorian Dimaria: Treating Carianne Taira/Extender: Laural Roes in Treatment:  14 Encounter Discharge Information Items Discharge Condition: Stable Ambulatory Status: Ambulatory Discharge Destination: Home Transportation: Private Auto Accompanied By: daughter Schedule Follow-up Appointment: Yes Clinical Summary of Care: Electronic Signature(s) Signed: 12/03/2022 9:48:27 AM By: Massie Kluver Entered By: Massie Kluver on 12/01/2022 15:20:05 -------------------------------------------------------------------------------- Lower Extremity Assessment Details Patient Name: Date of Service: KINLEIGH, NAULT 12/01/2022 2:15 PM Medical Record Number: [id number] Patient Account Number: [id number] Date of Birth/Sex: Treating RN: April 09, 1941 (81 y.o. [name] Primary Care Duane Trias: [name] Other Clinician: Massie Kluver Referring Derotha Fishbaugh: Treating Addasyn Mcbreen/Extender: Laural Roes in Treatment: [address] [place] ([id number]) [id number] Page 4 of 10 Edema Assessment Assessed: [Left: No] [Right: Yes] Edema: [Left: Ye] [Right: s] Calf Left: Right: Point of Measurement: 30 cm From Medial Instep 33.5 cm Ankle Left: Right: Point of Measurement: 10 cm From Medial Instep 21.5 cm Vascular Assessment Pulses: Dorsalis Pedis Palpable: [Right:Yes] Electronic Signature(s) Signed: 12/02/2022 3:46:10 PM By: Gretta Cool, BSN, RN, CWS, Kim RN, BSN Signed: 12/03/2022 9:48:27 AM By: Massie Kluver Entered By: Massie Kluver on 12/01/2022 15:00:56 -------------------------------------------------------------------------------- Multi Wound Chart Details Patient Name: Date of Service: [name] 12/01/2022 2:15 PM Medical Record Number: [id number] Patient Account Number: [id number] Date of Birth/Sex: Treating RN: 01/29/1941 (81 y.o. [name] Primary Care Airi Copado: [name] Other Clinician: Massie Kluver Referring Angelys Yetman: Treating Karisma Meiser/Extender: Laural Roes in Treatment:  14 Vital Signs Height(in): Pulse(bpm): 78 Weight(lbs): 172 Blood Pressure(mmHg): 188/72 Body Mass Index(BMI): Temperature(F): 97.9 Respiratory Rate(breaths/min): 18 [1:Photos:] [N/A:N/A] Right, Medial Lower Leg N/A N/A Wound Location: Trauma N/A N/A Wounding Event: Diabetic Wound/Ulcer of the Lower N/A N/A Primary Etiology: Extremity Hypertension, Type II Diabetes, N/A N/A Comorbid History: Osteoarthritis, Neuropathy, Received Chemotherapy, Received Radiation 06/27/2022 N/A N/A Date AcquiredDANYAH, GUASTELLA ([id number]) [id number] Page 5 of 10 14 N/A N/A Weeks of Treatment: Open N/A N/A Wound Status: No N/A N/A Wound Recurrence: Yes N/A N/A Clustered Wound: 0.1x0.1x1.8 N/A N/A Measurements L x W x D (cm) 0.008 N/A N/A A (cm) : rea 0.014 N/A N/A Volume (cm) : 99.90% N/A N/A % Reduction in A rea: 99.90% N/A N/A % Reduction in Volume: 3 Position 1 (o'clock): 1.8 Maximum Distance 1 (cm): Yes N/A  N/A Tunneling: Grade 2 N/A N/A Classification: Medium N/A N/A Exudate A mount: Serosanguineous N/A N/A Exudate Type: red, brown N/A N/A Exudate Color: Thickened N/A N/A Wound Margin: Small (1-33%) N/A N/A Granulation A mount: Red N/A N/A Granulation Quality: Large (67-100%) N/A N/A Necrotic A mount: Eschar, Adherent Slough N/A N/A Necrotic Tissue: Fat Layer (Subcutaneous Tissue): Yes N/A N/A Exposed Structures: Fascia: No Tendon: No Muscle: No Joint: No Bone: No None N/A N/A Epithelialization: Treatment Notes Electronic Signature(s) Signed: 12/03/2022 9:48:27 AM By: Massie Kluver Entered By: Massie Kluver on 12/01/2022 15:01:20 -------------------------------------------------------------------------------- Multi-Disciplinary Care Plan Details Patient Name: Date of Service: [name] 12/01/2022 2:15 PM Medical Record Number: [id number] Patient Account Number: [id number] Date of Birth/Sex: Treating  RN: 07-05-1941 (81 y.o. [name] Primary Care Keslyn Teater: [name] Other Clinician: Massie Kluver Referring Naman Spychalski: Treating Aariona Momon/Extender: Laural Roes in Treatment: 14 Active Inactive Necrotic Tissue Nursing Diagnoses: Impaired tissue integrity related to necrotic/devitalized tissue Knowledge deficit related to management of necrotic/devitalized tissue Goals: Necrotic/devitalized tissue will be minimized in the wound bed Date Initiated: 08/25/2022 Target Resolution Date: 08/25/2022 Goal Status: Active Patient/caregiver will verbalize understanding of reason and process for debridement of necrotic tissue Date Initiated: 08/25/2022 Target Resolution Date: 08/25/2022 Goal Status: Active Interventions: Assess patient pain level pre-, during and post procedure and prior to discharge HAGEN, TIDD B ([id number]) [id number] Page 6 of 10 Provide education on necrotic tissue and debridement process Treatment Activities: Excisional debridement : 08/25/2022 Notes: Pain, Acute or Chronic Nursing Diagnoses: Pain Management - Cyclic Acute (Dressing Change Related) Pain, acute or chronic: actual or potential Potential alteration in comfort, pain Goals: Patient will verbalize adequate pain control and receive pain control interventions during procedures as needed Date Initiated: 08/25/2022 Target Resolution Date: 08/25/2022 Goal Status: Active Patient/caregiver will verbalize adequate pain control between visits Date Initiated: 08/25/2022 Target Resolution Date: 08/25/2022 Goal Status: Active Patient/caregiver will verbalize comfort level met Date Initiated: 08/25/2022 Target Resolution Date: 08/25/2022 Goal Status: Active Interventions: Provide education on pain management Reposition patient for comfort Treatment Activities: Administer pain control measures as ordered : 08/25/2022 Notes: Peripheral Neuropathy Nursing  Diagnoses: Knowledge deficit related to disease process and management of peripheral neurovascular dysfunction Potential alteration in peripheral tissue perfusion (select prior to confirmation of diagnosis) Goals: Patient/caregiver will verbalize understanding of disease process and disease management Date Initiated: 08/25/2022 Target Resolution Date: 08/25/2022 Goal Status: Active Interventions: Assess signs and symptoms of neuropathy upon admission and as needed Provide education on Management of Neuropathy and Related Ulcers Notes: Wound/Skin Impairment Nursing Diagnoses: Impaired tissue integrity Knowledge deficit related to smoking impact on wound healing Knowledge deficit related to ulceration/compromised skin integrity Goals: Patient/caregiver will verbalize understanding of skin care regimen Date Initiated: 08/25/2022 Target Resolution Date: 08/25/2022 Goal Status: Active Ulcer/skin breakdown will have a volume reduction of 30% by week 4 Date Initiated: 08/25/2022 Date Inactivated: 11/17/2022 Target Resolution Date: 09/22/2022 Goal Status: Met Ulcer/skin breakdown will have a volume reduction of 50% by week 8 Date Initiated: 11/17/2022 Date Inactivated: 11/17/2022 Target Resolution Date: 10/20/2022 Goal Status: Met Ulcer/skin breakdown will have a volume reduction of 80% by week 12 Date Initiated: 11/17/2022 Date Inactivated: 11/17/2022 Target Resolution Date: 11/17/2022 Goal Status: Met Interventions: Assess ulceration(s) every visit Provide education on smoking NotesZALEIGH, BERMINGHAM ([id number]) [id number] Page 7 of 10 Electronic Signature(s) Signed: 12/02/2022 3:46:10 PM By: Gretta Cool, BSN, RN, CWS, Kim RN, BSN Signed: 12/03/2022 9:48:27 AM By: Massie Kluver Entered By: Massie Kluver on 12/01/2022 15:19:10 --------------------------------------------------------------------------------  Pain Assessment Details Patient Name: Date of  Service: NETTIE, WYFFELS 12/01/2022 2:15 PM Medical Record Number: [id number] Patient Account Number: [id number] Date of Birth/Sex: Treating RN: [birth year] (81 y.o. [name] Primary Care Senica Crall: [name] Other Clinician: Massie Kluver Referring Lekeith Wulf: Treating Tyeshia Cornforth/Extender: Laural Roes in Treatment: 14 Active Problems Location of Pain Severity and Description of Pain Patient Has Paino No Site Locations Pain Management and Medication Current Pain Management: Electronic Signature(s) Signed: 12/02/2022 3:46:10 PM By: Gretta Cool, BSN, RN, CWS, Kim RN, BSN Signed: 12/03/2022 9:48:27 AM By: Massie Kluver Entered By: Massie Kluver on 12/01/2022 14:46:58 Ashenfelter, Jazzma B ([id number]) [id number] Page 8 of 10 -------------------------------------------------------------------------------- Patient/Caregiver Education Details Patient Name: Date of Service: MACIE, BAUM 12/20/2023andnbsp2:15 PM Medical Record Number: [id number] Patient Account Number: [id number] Date of Birth/Gender: Treating RN: 1941-10-15 (81 y.o. [name] Primary Care Physician: [name] Other Clinician: Massie Kluver Referring Physician: Treating Physician/Extender: Laural Roes in Treatment: 14 Education Assessment Education Provided To: Patient Education Topics Provided Wound/Skin Impairment: Handouts: Other: continue wound care as directed Methods: Explain/Verbal Responses: State content correctly Electronic Signature(s) Signed: 12/03/2022 9:48:27 AM By: Massie Kluver Entered By: Massie Kluver on 12/01/2022 15:19:04 -------------------------------------------------------------------------------- Wound Assessment Details Patient Name: Date of Service: [name] 12/01/2022 2:15 PM Medical Record Number: [id number] Patient Account Number: [id number] Date of Birth/Sex: Treating  RN: 06/14/1941 (81 y.o. [name] Primary Care Lyvonne Cassell: [name] Other Clinician: Massie Kluver Referring Laquan Ludden: Treating Kenshawn Maciolek/Extender: Laural Roes in Treatment: 14 Wound Status Wound Number: 1 Primary Diabetic Wound/Ulcer of the Lower Extremity Etiology: Wound Location: Right, Medial Lower Leg Wound Open Wounding Event: Trauma Status: Date Acquired: 06/27/2022 Comorbid Hypertension, Type II Diabetes, Osteoarthritis, Neuropathy, Weeks Of Treatment: 14 History: Received Chemotherapy, Received Radiation Clustered Wound: Yes Photos Wound Measurements Length: (cm) 0.1 Henandez, Mallarie B ([id number]) Width: (cm) Depth: (cm) Area: (cm) Volume: (cm) % Reduction in Area: 99.9% [id number] Page 9 of 10 0.1 % Reduction in Volume: 99.9% 1.8 Epithelialization: None 0.008 Tunneling: Yes 0.014 Position (o'clock): 3 Maximum Distance: (cm) 1.8 Undermining: No Wound Description Classification: Grade 2 Wound Margin: Thickened Exudate Amount: Medium Exudate Type: Serosanguineous Exudate Color: red, brown Foul Odor After Cleansing: No Slough/Fibrino Yes Wound Bed Granulation Amount: Small (1-33%) Exposed Structure Granulation Quality: Red Fascia Exposed: No Necrotic Amount: Large (67-100%) Fat Layer (Subcutaneous Tissue) Exposed: Yes Necrotic Quality: Eschar, Adherent Slough Tendon Exposed: No Muscle Exposed: No Joint Exposed: No Bone Exposed: No Treatment Notes Wound #1 (Lower Leg) Wound Laterality: Right, Medial Cleanser Dakin 16 (oz) 0.25 Discharge Instruction: Use as directed. Peri-Wound Care Topical Gentamicin Discharge Instruction: Apply very small amount to hydrofera rope and place into tunnels Mupirocin Ointment Discharge Instruction: Apply very small amount to hydrofera rope and place into tunnels Primary Dressing Endoform Natural, Non-fenestrated, 2x2 (in/in) Discharge Instruction: apply tiny  amount of antibiotic ointment and lightly pack into tunnel Secondary Dressing Zetuvit Plus 4x8 (in/in) hydrofera blue rope Discharge Instruction: pack small piece into top of wound to keep open Secured With Compression Wrap Medichoice 4 layer Compression System, 35-40 mmHG Discharge Instruction: Apply multi-layer wrap as directed. Compression Stockings Environmental education officer) Signed: 12/02/2022 3:46:10 PM By: Gretta Cool, BSN, RN, CWS, Kim RN, BSN Signed: 12/03/2022 9:48:27 AM By: Massie Kluver Entered By: Massie Kluver on 12/01/2022 15:00:09 [name] ([id number]) [id number] Page 10 of 10 -------------------------------------------------------------------------------- Vitals Details Patient Name: Date of Service: BRADLEIGH, SONNEN 12/01/2022 2:15 PM Medical Record  Number: [id number] Patient Account Number: [id number] Date of Birth/Sex: Treating RN: 1941/01/13 (81 y.o. [name] Primary Care Chaun Uemura: [name] Other Clinician: Massie Kluver Referring Xylia Scherger: Treating Trixy Loyola/Extender: Laural Roes in Treatment: 14 Vital Signs Time Taken: 14:43 Temperature (F): 97.9 Weight (lbs): 172 Pulse (bpm): 78 Respiratory Rate (breaths/min): 18 Blood Pressure (mmHg): 188/72 Reference Range: 80 - 120 mg / dl Electronic Signature(s) Signed: 12/03/2022 9:48:27 AM By: Massie Kluver Entered By: Massie Kluver on 12/01/2022 14:46:54

## 2022-12-08 ENCOUNTER — Encounter: Payer: Medicare PPO | Admitting: Internal Medicine

## 2022-12-08 DIAGNOSIS — E11622 Type 2 diabetes mellitus with other skin ulcer: Secondary | ICD-10-CM | POA: Diagnosis not present

## 2022-12-15 ENCOUNTER — Encounter: Payer: Medicare PPO | Attending: Internal Medicine | Admitting: Internal Medicine

## 2022-12-15 DIAGNOSIS — T798XXA Other early complications of trauma, initial encounter: Secondary | ICD-10-CM

## 2022-12-15 DIAGNOSIS — E11622 Type 2 diabetes mellitus with other skin ulcer: Secondary | ICD-10-CM

## 2022-12-15 DIAGNOSIS — Z794 Long term (current) use of insulin: Secondary | ICD-10-CM | POA: Diagnosis not present

## 2022-12-15 DIAGNOSIS — E039 Hypothyroidism, unspecified: Secondary | ICD-10-CM | POA: Insufficient documentation

## 2022-12-15 DIAGNOSIS — I872 Venous insufficiency (chronic) (peripheral): Secondary | ICD-10-CM | POA: Insufficient documentation

## 2022-12-15 DIAGNOSIS — E1140 Type 2 diabetes mellitus with diabetic neuropathy, unspecified: Secondary | ICD-10-CM | POA: Diagnosis not present

## 2022-12-15 DIAGNOSIS — I87311 Chronic venous hypertension (idiopathic) with ulcer of right lower extremity: Secondary | ICD-10-CM | POA: Diagnosis not present

## 2022-12-15 DIAGNOSIS — L97818 Non-pressure chronic ulcer of other part of right lower leg with other specified severity: Secondary | ICD-10-CM | POA: Diagnosis not present

## 2022-12-15 DIAGNOSIS — I10 Essential (primary) hypertension: Secondary | ICD-10-CM | POA: Insufficient documentation

## 2022-12-15 NOTE — Progress Notes (Signed)
AVEENA, BARI ([id number]) [id number] Page 1 of 7 Visit Report for 12/15/2022 Chief Complaint Document Details Patient Name: Date of Service: [name], [name] 12/15/2022 2:00 PM Medical Record Number: [id number] Patient Account Number: [id number] Date of Birth/Sex: Treating RN: 1940/12/25 (82 y.o. [name] Primary Care Provider: [name] Other Clinician: Massie Kluver Referring Provider: Treating Provider/Extender: Laural Roes in Treatment: 16 Information Obtained from: Patient Chief Complaint 08/25/2022; right lower extremity wounds secondary to traumatic hematoma Electronic Signature(s) Signed: 12/15/2022 4:41:05 PM By: Kalman Shan DO Entered By: Kalman Shan on 12/15/2022 15:02:57 -------------------------------------------------------------------------------- HPI Details Patient Name: Date of Service: [name] Puna B. 12/15/2022 2:00 PM Medical Record Number: [id number] Patient Account Number: [id number] Date of Birth/Sex: Treating RN: 19-May-1941 (82 y.o. [name] Primary Care Provider: [name] Other Clinician: Massie Kluver Referring Provider: Treating Provider/Extender: Laural Roes in Treatment: 16 History of Present Illness HPI Description: Admission 08/25/2022 [name] is an 82 year old female with a past medical history of breast cancer, with insulin dependent, controlled type 2 diabetes, chronic venous insufficiency that presents to the clinic for a 9-monthhistory of nonhealing ulcer to the right lower extremity. She states she hit her leg against a stair and developed a hematoma. The area was evacuated by her primary care physician. She has tried Neosporin and wet-to-dry dressings. She reports mild chronic pain to the area. She denies increased warmth, erythema or purulent drainage. 9/20; patient presents for follow-up. She had an x-ray of the right leg  done at last clinic visit that did not show Evidence of osteomyelitis. She has been doing Dakin's wet-to-dry dressings without issues. She denies signs of infection. 9/27; patient presents for follow-up. Hydrofera Blue with antibiotic ointment was used under Kerlix/Coban at last clinic visit. She tolerated this well. She has no issues or complaints today. She denies signs of infection. 10/4; patient presents for follow-up. We have been using Hydrofera Blue to the wound bed all under Kerlix/Coban. She has no issues or complaints today. She has tolerated this well. 10/11; patient presents for follow-up. We have been using Hydrofera Blue to the wound bed under Kerlix/Coban. She has tolerated this well and has no issues or complaints. ESAAMIYA, JEPPSEN([id number] Page 2 of 7 10/18; patient presents for follow-up. We have been using Hydrofera Blue with Santyl to the wound bed under Kerlix/Coban. She has no issues or complaints today. 10/25; this is a patient with a right lower extremity medial leg ulcer in the setting of fairly severe chronic venous insufficiency. She has been using Santyl Hydrofera Blue rope Hydrofera Blue under kerlix Coban. She presents with 3 tunneling areas in the wound. 11/1; this is a patient with a difficult right lower extremity medial leg ulcer in the setting of fairly clear chronic venous hypertension, hemosiderin deposition. This was initially traumatic in the setting of a fairly clear hematoma looking at cell phone pictures today. We have been using Hydrofera Blue under compression. 11/8; patient presents for follow-up. We have been using Hydrofera Blue blue under compression therapy. She has no issues or complaints today. Snap VAC was recommended at last clinic visit and this was run through insurance. We will ask for clarification on cost. 11/15; patient presents for follow-up. We have been using Hydrofera Blue with antibiotic  ointment under compression therapy. Patient has no issues or complaints today. 11/22; patient presents for follow-up. We have been using silver alginate with antibiotic ointment under compression therapy. Patient denies  signs of infection. 11/29; patient presents for follow-up. We have been using silver alginate with antibiotic ointment under compression therapy. One of the tunnels has healed up. She has no issues or complaints today. 12/6; patient presents for follow-up. We have been using silver alginate with antibiotic ointment under compression therapy. She has no issues or complaints today. 12/13; patient presents for follow-up. We have been using silver alginate with antibiotic ointment under compression therapy. She denies signs of infection. She hit her left leg against a box. She has some mild bruising. No open wound. 12/20; patient presents for follow-up. We have been using endoform with antibiotic ointment under compression therapy. Patient has no issues or complaints today. The ecchymosis has resolved to the left lower extremity. 12/27. We have been using endoform with coating antibiotics superficial cover of Hydrofera Blue under 4-layer compression she has 2.5 cm of tunneling depth is measured by myself today. She is completing clindamycin complains that made her ill but no complaints of diarrhea. We looked over pictures on her for family's phone at the time this was done this was fairly clearly a hematoma 1/3; patient presents for follow-up. We have been using endoform with topical antibiotics and superficial culture of Hydrofera Blue under 4-layer compression. She has no issues or complaints today. Electronic Signature(s) Signed: 12/15/2022 4:41:05 PM By: Kalman Shan DO Entered By: Kalman Shan on 12/15/2022 15:03:39 -------------------------------------------------------------------------------- Physical Exam Details Patient Name: Date of Service: [name]  12/15/2022 2:00 PM Medical Record Number: [id number] Patient Account Number: [id number] Date of Birth/Sex: Treating RN: September 14, 1941 (82 y.o. [name] Primary Care Provider: [name] Other Clinician: Massie Kluver Referring Provider: Treating Provider/Extender: Laural Roes in Treatment: 16 Constitutional . Cardiovascular . Psychiatric . Notes Right lower extremity: T the medial aspect there is a small wound with depth of 1.9 cm. No signs of infection. Good edema control. Venous stasis dermatitis. o Electronic Signature(s) Signed: 12/15/2022 4:41:05 PM By: Verda Cumins ([id number]) [id number] Page 3 of 7 Entered By: Kalman Shan on 12/15/2022 15:04:31 -------------------------------------------------------------------------------- Physician Orders Details Patient Name: Date of Service: [name], [name] 12/15/2022 2:00 PM Medical Record Number: [id number] Patient Account Number: [id number] Date of Birth/Sex: Treating RN: 1941/11/15 (82 y.o. [name] Primary Care Provider: [name] Other Clinician: Massie Kluver Referring Provider: Treating Provider/Extender: Laural Roes in Treatment: 16 Verbal / Phone Orders: No Diagnosis Coding Follow-up Appointments Wound #1 Right,Medial Lower Leg Return Appointment in 1 week. Nurse Visit as needed Bathing/ Shower/ Hygiene May shower with wound dressing protected with water repellent cover or cast protector. No tub bath. Edema Control - Lymphedema / Segmental Compressive Device / Other 4 Layer Compression System Lymphedema. - right lower leg Tubigrip single layer applied. - Tubi D single left lower leg Elevate legs to the level of the heart and pump ankles as often as possible Medications-Please add to medication list. ntibiotic - Gentamycin and Mupirocin Topical A Wound Treatment Wound #1 - Lower Leg Wound  Laterality: Right, Medial Cleanser: Dakin 16 (oz) 0.25 1 x Per Week/30 Days Discharge Instructions: Use as directed. Topical: Gentamicin 1 x Per Week/30 Days Discharge Instructions: Apply very small amount to hydrofera rope and place into tunnels Topical: Mupirocin Ointment 1 x Per Week/30 Days Discharge Instructions: Apply very small amount to hydrofera rope and place into tunnels Prim Dressing: Endoform Natural, Non-fenestrated, 2x2 (in/in) 1 x Per Week/30 Days ary Discharge Instructions: apply tiny amount of antibiotic ointment  and lightly pack into tunnel Secondary Dressing: Zetuvit Plus 4x8 (in/in) 1 x Per Week/30 Days Secondary Dressing: hydrofera blue rope 1 x Per Week/30 Days Discharge Instructions: pack small piece into top of wound to keep open Compression Wrap: Medichoice 4 layer Compression System, 35-40 mmHG 1 x Per Week/30 Days Discharge Instructions: Apply multi-layer wrap as directed. Electronic Signature(s) Signed: 12/15/2022 4:41:05 PM By: Kalman Shan DO Entered By: Kalman Shan on 12/15/2022 15:12:58 Marlena Clipper B ([id number]) [id number] Page 4 of 7 -------------------------------------------------------------------------------- Problem List Details Patient Name: Date of Service: [name], [name] 12/15/2022 2:00 PM Medical Record Number: [id number] Patient Account Number: [id number] Date of Birth/Sex: Treating RN: 1941/09/13 (82 y.o. [name] Primary Care Provider: [name] Other Clinician: Massie Kluver Referring Provider: Treating Provider/Extender: Laural Roes in Treatment: 16 Active Problems ICD-10 Encounter Code Description Active Date MDM Diagnosis L97.818 Non-pressure chronic ulcer of other part of right lower leg with other specified 08/25/2022 No Yes severity T79.8XXA Other early complications of trauma, initial encounter 08/25/2022 No Yes T79.2XXA Traumatic secondary and  recurrent hemorrhage and seroma, initial encounter 08/25/2022 No Yes E11.622 Type 2 diabetes mellitus with other skin ulcer 08/25/2022 No Yes E03.9 Hypothyroidism, unspecified 08/25/2022 No Yes I10 Essential (primary) hypertension 08/25/2022 No Yes I87.311 Chronic venous hypertension (idiopathic) with ulcer of right lower extremity 08/25/2022 No Yes Inactive Problems Resolved Problems Electronic Signature(s) Signed: 12/15/2022 4:41:05 PM By: Kalman Shan DO Entered By: Kalman Shan on 12/15/2022 15:02:52 Legros, Phineas Semen B ([id number]) [id number] Page 5 of 7 -------------------------------------------------------------------------------- Progress Note Details Patient Name: Date of Service: [name], [name] 12/15/2022 2:00 PM Medical Record Number: [id number] Patient Account Number: [id number] Date of Birth/Sex: Treating RN: Feb 10, 1941 (82 y.o. [name] Primary Care Provider: [name] Other Clinician: Massie Kluver Referring Provider: Treating Provider/Extender: Laural Roes in Treatment: 16 Subjective Chief Complaint Information obtained from Patient 08/25/2022; right lower extremity wounds secondary to traumatic hematoma History of Present Illness (HPI) Admission 08/25/2022 [name] is an 82 year old female with a past medical history of breast cancer, with insulin dependent, controlled type 2 diabetes, chronic venous insufficiency that presents to the clinic for a 59-monthhistory of nonhealing ulcer to the right lower extremity. She states she hit her leg against a stair and developed a hematoma. The area was evacuated by her primary care physician. She has tried Neosporin and wet-to-dry dressings. She reports mild chronic pain to the area. She denies increased warmth, erythema or purulent drainage. 9/20; patient presents for follow-up. She had an x-ray of the right leg done at last clinic visit that did not show  Evidence of osteomyelitis. She has been doing Dakin's wet-to-dry dressings without issues. She denies signs of infection. 9/27; patient presents for follow-up. Hydrofera Blue with antibiotic ointment was used under Kerlix/Coban at last clinic visit. She tolerated this well. She has no issues or complaints today. She denies signs of infection. 10/4; patient presents for follow-up. We have been using Hydrofera Blue to the wound bed all under Kerlix/Coban. She has no issues or complaints today. She has tolerated this well. 10/11; patient presents for follow-up. We have been using Hydrofera Blue to the wound bed under Kerlix/Coban. She has tolerated this well and has no issues or complaints. 10/18; patient presents for follow-up. We have been using Hydrofera Blue with Santyl to the wound bed under Kerlix/Coban. She has no issues or complaints today. 10/25; this is a patient with a right lower extremity medial leg ulcer  in the setting of fairly severe chronic venous insufficiency. She has been using Santyl Hydrofera Blue rope Hydrofera Blue under kerlix Coban. She presents with 3 tunneling areas in the wound. 11/1; this is a patient with a difficult right lower extremity medial leg ulcer in the setting of fairly clear chronic venous hypertension, hemosiderin deposition. This was initially traumatic in the setting of a fairly clear hematoma looking at cell phone pictures today. We have been using Hydrofera Blue under compression. 11/8; patient presents for follow-up. We have been using Hydrofera Blue blue under compression therapy. She has no issues or complaints today. Snap VAC was recommended at last clinic visit and this was run through insurance. We will ask for clarification on cost. 11/15; patient presents for follow-up. We have been using Hydrofera Blue with antibiotic ointment under compression therapy. Patient has no issues or complaints today. 11/22; patient presents for follow-up. We have  been using silver alginate with antibiotic ointment under compression therapy. Patient denies signs of infection. 11/29; patient presents for follow-up. We have been using silver alginate with antibiotic ointment under compression therapy. One of the tunnels has healed up. She has no issues or complaints today. 12/6; patient presents for follow-up. We have been using silver alginate with antibiotic ointment under compression therapy. She has no issues or complaints today. 12/13; patient presents for follow-up. We have been using silver alginate with antibiotic ointment under compression therapy. She denies signs of infection. She hit her left leg against a box. She has some mild bruising. No open wound. 12/20; patient presents for follow-up. We have been using endoform with antibiotic ointment under compression therapy. Patient has no issues or complaints today. The ecchymosis has resolved to the left lower extremity. 12/27. We have been using endoform with coating antibiotics superficial cover of Hydrofera Blue under 4-layer compression she has 2.5 cm of tunneling depth is measured by myself today. She is completing clindamycin complains that made her ill but no complaints of diarrhea. We looked over pictures on her for family's phone at the time this was done this was fairly clearly a hematoma 1/3; patient presents for follow-up. We have been using endoform with topical antibiotics and superficial culture of Hydrofera Blue under 4-layer compression. She has no issues or complaints today. [name], [name] ([id number]) [id number] Page 6 of 7 Objective Constitutional Vitals Time Taken: 2:12 PM, Weight: 172 lbs, Temperature: 97.8 F, Pulse: 74 bpm, Respiratory Rate: 16 breaths/min, Blood Pressure: 151/73 mmHg. General Notes: Right lower extremity: T the medial aspect there is a small wound with depth of 1.9 cm. No signs of infection. Good edema control. Venous o stasis  dermatitis. Integumentary (Hair, Skin) Wound #1 status is Open. Original cause of wound was Trauma. The date acquired was: 06/27/2022. The wound has been in treatment 16 weeks. The wound is located on the Right,Medial Lower Leg. The wound measures 0.3cm length x 0.2cm width x 1.9cm depth; 0.047cm^2 area and 0.09cm^3 volume. There is Fat Layer (Subcutaneous Tissue) exposed. There is a medium amount of serosanguineous drainage noted. The wound margin is thickened. There is small (1-33%) red granulation within the wound bed. There is a large (67-100%) amount of necrotic tissue within the wound bed including Eschar and Adherent Slough. Assessment Active Problems ICD-10 Non-pressure chronic ulcer of other part of right lower leg with other specified severity Other early complications of trauma, initial encounter Traumatic secondary and recurrent hemorrhage and seroma, initial encounter Type 2 diabetes mellitus with other skin ulcer Hypothyroidism, unspecified Essential (  primary) hypertension Chronic venous hypertension (idiopathic) with ulcer of right lower extremity Patient's wound has shown improvement and size since last clinic visit. I recommended continuing the course with endoform and antibiotic ointment under 4- layer compression. We will keep Hydrofera Blue at the top to keep this from closing on the surface. Follow-up in 1 week. Procedures Wound #1 Pre-procedure diagnosis of Wound #1 is a Diabetic Wound/Ulcer of the Lower Extremity located on the Right,Medial Lower Leg . There was a Four Layer Compression Therapy Procedure with a pre-treatment ABI of 0.9 by Massie Kluver. Post procedure Diagnosis Wound #1: Same as Pre-Procedure Plan Follow-up Appointments: Wound #1 Right,Medial Lower Leg: Return Appointment in 1 week. Nurse Visit as needed Bathing/ Shower/ Hygiene: May shower with wound dressing protected with water repellent cover or cast protector. No tub bath. Edema Control -  Lymphedema / Segmental Compressive Device / Other: 4 Layer Compression System Lymphedema. - right lower leg Tubigrip single layer applied. - Tubi D single left lower leg Elevate legs to the level of the heart and pump ankles as often as possible Medications-Please add to medication list.: Topical Antibiotic - Gentamycin and Mupirocin WOUND #1: - Lower Leg Wound Laterality: Right, Medial Cleanser: Dakin 16 (oz) 0.25 1 x Per Week/30 Days Discharge Instructions: Use as directed. Topical: Gentamicin 1 x Per Week/30 Days Discharge Instructions: Apply very small amount to hydrofera rope and place into tunnels Topical: Mupirocin Ointment 1 x Per Week/30 Days Discharge Instructions: Apply very small amount to hydrofera rope and place into tunnels Prim Dressing: Endoform Natural, Non-fenestrated, 2x2 (in/in) 1 x Per Week/30 Days ary Discharge Instructions: apply tiny amount of antibiotic ointment and lightly pack into tunnel Secondary Dressing: Zetuvit Plus 4x8 (in/in) 1 x Per Week/30 Days Secondary Dressing: hydrofera blue rope 1 x Per Week/30 Days Discharge Instructions: pack small piece into top of wound to keep open Com pression Wrap: Medichoice 4 layer Compression System, 35-40 mmHG 1 x Per Week/30 Days Discharge Instructions: Apply multi-layer wrap as directed. [name], [name] ([id number]) [id number] Page 7 of 7 1. Endoform, hydrofera blue with antibiotic ointment under 4-layer compression. 2. Follow up in one week Electronic Signature(s) Signed: 12/15/2022 4:41:05 PM By: Kalman Shan DO Entered By: Kalman Shan on 12/15/2022 15:12:29 -------------------------------------------------------------------------------- SuperBill Details Patient Name: Date of Service: [name] 12/15/2022 Medical Record Number: [id number] Patient Account Number: [id number] Date of Birth/Sex: Treating RN: Oct 09, 1941 (81 y.o. [name] Primary Care Provider: [name] Other Clinician: Massie Kluver Referring Provider: Treating Provider/Extender: Laural Roes in Treatment: 16 Diagnosis Coding ICD-10 Codes Code Description [phone] Non-pressure chronic ulcer of other part of right lower leg with other specified severity T79.8XXA Other early complications of trauma, initial encounter T80.2XXA Traumatic secondary and recurrent hemorrhage and seroma, initial encounter E11.622 Type 2 diabetes mellitus with other skin ulcer E03.9 Hypothyroidism, unspecified I10 Essential (primary) hypertension I87.311 Chronic venous hypertension (idiopathic) with ulcer of right lower extremity Facility Procedures : CPT4 Code: [id number] Description: (Facility Use Only) [id number] - APPLY MULTLAY COMPRS LWR RT LEG ICD-10 Diagnosis Description L97.818 Non-pressure chronic ulcer of other part of right lower leg with other specified sever Modifier: ity Quantity: 1 Physician Procedures : CPT4 Code Description Modifier [id number] - WC PHYS LEVEL 3 - EST PT ICD-10 Diagnosis Description L97.818 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

## 2022-12-17 NOTE — Progress Notes (Signed)
[name], [name] ([id number]) [id number] Page 1 of 10 Visit Report for 12/15/2022 Arrival Information Details Patient Name: Date of Service: [name], [name] 12/15/2022 2:00 PM Medical Record Number: [id number] Patient Account Number: [id number] Date of Birth/Sex: Treating RN: 1941/02/07 (82 y.o. [name] Primary Care Courvoisier Hamblen: [name] Other Clinician: Massie Kluver Referring Kailoni Vahle: Treating Kandance Yano/Extender: Laural Roes in Treatment: 16 Visit Information History Since Last Visit All ordered tests and consults were completed: No Patient Arrived: Ambulatory Added or deleted any medications: No Arrival Time: 14:01 Any new allergies or adverse reactions: No Transfer Assistance: None Had a fall or experienced change in No Patient Identification Verified: Yes activities of daily living that may affect Secondary Verification Process Completed: Yes risk of falls: Patient Requires Transmission-Based Precautions: No Signs or symptoms of abuse/neglect since last visito No Patient Has Alerts: Yes Hospitalized since last visit: No Patient Alerts: Patient on Blood Thinner Implantable device outside of the clinic excluding No '81mg'$  aspirin cellular tissue based products placed in the center Type II Diabetic since last visit: Has Dressing in Place as Prescribed: Yes Has Compression in Place as Prescribed: Yes Pain Present Now: No Electronic Signature(s) Signed: 12/16/2022 5:24:26 PM By: Massie Kluver Entered By: Massie Kluver on 12/15/2022 14:12:11 -------------------------------------------------------------------------------- Clinic Level of Care Assessment Details Patient Name: Date of Service: [name], [name] 12/15/2022 2:00 PM Medical Record Number: [id number] Patient Account Number: [id number] Date of Birth/Sex: Treating RN: 1941/08/24 (82 y.o. [name] Primary Care Aneesha Holloran: [name] Other Clinician:  Massie Kluver Referring Donique Hammonds: Treating Amerah Puleo/Extender: Laural Roes in Treatment: 16 Clinic Level of Care Assessment Items TOOL 1 Quantity Score '[]'$  - 0 Use when EandM and Procedure is performed on INITIAL visit ASSESSMENTS - Nursing Assessment / Reassessment '[]'$  - 0 General Physical Exam (combine w/ comprehensive assessment (listed just below) when performed on new pt. evalsLUCILLIE, KIESEL B ([id number]) [id number] Page 2 of 10 '[]'$  - 0 Comprehensive Assessment (HX, ROS, Risk Assessments, Wounds Hx, etc.) ASSESSMENTS - Wound and Skin Assessment / Reassessment '[]'$  - 0 Dermatologic / Skin Assessment (not related to wound area) ASSESSMENTS - Ostomy and/or Continence Assessment and Care '[]'$  - 0 Incontinence Assessment and Management '[]'$  - 0 Ostomy Care Assessment and Management (repouching, etc.) PROCESS - Coordination of Care '[]'$  - 0 Simple Patient / Family Education for ongoing care '[]'$  - 0 Complex (extensive) Patient / Family Education for ongoing care '[]'$  - 0 Staff obtains Programmer, systems, Records, T Results / Process Orders est '[]'$  - 0 Staff telephones HHA, Nursing Homes / Clarify orders / etc '[]'$  - 0 Routine Transfer to another Facility (non-emergent condition) '[]'$  - 0 Routine Hospital Admission (non-emergent condition) '[]'$  - 0 New Admissions / Biomedical engineer / Ordering NPWT Apligraf, etc. , '[]'$  - 0 Emergency Hospital Admission (emergent condition) PROCESS - Special Needs '[]'$  - 0 Pediatric / Minor Patient Management '[]'$  - 0 Isolation Patient Management '[]'$  - 0 Hearing / Language / Visual special needs '[]'$  - 0 Assessment of Community assistance (transportation, D/C planning, etc.) '[]'$  - 0 Additional assistance / Altered mentation '[]'$  - 0 Support Surface(s) Assessment (bed, cushion, seat, etc.) INTERVENTIONS - Miscellaneous '[]'$  - 0 External ear exam '[]'$  - 0 Patient Transfer (multiple staff / Civil Service fast streamer / Similar  devices) '[]'$  - 0 Simple Staple / Suture removal (25 or less) '[]'$  - 0 Complex Staple / Suture removal (26 or more) '[]'$  - 0 Hypo/Hyperglycemic Management (do not check if billed separately) '[]'$  -  0 Ankle / Brachial Index (ABI) - do not check if billed separately Has the patient been seen at the hospital within the last three years: Yes Total Score: 0 Level Of Care: ____ Electronic Signature(s) Signed: 12/16/2022 5:24:26 PM By: Massie Kluver Entered By: Massie Kluver on 12/15/2022 14:36:20 -------------------------------------------------------------------------------- Compression Therapy Details Patient Name: Date of Service: [name] Puna B. 12/15/2022 2:00 PM Medical Record Number: [id number] Patient Account Number: [id number] Date of Birth/Sex: Treating RN: 11-28-1941 (82 y.o. [name] Primary Care Tadd Holtmeyer: [name] Other Clinician: Eleaner, Dibartolo B ([id number]) [phone].pdf Page 3 of 10 Referring Joaovictor Krone: Treating Iesha Summerhill/Extender: Laural Roes in Treatment: 16 Compression Therapy Performed for Wound Assessment: Wound #1 Right,Medial Lower Leg Performed By: Lenice Pressman, [name], Compression Type: Four Layer Pre Treatment ABI: 0.9 Post Procedure Diagnosis Same as Pre-procedure Electronic Signature(s) Signed: 12/16/2022 5:24:26 PM By: Massie Kluver Entered By: Massie Kluver on 12/15/2022 14:35:58 -------------------------------------------------------------------------------- Encounter Discharge Information Details Patient Name: Date of Service: [name] Puna B. 12/15/2022 2:00 PM Medical Record Number: [id number] Patient Account Number: [id number] Date of Birth/Sex: Treating RN: May 31, 1941 (82 y.o. [name] Primary Care Duell Holdren: [name] Other Clinician: Massie Kluver Referring Nikiesha Milford: Treating Donasia Wimes/Extender: Laural Roes in Treatment: 16 Encounter  Discharge Information Items Discharge Condition: Stable Ambulatory Status: Ambulatory Discharge Destination: Home Transportation: Private Auto Accompanied By: daughter Schedule Follow-up Appointment: Yes Clinical Summary of Care: Electronic Signature(s) Signed: 12/16/2022 5:24:26 PM By: Massie Kluver Entered By: Massie Kluver on 12/15/2022 14:58:30 -------------------------------------------------------------------------------- Lower Extremity Assessment Details Patient Name: Date of Service: [name], [name] 12/15/2022 2:00 PM Medical Record Number: [id number] Patient Account Number: [id number] Date of Birth/Sex: Treating RN: 1941/11/29 (82 y.o. [name] Primary Care Toma Arts: [name] Other Clinician: Massie Kluver Referring Berenize Gatlin: Treating Farron Lafond/Extender: Laural Roes in Treatment: [address], [place] ([id number]) [id number] Page 4 of 10 Edema Assessment Assessed: [Left: No] [Right: Yes] Edema: [Left: Ye] [Right: s] Calf Left: Right: Point of Measurement: 30 cm From Medial Instep 33.5 cm Ankle Left: Right: Point of Measurement: 10 cm From Medial Instep 21 cm Vascular Assessment Pulses: Dorsalis Pedis Palpable: [Right:Yes] Electronic Signature(s) Signed: 12/15/2022 4:52:31 PM By: Gretta Cool, BSN, RN, CWS, Kim RN, BSN Signed: 12/16/2022 5:24:26 PM By: Massie Kluver Entered By: Massie Kluver on 12/15/2022 14:28:16 -------------------------------------------------------------------------------- Multi Wound Chart Details Patient Name: Date of Service: [name] Puna B. 12/15/2022 2:00 PM Medical Record Number: [id number] Patient Account Number: [id number] Date of Birth/Sex: Treating RN: 08-10-1941 (82 y.o. [name] Primary Care Gerod Caligiuri: [name] Other Clinician: Massie Kluver Referring Shamariah Shewmake: Treating Chante Mayson/Extender: Laural Roes in Treatment: 16 Vital  Signs Height(in): Pulse(bpm): 74 Weight(lbs): 172 Blood Pressure(mmHg): 151/73 Body Mass Index(BMI): Temperature(F): 97.8 Respiratory Rate(breaths/min): 16 [1:Photos:] [N/A:N/A] Right, Medial Lower Leg N/A N/A Wound Location: Trauma N/A N/A Wounding Event: Diabetic Wound/Ulcer of the Lower N/A N/A Primary Etiology: Extremity Hypertension, Type II Diabetes, N/A N/A Comorbid History: Osteoarthritis, Neuropathy, Received Chemotherapy, Received Radiation 06/27/2022 N/A N/A Date AcquiredDELPHA, PERKO ([id number]) [id number] Page 5 of 10 16 N/A N/A Weeks of Treatment: Open N/A N/A Wound Status: No N/A N/A Wound Recurrence: Yes N/A N/A Clustered Wound: 0.3x0.2x1.9 N/A N/A Measurements L x W x D (cm) 0.047 N/A N/A A (cm) : rea 0.09 N/A N/A Volume (cm) : 99.30% N/A N/A % Reduction in A rea: 99.10% N/A N/A % Reduction in Volume: Grade 2 N/A N/A Classification: Medium N/A N/A Exudate A mount:  Serosanguineous N/A N/A Exudate Type: red, brown N/A N/A Exudate Color: Thickened N/A N/A Wound Margin: Small (1-33%) N/A N/A Granulation A mount: Red N/A N/A Granulation Quality: Large (67-100%) N/A N/A Necrotic A mount: Eschar, Adherent Slough N/A N/A Necrotic Tissue: Fat Layer (Subcutaneous Tissue): Yes N/A N/A Exposed Structures: Fascia: No Tendon: No Muscle: No Joint: No Bone: No None N/A N/A Epithelialization: Treatment Notes Electronic Signature(s) Signed: 12/16/2022 5:24:26 PM By: Massie Kluver Entered By: Massie Kluver on 12/15/2022 14:28:32 -------------------------------------------------------------------------------- Multi-Disciplinary Care Plan Details Patient Name: Date of Service: [name] Puna B. 12/15/2022 2:00 PM Medical Record Number: [id number] Patient Account Number: [id number] Date of Birth/Sex: Treating RN: Mar 18, 1941 (82 y.o. [name] Primary Care Turon Kilmer: [name] Other Clinician: Massie Kluver Referring Tanganika Barradas: Treating Leeann Bady/Extender: Laural Roes in Treatment: 16 Active Inactive Necrotic Tissue Nursing Diagnoses: Impaired tissue integrity related to necrotic/devitalized tissue Knowledge deficit related to management of necrotic/devitalized tissue Goals: Necrotic/devitalized tissue will be minimized in the wound bed Date Initiated: 08/25/2022 Target Resolution Date: 08/25/2022 Goal Status: Active Patient/caregiver will verbalize understanding of reason and process for debridement of necrotic tissue Date Initiated: 08/25/2022 Target Resolution Date: 08/25/2022 Goal Status: Active Interventions: Assess patient pain level pre-, during and post procedure and prior to discharge Provide education on necrotic tissue and debridement process Treatment Activities: [name], [name] ([id number]) [id number] Page 6 of 10 Excisional debridement : 08/25/2022 Notes: Pain, Acute or Chronic Nursing Diagnoses: Pain Management - Cyclic Acute (Dressing Change Related) Pain, acute or chronic: actual or potential Potential alteration in comfort, pain Goals: Patient will verbalize adequate pain control and receive pain control interventions during procedures as needed Date Initiated: 08/25/2022 Target Resolution Date: 08/25/2022 Goal Status: Active Patient/caregiver will verbalize adequate pain control between visits Date Initiated: 08/25/2022 Target Resolution Date: 08/25/2022 Goal Status: Active Patient/caregiver will verbalize comfort level met Date Initiated: 08/25/2022 Target Resolution Date: 08/25/2022 Goal Status: Active Interventions: Provide education on pain management Reposition patient for comfort Treatment Activities: Administer pain control measures as ordered : 08/25/2022 Notes: Peripheral Neuropathy Nursing Diagnoses: Knowledge deficit related to disease process and management of peripheral neurovascular  dysfunction Potential alteration in peripheral tissue perfusion (select prior to confirmation of diagnosis) Goals: Patient/caregiver will verbalize understanding of disease process and disease management Date Initiated: 08/25/2022 Target Resolution Date: 08/25/2022 Goal Status: Active Interventions: Assess signs and symptoms of neuropathy upon admission and as needed Provide education on Management of Neuropathy and Related Ulcers Notes: Wound/Skin Impairment Nursing Diagnoses: Impaired tissue integrity Knowledge deficit related to smoking impact on wound healing Knowledge deficit related to ulceration/compromised skin integrity Goals: Patient/caregiver will verbalize understanding of skin care regimen Date Initiated: 08/25/2022 Target Resolution Date: 08/25/2022 Goal Status: Active Ulcer/skin breakdown will have a volume reduction of 30% by week 4 Date Initiated: 08/25/2022 Date Inactivated: 11/17/2022 Target Resolution Date: 09/22/2022 Goal Status: Met Ulcer/skin breakdown will have a volume reduction of 50% by week 8 Date Initiated: 11/17/2022 Date Inactivated: 11/17/2022 Target Resolution Date: 10/20/2022 Goal Status: Met Ulcer/skin breakdown will have a volume reduction of 80% by week 12 Date Initiated: 11/17/2022 Date Inactivated: 11/17/2022 Target Resolution Date: 11/17/2022 Goal Status: Met Interventions: Assess ulceration(s) every visit Provide education on smoking Notes: Electronic Signature(s) Signed: 12/15/2022 4:52:31 PM By: Gretta Cool, BSN, RN, CWS, Kim RN, BSN Quail, Alsea B ([id number]) [phone].pdf Page 7 of 10 Signed: 12/16/2022 5:24:26 PM By: Massie Kluver Entered By: Massie Kluver on 12/15/2022 14:57:56 -------------------------------------------------------------------------------- Pain Assessment Details Patient Name: Date of Service: [name] Puna B. 12/15/2022  2:00 PM Medical Record Number: [id number] Patient Account Number:  [id number] Date of Birth/Sex: Treating RN: 15-Nov-1941 (82 y.o. [name] Primary Care Klani Caridi: [name] Other Clinician: Massie Kluver Referring Mande Auvil: Treating Skyler Carel/Extender: Laural Roes in Treatment: 16 Active Problems Location of Pain Severity and Description of Pain Patient Has Paino No Site Locations Pain Management and Medication Current Pain Management: Electronic Signature(s) Signed: 12/15/2022 4:52:31 PM By: Gretta Cool, BSN, RN, CWS, Kim RN, BSN Signed: 12/16/2022 5:24:26 PM By: Massie Kluver Entered By: Massie Kluver on 12/15/2022 14:18:08 -------------------------------------------------------------------------------- Patient/Caregiver Education Details Patient Name: Date of Service: [name] 1/3/2024andnbsp2:00 PM Medical Record Number: [id number] Patient Account Number: [id number] [name], [name] ([id number]) [id number] Page 8 of 10 Date of Birth/Gender: Treating RN: 31-May-1941 (82 y.o. [name] Primary Care Physician: [name] Other Clinician: Massie Kluver Referring Physician: Treating Physician/Extender: Laural Roes in Treatment: 16 Education Assessment Education Provided To: Patient Education Topics Provided Wound/Skin Impairment: Handouts: Other: continue wound care as directed Methods: Explain/Verbal Responses: State content correctly Electronic Signature(s) Signed: 12/16/2022 5:24:26 PM By: Massie Kluver Entered By: Massie Kluver on 12/15/2022 14:57:51 -------------------------------------------------------------------------------- Wound Assessment Details Patient Name: Date of Service: [name] 12/15/2022 2:00 PM Medical Record Number: [id number] Patient Account Number: [id number] Date of Birth/Sex: Treating RN: 1941-01-25 (82 y.o. [name] Primary Care [place] Lesure: [name] Other Clinician: Massie Kluver Referring  Kalel Harty: Treating Maximilien Hayashi/Extender: Laural Roes in Treatment: 16 Wound Status Wound Number: 1 Primary Diabetic Wound/Ulcer of the Lower Extremity Etiology: Wound Location: Right, Medial Lower Leg Wound Open Wounding Event: Trauma Status: Date Acquired: 06/27/2022 Comorbid Hypertension, Type II Diabetes, Osteoarthritis, Neuropathy, Weeks Of Treatment: 16 History: Received Chemotherapy, Received Radiation Clustered Wound: Yes Photos Wound Measurements Length: (cm) 0.3 Width: (cm) 0.2 Depth: (cm) 1.9 Area: (cm) 0. Volume: (cm) 0. [name], [name] B ([id number]) % Reduction in Area: 99.3% % Reduction in Volume: 99.1% Epithelialization: None 047 09 [id number] Page 9 of 10 Wound Description Classification: Grade 2 Wound Margin: Thickened Exudate Amount: Medium Exudate Type: Serosanguineous Exudate Color: red, brown Foul Odor After Cleansing: No Slough/Fibrino Yes Wound Bed Granulation Amount: Small (1-33%) Exposed Structure Granulation Quality: Red Fascia Exposed: No Necrotic Amount: Large (67-100%) Fat Layer (Subcutaneous Tissue) Exposed: Yes Necrotic Quality: Eschar, Adherent Slough Tendon Exposed: No Muscle Exposed: No Joint Exposed: No Bone Exposed: No Treatment Notes Wound #1 (Lower Leg) Wound Laterality: Right, Medial Cleanser Dakin 16 (oz) 0.25 Discharge Instruction: Use as directed. Peri-Wound Care Topical Gentamicin Discharge Instruction: Apply very small amount to hydrofera rope and place into tunnels Mupirocin Ointment Discharge Instruction: Apply very small amount to hydrofera rope and place into tunnels Primary Dressing Endoform Natural, Non-fenestrated, 2x2 (in/in) Discharge Instruction: apply tiny amount of antibiotic ointment and lightly pack into tunnel Secondary Dressing Zetuvit Plus 4x8 (in/in) hydrofera blue rope Discharge Instruction: pack small piece into top of wound to keep  open Secured With Compression Wrap Medichoice 4 layer Compression System, 35-40 mmHG Discharge Instruction: Apply multi-layer wrap as directed. Compression Stockings Environmental education officer) Signed: 12/15/2022 4:52:31 PM By: Gretta Cool, BSN, RN, CWS, Kim RN, BSN Signed: 12/16/2022 5:24:26 PM By: Massie Kluver Entered By: Massie Kluver on 12/15/2022 14:27:13 -------------------------------------------------------------------------------- [place] Details Patient Name: Date of Service: [name] Puna B. 12/15/2022 2:00 PM Medical Record Number: [id number] Patient Account Number: [id number] Date of Birth/Sex: Treating RN: August 24, 1941 (82 y.o. [name] [place], [place] ([id number]) [id number] Page 10 of 10 Primary Care Meet [place]: [name]  Other Clinician: Massie Kluver Referring Sheika Coutts: Treating Skylee Baird/Extender: Laural Roes in Treatment: 16 Vital Signs Time Taken: 14:12 Temperature (F): 97.8 Weight (lbs): 172 Pulse (bpm): 74 Respiratory Rate (breaths/min): 16 Blood Pressure (mmHg): 151/73 Reference Range: 80 - 120 mg / dl Electronic Signature(s) Signed: 12/16/2022 5:24:26 PM By: Massie Kluver Entered By: Massie Kluver on 12/15/2022 14:17:18

## 2022-12-21 NOTE — Progress Notes (Signed)
SYANN, CUPPLES ([id number]) [id number] Page 1 of 7 Visit Report for 12/08/2022 HPI Details Patient Name: Date of Service: [name], [name] 12/08/2022 2:15 PM Medical Record Number: [id number] Patient Account Number: [id number] Date of Birth/Sex: Treating RN: 16-Sep-1941 (82 y.o. Orvan Falconer Primary Care Provider: [name] Other Clinician: Massie Kluver Referring Provider: Treating Provider/Extender: Eldridge Dace, [name] in Treatment: 15 History of Present Illness HPI Description: Admission 08/25/2022 [name] is an 82 year old female with a past medical history of breast cancer, with insulin dependent, controlled type 2 diabetes, chronic venous insufficiency that presents to the clinic for a 51-monthhistory of nonhealing ulcer to the right lower extremity. She states she hit her leg against a stair and developed a hematoma. The area was evacuated by her primary care physician. She has tried Neosporin and wet-to-dry dressings. She reports mild chronic pain to the area. She denies increased warmth, erythema or purulent drainage. 9/20; patient presents for follow-up. She had an x-ray of the right leg done at last clinic visit that did not show Evidence of osteomyelitis. She has been doing Dakin's wet-to-dry dressings without issues. She denies signs of infection. 9/27; patient presents for follow-up. Hydrofera Blue with antibiotic ointment was used under Kerlix/Coban at last clinic visit. She tolerated this well. She has no issues or complaints today. She denies signs of infection. 10/4; patient presents for follow-up. We have been using Hydrofera Blue to the wound bed all under Kerlix/Coban. She has no issues or complaints today. She has tolerated this well. 10/11; patient presents for follow-up. We have been using Hydrofera Blue to the wound bed under Kerlix/Coban. She has tolerated this well and has no issues or  complaints. 10/18; patient presents for follow-up. We have been using Hydrofera Blue with Santyl to the wound bed under Kerlix/Coban. She has no issues or complaints today. 10/25; this is a patient with a right lower extremity medial leg ulcer in the setting of fairly severe chronic venous insufficiency. She has been using Santyl Hydrofera Blue rope Hydrofera Blue under kerlix Coban. She presents with 3 tunneling areas in the wound. 11/1; this is a patient with a difficult right lower extremity medial leg ulcer in the setting of fairly clear chronic venous hypertension, hemosiderin deposition. This was initially traumatic in the setting of a fairly clear hematoma looking at cell phone pictures today. We have been using Hydrofera Blue under compression. 11/8; patient presents for follow-up. We have been using Hydrofera Blue blue under compression therapy. She has no issues or complaints today. Snap VAC was recommended at last clinic visit and this was run through insurance. We will ask for clarification on cost. 11/15; patient presents for follow-up. We have been using Hydrofera Blue with antibiotic ointment under compression therapy. Patient has no issues or complaints today. 11/22; patient presents for follow-up. We have been using silver alginate with antibiotic ointment under compression therapy. Patient denies signs of infection. 11/29; patient presents for follow-up. We have been using silver alginate with antibiotic ointment under compression therapy. One of the tunnels has healed up. She has no issues or complaints today. 12/6; patient presents for follow-up. We have been using silver alginate with antibiotic ointment under compression therapy. She has no issues or complaints today. 12/13; patient presents for follow-up. We have been using silver alginate with antibiotic ointment under compression therapy. She denies signs of infection. She hit her left leg against a box. She has some mild  bruising. No  open wound. 12/20; patient presents for follow-up. We have been using endoform with antibiotic ointment under compression therapy. Patient has no issues or complaints today. The ecchymosis has resolved to the left lower extremity. 12/27. We have been using endoform with coating antibiotics superficial cover of Hydrofera Blue under 4-layer compression she has 2.5 cm of tunneling depth is measured by myself today. She is completing clindamycin complains that made her ill but no complaints of diarrhea. We looked over pictures on her for family's phone at the time this was done this was fairly clearly a hematoma Electronic Signature(s) Signed: 12/08/2022 4:44:55 PM By: Linton Ham MD Entered By: Linton Ham on 12/08/2022 15:14:57 [name] ([id number]) [id number] Page 2 of 7 -------------------------------------------------------------------------------- Physical Exam Details Patient Name: Date of Service: [name], [name] 12/08/2022 2:15 PM Medical Record Number: [id number] Patient Account Number: [id number] Date of Birth/Sex: Treating RN: Aug 29, 1941 (82 y.o. Orvan Falconer Primary Care Provider: [name] Other Clinician: Massie Kluver Referring Provider: Treating Provider/Extender: Eldridge Dace, [name] in Treatment: 15 Constitutional Patient is hypertensive.. Pulse regular and within target range for patient.Marland Kitchen Respirations regular, non-labored and within target range.. Temperature is normal and within the target range for the patient.Marland Kitchen appears in no distress. Cardiovascular Pedal pulses are palpable. Good edema control.. Notes Wound exam; right medial lower extremity. Very tiny orifice to this wound however it is 2.5 cm of probing depth. No overt evidence of infection there is no purulent drainage and no surrounding tenderness or edema control is quite good Electronic Signature(s) Signed: 12/08/2022  4:44:55 PM By: Linton Ham MD Entered By: Linton Ham on 12/08/2022 15:18:52 -------------------------------------------------------------------------------- Physician Orders Details Patient Name: Date of Service: [name] 12/08/2022 2:15 PM Medical Record Number: [id number] Patient Account Number: [id number] Date of Birth/Sex: Treating RN: [birth year] (82 y.o. Orvan Falconer Primary Care Provider: [name] Other Clinician: Massie Kluver Referring Provider: Treating Provider/Extender: Eldridge Dace, [name] in Treatment: 15 Verbal / Phone Orders: No Diagnosis Coding Follow-up Appointments Wound #1 Right,Medial Lower Leg Return Appointment in 1 week. Nurse Visit as needed Bathing/ Shower/ Hygiene May shower with wound dressing protected with water repellent cover or cast protector. No tub bath. Edema Control - Lymphedema / Segmental Compressive Device / FARDOWSA, AUTHIER B ([id number]) [id number] Page 3 of 7 4 Layer Compression System Lymphedema. - right lower leg Tubigrip single layer applied. - Tubi D single left lower leg Elevate legs to the level of the heart and pump ankles as often as possible Medications-Please add to medication list. ntibiotics - start clindamycin P.O. A ntibiotic - Gentamycin and Mupirocin Topical A Wound Treatment Wound #1 - Lower Leg Wound Laterality: Right, Medial Cleanser: Dakin 16 (oz) 0.25 1 x Per Week/30 Days Discharge Instructions: Use as directed. Topical: Gentamicin 1 x Per Week/30 Days Discharge Instructions: Apply very small amount to hydrofera rope and place into tunnels Topical: Mupirocin Ointment 1 x Per Week/30 Days Discharge Instructions: Apply very small amount to hydrofera rope and place into tunnels Prim Dressing: Endoform Natural, Non-fenestrated, 2x2 (in/in) 1 x Per Week/30 Days ary Discharge Instructions: apply tiny amount of antibiotic ointment and  lightly pack into tunnel Secondary Dressing: Zetuvit Plus 4x8 (in/in) 1 x Per Week/30 Days Secondary Dressing: hydrofera blue rope 1 x Per Week/30 Days Discharge Instructions: pack small piece into top of wound to keep open Compression Wrap: Medichoice 4 layer Compression System, 35-40 mmHG 1 x Per Week/30 Days  Discharge Instructions: Apply multi-layer wrap as directed. Electronic Signature(s) Signed: 12/08/2022 4:44:55 PM By: Linton Ham MD Signed: 12/21/2022 10:27:37 AM By: Massie Kluver Entered By: Massie Kluver on 12/08/2022 14:52:11 -------------------------------------------------------------------------------- Problem List Details Patient Name: Date of Service: [name] 12/08/2022 2:15 PM Medical Record Number: [id number] Patient Account Number: [id number] Date of Birth/Sex: Treating RN: 1940/12/26 (81 y.o. Orvan Falconer Primary Care Provider: [name] Other Clinician: Massie Kluver Referring Provider: Treating Provider/Extender: Eldridge Dace, [name] in Treatment: 15 Active Problems ICD-10 Encounter Code Description Active Date MDM Diagnosis L97.818 Non-pressure chronic ulcer of other part of right lower leg with other specified 08/25/2022 No Yes severity T79.8XXA Other early complications of trauma, initial encounter 08/25/2022 No Yes T79.2XXA Traumatic secondary and recurrent hemorrhage and seroma, initial encounter 08/25/2022 No Yes SLOAN, GALENTINE B ([id number]) [id number] Page 4 of 7 E11.622 Type 2 diabetes mellitus with other skin ulcer 08/25/2022 No Yes E03.9 Hypothyroidism, unspecified 08/25/2022 No Yes I10 Essential (primary) hypertension 08/25/2022 No Yes I87.311 Chronic venous hypertension (idiopathic) with ulcer of right lower extremity 08/25/2022 No Yes Inactive Problems Resolved Problems Electronic Signature(s) Signed: 12/08/2022 4:44:55 PM By: Linton Ham MD Entered By: Linton Ham on  12/08/2022 15:13:03 -------------------------------------------------------------------------------- Progress Note Details Patient Name: Date of Service: [name] 12/08/2022 2:15 PM Medical Record Number: [id number] Patient Account Number: [id number] Date of Birth/Sex: Treating RN: December 26, 1940 (82 y.o. Orvan Falconer Primary Care Provider: [name] Other Clinician: Massie Kluver Referring Provider: Treating Provider/Extender: Eldridge Dace, [name] in Treatment: 15 Subjective History of Present Illness (HPI) Admission 08/25/2022 Ms. Jaylenn Baiza is an 82 year old female with a past medical history of breast cancer, with insulin dependent, controlled type 2 diabetes, chronic venous insufficiency that presents to the clinic for a 73-monthhistory of nonhealing ulcer to the right lower extremity. She states she hit her leg against a stair and developed a hematoma. The area was evacuated by her primary care physician. She has tried Neosporin and wet-to-dry dressings. She reports mild chronic pain to the area. She denies increased warmth, erythema or purulent drainage. 9/20; patient presents for follow-up. She had an x-ray of the right leg done at last clinic visit that did not show Evidence of osteomyelitis. She has been doing Dakin's wet-to-dry dressings without issues. She denies signs of infection. 9/27; patient presents for follow-up. Hydrofera Blue with antibiotic ointment was used under Kerlix/Coban at last clinic visit. She tolerated this well. She has no issues or complaints today. She denies signs of infection. 10/4; patient presents for follow-up. We have been using Hydrofera Blue to the wound bed all under Kerlix/Coban. She has no issues or complaints today. She has tolerated this well. 10/11; patient presents for follow-up. We have been using Hydrofera Blue to the wound bed under Kerlix/Coban. She has tolerated this well and has no issues or  complaints. 10/18; patient presents for follow-up. We have been using Hydrofera Blue with Santyl to the wound bed under Kerlix/Coban. She has no issues or complaints today. 10/25; this is a patient with a right lower extremity medial leg ulcer in the setting of fairly severe chronic venous insufficiency. She has been using Santyl Hydrofera Blue rope Hydrofera Blue under kerlix Coban. She presents with 3 tunneling areas in the wound. 11/1; this is a patient with a difficult right lower extremity medial leg ulcer in the setting of fairly clear chronic venous hypertension, hemosiderin deposition. This was initially traumatic in  the setting of a fairly clear hematoma looking at cell phone pictures today. We have been using Hydrofera Blue under compression. JESSAH, DANSER ([id number]) [id number] Page 5 of 7 11/8; patient presents for follow-up. We have been using Hydrofera Blue blue under compression therapy. She has no issues or complaints today. Snap VAC was recommended at last clinic visit and this was run through insurance. We will ask for clarification on cost. 11/15; patient presents for follow-up. We have been using Hydrofera Blue with antibiotic ointment under compression therapy. Patient has no issues or complaints today. 11/22; patient presents for follow-up. We have been using silver alginate with antibiotic ointment under compression therapy. Patient denies signs of infection. 11/29; patient presents for follow-up. We have been using silver alginate with antibiotic ointment under compression therapy. One of the tunnels has healed up. She has no issues or complaints today. 12/6; patient presents for follow-up. We have been using silver alginate with antibiotic ointment under compression therapy. She has no issues or complaints today. 12/13; patient presents for follow-up. We have been using silver alginate with antibiotic ointment under compression therapy. She  denies signs of infection. She hit her left leg against a box. She has some mild bruising. No open wound. 12/20; patient presents for follow-up. We have been using endoform with antibiotic ointment under compression therapy. Patient has no issues or complaints today. The ecchymosis has resolved to the left lower extremity. 12/27. We have been using endoform with coating antibiotics superficial cover of Hydrofera Blue under 4-layer compression she has 2.5 cm of tunneling depth is measured by myself today. She is completing clindamycin complains that made her ill but no complaints of diarrhea. We looked over pictures on her for family's phone at the time this was done this was fairly clearly a hematoma Objective Constitutional Patient is hypertensive.. Pulse regular and within target range for patient.Marland Kitchen Respirations regular, non-labored and within target range.. Temperature is normal and within the target range for the patient.Marland Kitchen appears in no distress. Vitals Time Taken: 2:22 PM, Weight: 172 lbs, Temperature: 97.8 F, Pulse: 75 bpm, Respiratory Rate: 18 breaths/min, Blood Pressure: 180/66 mmHg. Cardiovascular Pedal pulses are palpable. Good edema control.. General Notes: Wound exam; right medial lower extremity. Very tiny orifice to this wound however it is 2.5 cm of probing depth. No overt evidence of infection there is no purulent drainage and no surrounding tenderness or edema control is quite good Integumentary (Hair, Skin) Wound #1 status is Open. Original cause of wound was Trauma. The date acquired was: 06/27/2022. The wound has been in treatment 15 weeks. The wound is located on the Right,Medial Lower Leg. The wound measures 0.4cm length x 0.2cm width x 2.1cm depth; 0.063cm^2 area and 0.132cm^3 volume. There is Fat Layer (Subcutaneous Tissue) exposed. There is no undermining noted, however, there is tunneling at 3:00 with a maximum distance of 2.1cm. There is a medium amount of  serosanguineous drainage noted. The wound margin is thickened. There is small (1-33%) red granulation within the wound bed. There is a large (67-100%) amount of necrotic tissue within the wound bed including Eschar and Adherent Slough. Assessment Active Problems ICD-10 Non-pressure chronic ulcer of other part of right lower leg with other specified severity Other early complications of trauma, initial encounter Traumatic secondary and recurrent hemorrhage and seroma, initial encounter Type 2 diabetes mellitus with other skin ulcer Hypothyroidism, unspecified Essential (primary) hypertension Chronic venous hypertension (idiopathic) with ulcer of right lower extremity Procedures Wound #1 Pre-procedure diagnosis of Wound #1  is a Diabetic Wound/Ulcer of the Lower Extremity located on the Right,Medial Lower Leg . There was a Four Layer Compression Therapy Procedure with a pre-treatment ABI of 0.9 by Massie Kluver. Post procedure Diagnosis Wound #1: Same as Pre-Procedure OMIE, FERGER B ([id number]) [id number] Page 6 of 7 Plan Follow-up Appointments: Wound #1 Right,Medial Lower Leg: Return Appointment in 1 week. Nurse Visit as needed Bathing/ Shower/ Hygiene: May shower with wound dressing protected with water repellent cover or cast protector. No tub bath. Edema Control - Lymphedema / Segmental Compressive Device / Other: 4 Layer Compression System Lymphedema. - right lower leg Tubigrip single layer applied. - Tubi D single left lower leg Elevate legs to the level of the heart and pump ankles as often as possible Medications-Please add to medication list.: P.O. Antibiotics - start clindamycin Topical Antibiotic - Gentamycin and Mupirocin WOUND #1: - Lower Leg Wound Laterality: Right, Medial Cleanser: Dakin 16 (oz) 0.25 1 x Per Week/30 Days Discharge Instructions: Use as directed. Topical: Gentamicin 1 x Per Week/30 Days Discharge Instructions: Apply very  small amount to hydrofera rope and place into tunnels Topical: Mupirocin Ointment 1 x Per Week/30 Days Discharge Instructions: Apply very small amount to hydrofera rope and place into tunnels Prim Dressing: Endoform Natural, Non-fenestrated, 2x2 (in/in) 1 x Per Week/30 Days ary Discharge Instructions: apply tiny amount of antibiotic ointment and lightly pack into tunnel Secondary Dressing: Zetuvit Plus 4x8 (in/in) 1 x Per Week/30 Days Secondary Dressing: hydrofera blue rope 1 x Per Week/30 Days Discharge Instructions: pack small piece into top of wound to keep open Com pression Wrap: Medichoice 4 layer Compression System, 35-40 mmHG 1 x Per Week/30 Days Discharge Instructions: Apply multi-layer wrap as directed. 1. This was initially a traumatic hematoma judging by the picture family showed me today 2. Now a very small wound but with 2.5 cm of depth. With a skinny I cannot prove any undermining at the base 3. No purulent drainage. She can can finish the antibiotics. 4. I did not change the primary dressing Electronic Signature(s) Signed: 12/08/2022 4:44:55 PM By: Linton Ham MD Entered By: Linton Ham on 12/08/2022 15:19:52 -------------------------------------------------------------------------------- SuperBill Details Patient Name: Date of Service: [name] 12/08/2022 Medical Record Number: [id number] Patient Account Number: [id number] Date of Birth/Sex: Treating RN: 02-17-1941 (81 y.o. Orvan Falconer Primary Care Provider: [name] Other Clinician: Massie Kluver Referring Provider: Treating Provider/Extender: Eldridge Dace, [name] in Treatment: 15 Diagnosis Coding ICD-10 Codes Code Description [phone] Non-pressure chronic ulcer of other part of right lower leg with other specified severity T79.8XXA Other early complications of trauma, initial encounter T20.2XXA Traumatic secondary and recurrent hemorrhage and seroma, initial  encounter E11.622 Type 2 diabetes mellitus with other skin ulcer E03.9 Hypothyroidism, unspecified MIREL, HUNDAL B ([id number]) [id number] Page 7 of 7 I10 Essential (primary) hypertension I87.311 Chronic venous hypertension (idiopathic) with ulcer of right lower extremity Facility Procedures : CPT4 Code: [id number] Description: (Facility Use Only) [phone] - APPLY MULTLAY COMPRS LWR RT LEG ICD-10 Diagnosis Description L97.818 Non-pressure chronic ulcer of other part of right lower leg with other specified sever Modifier: ity Quantity: 1 Physician Procedures : CPT4 Code Description Modifier [id number] - WC PHYS LEVEL 3 - EST PT ICD-10 Diagnosis Description L97.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 

## 2022-12-21 NOTE — Progress Notes (Signed)
[name], [name] ([id number]) [id number] Page 1 of 9 Visit Report for 12/08/2022 Arrival Information Details Patient Name: Date of Service: [name], [name] 12/08/2022 2:15 PM Medical Record Number: [id number] Patient Account Number: [id number] Date of Birth/Sex: Treating RN: [birth year] (82 y.o. Orvan Falconer Primary Care Petr Bontempo: [name] Other Clinician: Massie Kluver Referring Addaline Peplinski: Treating Oriel Rumbold/Extender: Eldridge Dace, [name] in Treatment: 15 Visit Information History Since Last Visit All ordered tests and consults were completed: No Patient Arrived: Ambulatory Added or deleted any medications: No Arrival Time: 14:21 Any new allergies or adverse reactions: No Transfer Assistance: None Had a fall or experienced change in No Patient Identification Verified: Yes activities of daily living that may affect Secondary Verification Process Completed: Yes risk of falls: Patient Requires Transmission-Based Precautions: No Signs or symptoms of abuse/neglect since last visito No Patient Has Alerts: Yes Hospitalized since last visit: No Patient Alerts: Patient on Blood Thinner Implantable device outside of the clinic excluding No '81mg'$  aspirin cellular tissue based products placed in the center Type II Diabetic since last visit: Has Dressing in Place as Prescribed: Yes Has Compression in Place as Prescribed: Yes Pain Present Now: No Electronic Signature(s) Signed: 12/21/2022 10:27:37 AM By: Massie Kluver Entered By: Massie Kluver on 12/08/2022 14:22:37 -------------------------------------------------------------------------------- Clinic Level of Care Assessment Details Patient Name: Date of Service: [name], [name] 12/08/2022 2:15 PM Medical Record Number: [id number] Patient Account Number: [id number] Date of Birth/Sex: Treating RN: 08-02-1941 (82 y.o. Orvan Falconer Primary Care Camyra Vaeth: [name] Other  Clinician: Massie Kluver Referring Shakyra Mattera: Treating Harwood Nall/Extender: Eldridge Dace, [name] in Treatment: 15 Clinic Level of Care Assessment Items TOOL 1 Quantity Score '[]'$  - 0 Use when EandM and Procedure is performed on INITIAL visit ASSESSMENTS - Nursing Assessment / Reassessment '[]'$  - 0 General Physical Exam (combine w/ comprehensive assessment (listed just below) when performed on new pt. evalsHAILLEY, [name] ([id number]) [id number] Page 2 of 9 '[]'$  - 0 Comprehensive Assessment (HX, ROS, Risk Assessments, Wounds Hx, etc.) ASSESSMENTS - Wound and Skin Assessment / Reassessment '[]'$  - 0 Dermatologic / Skin Assessment (not related to wound area) ASSESSMENTS - Ostomy and/or Continence Assessment and Care '[]'$  - 0 Incontinence Assessment and Management '[]'$  - 0 Ostomy Care Assessment and Management (repouching, etc.) PROCESS - Coordination of Care '[]'$  - 0 Simple Patient / Family Education for ongoing care '[]'$  - 0 Complex (extensive) Patient / Family Education for ongoing care '[]'$  - 0 Staff obtains Programmer, systems, Records, T Results / Process Orders est '[]'$  - 0 Staff telephones HHA, Nursing Homes / Clarify orders / etc '[]'$  - 0 Routine Transfer to another Facility (non-emergent condition) '[]'$  - 0 Routine Hospital Admission (non-emergent condition) '[]'$  - 0 New Admissions / Biomedical engineer / Ordering NPWT Apligraf, etc. , '[]'$  - 0 Emergency Hospital Admission (emergent condition) PROCESS - Special Needs '[]'$  - 0 Pediatric / Minor Patient Management '[]'$  - 0 Isolation Patient Management '[]'$  - 0 Hearing / Language / Visual special needs '[]'$  - 0 Assessment of Community assistance (transportation, D/C planning, etc.) '[]'$  - 0 Additional assistance / Altered mentation '[]'$  - 0 Support Surface(s) Assessment (bed, cushion, seat, etc.) INTERVENTIONS - Miscellaneous '[]'$  - 0 External ear exam '[]'$  - 0 Patient Transfer (multiple staff / Civil Service fast streamer /  Similar devices) '[]'$  - 0 Simple Staple / Suture removal (25 or less) '[]'$  - 0 Complex Staple / Suture removal (26 or more) '[]'$  - 0 Hypo/Hyperglycemic  Management (do not check if billed separately) '[]'$  - 0 Ankle / Brachial Index (ABI) - do not check if billed separately Has the patient been seen at the hospital within the last three years: Yes Total Score: 0 Level Of Care: ____ Electronic Signature(s) Signed: 12/21/2022 10:27:37 AM By: Massie Kluver Entered By: Massie Kluver on 12/08/2022 14:52:17 -------------------------------------------------------------------------------- Compression Therapy Details Patient Name: Date of Service: [name] 12/08/2022 2:15 PM Medical Record Number: [id number] Patient Account Number: [id number] Date of Birth/Sex: Treating RN: March 04, 1941 (82 y.o. Orvan Falconer Primary Care Alis Sawchuk: [name] Other Clinician: Kolbie, Clarkston B ([id number]) [id number] Page 3 of 9 Referring Dlynn Ranes: Treating Kieana Livesay/Extender: RO BSO N, [name] in Treatment: 15 Compression Therapy Performed for Wound Assessment: Wound #1 Right,Medial Lower Leg Performed By: Lenice Pressman, [name], Compression Type: Four Layer Pre Treatment ABI: 0.9 Post Procedure Diagnosis Same as Pre-procedure Electronic Signature(s) Signed: 12/21/2022 10:27:37 AM By: Massie Kluver Entered By: Massie Kluver on 12/08/2022 14:51:46 -------------------------------------------------------------------------------- Encounter Discharge Information Details Patient Name: Date of Service: [name] 12/08/2022 2:15 PM Medical Record Number: [id number] Patient Account Number: [id number] Date of Birth/Sex: Treating RN: 1941-04-09 (82 y.o. Orvan Falconer Primary Care Artavius Stearns: [name] Other Clinician: Massie Kluver Referring Teshawn Moan: Treating Swayze Pries/Extender: Eldridge Dace, [name] in  Treatment: 15 Encounter Discharge Information Items Discharge Condition: Stable Ambulatory Status: Ambulatory Discharge Destination: Home Transportation: Private Auto Accompanied By: daughter Schedule Follow-up Appointment: Yes Clinical Summary of Care: Electronic Signature(s) Signed: 12/21/2022 10:27:37 AM By: Massie Kluver Entered By: Massie Kluver on 12/08/2022 15:15:12 -------------------------------------------------------------------------------- Lower Extremity Assessment Details Patient Name: Date of Service: DIONNE, KNOOP 12/08/2022 2:15 PM Medical Record Number: [id number] Patient Account Number: [id number] Date of Birth/Sex: Treating RN: July 21, 1941 (82 y.o. Orvan Falconer Primary Care Eion Timbrook: [name] Other Clinician: Massie Kluver Referring Georgiana Spillane: Treating Rajveer Handler/Extender: Eldridge Dace, [name] in Treatment: [address], [place] ([id number]) [id number] Page 4 of 9 Edema Assessment Assessed: [Left: No] [Right: Yes] Edema: [Left: Ye] [Right: s] Calf Left: Right: Point of Measurement: 30 cm From Medial Instep 33.5 cm Ankle Left: Right: Point of Measurement: 10 cm From Medial Instep 21 cm Vascular Assessment Pulses: Dorsalis Pedis Palpable: [Right:Yes] Electronic Signature(s) Signed: 12/09/2022 9:37:21 AM By: Carlene Coria RN Signed: 12/21/2022 10:27:37 AM By: Massie Kluver Entered By: Massie Kluver on 12/08/2022 14:37:36 -------------------------------------------------------------------------------- Multi Wound Chart Details Patient Name: Date of Service: [name] 12/08/2022 2:15 PM Medical Record Number: [id number] Patient Account Number: [id number] Date of Birth/Sex: Treating RN: 06-11-1941 (82 y.o. 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 Primary Etiology: Extremity Hypertension, Type II Diabetes, N/A N/A Comorbid History: Osteoarthritis, Neuropathy, Received Chemotherapy, Received Radiation 06/27/2022 N/A N/A Date AcquiredCAYLIE, SANDQUIST ([id number]) [id number] Page 5 of 9 15 N/A N/A Weeks of Treatment: Open N/A N/A Wound Status: No N/A N/A Wound Recurrence: Yes N/A N/A Clustered Wound: 0.4x0.2x2.1 N/A N/A Measurements L x W x D (cm) 0.063 N/A N/A A (cm) : rea 0.132 N/A N/A Volume (cm) : 99.00% N/A N/A % Reduction  in A rea: 98.70% N/A N/A % Reduction in Volume: 3 Position 1 (o'clock): 2.1 Maximum Distance 1 (cm): Yes N/A N/A Tunneling: Grade 2 N/A N/A Classification: Medium N/A N/A Exudate A mount: Serosanguineous N/A N/A Exudate Type: red, brown N/A N/A Exudate Color: Thickened N/A N/A Wound Margin: Small (1-33%) N/A N/A Granulation A mount: Red N/A N/A Granulation Quality: Large (67-100%) N/A N/A Necrotic A mount: Eschar, Adherent Slough N/A N/A Necrotic Tissue: Fat Layer (Subcutaneous Tissue): Yes N/A N/A Exposed Structures: Fascia: No Tendon: No Muscle: No Joint: No Bone: No None N/A N/A Epithelialization: Treatment Notes Electronic Signature(s) Signed: 12/21/2022 10:27:37 AM By: Massie Kluver Entered By: Massie Kluver on 12/08/2022 14:37:40 -------------------------------------------------------------------------------- Multi-Disciplinary Care Plan Details Patient Name: Date of Service: [name] 12/08/2022 2:15 PM Medical Record Number: [id number] Patient Account Number: [id number] Date of Birth/Sex:  Treating RN: [birth year] (82 y.o. Orvan Falconer Primary Care Anyi Fels: [name] Other Clinician: Massie Kluver Referring Tighe Gitto: Treating Sorrel Cassetta/Extender: Eldridge Dace, [name] in Treatment: 15 Active Inactive Necrotic Tissue Nursing Diagnoses: Impaired tissue integrity related to necrotic/devitalized tissue Knowledge deficit related to management of necrotic/devitalized tissue Goals: Necrotic/devitalized tissue will be minimized in the wound bed Date Initiated: 08/25/2022 Target Resolution Date: 08/25/2022 Goal Status: Active Patient/caregiver will verbalize understanding of reason and process for debridement of necrotic tissue Date Initiated: 08/25/2022 Target Resolution Date: 08/25/2022 Goal Status: Active Interventions: Assess patient pain level pre-, during and post procedure and prior to discharge AREESHA, DEHAVEN B ([id number]) [id number] Page 6 of 9 Provide education on necrotic tissue and debridement process Treatment Activities: Excisional debridement : 08/25/2022 Notes: Pain, Acute or Chronic Nursing Diagnoses: Pain Management - Cyclic Acute (Dressing Change Related) Pain, acute or chronic: actual or potential Potential alteration in comfort, pain Goals: Patient will verbalize adequate pain control and receive pain control interventions during procedures as needed Date Initiated: 08/25/2022 Target Resolution Date: 08/25/2022 Goal Status: Active Patient/caregiver will verbalize adequate pain control between visits Date Initiated: 08/25/2022 Target Resolution Date: 08/25/2022 Goal Status: Active Patient/caregiver will verbalize comfort level met Date Initiated: 08/25/2022 Target Resolution Date: 08/25/2022 Goal Status: Active Interventions: Provide education on pain management Reposition patient for comfort Treatment Activities: Administer pain control measures as ordered : 08/25/2022 Notes: Peripheral  Neuropathy Nursing Diagnoses: Knowledge deficit related to disease process and management of peripheral neurovascular dysfunction Potential alteration in peripheral tissue perfusion (select prior to confirmation of diagnosis) Goals: Patient/caregiver will verbalize understanding of disease process and disease management Date Initiated: 08/25/2022 Target Resolution Date: 08/25/2022 Goal Status: Active Interventions: Assess signs and symptoms of neuropathy upon admission and as needed Provide education on Management of Neuropathy and Related Ulcers Notes: Wound/Skin Impairment Nursing Diagnoses: Impaired tissue integrity Knowledge deficit related to smoking impact on wound healing Knowledge deficit related to ulceration/compromised skin integrity Goals: Patient/caregiver will verbalize understanding of skin care regimen Date Initiated: 08/25/2022 Target Resolution Date: 08/25/2022 Goal Status: Active Ulcer/skin breakdown will have a volume reduction of 30% by week 4 Date Initiated: 08/25/2022 Date Inactivated: 11/17/2022 Target Resolution Date: 09/22/2022 Goal Status: Met Ulcer/skin breakdown will have a volume reduction of 50% by week 8 Date Initiated: 11/17/2022 Date Inactivated: 11/17/2022 Target Resolution Date: 10/20/2022 Goal Status: Met Ulcer/skin breakdown will have a volume reduction of 80% by week 12 Date Initiated: 11/17/2022 Date Inactivated: 11/17/2022 Target Resolution Date: 11/17/2022 Goal Status: Met Interventions: Assess ulceration(s) every visit Provide education on smoking NotesKEANU, FRICKEY ([id number]) [phone].pdf Page 7 of 9 Electronic Signature(s) Signed: 12/09/2022  9:37:21 AM By: Carlene Coria RN Signed: 12/21/2022 10:27:37 AM By: Massie Kluver Entered By: Massie Kluver on 12/08/2022 15:12:35 -------------------------------------------------------------------------------- Pain Assessment Details Patient Name: Date of  Service: [name] 12/08/2022 2:15 PM Medical Record Number: [id number] Patient Account Number: [id number] Date of Birth/Sex: Treating RN: May 06, 1941 (82 y.o. Orvan Falconer Primary Care Tinesha Siegrist: [name] Other Clinician: Massie Kluver Referring Myley Bahner: Treating Shammond Arave/Extender: Eldridge Dace, [name] in Treatment: 15 Active Problems Location of Pain Severity and Description of Pain Patient Has Paino No Site Locations Pain Management and Medication Current Pain Management: Electronic Signature(s) Signed: 12/09/2022 9:37:21 AM By: Carlene Coria RN Signed: 12/21/2022 10:27:37 AM By: Massie Kluver Entered By: Massie Kluver on 12/08/2022 14:25:56 Rosander, Taquita B ([id number]) [id number] Page 8 of 9 -------------------------------------------------------------------------------- Patient/Caregiver Education Details Patient Name: Date of Service: JULIE-ANN, VANMAANEN 12/27/2023andnbsp2:15 PM Medical Record Number: [id number] Patient Account Number: [id number] Date of Birth/Gender: Treating RN: May 03, 1941 (82 y.o. Orvan Falconer Primary Care Physician: [name] Other Clinician: Massie Kluver Referring Physician: Treating Physician/Extender: Eldridge Dace, [name] in Treatment: 15 Education Assessment Education Provided To: Patient Education Topics Provided Wound/Skin Impairment: Handouts: Other: continue wound care as directed Methods: Explain/Verbal Responses: State content correctly Electronic Signature(s) Signed: 12/21/2022 10:27:37 AM By: Massie Kluver Entered By: Massie Kluver on 12/08/2022 14:52:50 -------------------------------------------------------------------------------- Wound Assessment Details Patient Name: Date of Service: [name] 12/08/2022 2:15 PM Medical Record Number: [id number] Patient Account Number: [id number] Date of Birth/Sex: Treating RN: 24-Mar-1941 (81  y.o. Orvan Falconer Primary Care Twain Stenseth: [name] Other Clinician: Massie Kluver Referring Marcellino Fidalgo: Treating Soley Harriss/Extender: Eldridge Dace, [name] in Treatment: 15 Wound Status Wound Number: 1 Primary Diabetic Wound/Ulcer of the Lower Extremity Etiology: Wound Location: Right, Medial Lower Leg Wound Open Wounding Event: Trauma Status: Date Acquired: 06/27/2022 Comorbid Hypertension, Type II Diabetes, Osteoarthritis, Neuropathy, Weeks Of Treatment: 15 History: Received Chemotherapy, Received Radiation Clustered Wound: Yes Photos Wound Measurements Length: (cm) 0.4 Sluka, Jeremie B ([id number]) Width: (cm) 0. Depth: (cm) 2. Area: (cm) 0 Volume: (cm) 0 % Reduction in Area: 99% [id number] Page 9 of 9 2 % Reduction in Volume: 98.7% 1 Epithelialization: None .063 Tunneling: Yes .132 Position (o'clock): 3 Maximum Distance: (cm) 2.1 Undermining: No Wound Description Classification: Grade 2 Wound Margin: Thickened Exudate Amount: Medium Exudate Type: Serosanguineous Exudate Color: red, brown Foul Odor After Cleansing: No Slough/Fibrino Yes Wound Bed Granulation Amount: Small (1-33%) Exposed Structure Granulation Quality: Red Fascia Exposed: No Necrotic Amount: Large (67-100%) Fat Layer (Subcutaneous Tissue) Exposed: Yes Necrotic Quality: Eschar, Adherent Slough Tendon Exposed: No Muscle Exposed: No Joint Exposed: No Bone Exposed: No Electronic Signature(s) Signed: 12/09/2022 9:37:21 AM By: Carlene Coria RN Signed: 12/21/2022 10:27:37 AM By: Massie Kluver Entered By: Massie Kluver on 12/08/2022 14:36:35 -------------------------------------------------------------------------------- [place] Details Patient Name: Date of Service: [name] 12/08/2022 2:15 PM Medical Record Number: [id number] Patient Account Number: [id number] Date of Birth/Sex: Treating RN: [birth year] (82 y.o. Orvan Falconer Primary Care Kamyla Olejnik: [name] Other Clinician: Massie Kluver Referring Talisha Erby: Treating Hadassah Rana/Extender: Eldridge Dace, [name] in Treatment: 15 Vital Signs Time Taken: 14:22 Temperature (F): 97.8 Weight (lbs): 172 Pulse (bpm): 75 Respiratory Rate (breaths/min): 18 Blood Pressure (mmHg): 180/66 Reference Range: 80 - 120 mg / dl Electronic Signature(s) Signed: 12/21/2022 10:27:37 AM By: Massie Kluver Entered By: Massie Kluver on 12/08/2022 14:25:53

## 2022-12-22 ENCOUNTER — Encounter (HOSPITAL_BASED_OUTPATIENT_CLINIC_OR_DEPARTMENT_OTHER): Payer: Medicare PPO | Admitting: Internal Medicine

## 2022-12-22 DIAGNOSIS — L97818 Non-pressure chronic ulcer of other part of right lower leg with other specified severity: Secondary | ICD-10-CM | POA: Diagnosis not present

## 2022-12-22 DIAGNOSIS — E11622 Type 2 diabetes mellitus with other skin ulcer: Secondary | ICD-10-CM

## 2022-12-22 DIAGNOSIS — I87311 Chronic venous hypertension (idiopathic) with ulcer of right lower extremity: Secondary | ICD-10-CM

## 2022-12-22 DIAGNOSIS — T798XXA Other early complications of trauma, initial encounter: Secondary | ICD-10-CM

## 2022-12-23 NOTE — Progress Notes (Signed)
[name], [name] ([id number]) [id number] Page 1 of 8 Visit Report for 12/22/2022 Chief Complaint Document Details Patient Name: Date of Service: [name], [name] 12/22/2022 2:00 PM Medical Record Number: [id number] Patient Account Number: [id number] Date of Birth/Sex: Treating RN: Mar 08, 1941 (82 y.o. [name] Primary Care Provider: [name] Other Clinician: Massie Kluver Referring Provider: Treating Provider/Extender: Laural Roes in Treatment: 17 Information Obtained from: Patient Chief Complaint 08/25/2022; right lower extremity wounds secondary to traumatic hematoma Electronic Signature(s) Signed: 12/22/2022 4:40:21 PM By: Kalman Shan DO Entered By: Kalman Shan on 12/22/2022 14:40:58 -------------------------------------------------------------------------------- HPI Details Patient Name: Date of Service: [name] Puna B. 12/22/2022 2:00 PM Medical Record Number: [id number] Patient Account Number: [id number] Date of Birth/Sex: Treating RN: 03/20/1941 (82 y.o. [name] Primary Care Provider: [name] Other Clinician: Massie Kluver Referring Provider: Treating Provider/Extender: Laural Roes in Treatment: 17 History of Present Illness HPI Description: Admission 08/25/2022 [name] is an 82 year old female with a past medical history of breast cancer, with insulin dependent, controlled type 2 diabetes, chronic venous insufficiency that presents to the clinic for a 16-monthhistory of nonhealing ulcer to the right lower extremity. She states she hit her leg against a stair and developed a hematoma. The area was evacuated by her primary care physician. She has tried Neosporin and wet-to-dry dressings. She reports mild chronic pain to the area. She denies increased warmth, erythema or purulent drainage. 9/20; patient presents for follow-up. She had an x-ray of the right  leg done at last clinic visit that did not show Evidence of osteomyelitis. She has been doing Dakin's wet-to-dry dressings without issues. She denies signs of infection. 9/27; patient presents for follow-up. Hydrofera Blue with antibiotic ointment was used under Kerlix/Coban at last clinic visit. She tolerated this well. She has no issues or complaints today. She denies signs of infection. 10/4; patient presents for follow-up. We have been using Hydrofera Blue to the wound bed all under Kerlix/Coban. She has no issues or complaints today. She has tolerated this well. 10/11; patient presents for follow-up. We have been using Hydrofera Blue to the wound bed under Kerlix/Coban. She has tolerated this well and has no issues or complaints. EKENDI, DEFALCO([id number] Page 2 of 8 10/18; patient presents for follow-up. We have been using Hydrofera Blue with Santyl to the wound bed under Kerlix/Coban. She has no issues or complaints today. 10/25; this is a patient with a right lower extremity medial leg ulcer in the setting of fairly severe chronic venous insufficiency. She has been using Santyl Hydrofera Blue rope Hydrofera Blue under kerlix Coban. She presents with 3 tunneling areas in the wound. 11/1; this is a patient with a difficult right lower extremity medial leg ulcer in the setting of fairly clear chronic venous hypertension, hemosiderin deposition. This was initially traumatic in the setting of a fairly clear hematoma looking at cell phone pictures today. We have been using Hydrofera Blue under compression. 11/8; patient presents for follow-up. We have been using Hydrofera Blue blue under compression therapy. She has no issues or complaints today. Snap VAC was recommended at last clinic visit and this was run through insurance. We will ask for clarification on cost. 11/15; patient presents for follow-up. We have been using Hydrofera Blue with antibiotic  ointment under compression therapy. Patient has no issues or complaints today. 11/22; patient presents for follow-up. We have been using silver alginate with antibiotic ointment under compression therapy. Patient denies  signs of infection. 11/29; patient presents for follow-up. We have been using silver alginate with antibiotic ointment under compression therapy. One of the tunnels has healed up. She has no issues or complaints today. 12/6; patient presents for follow-up. We have been using silver alginate with antibiotic ointment under compression therapy. She has no issues or complaints today. 12/13; patient presents for follow-up. We have been using silver alginate with antibiotic ointment under compression therapy. She denies signs of infection. She hit her left leg against a box. She has some mild bruising. No open wound. 12/20; patient presents for follow-up. We have been using endoform with antibiotic ointment under compression therapy. Patient has no issues or complaints today. The ecchymosis has resolved to the left lower extremity. 12/27. We have been using endoform with coating antibiotics superficial cover of Hydrofera Blue under 4-layer compression she has 2.5 cm of tunneling depth is measured by myself today. She is completing clindamycin complains that made her ill but no complaints of diarrhea. We looked over pictures on her for family's phone at the time this was done this was fairly clearly a hematoma 1/3; patient presents for follow-up. We have been using endoform with topical antibiotics and superficial culture of Hydrofera Blue under 4-layer compression. She has no issues or complaints today. 1/10; patient presents for follow-up. We have been using endoform with topical antibiotic and superficial cover of Hydrofera Blue under 4-layer compression. She has no issues or complaints today. There is been improvement in wound healing. Electronic Signature(s) Signed: 12/22/2022 4:40:21  PM By: Kalman Shan DO Entered By: Kalman Shan on 12/22/2022 14:41:26 -------------------------------------------------------------------------------- Physical Exam Details Patient Name: Date of Service: [name] 12/22/2022 2:00 PM Medical Record Number: [id number] Patient Account Number: [id number] Date of Birth/Sex: Treating RN: 1941/01/29 (82 y.o. [name] Primary Care Provider: [name] Other Clinician: Massie Kluver Referring Provider: Treating Provider/Extender: Laural Roes in Treatment: 17 Constitutional . Cardiovascular . Psychiatric . Notes Right lower extremity: T the medial aspect there is a small wound with depth of 1.7 cm. No signs of infection. Good edema control. Venous stasis dermatitis. TOLUWANI, RUDER B ([id number]) [id number] Page 3 of 8 Electronic Signature(s) Signed: 12/22/2022 4:40:21 PM By: Kalman Shan DO Entered By: Kalman Shan on 12/22/2022 14:41:47 -------------------------------------------------------------------------------- Physician Orders Details Patient Name: Date of Service: [name] Puna B. 12/22/2022 2:00 PM Medical Record Number: [id number] Patient Account Number: [id number] Date of Birth/Sex: Treating RN: 1940/12/19 (82 y.o. [name] Primary Care Provider: [name] Other Clinician: Massie Kluver Referring Provider: Treating Provider/Extender: Laural Roes in Treatment: [phone] Verbal / Phone Orders: No Diagnosis Coding Follow-up Appointments Wound #1 Right,Medial Lower Leg Return Appointment in 1 week. Nurse Visit as needed Bathing/ Shower/ Hygiene May shower with wound dressing protected with water repellent cover or cast protector. No tub bath. Anesthetic (Use 'Patient Medications' Section for Anesthetic Order Entry) Lidocaine applied to wound bed Edema Control - Lymphedema / Segmental Compressive Device /  Other Tubigrip single layer applied. - Tubi D single left lower leg Elevate legs to the level of the heart and pump ankles as often as possible Medications-Please add to medication list. ntibiotic - Gentamycin and Mupirocin Topical A Wound Treatment Wound #1 - Lower Leg Wound Laterality: Right, Medial Cleanser: Dakin 16 (oz) 0.25 1 x Per Week/30 Days Discharge Instructions: Use as directed. Topical: Gentamicin 1 x Per Week/30 Days Discharge Instructions: Apply very small amount to hydrofera rope and place into tunnels Topical:  Mupirocin Ointment 1 x Per Week/30 Days Discharge Instructions: Apply very small amount to hydrofera rope and place into tunnels Prim Dressing: Endoform Natural, Non-fenestrated, 2x2 (in/in) 1 x Per Week/30 Days ary Discharge Instructions: apply tiny amount of antibiotic ointment and lightly pack into tunnel Secondary Dressing: Zetuvit Plus 4x8 (in/in) 1 x Per Week/30 Days Secondary Dressing: hydrofera blue rope 1 x Per Week/30 Days Discharge Instructions: pack small piece into top of wound to keep open Compression Wrap: Medichoice 4 layer Compression System, 35-40 mmHG 1 x Per Week/30 Days Discharge Instructions: Apply multi-layer wrap as directed. Electronic Signature(s) Signed: 12/22/2022 4:40:21 PM By: Verda Cumins ([id number]) [id number] Page 4 of 8 Entered By: Kalman Shan on 12/22/2022 14:43:32 -------------------------------------------------------------------------------- Problem List Details Patient Name: Date of Service: NAKAYA, MISHKIN 12/22/2022 2:00 PM Medical Record Number: [id number] Patient Account Number: [id number] Date of Birth/Sex: Treating RN: [birth year] (82 y.o. [name] Primary Care Provider: [name] Other Clinician: Massie Kluver Referring Provider: Treating Provider/Extender: Laural Roes in Treatment: 17 Active  Problems ICD-10 Encounter Code Description Active Date MDM Diagnosis L97.818 Non-pressure chronic ulcer of other part of right lower leg with other specified 08/25/2022 No Yes severity T79.8XXA Other early complications of trauma, initial encounter 08/25/2022 No Yes T79.2XXA Traumatic secondary and recurrent hemorrhage and seroma, initial encounter 08/25/2022 No Yes E11.622 Type 2 diabetes mellitus with other skin ulcer 08/25/2022 No Yes E03.9 Hypothyroidism, unspecified 08/25/2022 No Yes I10 Essential (primary) hypertension 08/25/2022 No Yes I87.311 Chronic venous hypertension (idiopathic) with ulcer of right lower extremity 08/25/2022 No Yes Inactive Problems Resolved Problems Electronic Signature(s) Signed: 12/22/2022 4:40:21 PM By: Kalman Shan DO Entered By: Kalman Shan on 12/22/2022 14:40:53 [place], Seri B ([id number]) [id number] Page 5 of 8 -------------------------------------------------------------------------------- Progress Note Details Patient Name: Date of Service: JANIS, SOL 12/22/2022 2:00 PM Medical Record Number: [id number] Patient Account Number: [id number] Date of Birth/Sex: Treating RN: 1941/11/24 (82 y.o. [name] Primary Care Provider: [name] Other Clinician: Massie Kluver Referring Provider: Treating Provider/Extender: Laural Roes in Treatment: 17 Subjective Chief Complaint Information obtained from Patient 08/25/2022; right lower extremity wounds secondary to traumatic hematoma History of Present Illness (HPI) Admission 08/25/2022 Ms. Tinzlee Craker is an 82 year old female with a past medical history of breast cancer, with insulin dependent, controlled type 2 diabetes, chronic venous insufficiency that presents to the clinic for a 56-monthhistory of nonhealing ulcer to the right lower extremity. She states she hit her leg against a stair and developed a hematoma. The area was  evacuated by her primary care physician. She has tried Neosporin and wet-to-dry dressings. She reports mild chronic pain to the area. She denies increased warmth, erythema or purulent drainage. 9/20; patient presents for follow-up. She had an x-ray of the right leg done at last clinic visit that did not show Evidence of osteomyelitis. She has been doing Dakin's wet-to-dry dressings without issues. She denies signs of infection. 9/27; patient presents for follow-up. Hydrofera Blue with antibiotic ointment was used under Kerlix/Coban at last clinic visit. She tolerated this well. She has no issues or complaints today. She denies signs of infection. 10/4; patient presents for follow-up. We have been using Hydrofera Blue to the wound bed all under Kerlix/Coban. She has no issues or complaints today. She has tolerated this well. 10/11; patient presents for follow-up. We have been using Hydrofera Blue to the wound bed under Kerlix/Coban. She has tolerated this well and has no issues  or complaints. 10/18; patient presents for follow-up. We have been using Hydrofera Blue with Santyl to the wound bed under Kerlix/Coban. She has no issues or complaints today. 10/25; this is a patient with a right lower extremity medial leg ulcer in the setting of fairly severe chronic venous insufficiency. She has been using Santyl Hydrofera Blue rope Hydrofera Blue under kerlix Coban. She presents with 3 tunneling areas in the wound. 11/1; this is a patient with a difficult right lower extremity medial leg ulcer in the setting of fairly clear chronic venous hypertension, hemosiderin deposition. This was initially traumatic in the setting of a fairly clear hematoma looking at cell phone pictures today. We have been using Hydrofera Blue under compression. 11/8; patient presents for follow-up. We have been using Hydrofera Blue blue under compression therapy. She has no issues or complaints today. Snap VAC was recommended at  last clinic visit and this was run through insurance. We will ask for clarification on cost. 11/15; patient presents for follow-up. We have been using Hydrofera Blue with antibiotic ointment under compression therapy. Patient has no issues or complaints today. 11/22; patient presents for follow-up. We have been using silver alginate with antibiotic ointment under compression therapy. Patient denies signs of infection. 11/29; patient presents for follow-up. We have been using silver alginate with antibiotic ointment under compression therapy. One of the tunnels has healed up. She has no issues or complaints today. 12/6; patient presents for follow-up. We have been using silver alginate with antibiotic ointment under compression therapy. She has no issues or complaints today. 12/13; patient presents for follow-up. We have been using silver alginate with antibiotic ointment under compression therapy. She denies signs of infection. She hit her left leg against a box. She has some mild bruising. No open wound. 12/20; patient presents for follow-up. We have been using endoform with antibiotic ointment under compression therapy. Patient has no issues or complaints today. The ecchymosis has resolved to the left lower extremity. 12/27. We have been using endoform with coating antibiotics superficial cover of Hydrofera Blue under 4-layer compression she has 2.5 cm of tunneling depth is measured by myself today. She is completing clindamycin complains that made her ill but no complaints of diarrhea. We looked over pictures on her for family's phone at the time this was done this was fairly clearly a hematoma 1/3; patient presents for follow-up. We have been using endoform with topical antibiotics and superficial culture of Hydrofera Blue under 4-layer compression. She has no issues or complaints today. 1/10; patient presents for follow-up. We have been using endoform with topical antibiotic and superficial  cover of Hydrofera Blue under 4-layer compression. She has no issues or complaints today. There is been improvement in wound healing. MARGEE, TRENTHAM ([id number]) [id number] Page 6 of 8 Objective Constitutional Vitals Time Taken: 2:17 PM, Weight: 172 lbs, Temperature: 98.1 F, Pulse: 81 bpm, Respiratory Rate: 18 breaths/min, Blood Pressure: 173/88 mmHg. General Notes: Right lower extremity: T the medial aspect there is a small wound with depth of 1.7 cm. No signs of infection. Good edema control. Venous o stasis dermatitis. Integumentary (Hair, Skin) Wound #1 status is Open. Original cause of wound was Trauma. The date acquired was: 06/27/2022. The wound has been in treatment 17 weeks. The wound is located on the Right,Medial Lower Leg. The wound measures 0.2cm length x 0.1cm width x 1.7cm depth; 0.016cm^2 area and 0.027cm^3 volume. There is Fat Layer (Subcutaneous Tissue) exposed. There is a medium amount of serosanguineous drainage noted. The  wound margin is thickened. There is small (1-33%) red granulation within the wound bed. There is a large (67-100%) amount of necrotic tissue within the wound bed including Eschar and Adherent Slough. 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 I recommended continuing the course with topical antibiotic ointment and endoform with Hydrofera Blue covering under 4-layer compression. Follow-up in 1 week. Procedures Wound #1 Pre-procedure diagnosis of  Wound #1 is a Diabetic Wound/Ulcer of the Lower Extremity located on the Right,Medial Lower Leg . There was a Four Layer Compression Therapy Procedure with a pre-treatment ABI of 0.9 by Massie Kluver. Post procedure Diagnosis Wound #1: Same as Pre-Procedure Plan Follow-up Appointments: Wound #1 Right,Medial Lower Leg: Return Appointment in 1 week. Nurse Visit as needed Bathing/ Shower/ Hygiene: May shower with wound dressing protected with water repellent cover or cast protector. No tub bath. Anesthetic (Use 'Patient Medications' Section for Anesthetic Order Entry): Lidocaine applied to wound bed Edema Control - Lymphedema / Segmental Compressive Device / Other: Tubigrip single layer applied. - Tubi D single left lower leg Elevate legs to the level of the heart and pump ankles as often as possible Medications-Please add to medication list.: Topical Antibiotic - Gentamycin and Mupirocin WOUND #1: - Lower Leg Wound Laterality: Right, Medial Cleanser: Dakin 16 (oz) 0.25 1 x Per Week/30 Days Discharge Instructions: Use as directed. Topical: Gentamicin 1 x Per Week/30 Days Discharge Instructions: Apply very small amount to hydrofera rope and place into tunnels Topical: Mupirocin Ointment 1 x Per Week/30 Days Discharge Instructions: Apply very small amount to hydrofera rope and place into tunnels Prim Dressing: Endoform Natural, Non-fenestrated, 2x2 (in/in) 1 x Per Week/30 Days ary Discharge Instructions: apply tiny amount of antibiotic ointment and lightly pack into tunnel RHYA, SHAN B ([id number]) [id number] Page 7 of 8 Secondary Dressing: Zetuvit Plus 4x8 (in/in) 1 x Per Week/30 Days Secondary Dressing: hydrofera blue rope 1 x Per Week/30 Days Discharge Instructions: pack small piece into top of wound to keep open Compression Wrap: Medichoice 4 layer Compression System, 35-40 mmHG 1 x Per Week/30 Days Discharge Instructions: Apply multi-layer wrap as  directed. 1. Endoform with antibiotic ointment and Hydrofera Blue covering under 4-layer compressionooright lower extremity 2. Follow-up in 1 week Electronic Signature(s) Signed: 12/22/2022 4:40:21 PM By: Kalman Shan DO Entered By: Kalman Shan on 12/22/2022 14:42:31 -------------------------------------------------------------------------------- ROS/PFSH Details Patient Name: Date of Service: [name] Puna B. 12/22/2022 2:00 PM Medical Record Number: [id number] Patient Account Number: [id number] Date of Birth/Sex: Treating RN: 08-Oct-1941 (82 y.o. [name] Primary Care Provider: [name] Other Clinician: Massie Kluver Referring Provider: Treating Provider/Extender: Laural Roes in Treatment: 17 Cardiovascular Medical History: Positive for: Hypertension Endocrine Medical History: Positive for: Type II Diabetes Time with diabetes: 40+ Treated with: Insulin Blood sugar tested every day: Yes Tested : 67 Musculoskeletal Medical History: Positive for: Osteoarthritis - Shoulders Neurologic Medical History: Positive for: Neuropathy Oncologic Medical History: Positive for: Received Chemotherapy; Received Radiation Immunizations Pneumococcal Vaccine: Received Pneumococcal Vaccination: Yes Received Pneumococcal Vaccination On or After 60th Birthday: Yes Implantable Devices None Family and Social History THY, GULLIKSON ([id number]) [id number] Page 8 of  8 Never smoker; Marital Status - Married; Alcohol Use: Never; Drug Use: No History; Caffeine Use: Daily Electronic Signature(s) Signed: 12/22/2022 4:40:21 PM By: Kalman Shan DO Signed: 12/23/2022 3:17:21 PM By: Gretta Cool, BSN, RN, CWS, Kim RN, BSN Entered By: Kalman Shan on 12/22/2022 14:43:46 -------------------------------------------------------------------------------- SuperBill Details Patient Name: Date of Service: [name]  12/22/2022 Medical Record Number: [id number] Patient Account Number: [id number] Date of Birth/Sex: Treating RN: [birth year] (82 y.o. [name] Primary Care Provider: [name] Other Clinician: Massie Kluver Referring Provider: Treating Provider/Extender: Laural Roes in Treatment: 17 Diagnosis Coding ICD-10 Codes Code Description [phone] Non-pressure chronic ulcer of other part of right lower leg with other specified severity T79.8XXA Other early complications of trauma, initial encounter T15.2XXA Traumatic secondary and recurrent hemorrhage and seroma, initial encounter E11.622 Type 2 diabetes mellitus with other skin ulcer E03.9 Hypothyroidism, unspecified I10 Essential (primary) hypertension I87.311 Chronic venous hypertension (idiopathic) with ulcer of right lower extremity Facility Procedures : CPT4 Code: [id number] Description: (Facility Use Only) [phone] - APPLY MULTLAY COMPRS LWR RT LEG ICD-10 Diagnosis Description L97.818 Non-pressure chronic ulcer of other part of right lower leg with other specified sever Modifier: ity Quantity: 1 Physician Procedures : CPT4 Code Description Modifier [id number] - WC PHYS LEVEL 3 - EST PT ICD-10 Diagnosis Description L97.818 Non-pressure chronic ulcer of other part of right lower leg with other specified severity T79.8XXA Other early complications of trauma, initial  encounter E11.622 Type 2 diabetes mellitus with other skin ulcer I87.311 Chronic venous hypertension (idiopathic) with ulcer of right lower extremity Quantity: 1 Electronic Signature(s) Signed: 12/22/2022 4:40:21 PM By: Kalman Shan DO Entered By: Kalman Shan on 12/22/2022 14:42:52

## 2022-12-23 NOTE — Progress Notes (Signed)
[name] [name] ([id number]) [id number] Page 1 of 10 Visit Report for 12/22/2022 Arrival Information Details Patient Name: Date of Service: [name], [name] 12/22/2022 2:00 PM Medical Record Number: [id number] Patient Account Number: [id number] Date of Birth/Sex: Treating RN: 02/25/1941 (82 y.o. [name] Primary Care Ziah Leandro: [name] Other Clinician: Massie Kluver Referring Aliceson Dolbow: Treating Jennaya Pogue/Extender: Laural Roes in Treatment: 88 Visit Information History Since Last Visit All ordered tests and consults were completed: No Patient Arrived: Ambulatory Added or deleted any medications: No Arrival Time: 14:16 Any new allergies or adverse reactions: No Transfer Assistance: None Had a fall or experienced change in No Patient Identification Verified: Yes activities of daily living that may affect Secondary Verification Process Completed: Yes risk of falls: Patient Requires Transmission-Based Precautions: No Signs or symptoms of abuse/neglect since last visito No Patient Has Alerts: Yes Hospitalized since last visit: No Patient Alerts: Patient on Blood Thinner Implantable device outside of the clinic excluding No '81mg'$  aspirin cellular tissue based products placed in the center Type II Diabetic since last visit: Has Dressing in Place as Prescribed: Yes Has Compression in Place as Prescribed: Yes Pain Present Now: No Electronic Signature(s) Signed: 12/22/2022 5:10:32 PM By: Massie Kluver Entered By: Massie Kluver on 12/22/2022 14:17:08 -------------------------------------------------------------------------------- Clinic Level of Care Assessment Details Patient Name: Date of Service: [name] 12/22/2022 2:00 PM Medical Record Number: [id number] Patient Account Number: [id number] Date of Birth/Sex: Treating RN: 07-Nov-1941 (82 y.o. [name] Primary Care Atiana Levier: [name] Other Clinician:  Massie Kluver Referring Antero Derosia: Treating Wynelle Dreier/Extender: Laural Roes in Treatment: 17 Clinic Level of Care Assessment Items TOOL 1 Quantity Score '[]'$  - 0 Use when EandM and Procedure is performed on INITIAL visit ASSESSMENTS - Nursing Assessment / Reassessment '[]'$  - 0 General Physical Exam (combine w/ comprehensive assessment (listed just below) when performed on new pt. evalsKRYSTEENA, [name] ([id number]) [id number] Page 2 of 10 '[]'$  - 0 Comprehensive Assessment (HX, ROS, Risk Assessments, Wounds Hx, etc.) ASSESSMENTS - Wound and Skin Assessment / Reassessment '[]'$  - 0 Dermatologic / Skin Assessment (not related to wound area) ASSESSMENTS - Ostomy and/or Continence Assessment and Care '[]'$  - 0 Incontinence Assessment and Management '[]'$  - 0 Ostomy Care Assessment and Management (repouching, etc.) PROCESS - Coordination of Care '[]'$  - 0 Simple Patient / Family Education for ongoing care '[]'$  - 0 Complex (extensive) Patient / Family Education for ongoing care '[]'$  - 0 Staff obtains Programmer, systems, Records, T Results / Process Orders est '[]'$  - 0 Staff telephones HHA, Nursing Homes / Clarify orders / etc '[]'$  - 0 Routine Transfer to another Facility (non-emergent condition) '[]'$  - 0 Routine Hospital Admission (non-emergent condition) '[]'$  - 0 New Admissions / Biomedical engineer / Ordering NPWT Apligraf, etc. , '[]'$  - 0 Emergency Hospital Admission (emergent condition) PROCESS - Special Needs '[]'$  - 0 Pediatric / Minor Patient Management '[]'$  - 0 Isolation Patient Management '[]'$  - 0 Hearing / Language / Visual special needs '[]'$  - 0 Assessment of Community assistance (transportation, D/C planning, etc.) '[]'$  - 0 Additional assistance / Altered mentation '[]'$  - 0 Support Surface(s) Assessment (bed, cushion, seat, etc.) INTERVENTIONS - Miscellaneous '[]'$  - 0 External ear exam '[]'$  - 0 Patient Transfer (multiple staff / Civil Service fast streamer / Similar  devices) '[]'$  - 0 Simple Staple / Suture removal (25 or less) '[]'$  - 0 Complex Staple / Suture removal (26 or more) '[]'$  - 0 Hypo/Hyperglycemic Management (do not check if billed separately) '[]'$  -  0 Ankle / Brachial Index (ABI) - do not check if billed separately Has the patient been seen at the hospital within the last three years: Yes Total Score: 0 Level Of Care: ____ Electronic Signature(s) Signed: 12/22/2022 5:10:32 PM By: Massie Kluver Entered By: Massie Kluver on 12/22/2022 14:36:38 -------------------------------------------------------------------------------- Compression Therapy Details Patient Name: Date of Service: [name] 12/22/2022 2:00 PM Medical Record Number: [id number] Patient Account Number: [id number] Date of Birth/Sex: Treating RN: [birth year] (82 y.o. [name] Primary Care Kaedyn Polivka: [name] Other Clinician: Fortunata, [name] ([id number]) [id number] Page 3 of 10 Referring Percy Winterrowd: Treating Denise Bramblett/Extender: Laural Roes in Treatment: 17 Compression Therapy Performed for Wound Assessment: Wound #1 Right,Medial Lower Leg Performed By: Lenice Pressman, [name], Compression Type: Four Layer Pre Treatment ABI: 0.9 Post Procedure Diagnosis Same as Pre-procedure Electronic Signature(s) Signed: 12/22/2022 5:10:32 PM By: Massie Kluver Entered By: Massie Kluver on 12/22/2022 14:35:20 -------------------------------------------------------------------------------- Encounter Discharge Information Details Patient Name: Date of Service: [name] Puna [name]. 12/22/2022 2:00 PM Medical Record Number: [id number] Patient Account Number: [id number] Date of Birth/Sex: Treating RN: 08-20-1941 (82 y.o. [name] Primary Care Daton Szilagyi: [name] Other Clinician: Massie Kluver Referring Tyhesha Dutson: Treating Nimsi Males/Extender: Laural Roes in Treatment: 17 Encounter  Discharge Information Items Discharge Condition: Stable Ambulatory Status: Ambulatory Discharge Destination: Home Transportation: Private Auto Accompanied By: daughter Schedule Follow-up Appointment: Yes Clinical Summary of Care: Electronic Signature(s) Signed: 12/22/2022 5:10:32 PM By: Massie Kluver Entered By: Massie Kluver on 12/22/2022 14:55:38 -------------------------------------------------------------------------------- Lower Extremity Assessment Details Patient Name: Date of Service: [name], [name] 12/22/2022 2:00 PM Medical Record Number: [id number] Patient Account Number: [id number] Date of Birth/Sex: Treating RN: Mar 10, 1941 (82 y.o. [name] Primary Care Savior Himebaugh: [name] Other Clinician: Massie Kluver Referring Trissa Molina: Treating Reighlyn Elmes/Extender: Laural Roes in Treatment: [address] [place] ([id number]) [id number] Page 4 of 10 Edema Assessment Assessed: [Left: No] [Right: Yes] Edema: [Left: Ye] [Right: s] Calf Left: Right: Point of Measurement: 30 cm From Medial Instep 32.7 cm Ankle Left: Right: Point of Measurement: 10 cm From Medial Instep 20.6 cm Vascular Assessment Pulses: Dorsalis Pedis Palpable: [Right:Yes] Electronic Signature(s) Signed: 12/22/2022 5:10:32 PM By: Massie Kluver Signed: 12/23/2022 3:17:21 PM By: Gretta Cool, BSN, RN, CWS, Kim RN, BSN Entered By: Massie Kluver on 12/22/2022 14:31:18 -------------------------------------------------------------------------------- Multi Wound Chart Details Patient Name: Date of Service: [name] Puna [name]. 12/22/2022 2:00 PM Medical Record Number: [id number] Patient Account Number: [id number] Date of Birth/Sex: Treating RN: [birth year] (82 y.o. [name] Primary Care Shavonta Gossen: [name] Other Clinician: Massie Kluver Referring Yarah Fuente: Treating Latravis Grine/Extender: Laural Roes in Treatment: 17 Vital  Signs Height(in): Pulse(bpm): 81 Weight(lbs): 172 Blood Pressure(mmHg): 173/88 Body Mass Index(BMI): Temperature(F): 98.1 Respiratory Rate(breaths/min): 18 [1:Photos:] [N/A:N/A] Right, Medial Lower Leg N/A N/A Wound Location: Trauma N/A N/A Wounding Event: Diabetic Wound/Ulcer of the Lower N/A N/A Primary Etiology: Extremity Hypertension, Type II Diabetes, N/A N/A Comorbid History: Osteoarthritis, Neuropathy, Received Chemotherapy, Received Radiation 06/27/2022 N/A N/A Date AcquiredMEAGHEN, VECCHIARELLI ([id number]) [id number] Page 5 of 10 17 N/A N/A Weeks of Treatment: Open N/A N/A Wound Status: No N/A N/A Wound Recurrence: Yes N/A N/A Clustered Wound: 0.2x0.1x1.7 N/A N/A Measurements L x W x D (cm) 0.016 N/A N/A A (cm) : rea 0.027 N/A N/A Volume (cm) : 99.80% N/A N/A % Reduction in A rea: 99.70% N/A N/A % Reduction in Volume: Grade 2 N/A N/A Classification: Medium N/A N/A Exudate A mount:  Serosanguineous N/A N/A Exudate Type: red, brown N/A N/A Exudate Color: Thickened N/A N/A Wound Margin: Small (1-33%) N/A N/A Granulation A mount: Red N/A N/A Granulation Quality: Large (67-100%) N/A N/A Necrotic A mount: Eschar, Adherent Slough N/A N/A Necrotic Tissue: Fat Layer (Subcutaneous Tissue): Yes N/A N/A Exposed Structures: Fascia: No Tendon: No Muscle: No Joint: No Bone: No None N/A N/A Epithelialization: Treatment Notes Electronic Signature(s) Signed: 12/22/2022 5:10:32 PM By: Massie Kluver Entered By: Massie Kluver on 12/22/2022 14:31:22 -------------------------------------------------------------------------------- Multi-Disciplinary Care Plan Details Patient Name: Date of Service: [name] Puna [name]. 12/22/2022 2:00 PM Medical Record Number: [id number] Patient Account Number: [id number] Date of Birth/Sex: Treating RN: 1941/06/28 (82 y.o. [name] Primary Care Kaiulani Sitton: [name] Other Clinician:  Massie Kluver Referring Dierre Crevier: Treating Delshon Blanchfield/Extender: Laural Roes in Treatment: 17 Active Inactive Necrotic Tissue Nursing Diagnoses: Impaired tissue integrity related to necrotic/devitalized tissue Knowledge deficit related to management of necrotic/devitalized tissue Goals: Necrotic/devitalized tissue will be minimized in the wound bed Date Initiated: 08/25/2022 Target Resolution Date: 08/25/2022 Goal Status: Active Patient/caregiver will verbalize understanding of reason and process for debridement of necrotic tissue Date Initiated: 08/25/2022 Target Resolution Date: 08/25/2022 Goal Status: Active Interventions: Assess patient pain level pre-, during and post procedure and prior to discharge Provide education on necrotic tissue and debridement process Treatment Activities: [name], [name] ([id number]) [id number] Page 6 of 10 Excisional debridement : 08/25/2022 Notes: Pain, Acute or Chronic Nursing Diagnoses: Pain Management - Cyclic Acute (Dressing Change Related) Pain, acute or chronic: actual or potential Potential alteration in comfort, pain Goals: Patient will verbalize adequate pain control and receive pain control interventions during procedures as needed Date Initiated: 08/25/2022 Target Resolution Date: 08/25/2022 Goal Status: Active Patient/caregiver will verbalize adequate pain control between visits Date Initiated: 08/25/2022 Target Resolution Date: 08/25/2022 Goal Status: Active Patient/caregiver will verbalize comfort level met Date Initiated: 08/25/2022 Target Resolution Date: 08/25/2022 Goal Status: Active Interventions: Provide education on pain management Reposition patient for comfort Treatment Activities: Administer pain control measures as ordered : 08/25/2022 Notes: Peripheral Neuropathy Nursing Diagnoses: Knowledge deficit related to disease process and management of peripheral neurovascular  dysfunction Potential alteration in peripheral tissue perfusion (select prior to confirmation of diagnosis) Goals: Patient/caregiver will verbalize understanding of disease process and disease management Date Initiated: 08/25/2022 Target Resolution Date: 08/25/2022 Goal Status: Active Interventions: Assess signs and symptoms of neuropathy upon admission and as needed Provide education on Management of Neuropathy and Related Ulcers Notes: Wound/Skin Impairment Nursing Diagnoses: Impaired tissue integrity Knowledge deficit related to smoking impact on wound healing Knowledge deficit related to ulceration/compromised skin integrity Goals: Patient/caregiver will verbalize understanding of skin care regimen Date Initiated: 08/25/2022 Target Resolution Date: 08/25/2022 Goal Status: Active Ulcer/skin breakdown will have a volume reduction of 30% by week 4 Date Initiated: 08/25/2022 Date Inactivated: 11/17/2022 Target Resolution Date: 09/22/2022 Goal Status: Met Ulcer/skin breakdown will have a volume reduction of 50% by week 8 Date Initiated: 11/17/2022 Date Inactivated: 11/17/2022 Target Resolution Date: 10/20/2022 Goal Status: Met Ulcer/skin breakdown will have a volume reduction of 80% by week 12 Date Initiated: 11/17/2022 Date Inactivated: 11/17/2022 Target Resolution Date: 11/17/2022 Goal Status: Met Interventions: Assess ulceration(s) every visit Provide education on smoking Notes: Electronic Signature(s) Signed: 12/22/2022 5:10:32 PM By: Yates Decamp, Coralee Pesa ([id number]) [id number] Page 7 of 10 Signed: 12/23/2022 3:17:21 PM By: Gretta Cool, BSN, RN, CWS, Kim RN, BSN Entered By: Massie Kluver on 12/22/2022 14:37:19 -------------------------------------------------------------------------------- Pain Assessment Details Patient Name: Date of Service: [name] Puna [name]. 12/22/2022  2:00 PM Medical Record Number: [id number] Patient Account Number:  [id number] Date of Birth/Sex: Treating RN: [birth year] (82 y.o. [name] Primary Care Mithran Strike: [name] Other Clinician: Massie Kluver Referring Vasco Chong: Treating Jemmie Ledgerwood/Extender: Laural Roes in Treatment: 17 Active Problems Location of Pain Severity and Description of Pain Patient Has Paino No Site Locations Pain Management and Medication Current Pain Management: Electronic Signature(s) Signed: 12/22/2022 5:10:32 PM By: Massie Kluver Signed: 12/23/2022 3:17:21 PM By: Gretta Cool, BSN, RN, CWS, Kim RN, BSN Entered By: Massie Kluver on 12/22/2022 14:21:12 -------------------------------------------------------------------------------- Patient/Caregiver Education Details Patient Name: Date of Service: [name] 1/10/2024andnbsp2:00 PM Medical Record Number: [id number] Patient Account Number: [id number] [name], [name] ([id number]) [id number] Page 8 of 10 Date of Birth/Gender: Treating RN: 02/12/1941 (82 y.o. [name] Primary Care Physician: [name] Other Clinician: Massie Kluver Referring Physician: Treating Physician/Extender: Laural Roes in Treatment: 17 Education Assessment Education Provided To: Patient Education Topics Provided Wound/Skin Impairment: Handouts: Other: continue wound care as directed Methods: Explain/Verbal Responses: State content correctly Electronic Signature(s) Signed: 12/22/2022 5:10:32 PM By: Massie Kluver Entered By: Massie Kluver on 12/22/2022 14:37:13 -------------------------------------------------------------------------------- Wound Assessment Details Patient Name: Date of Service: [name] 12/22/2022 2:00 PM Medical Record Number: [id number] Patient Account Number: [id number] Date of Birth/Sex: Treating RN: September 27, 1941 (83 y.o. [name] Primary Care Valree Feild: [name] Other Clinician: Massie Kluver Referring  Yuridiana Formanek: Treating Jaedyn Marrufo/Extender: Laural Roes in Treatment: 17 Wound Status Wound Number: 1 Primary Diabetic Wound/Ulcer of the Lower Extremity Etiology: Wound Location: Right, Medial Lower Leg Wound Open Wounding Event: Trauma Status: Date Acquired: 06/27/2022 Comorbid Hypertension, Type II Diabetes, Osteoarthritis, Neuropathy, Weeks Of Treatment: 17 History: Received Chemotherapy, Received Radiation Clustered Wound: Yes Photos Wound Measurements Length: (cm) 0.2 Width: (cm) 0.1 Depth: (cm) 1.7 Area: (cm) 0. Volume: (cm) 0. [name], [name] ([id number]) % Reduction in Area: 99.8% % Reduction in Volume: 99.7% Epithelialization: None 016 027 [id number] Page 9 of 10 Wound Description Classification: Grade 2 Wound Margin: Thickened Exudate Amount: Medium Exudate Type: Serosanguineous Exudate Color: red, brown Foul Odor After Cleansing: No Slough/Fibrino Yes Wound Bed Granulation Amount: Small (1-33%) Exposed Structure Granulation Quality: Red Fascia Exposed: No Necrotic Amount: Large (67-100%) Fat Layer (Subcutaneous Tissue) Exposed: Yes Necrotic Quality: Eschar, Adherent Slough Tendon Exposed: No Muscle Exposed: No Joint Exposed: No Bone Exposed: No Treatment Notes Wound #1 (Lower Leg) Wound Laterality: Right, Medial Cleanser Dakin 16 (oz) 0.25 Discharge Instruction: Use as directed. Peri-Wound Care Topical Gentamicin Discharge Instruction: Apply very small amount to hydrofera rope and place into tunnels Mupirocin Ointment Discharge Instruction: Apply very small amount to hydrofera rope and place into tunnels Primary Dressing Endoform Natural, Non-fenestrated, 2x2 (in/in) Discharge Instruction: apply tiny amount of antibiotic ointment and lightly pack into tunnel Secondary Dressing Zetuvit Plus 4x8 (in/in) hydrofera blue rope Discharge Instruction: pack small piece into top of wound to keep  open Secured With Compression Wrap Medichoice 4 layer Compression System, 35-40 mmHG Discharge Instruction: Apply multi-layer wrap as directed. Compression Stockings Add-Ons Electronic Signature(s) Signed: 12/22/2022 5:10:32 PM By: Massie Kluver Signed: 12/23/2022 3:17:21 PM By: Gretta Cool, BSN, RN, CWS, Kim RN, BSN Entered By: Massie Kluver on 12/22/2022 14:29:36 -------------------------------------------------------------------------------- Vitals Details Patient Name: Date of Service: [name] Puna [name]. 12/22/2022 2:00 PM Medical Record Number: [id number] Patient Account Number: [id number] Date of Birth/Sex: Treating RN: [birth year] (82 y.o. [name], [place] ([id number]) [phone].pdf Page 10 of 10 Primary Care Temperance Kelemen: [name]  Other Clinician: Massie Kluver Referring Alisea Matte: Treating Khaleah Duer/Extender: Laural Roes in Treatment: 17 Vital Signs Time Taken: 14:17 Temperature (F): 98.1 Weight (lbs): 172 Pulse (bpm): 81 Respiratory Rate (breaths/min): 18 Blood Pressure (mmHg): 173/88 Reference Range: 80 - 120 mg / dl Electronic Signature(s) Signed: 12/22/2022 5:10:32 PM By: Massie Kluver Entered By: Massie Kluver on 12/22/2022 14:21:06

## 2022-12-28 ENCOUNTER — Other Ambulatory Visit: Payer: Self-pay | Admitting: Urology

## 2022-12-28 DIAGNOSIS — N3020 Other chronic cystitis without hematuria: Secondary | ICD-10-CM

## 2022-12-29 ENCOUNTER — Encounter (HOSPITAL_BASED_OUTPATIENT_CLINIC_OR_DEPARTMENT_OTHER): Payer: Medicare PPO | Admitting: Internal Medicine

## 2022-12-29 DIAGNOSIS — T798XXA Other early complications of trauma, initial encounter: Secondary | ICD-10-CM | POA: Diagnosis not present

## 2022-12-29 DIAGNOSIS — I87311 Chronic venous hypertension (idiopathic) with ulcer of right lower extremity: Secondary | ICD-10-CM | POA: Diagnosis not present

## 2022-12-29 DIAGNOSIS — L97818 Non-pressure chronic ulcer of other part of right lower leg with other specified severity: Secondary | ICD-10-CM

## 2022-12-29 DIAGNOSIS — E11622 Type 2 diabetes mellitus with other skin ulcer: Secondary | ICD-10-CM

## 2022-12-30 NOTE — Progress Notes (Signed)
NGA, RABON ([id number]) [id number] Page 1 of 8 Visit Report for 12/29/2022 Chief Complaint Document Details Patient Name: Date of Service: [name], [name] 12/29/2022 2:00 PM Medical Record Number: [id number] Patient Account Number: [id number] Date of Birth/Sex: Treating RN: [birth year] (82 y.o. [name] Primary Care Provider: [name] Other Clinician: Massie Kluver Referring Provider: Treating Provider/Extender: Laural Roes in Treatment: 18 Information Obtained from: Patient Chief Complaint 08/25/2022; right lower extremity wounds secondary to traumatic hematoma Electronic Signature(s) Signed: 12/29/2022 3:25:34 PM By: Kalman Shan DO Entered By: Kalman Shan on 12/29/2022 14:45:51 -------------------------------------------------------------------------------- HPI Details Patient Name: Date of Service: [name] Puna B. 12/29/2022 2:00 PM Medical Record Number: [id number] Patient Account Number: [id number] Date of Birth/Sex: Treating RN: [birth year] (82 y.o. [name] Primary Care Provider: [name] Other Clinician: Massie Kluver Referring Provider: Treating Provider/Extender: Laural Roes in Treatment: 18 History of Present Illness HPI Description: Admission 08/25/2022 [name] is an 82 year old female with a past medical history of breast cancer, with insulin dependent, controlled type 2 diabetes, chronic venous insufficiency that presents to the clinic for a 68-monthhistory of nonhealing ulcer to the right lower extremity. She states she hit her leg against a stair and developed a hematoma. The area was evacuated by her primary care physician. She has tried Neosporin and wet-to-dry dressings. She reports mild chronic pain to the area. She denies increased warmth, erythema or purulent drainage. 9/20; patient presents for follow-up. She had an x-ray of the right  leg done at last clinic visit that did not show Evidence of osteomyelitis. She has been doing Dakin's wet-to-dry dressings without issues. She denies signs of infection. 9/27; patient presents for follow-up. Hydrofera Blue with antibiotic ointment was used under Kerlix/Coban at last clinic visit. She tolerated this well. She has no issues or complaints today. She denies signs of infection. 10/4; patient presents for follow-up. We have been using Hydrofera Blue to the wound bed all under Kerlix/Coban. She has no issues or complaints today. She has tolerated this well. 10/11; patient presents for follow-up. We have been using Hydrofera Blue to the wound bed under Kerlix/Coban. She has tolerated this well and has no issues or complaints. ESUSY, PLACZEK([id number] Page 2 of 8 10/18; patient presents for follow-up. We have been using Hydrofera Blue with Santyl to the wound bed under Kerlix/Coban. She has no issues or complaints today. 10/25; this is a patient with a right lower extremity medial leg ulcer in the setting of fairly severe chronic venous insufficiency. She has been using Santyl Hydrofera Blue rope Hydrofera Blue under kerlix Coban. She presents with 3 tunneling areas in the wound. 11/1; this is a patient with a difficult right lower extremity medial leg ulcer in the setting of fairly clear chronic venous hypertension, hemosiderin deposition. This was initially traumatic in the setting of a fairly clear hematoma looking at cell phone pictures today. We have been using Hydrofera Blue under compression. 11/8; patient presents for follow-up. We have been using Hydrofera Blue blue under compression therapy. She has no issues or complaints today. Snap VAC was recommended at last clinic visit and this was run through insurance. We will ask for clarification on cost. 11/15; patient presents for follow-up. We have been using Hydrofera Blue with antibiotic  ointment under compression therapy. Patient has no issues or complaints today. 11/22; patient presents for follow-up. We have been using silver alginate with antibiotic ointment under compression therapy. Patient denies  signs of infection. 11/29; patient presents for follow-up. We have been using silver alginate with antibiotic ointment under compression therapy. One of the tunnels has healed up. She has no issues or complaints today. 12/6; patient presents for follow-up. We have been using silver alginate with antibiotic ointment under compression therapy. She has no issues or complaints today. 12/13; patient presents for follow-up. We have been using silver alginate with antibiotic ointment under compression therapy. She denies signs of infection. She hit her left leg against a box. She has some mild bruising. No open wound. 12/20; patient presents for follow-up. We have been using endoform with antibiotic ointment under compression therapy. Patient has no issues or complaints today. The ecchymosis has resolved to the left lower extremity. 12/27. We have been using endoform with coating antibiotics superficial cover of Hydrofera Blue under 4-layer compression she has 2.5 cm of tunneling depth is measured by myself today. She is completing clindamycin complains that made her ill but no complaints of diarrhea. We looked over pictures on her for family's phone at the time this was done this was fairly clearly a hematoma 1/3; patient presents for follow-up. We have been using endoform with topical antibiotics and superficial culture of Hydrofera Blue under 4-layer compression. She has no issues or complaints today. 1/10; patient presents for follow-up. We have been using endoform with topical antibiotic and superficial cover of Hydrofera Blue under 4-layer compression. She has no issues or complaints today. There is been improvement in wound healing. 1/17; patient presents for follow-up. We continue  to use endoform with topical antibiotic and superficial covered Hydrofera Blue under 4-layer compression. She has no issues or complaints today. Electronic Signature(s) Signed: 12/29/2022 3:25:34 PM By: Kalman Shan DO Entered By: Kalman Shan on 12/29/2022 14:46:15 -------------------------------------------------------------------------------- Physical Exam Details Patient Name: Date of Service: [name] 12/29/2022 2:00 PM Medical Record Number: [id number] Patient Account Number: [id number] Date of Birth/Sex: Treating RN: 11-Mar-1941 (82 y.o. [name] Primary Care Provider: [name] Other Clinician: Massie Kluver Referring Provider: Treating Provider/Extender: Laural Roes in Treatment: 18 Constitutional . Cardiovascular . Psychiatric . Notes NATE, PERRI ([id number]) [id number] Page 3 of 8 Right lower extremity: T the medial aspect there is a small wound with depth of 1.6 cm. No signs of infection. Good edema control. Venous stasis dermatitis. o Electronic Signature(s) Signed: 12/29/2022 3:25:34 PM By: Kalman Shan DO Entered By: Kalman Shan on 12/29/2022 14:49:41 -------------------------------------------------------------------------------- Physician Orders Details Patient Name: Date of Service: [name] Puna B. 12/29/2022 2:00 PM Medical Record Number: [id number] Patient Account Number: [id number] Date of Birth/Sex: Treating RN: [birth year] (82 y.o. [name] Primary Care Provider: [name] Other Clinician: Massie Kluver Referring Provider: Treating Provider/Extender: Laural Roes in Treatment: 15 Verbal / Phone Orders: No Diagnosis Coding Follow-up Appointments Wound #1 Right,Medial Lower Leg Return Appointment in 1 week. Nurse Visit as needed Bathing/ Shower/ Hygiene May shower with wound dressing protected with water repellent cover or  cast protector. No tub bath. Anesthetic (Use 'Patient Medications' Section for Anesthetic Order Entry) Lidocaine applied to wound bed Edema Control - Lymphedema / Segmental Compressive Device / Other Tubigrip single layer applied. - Tubi D single left lower leg Elevate legs to the level of the heart and pump ankles as often as possible Medications-Please add to medication list. ntibiotic - Gentamycin and Mupirocin Topical A Wound Treatment Wound #1 - Lower Leg Wound Laterality: Right, Medial Cleanser: Dakin 16 (oz) 0.25 1 x Per  Week/30 Days Discharge Instructions: Use as directed. Topical: Gentamicin 1 x Per Week/30 Days Discharge Instructions: Apply very small amount to hydrofera rope and place into tunnels Topical: Mupirocin Ointment 1 x Per Week/30 Days Discharge Instructions: Apply very small amount to hydrofera rope and place into tunnels Prim Dressing: Endoform Natural, Non-fenestrated, 2x2 (in/in) 1 x Per Week/30 Days ary Discharge Instructions: apply tiny amount of antibiotic ointment and lightly pack into tunnel Secondary Dressing: Zetuvit Plus 4x8 (in/in) 1 x Per Week/30 Days Compression Wrap: Medichoice 4 layer Compression System, 35-40 mmHG 1 x Per Week/30 Days Discharge Instructions: Apply multi-layer wrap as directed. Electronic Signature(s) Signed: 12/29/2022 3:25:34 PM By: Kalman Shan DO Entered By: Kalman Shan on 12/29/2022 14:53:17 Marlena Clipper B ([id number]) [id number] Page 4 of 8 -------------------------------------------------------------------------------- Problem List Details Patient Name: Date of Service: ASHELYNN, MARKS 12/29/2022 2:00 PM Medical Record Number: [id number] Patient Account Number: [id number] Date of Birth/Sex: Treating RN: [birth year] (82 y.o. [name] Primary Care Provider: [name] Other Clinician: Massie Kluver Referring Provider: Treating Provider/Extender: Laural Roes in Treatment: 18 Active Problems ICD-10 Encounter Code Description Active Date MDM Diagnosis L97.818 Non-pressure chronic ulcer of other part of right lower leg with other specified 08/25/2022 No Yes severity T79.8XXA Other early complications of trauma, initial encounter 08/25/2022 No Yes T79.2XXA Traumatic secondary and recurrent hemorrhage and seroma, initial encounter 08/25/2022 No Yes E11.622 Type 2 diabetes mellitus with other skin ulcer 08/25/2022 No Yes E03.9 Hypothyroidism, unspecified 08/25/2022 No Yes I10 Essential (primary) hypertension 08/25/2022 No Yes I87.311 Chronic venous hypertension (idiopathic) with ulcer of right lower extremity 08/25/2022 No Yes Inactive Problems Resolved Problems Electronic Signature(s) Signed: 12/29/2022 3:25:34 PM By: Kalman Shan DO Entered By: Kalman Shan on 12/29/2022 14:45:22 Hittle, Kamira B ([id number]) [id number] Page 5 of 8 -------------------------------------------------------------------------------- Progress Note Details Patient Name: Date of Service: ABRIE, EGLOFF 12/29/2022 2:00 PM Medical Record Number: [id number] Patient Account Number: [id number] Date of Birth/Sex: Treating RN: 10-19-1941 (82 y.o. [name] Primary Care Provider: [name] Other Clinician: Massie Kluver Referring Provider: Treating Provider/Extender: Laural Roes in Treatment: 18 Subjective Chief Complaint Information obtained from Patient 08/25/2022; right lower extremity wounds secondary to traumatic hematoma History of Present Illness (HPI) Admission 08/25/2022 Ms. Chalyn Amescua is an 82 year old female with a past medical history of breast cancer, with insulin dependent, controlled type 2 diabetes, chronic venous insufficiency that presents to the clinic for a 58-monthhistory of nonhealing ulcer to the right lower extremity. She states she hit her leg against a stair  and developed a hematoma. The area was evacuated by her primary care physician. She has tried Neosporin and wet-to-dry dressings. She reports mild chronic pain to the area. She denies increased warmth, erythema or purulent drainage. 9/20; patient presents for follow-up. She had an x-ray of the right leg done at last clinic visit that did not show Evidence of osteomyelitis. She has been doing Dakin's wet-to-dry dressings without issues. She denies signs of infection. 9/27; patient presents for follow-up. Hydrofera Blue with antibiotic ointment was used under Kerlix/Coban at last clinic visit. She tolerated this well. She has no issues or complaints today. She denies signs of infection. 10/4; patient presents for follow-up. We have been using Hydrofera Blue to the wound bed all under Kerlix/Coban. She has no issues or complaints today. She has tolerated this well. 10/11; patient presents for follow-up. We have been using Hydrofera Blue to the wound bed under Kerlix/Coban. She has tolerated  this well and has no issues or complaints. 10/18; patient presents for follow-up. We have been using Hydrofera Blue with Santyl to the wound bed under Kerlix/Coban. She has no issues or complaints today. 10/25; this is a patient with a right lower extremity medial leg ulcer in the setting of fairly severe chronic venous insufficiency. She has been using Santyl Hydrofera Blue rope Hydrofera Blue under kerlix Coban. She presents with 3 tunneling areas in the wound. 11/1; this is a patient with a difficult right lower extremity medial leg ulcer in the setting of fairly clear chronic venous hypertension, hemosiderin deposition. This was initially traumatic in the setting of a fairly clear hematoma looking at cell phone pictures today. We have been using Hydrofera Blue under compression. 11/8; patient presents for follow-up. We have been using Hydrofera Blue blue under compression therapy. She has no issues or  complaints today. Snap VAC was recommended at last clinic visit and this was run through insurance. We will ask for clarification on cost. 11/15; patient presents for follow-up. We have been using Hydrofera Blue with antibiotic ointment under compression therapy. Patient has no issues or complaints today. 11/22; patient presents for follow-up. We have been using silver alginate with antibiotic ointment under compression therapy. Patient denies signs of infection. 11/29; patient presents for follow-up. We have been using silver alginate with antibiotic ointment under compression therapy. One of the tunnels has healed up. She has no issues or complaints today. 12/6; patient presents for follow-up. We have been using silver alginate with antibiotic ointment under compression therapy. She has no issues or complaints today. 12/13; patient presents for follow-up. We have been using silver alginate with antibiotic ointment under compression therapy. She denies signs of infection. She hit her left leg against a box. She has some mild bruising. No open wound. 12/20; patient presents for follow-up. We have been using endoform with antibiotic ointment under compression therapy. Patient has no issues or complaints today. The ecchymosis has resolved to the left lower extremity. 12/27. We have been using endoform with coating antibiotics superficial cover of Hydrofera Blue under 4-layer compression she has 2.5 cm of tunneling depth is measured by myself today. She is completing clindamycin complains that made her ill but no complaints of diarrhea. We looked over pictures on her for family's phone at the time this was done this was fairly clearly a hematoma 1/3; patient presents for follow-up. We have been using endoform with topical antibiotics and superficial culture of Hydrofera Blue under 4-layer compression. She has no issues or complaints today. 1/10; patient presents for follow-up. We have been using  endoform with topical antibiotic and superficial cover of Hydrofera Blue under 4-layer compression. She has no issues or complaints today. There is been improvement in wound healing. DIOR, DOMINIK ([id number]) [id number] Page 6 of 8 1/17; patient presents for follow-up. We continue to use endoform with topical antibiotic and superficial covered Hydrofera Blue under 4-layer compression. She has no issues or complaints today. Objective Constitutional Vitals Time Taken: 2:17 PM, Weight: 172 lbs, Temperature: 97.7 F, Pulse: 73 bpm, Respiratory Rate: 18 breaths/min, Blood Pressure: 156/83 mmHg. General Notes: Right lower extremity: T the medial aspect there is a small wound with depth of 1.6 cm. No signs of infection. Good edema control. Venous o stasis dermatitis. Integumentary (Hair, Skin) Wound #1 status is Open. Original cause of wound was Trauma. The date acquired was: 06/27/2022. The wound has been in treatment 18 weeks. The wound is located on the Right,Medial Lower  Leg. The wound measures 0.1cm length x 0.1cm width x 1.6cm depth; 0.008cm^2 area and 0.013cm^3 volume. There is Fat Layer (Subcutaneous Tissue) exposed. There is a medium amount of serosanguineous drainage noted. The wound margin is thickened. There is small (1-33%) red granulation within the wound bed. There is a large (67-100%) amount of necrotic tissue within the wound bed including Eschar and Adherent Slough. 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 At this time  I recommended endoform and antibiotic ointment under 4-layer compression. Follow-up in 1 week. Procedures Wound #1 Pre-procedure diagnosis of Wound #1 is a Diabetic Wound/Ulcer of the Lower Extremity located on the Right,Medial Lower Leg . There was a Four Layer Compression Therapy Procedure with a pre-treatment ABI of 0.9 by Massie Kluver. Post procedure Diagnosis Wound #1: Same as Pre-Procedure Plan Follow-up Appointments: Wound #1 Right,Medial Lower Leg: Return Appointment in 1 week. Nurse Visit as needed Bathing/ Shower/ Hygiene: May shower with wound dressing protected with water repellent cover or cast protector. No tub bath. Anesthetic (Use 'Patient Medications' Section for Anesthetic Order Entry): Lidocaine applied to wound bed Edema Control - Lymphedema / Segmental Compressive Device / Other: Tubigrip single layer applied. - Tubi D single left lower leg Elevate legs to the level of the heart and pump ankles as often as possible Medications-Please add to medication list.: Topical Antibiotic - Gentamycin and Mupirocin WOUND #1: - Lower Leg Wound Laterality: Right, Medial Cleanser: Dakin 16 (oz) 0.25 1 x Per Week/30 Days Discharge Instructions: Use as directed. Topical: Gentamicin 1 x Per Week/30 Days Discharge Instructions: Apply very small amount to hydrofera rope and place into tunnels Topical: Mupirocin Ointment 1 x Per Week/30 Days Discharge Instructions: Apply very small amount to hydrofera rope and place into tunnels KINDRA, BICKHAM B ([id number]) [id number] Page 7 of 8 Prim Dressing: Endoform Natural, Non-fenestrated, 2x2 (in/in) 1 x Per Week/30 Days ary Discharge Instructions: apply tiny amount of antibiotic ointment and lightly pack into tunnel Secondary Dressing: Zetuvit Plus 4x8 (in/in) 1 x Per Week/30 Days Com pression Wrap: Medichoice 4 layer Compression System, 35-40 mmHG 1 x Per Week/30 Days Discharge Instructions: Apply multi-layer  wrap as directed. 1. Endoform with antibiotic ointment under 4-layer compressionooright lower extremity 2. Follow-up in 1 week Electronic Signature(s) Signed: 12/29/2022 3:25:34 PM By: Kalman Shan DO Entered By: Kalman Shan on 12/29/2022 14:52:43 -------------------------------------------------------------------------------- ROS/PFSH Details Patient Name: Date of Service: [name] Puna B. 12/29/2022 2:00 PM Medical Record Number: [id number] Patient Account Number: [id number] Date of Birth/Sex: Treating RN: Mar 15, 1941 (82 y.o. [name] Primary Care Provider: [name] Other Clinician: Massie Kluver Referring Provider: Treating Provider/Extender: Laural Roes in Treatment: 18 Cardiovascular Medical History: Positive for: Hypertension Endocrine Medical History: Positive for: Type II Diabetes Time with diabetes: 40+ Treated with: Insulin Blood sugar tested every day: Yes Tested : 67 Musculoskeletal Medical History: Positive for: Osteoarthritis - Shoulders Neurologic Medical History: Positive for: Neuropathy Oncologic Medical History: Positive for: Received Chemotherapy; Received Radiation Immunizations Pneumococcal Vaccine: Received Pneumococcal Vaccination: Yes Received Pneumococcal Vaccination On or After 60th Birthday: Yes Implantable Devices None Family and Social History MIRELA, PARSLEY ([id number]) [id number] Page 8 of 8 Never smoker; Marital Status -  Married; Alcohol Use: Never; Drug Use: No History; Caffeine Use: Daily Electronic Signature(s) Signed: 12/29/2022 3:25:34 PM By: Kalman Shan DO Signed: 12/29/2022 11:43:47 PM By: Gretta Cool, BSN, RN, CWS, Kim RN, BSN Entered By: Kalman Shan on 12/29/2022 14:53:28 -------------------------------------------------------------------------------- SuperBill Details Patient Name: Date of Service: [name] 12/29/2022 Medical Record  Number: [id number] Patient Account Number: [id number] Date of Birth/Sex: Treating RN: [birth year] (82 y.o. [name] Primary Care Provider: [name] Other Clinician: Massie Kluver Referring Provider: Treating Provider/Extender: Laural Roes in Treatment: 18 Diagnosis Coding ICD-10 Codes Code Description [phone] Non-pressure chronic ulcer of other part of right lower leg with other specified severity T79.8XXA Other early complications of trauma, initial encounter T67.2XXA Traumatic secondary and recurrent hemorrhage and seroma, initial encounter E11.622 Type 2 diabetes mellitus with other skin ulcer E03.9 Hypothyroidism, unspecified I10 Essential (primary) hypertension I87.311 Chronic venous hypertension (idiopathic) with ulcer of right lower extremity Facility Procedures : CPT4 Code: [id number] Description: (Facility Use Only) [phone] - APPLY MULTLAY COMPRS LWR RT LEG ICD-10 Diagnosis Description L97.818 Non-pressure chronic ulcer of other part of right lower leg with other specified sever Modifier: ity Quantity: 1 Physician Procedures : CPT4 Code Description Modifier [id number] - WC PHYS LEVEL 3 - EST PT ICD-10 Diagnosis Description L97.818 Non-pressure chronic ulcer of other part of right lower leg with other specified severity T79.8XXA Other early complications of trauma, initial  encounter I87.311 Chronic venous hypertension (idiopathic) with ulcer of right lower extremity E11.622 Type 2 diabetes mellitus with other skin ulcer Quantity: 1 Electronic Signature(s) Signed: 12/29/2022 3:25:34 PM By: Kalman Shan DO Entered By: Kalman Shan on 12/29/2022 14:53:00

## 2022-12-31 NOTE — Progress Notes (Signed)
[name], [name] ([id number]) [id number] Page 1 of 10 Visit Report for 12/29/2022 Arrival Information Details Patient Name: Date of Service: [name], [name] 12/29/2022 2:00 PM Medical Record Number: [id number] Patient Account Number: [id number] Date of Birth/Sex: Treating RN: [birth year] (82 y.o. [name] Primary Care [name]: [name] Other Clinician: Massie [name] Referring [name]: Treating [name]/Extender: [name] in Treatment: 18 Visit Information History Since Last Visit All ordered tests and consults were completed: No Patient Arrived: Ambulatory Added or deleted any medications: No Arrival Time: 14:13 Any new allergies or adverse reactions: No Transfer Assistance: None Had a fall or experienced change in No Patient Identification Verified: Yes activities of daily living that may affect Secondary Verification Process Completed: Yes risk of falls: Patient Requires Transmission-Based Precautions: No Signs or symptoms of abuse/neglect since last visito No Patient Has Alerts: Yes Hospitalized since last visit: No Patient Alerts: Patient on Blood Thinner Implantable device outside of the clinic excluding No '81mg'$  aspirin cellular tissue based products placed in the center Type II Diabetic since last visit: Has Dressing in Place as Prescribed: Yes Has Compression in Place as Prescribed: Yes Pain Present Now: Yes Electronic Signature(s) Signed: 12/31/2022 9:50:53 AM By: [name] Entered By: [name] on 12/29/2022 14:15:40 -------------------------------------------------------------------------------- Clinic Level of Care Assessment Details Patient Name: Date of Service: [name], [name] 12/29/2022 2:00 PM Medical Record Number: [id number] Patient Account Number: [id number] Date of Birth/Sex: Treating RN: [birth year] (82 y.o. [name] Primary Care [name]: [name] Other  Clinician: Massie [name] Referring [name]: Treating [name]/Extender: [name] in Treatment: 18 Clinic Level of Care Assessment Items TOOL 1 Quantity Score '[]'$  - 0 Use when EandM and Procedure is performed on INITIAL visit ASSESSMENTS - Nursing Assessment / Reassessment '[]'$  - 0 General Physical Exam (combine w/ comprehensive assessment (listed just below) when performed on new pt. evalsSTEPHENY, [name] ([id number]) [id number] Page 2 of 10 '[]'$  - 0 Comprehensive Assessment (HX, ROS, Risk Assessments, Wounds Hx, etc.) ASSESSMENTS - Wound and Skin Assessment / Reassessment '[]'$  - 0 Dermatologic / Skin Assessment (not related to wound area) ASSESSMENTS - Ostomy and/or Continence Assessment and Care '[]'$  - 0 Incontinence Assessment and Management '[]'$  - 0 Ostomy Care Assessment and Management (repouching, etc.) PROCESS - Coordination of Care '[]'$  - 0 Simple Patient / Family Education for ongoing care '[]'$  - 0 Complex (extensive) Patient / Family Education for ongoing care '[]'$  - 0 Staff obtains Programmer, systems, Records, T Results / Process Orders est '[]'$  - 0 Staff telephones HHA, Nursing Homes / Clarify orders / etc '[]'$  - 0 Routine Transfer to another Facility (non-emergent condition) '[]'$  - 0 Routine Hospital Admission (non-emergent condition) '[]'$  - 0 New Admissions / Biomedical engineer / Ordering NPWT Apligraf, etc. , '[]'$  - 0 Emergency Hospital Admission (emergent condition) PROCESS - Special Needs '[]'$  - 0 Pediatric / Minor Patient Management '[]'$  - 0 Isolation Patient Management '[]'$  - 0 Hearing / Language / Visual special needs '[]'$  - 0 Assessment of Community assistance (transportation, D/C planning, etc.) '[]'$  - 0 Additional assistance / Altered mentation '[]'$  - 0 Support Surface(s) Assessment (bed, cushion, seat, etc.) INTERVENTIONS - Miscellaneous '[]'$  - 0 External ear exam '[]'$  - 0 Patient Transfer (multiple staff / Civil Service fast streamer /  Similar devices) '[]'$  - 0 Simple Staple / Suture removal (25 or less) '[]'$  - 0 Complex Staple / Suture removal (26 or more) '[]'$  - 0 Hypo/Hyperglycemic Management (do not check if billed separately) '[]'$  -  0 Ankle / Brachial Index (ABI) - do not check if billed separately Has the patient been seen at the hospital within the last three years: Yes Total Score: 0 Level Of Care: ____ Electronic Signature(s) Signed: 12/31/2022 9:50:53 AM By: [name] Entered By: [name] on 12/29/2022 14:42:10 -------------------------------------------------------------------------------- Compression Therapy Details Patient Name: Date of Service: [name] Puna [name]. 12/29/2022 2:00 PM Medical Record Number: [id number] Patient Account Number: [id number] Date of Birth/Sex: Treating RN: Jul 23, 1941 (82 y.o. [name] Primary Care Sante Biedermann: [name] Other Clinician: Neziah, Vogelgesang [name] ([id number]) [phone].pdf Page 3 of 10 Referring [name]: Treating [name]/Extender: [name] in Treatment: 18 Compression Therapy Performed for Wound Assessment: Wound #1 Right,Medial Lower Leg Performed By: [name], [name], Compression Type: Four Layer Pre Treatment ABI: 0.9 Post Procedure Diagnosis Same as Pre-procedure Electronic Signature(s) Signed: 12/31/2022 9:50:53 AM By: [name] Entered By: [name] on 12/29/2022 14:38:52 -------------------------------------------------------------------------------- Encounter Discharge Information Details Patient Name: Date of Service: [name] Puna [name]. 12/29/2022 2:00 PM Medical Record Number: [id number] Patient Account Number: [id number] Date of Birth/Sex: Treating RN: 03-15-1941 (82 y.o. [name] Primary Care Lanyla Costello: [name] Other Clinician: Massie [name] Referring Hodges Treiber: Treating Seline Enzor/Extender: [name] in Treatment:  18 Encounter Discharge Information Items Discharge Condition: Stable Ambulatory Status: Ambulatory Discharge Destination: Home Transportation: Private Auto Accompanied By: daughter Schedule Follow-up Appointment: Yes Clinical Summary of Care: Electronic Signature(s) Signed: 12/31/2022 9:50:53 AM By: [name] Entered By: [name] on 12/29/2022 16:24:40 -------------------------------------------------------------------------------- Lower Extremity Assessment Details Patient Name: Date of Service: [name], [name] 12/29/2022 2:00 PM Medical Record Number: [id number] Patient Account Number: [id number] Date of Birth/Sex: Treating RN: 08-07-1941 (82 y.o. [name] Primary Care Korayma Hagwood: [name] Other Clinician: Massie [name] Referring Desirea Mizrahi: Treating Keifer Habib/Extender: [name] in Treatment: [address] [place] ([id number]) [id number] Page 4 of 10 Edema Assessment Assessed: [Left: No] [Right: Yes] Edema: [Left: Ye] [Right: s] Calf Left: Right: Point of Measurement: 30 cm From Medial Instep 32.5 cm Ankle Left: Right: Point of Measurement: 10 cm From Medial Instep 20.4 cm Vascular Assessment Pulses: Dorsalis Pedis Palpable: [Right:Yes] Electronic Signature(s) Signed: 12/29/2022 11:43:47 PM By: Gretta Cool, BSN, RN, CWS, Kim RN, BSN Signed: 12/31/2022 9:50:53 AM By: [name] Entered By: [name] on 12/29/2022 14:34:01 -------------------------------------------------------------------------------- Multi Wound Chart Details Patient Name: Date of Service: [name] Puna [name]. 12/29/2022 2:00 PM Medical Record Number: [id number] Patient Account Number: [id number] Date of Birth/Sex: Treating RN: [birth year] (81 y.o. [name] Primary Care Aniel Hubble: [name] Other Clinician: Massie [name] Referring Jeshurun Oax[place]: Treating Zyiere Rosemond/Extender: [name] in Treatment:  18 Vital Signs Height(in): Pulse(bpm): 73 Weight(lbs): 172 Blood Pressure(mmHg): 156/83 Body Mass Index(BMI): Temperature(F): 97.7 Respiratory Rate(breaths/min): 18 [1:Photos:] [N/A:N/A] Right, Medial Lower Leg N/A N/A Wound Location: Trauma N/A N/A Wounding Event: Diabetic Wound/Ulcer of the Lower N/A N/A Primary Etiology: Extremity Hypertension, Type II Diabetes, N/A N/A Comorbid History: Osteoarthritis, Neuropathy, Received Chemotherapy, Received Radiation 06/27/2022 N/A N/A Date AcquiredLEAN, [name] ([id number]) [id number] Page 5 of 10 18 N/A N/A Weeks of Treatment: Open N/A N/A Wound Status: No N/A N/A Wound Recurrence: Yes N/A N/A Clustered Wound: 0.1x0.1x1.6 N/A N/A Measurements L x W x D (cm) 0.008 N/A N/A A (cm) : rea 0.013 N/A N/A Volume (cm) : 99.90% N/A N/A % Reduction in A rea: 99.90% N/A N/A % Reduction in Volume: Grade 2 N/A N/A Classification: Medium N/A N/A Exudate A mount:  Serosanguineous N/A N/A Exudate Type: red, brown N/A N/A Exudate Color: Thickened N/A N/A Wound Margin: Small (1-33%) N/A N/A Granulation A mount: Red N/A N/A Granulation Quality: Large (67-100%) N/A N/A Necrotic A mount: Eschar, Adherent Slough N/A N/A Necrotic Tissue: Fat Layer (Subcutaneous Tissue): Yes N/A N/A Exposed Structures: Fascia: No Tendon: No Muscle: No Joint: No Bone: No None N/A N/A Epithelialization: Treatment Notes Electronic Signature(s) Signed: 12/31/2022 9:50:53 AM By: [name] Entered By: [name] on 12/29/2022 14:34:08 -------------------------------------------------------------------------------- Multi-Disciplinary Care Plan Details Patient Name: Date of Service: [name] Puna [name]. 12/29/2022 2:00 PM Medical Record Number: [id number] Patient Account Number: [id number] Date of Birth/Sex: Treating RN: January 28, 1941 (82 y.o. [name] Primary Care Jatoria Kneeland: [name] Other  Clinician: Massie [name] Referring Jill Ruppe: Treating Avanish Cerullo/Extender: [name] in Treatment: 18 Active Inactive Necrotic Tissue Nursing Diagnoses: Impaired tissue integrity related to necrotic/devitalized tissue Knowledge deficit related to management of necrotic/devitalized tissue Goals: Necrotic/devitalized tissue will be minimized in the wound bed Date Initiated: 08/25/2022 Target Resolution Date: 08/25/2022 Goal Status: Active Patient/caregiver will verbalize understanding of reason and process for debridement of necrotic tissue Date Initiated: 08/25/2022 Target Resolution Date: 08/25/2022 Goal Status: Active Interventions: Assess patient pain level pre-, during and post procedure and prior to discharge Provide education on necrotic tissue and debridement process Treatment Activities: [name], [name] ([id number]) [id number] Page 6 of 10 Excisional debridement : 08/25/2022 Notes: Pain, Acute or Chronic Nursing Diagnoses: Pain Management - Cyclic Acute (Dressing Change Related) Pain, acute or chronic: actual or potential Potential alteration in comfort, pain Goals: Patient will verbalize adequate pain control and receive pain control interventions during procedures as needed Date Initiated: 08/25/2022 Target Resolution Date: 08/25/2022 Goal Status: Active Patient/caregiver will verbalize adequate pain control between visits Date Initiated: 08/25/2022 Target Resolution Date: 08/25/2022 Goal Status: Active Patient/caregiver will verbalize comfort level met Date Initiated: 08/25/2022 Target Resolution Date: 08/25/2022 Goal Status: Active Interventions: Provide education on pain management Reposition patient for comfort Treatment Activities: Administer pain control measures as ordered : 08/25/2022 Notes: Peripheral Neuropathy Nursing Diagnoses: Knowledge deficit related to disease process and management of peripheral  neurovascular dysfunction Potential alteration in peripheral tissue perfusion (select prior to confirmation of diagnosis) Goals: Patient/caregiver will verbalize understanding of disease process and disease management Date Initiated: 08/25/2022 Target Resolution Date: 08/25/2022 Goal Status: Active Interventions: Assess signs and symptoms of neuropathy upon admission and as needed Provide education on Management of Neuropathy and Related Ulcers Notes: Wound/Skin Impairment Nursing Diagnoses: Impaired tissue integrity Knowledge deficit related to smoking impact on wound healing Knowledge deficit related to ulceration/compromised skin integrity Goals: Patient/caregiver will verbalize understanding of skin care regimen Date Initiated: 08/25/2022 Target Resolution Date: 08/25/2022 Goal Status: Active Ulcer/skin breakdown will have a volume reduction of 30% by week 4 Date Initiated: 08/25/2022 Date Inactivated: 11/17/2022 Target Resolution Date: 09/22/2022 Goal Status: Met Ulcer/skin breakdown will have a volume reduction of 50% by week 8 Date Initiated: 11/17/2022 Date Inactivated: 11/17/2022 Target Resolution Date: 10/20/2022 Goal Status: Met Ulcer/skin breakdown will have a volume reduction of 80% by week 12 Date Initiated: 11/17/2022 Date Inactivated: 11/17/2022 Target Resolution Date: 11/17/2022 Goal Status: Met Interventions: Assess ulceration(s) every visit Provide education on smoking Notes: Electronic Signature(s) Signed: 12/29/2022 11:43:47 PM By: Gretta Cool, BSN, RN, CWS, Kim RN, BSN [place], [place] [name] ([id number]) [phone].pdf Page 7 of 10 Signed: 12/31/2022 9:50:53 AM By: [name] Entered By: [name] on 12/29/2022 16:24:08 -------------------------------------------------------------------------------- Pain Assessment Details Patient Name: Date of Service: [name] Puna [name]. 12/29/2022  2:00 PM Medical Record Number: [id number] Patient Account  Number: [id number] Date of Birth/Sex: Treating RN: 1941/05/22 (82 y.o. [name] Primary Care Davianna Deutschman: [name] Other Clinician: Massie [name] Referring Jazell Rosenau: Treating Keelon Zurn/Extender: [name] in Treatment: 18 Active Problems Location of Pain Severity and Description of Pain Patient Has Paino Yes Site Locations Pain Location: Generalized Pain Duration of the Pain. Constant / Intermittento Intermittent Rate the pain. Current Pain Level: 0 Worst Pain Level: 6 Least Pain Level: 5 Character of Pain Describe the Pain: Sharp Pain Management and Medication Current Pain Management: Medication: No Cold Application: No Rest: No Massage: No Activity: No T.E.N.S.: No Heat Application: No Leg drop or elevation: No Is the Current Pain Management Adequate: Inadequate How does your wound impact your activities of daily livingo Sleep: No Bathing: No Appetite: No Relationship With Others: No Bladder Continence: No Emotions: No Bowel Continence: No Work: No Toileting: No Drive: No Dressing: No Hobbies: No Engineer, maintenance) Signed: 12/29/2022 11:43:47 PM By: Gretta Cool, BSN, RN, CWS, Kim RN, BSN Signed: 12/31/2022 9:50:53 AM By: [name] Entered By: [name] on 12/29/2022 14:21:06 [name] ([id number]) [id number] Page 8 of 10 -------------------------------------------------------------------------------- Patient/Caregiver Education Details Patient Name: Date of Service: [name], [name] 1/17/2024andnbsp2:00 PM Medical Record Number: [id number] Patient Account Number: [id number] Date of Birth/Gender: Treating RN: [birth year] (82 y.o. [name] Primary Care Physician: [name] Other Clinician: Massie [name] Referring Physician: Treating Physician/Extender: [name] in Treatment: 18 Education Assessment Education Provided To: Patient Education  Topics Provided Wound/Skin Impairment: Handouts: Other: continue wound care as directed Methods: Explain/Verbal Responses: State content correctly Electronic Signature(s) Signed: 12/31/2022 9:50:53 AM By: [name] Entered By: [name] on 12/29/2022 16:23:59 -------------------------------------------------------------------------------- Wound Assessment Details Patient Name: Date of Service: [name] Puna [name]. 12/29/2022 2:00 PM Medical Record Number: [id number] Patient Account Number: [id number] Date of Birth/Sex: Treating RN: [birth year] (82 y.o. [name] Primary Care Kleber Crean: [name] Other Clinician: Massie [name] Referring Adrianne Shackleton: Treating Zacharey Jensen/Extender: [name] in Treatment: 18 Wound Status Wound Number: 1 Primary Diabetic Wound/Ulcer of the Lower Extremity Etiology: Wound Location: Right, Medial Lower Leg Wound Open Wounding Event: Trauma Status: Date Acquired: 06/27/2022 Comorbid Hypertension, Type II Diabetes, Osteoarthritis, Neuropathy, Weeks Of Treatment: 18 History: Received Chemotherapy, Received Radiation Clustered Wound: Yes Photos [name], [name] ([id number]) [id number] Page 9 of 10 Wound Measurements Length: (cm) 0.1 Width: (cm) 0.1 Depth: (cm) 1.6 Area: (cm) 0.008 Volume: (cm) 0.013 % Reduction in Area: 99.9% % Reduction in Volume: 99.9% Epithelialization: None Wound Description Classification: Grade 2 Wound Margin: Thickened Exudate Amount: Medium Exudate Type: Serosanguineous Exudate Color: red, brown Foul Odor After Cleansing: No Slough/Fibrino Yes Wound Bed Granulation Amount: Small (1-33%) Exposed Structure Granulation Quality: Red Fascia Exposed: No Necrotic Amount: Large (67-100%) Fat Layer (Subcutaneous Tissue) Exposed: Yes Necrotic Quality: Eschar, Adherent Slough Tendon Exposed: No Muscle Exposed: No Joint Exposed: No Bone Exposed: No Treatment  Notes Wound #1 (Lower Leg) Wound Laterality: Right, Medial Cleanser Dakin 16 (oz) 0.25 Discharge Instruction: Use as directed. Peri-Wound Care Topical Gentamicin Discharge Instruction: Apply very small amount to hydrofera rope and place into tunnels Mupirocin Ointment Discharge Instruction: Apply very small amount to hydrofera rope and place into tunnels Primary Dressing Endoform Natural, Non-fenestrated, 2x2 (in/in) Discharge Instruction: apply tiny amount of antibiotic ointment and lightly pack into tunnel Secondary Dressing Zetuvit Plus 4x8 (in/in) Secured With Compression Wrap Medichoice 4 layer Compression System, 35-40 mmHG Discharge Instruction: Apply  multi-layer wrap as directed. Compression Stockings Add-Ons Electronic Signature(s) Signed: 12/29/2022 11:43:47 PM By: Gretta Cool, BSN, RN, CWS, Kim RN, BSN Signed: 12/31/2022 9:50:53 AM By: [name] Entered By: [name] on 12/29/2022 14:33:03 [name] ([id number]) [id number] Page 10 of 10 -------------------------------------------------------------------------------- Vitals Details Patient Name: Date of Service: [name], [name] 12/29/2022 2:00 PM Medical Record Number: [id number] Patient Account Number: [id number] Date of Birth/Sex: Treating RN: 29-Aug-1941 (82 y.o. [name] Primary Care [name] [name]: [name] Other Clinician: Massie [name] Referring Janashia Parco: Treating Latrel Szymczak/Extender: [name] in Treatment: 18 Vital Signs Time Taken: 14:17 Temperature (F): 97.7 Weight (lbs): 172 Pulse (bpm): 73 Respiratory Rate (breaths/min): 18 Blood Pressure (mmHg): 156/83 Reference Range: 80 - 120 mg / dl Electronic Signature(s) Signed: 12/31/2022 9:50:53 AM By: [name] Entered By: [name] on 12/29/2022 14:21:00

## 2023-01-05 ENCOUNTER — Encounter (HOSPITAL_BASED_OUTPATIENT_CLINIC_OR_DEPARTMENT_OTHER): Payer: Medicare PPO | Admitting: Internal Medicine

## 2023-01-05 DIAGNOSIS — T798XXA Other early complications of trauma, initial encounter: Secondary | ICD-10-CM

## 2023-01-05 DIAGNOSIS — L97818 Non-pressure chronic ulcer of other part of right lower leg with other specified severity: Secondary | ICD-10-CM | POA: Diagnosis not present

## 2023-01-05 DIAGNOSIS — I87311 Chronic venous hypertension (idiopathic) with ulcer of right lower extremity: Secondary | ICD-10-CM | POA: Diagnosis not present

## 2023-01-05 DIAGNOSIS — E11622 Type 2 diabetes mellitus with other skin ulcer: Secondary | ICD-10-CM

## 2023-01-05 NOTE — Progress Notes (Signed)
KARI, MONTERO ([id number]) [id number] Page 1 of 7 Visit Report for 01/05/2023 Chief Complaint Document Details Patient Name: Date of Service: [name], [name] 01/05/2023 2:00 PM Medical Record Number: [id number] Patient Account Number: [id number] Date of Birth/Sex: Treating RN: 1940-12-24 (82 y.o. [name] Primary Care Provider: [name] Other Clinician: Massie Kluver Referring Provider: Treating Provider/Extender: Laural Roes in Treatment: 19 Information Obtained from: Patient Chief Complaint 08/25/2022; right lower extremity wounds secondary to traumatic hematoma Electronic Signature(s) Signed: 01/05/2023 3:12:59 PM By: Kalman Shan DO Entered By: Kalman Shan on 01/05/2023 14:46:24 -------------------------------------------------------------------------------- HPI Details Patient Name: Date of Service: [name] Puna [name]. 01/05/2023 2:00 PM Medical Record Number: [id number] Patient Account Number: [id number] Date of Birth/Sex: Treating RN: 1941-09-23 (82 y.o. [name] Primary Care Provider: [name] Other Clinician: Massie Kluver Referring Provider: Treating Provider/Extender: Laural Roes in Treatment: 19 History of Present Illness HPI Description: Admission 08/25/2022 [name] is an 82 year old female with a past medical history of breast cancer, with insulin dependent, controlled type 2 diabetes, chronic venous insufficiency that presents to the clinic for a 69-monthhistory of nonhealing ulcer to the right lower extremity. She states she hit her leg against a stair and developed a hematoma. The area was evacuated by her primary care physician. She has tried Neosporin and wet-to-dry dressings. She reports mild chronic pain to the area. She denies increased warmth, erythema or purulent drainage. 9/20; patient presents for follow-up. She had an x-ray of the right  leg done at last clinic visit that did not show Evidence of osteomyelitis. She has been doing Dakin's wet-to-dry dressings without issues. She denies signs of infection. 9/27; patient presents for follow-up. Hydrofera Blue with antibiotic ointment was used under Kerlix/Coban at last clinic visit. She tolerated this well. She has no issues or complaints today. She denies signs of infection. 10/4; patient presents for follow-up. We have been using Hydrofera Blue to the wound bed all under Kerlix/Coban. She has no issues or complaints today. She has tolerated this well. 10/11; patient presents for follow-up. We have been using Hydrofera Blue to the wound bed under Kerlix/Coban. She has tolerated this well and has no issues or complaints. [name], [name]([id number] Page 2 of 7 10/18; patient presents for follow-up. We have been using Hydrofera Blue with Santyl to the wound bed under Kerlix/Coban. She has no issues or complaints today. 10/25; this is a patient with a right lower extremity medial leg ulcer in the setting of fairly severe chronic venous insufficiency. She has been using Santyl Hydrofera Blue rope Hydrofera Blue under kerlix Coban. She presents with 3 tunneling areas in the wound. 11/1; this is a patient with a difficult right lower extremity medial leg ulcer in the setting of fairly clear chronic venous hypertension, hemosiderin deposition. This was initially traumatic in the setting of a fairly clear hematoma looking at cell phone pictures today. We have been using Hydrofera Blue under compression. 11/8; patient presents for follow-up. We have been using Hydrofera Blue blue under compression therapy. She has no issues or complaints today. Snap VAC was recommended at last clinic visit and this was run through insurance. We will ask for clarification on cost. 11/15; patient presents for follow-up. We have been using Hydrofera Blue with antibiotic  ointment under compression therapy. Patient has no issues or complaints today. 11/22; patient presents for follow-up. We have been using silver alginate with antibiotic ointment under compression therapy. Patient denies  signs of infection. 11/29; patient presents for follow-up. We have been using silver alginate with antibiotic ointment under compression therapy. One of the tunnels has healed up. She has no issues or complaints today. 12/6; patient presents for follow-up. We have been using silver alginate with antibiotic ointment under compression therapy. She has no issues or complaints today. 12/13; patient presents for follow-up. We have been using silver alginate with antibiotic ointment under compression therapy. She denies signs of infection. She hit her left leg against a box. She has some mild bruising. No open wound. 12/20; patient presents for follow-up. We have been using endoform with antibiotic ointment under compression therapy. Patient has no issues or complaints today. The ecchymosis has resolved to the left lower extremity. 12/27. We have been using endoform with coating antibiotics superficial cover of Hydrofera Blue under 4-layer compression she has 2.5 cm of tunneling depth is measured by myself today. She is completing clindamycin complains that made her ill but no complaints of diarrhea. We looked over pictures on her for family's phone at the time this was done this was fairly clearly a hematoma 1/3; patient presents for follow-up. We have been using endoform with topical antibiotics and superficial culture of Hydrofera Blue under 4-layer compression. She has no issues or complaints today. 1/10; patient presents for follow-up. We have been using endoform with topical antibiotic and superficial cover of Hydrofera Blue under 4-layer compression. She has no issues or complaints today. There is been improvement in wound healing. 1/17; patient presents for follow-up. We continue  to use endoform with topical antibiotic and superficial covered Hydrofera Blue under 4-layer compression. She has no issues or complaints today. 1/24; patient presents for follow-up. We used endoform with antibiotic ointment under 4-layer compression. Her wound is healed. We gave her information to order compression stockings. Electronic Signature(s) Signed: 01/05/2023 3:12:59 PM By: Kalman Shan DO Entered By: Kalman Shan on 01/05/2023 14:46:53 -------------------------------------------------------------------------------- Physical Exam Details Patient Name: Date of Service: [name] 01/05/2023 2:00 PM Medical Record Number: [id number] Patient Account Number: [id number] Date of Birth/Sex: Treating RN: 07-Jun-1941 (82 y.o. [name] Primary Care Provider: [name] Other Clinician: Massie Kluver Referring Provider: Treating Provider/Extender: Laural Roes in Treatment: 19 Constitutional . Cardiovascular . Psychiatric . [name], [name] ([id number]) [id number] Page 3 of 7 Notes Right lower extremity: T the medial aspect there is Epithelization to the previous wound site. No signs of infection. Good edema control. Venous stasis o dermatitis. Electronic Signature(s) Signed: 01/05/2023 3:12:59 PM By: Kalman Shan DO Entered By: Kalman Shan on 01/05/2023 14:48:14 -------------------------------------------------------------------------------- Physician Orders Details Patient Name: Date of Service: [name] Puna [name]. 01/05/2023 2:00 PM Medical Record Number: [id number] Patient Account Number: [id number] Date of Birth/Sex: Treating RN: 11/22/1941 (82 y.o. [name] Primary Care Provider: [name] Other Clinician: Massie Kluver Referring Provider: Treating Provider/Extender: Laural Roes in Treatment: 53 Verbal / Phone Orders: No Diagnosis Coding Follow-up  Appointments Return Appointment in 1 week. Bathing/ Shower/ Hygiene May shower; gently cleanse wound with antibacterial soap, rinse and pat dry prior to dressing wounds No tub bath. Edema Control - Lymphedema / Segmental Compressive Device / Other Tubigrip single layer applied. - Tubi DD left lower leg Elevate legs to the level of the heart and pump ankles as often as possible Electronic Signature(s) Signed: 01/05/2023 3:12:59 PM By: Kalman Shan DO Entered By: Kalman Shan on 01/05/2023 14:49:33 -------------------------------------------------------------------------------- Problem List Details Patient Name: Date of Service: [name],  [name]. 01/05/2023 2:00 PM Medical Record Number: [id number] Patient Account Number: [id number] Date of Birth/Sex: Treating RN: 08-19-1941 (82 y.o. [name] Primary Care Provider: [name] Other Clinician: Massie Kluver Referring Provider: Treating Provider/Extender: Laural Roes in Treatment: [address] [name], [name] ([id number]) [id number] Page 4 of 7 ICD-10 Encounter Code Description Active Date MDM Diagnosis L97.818 Non-pressure chronic ulcer of other part of right lower leg with other specified 08/25/2022 No Yes severity T79.8XXA Other early complications of trauma, initial encounter 08/25/2022 No Yes T79.2XXA Traumatic secondary and recurrent hemorrhage and seroma, initial encounter 08/25/2022 No Yes E11.622 Type 2 diabetes mellitus with other skin ulcer 08/25/2022 No Yes E03.9 Hypothyroidism, unspecified 08/25/2022 No Yes I10 Essential (primary) hypertension 08/25/2022 No Yes I87.311 Chronic venous hypertension (idiopathic) with ulcer of right lower extremity 08/25/2022 No Yes Inactive Problems Resolved Problems Electronic Signature(s) Signed: 01/05/2023 3:12:59 PM By: Kalman Shan DO Entered By: Kalman Shan on 01/05/2023  14:46:06 -------------------------------------------------------------------------------- Progress Note Details Patient Name: Date of Service: [name] Puna [name]. 01/05/2023 2:00 PM Medical Record Number: [id number] Patient Account Number: [id number] Date of Birth/Sex: Treating RN: [birth year] (82 y.o. [name] Primary Care Provider: [name] Other Clinician: Massie Kluver Referring Provider: Treating Provider/Extender: Laural Roes in Treatment: 75 Subjective Chief Complaint Information obtained from Patient 08/25/2022; right lower extremity wounds secondary to traumatic hematoma History of Present Illness (HPI) Admission 08/25/2022 Ms. Taziyah Iannuzzi is an 82 year old female with a past medical history of breast cancer, with insulin dependent, controlled type 2 diabetes, chronic venous insufficiency that presents to the clinic for a 47-monthhistory of nonhealing ulcer to the right lower extremity. She states she hit her leg against a stair and developed a hematoma. The area was evacuated by her primary care physician. She has tried Neosporin and wet-to-dry dressings. She reports mild chronic [name], [name] ([id number] Page 5 of 7 pain to the area. She denies increased warmth, erythema or purulent drainage. 9/20; patient presents for follow-up. She had an x-ray of the right leg done at last clinic visit that did not show Evidence of osteomyelitis. She has been doing Dakin's wet-to-dry dressings without issues. She denies signs of infection. 9/27; patient presents for follow-up. Hydrofera Blue with antibiotic ointment was used under Kerlix/Coban at last clinic visit. She tolerated this well. She has no issues or complaints today. She denies signs of infection. 10/4; patient presents for follow-up. We have been using Hydrofera Blue to the wound bed all under Kerlix/Coban. She has no issues or complaints today. She has  tolerated this well. 10/11; patient presents for follow-up. We have been using Hydrofera Blue to the wound bed under Kerlix/Coban. She has tolerated this well and has no issues or complaints. 10/18; patient presents for follow-up. We have been using Hydrofera Blue with Santyl to the wound bed under Kerlix/Coban. She has no issues or complaints today. 10/25; this is a patient with a right lower extremity medial leg ulcer in the setting of fairly severe chronic venous insufficiency. She has been using Santyl Hydrofera Blue rope Hydrofera Blue under kerlix Coban. She presents with 3 tunneling areas in the wound. 11/1; this is a patient with a difficult right lower extremity medial leg ulcer in the setting of fairly clear chronic venous hypertension, hemosiderin deposition. This was initially traumatic in the setting of a fairly clear hematoma looking at cell phone pictures today. We have been using Hydrofera Blue under compression. 11/8; patient presents for follow-up.  We have been using Hydrofera Blue blue under compression therapy. She has no issues or complaints today. Snap VAC was recommended at last clinic visit and this was run through insurance. We will ask for clarification on cost. 11/15; patient presents for follow-up. We have been using Hydrofera Blue with antibiotic ointment under compression therapy. Patient has no issues or complaints today. 11/22; patient presents for follow-up. We have been using silver alginate with antibiotic ointment under compression therapy. Patient denies signs of infection. 11/29; patient presents for follow-up. We have been using silver alginate with antibiotic ointment under compression therapy. One of the tunnels has healed up. She has no issues or complaints today. 12/6; patient presents for follow-up. We have been using silver alginate with antibiotic ointment under compression therapy. She has no issues or complaints today. 12/13; patient presents for  follow-up. We have been using silver alginate with antibiotic ointment under compression therapy. She denies signs of infection. She hit her left leg against a box. She has some mild bruising. No open wound. 12/20; patient presents for follow-up. We have been using endoform with antibiotic ointment under compression therapy. Patient has no issues or complaints today. The ecchymosis has resolved to the left lower extremity. 12/27. We have been using endoform with coating antibiotics superficial cover of Hydrofera Blue under 4-layer compression she has 2.5 cm of tunneling depth is measured by myself today. She is completing clindamycin complains that made her ill but no complaints of diarrhea. We looked over pictures on her for family's phone at the time this was done this was fairly clearly a hematoma 1/3; patient presents for follow-up. We have been using endoform with topical antibiotics and superficial culture of Hydrofera Blue under 4-layer compression. She has no issues or complaints today. 1/10; patient presents for follow-up. We have been using endoform with topical antibiotic and superficial cover of Hydrofera Blue under 4-layer compression. She has no issues or complaints today. There is been improvement in wound healing. 1/17; patient presents for follow-up. We continue to use endoform with topical antibiotic and superficial covered Hydrofera Blue under 4-layer compression. She has no issues or complaints today. 1/24; patient presents for follow-up. We used endoform with antibiotic ointment under 4-layer compression. Her wound is healed. We gave her information to order compression stockings. Objective Constitutional Vitals Time Taken: 2:12 PM, Weight: 172 lbs, Temperature: 97.9 F, Pulse: 77 bpm, Respiratory Rate: 16 breaths/min, Blood Pressure: 154/83 mmHg. General Notes: Right lower extremity: T the medial aspect there is Epithelization to the previous wound site. No signs of  infection. Good edema control. o Venous stasis dermatitis. Integumentary (Hair, Skin) Wound #1 status is Healed - Epithelialized. Original cause of wound was Trauma. The date acquired was: 06/27/2022. The wound has been in treatment 19 weeks. The wound is located on the Right,Medial Lower Leg. The wound measures 0cm length x 0cm width x 0cm depth; 0cm^2 area and 0cm^3 volume. There is Fat Layer (Subcutaneous Tissue) exposed. There is a medium amount of serosanguineous drainage noted. The wound margin is thickened. There is small (1- 33%) red granulation within the wound bed. There is a large (67-100%) amount of necrotic tissue within the wound bed including Eschar. Assessment [name], [name] ([id number]) [id number] Page 6 of 7 Active Problems ICD-10 Non-pressure chronic ulcer of other part of right lower leg with other specified severity Other early complications of trauma, initial encounter Traumatic secondary and recurrent hemorrhage and seroma, initial encounter Type 2 diabetes mellitus with other skin ulcer Hypothyroidism, unspecified  Essential (primary) hypertension Chronic venous hypertension (idiopathic) with ulcer of right lower extremity Patient's wound has done well with endoform under 4-layer compression. Her wound is healed. I recommended compression stockings daily. We gave her Tubigrip in office. Continue to elevate legs when sitting. Recommended follow-up in 1 week to assure that the wound has remained closed. Plan Follow-up Appointments: Return Appointment in 1 week. Bathing/ Shower/ Hygiene: May shower; gently cleanse wound with antibacterial soap, rinse and pat dry prior to dressing wounds No tub bath. Edema Control - Lymphedema / Segmental Compressive Device / Other: Tubigrip single layer applied. - Tubi DD left lower leg Elevate legs to the level of the heart and pump ankles as often as possible 1. Compression stockings daily 2.  Follow-up in 1 week Electronic Signature(s) Signed: 01/05/2023 3:12:59 PM By: Kalman Shan DO Entered By: Kalman Shan on 01/05/2023 14:49:10 -------------------------------------------------------------------------------- SuperBill Details Patient Name: Date of Service: [name] 01/05/2023 Medical Record Number: [id number] Patient Account Number: [id number] Date of Birth/Sex: Treating RN: 1941/06/26 (82 y.o. [name] Primary Care Provider: [name] Other Clinician: Massie Kluver Referring Provider: Treating Provider/Extender: Laural Roes in Treatment: 19 Diagnosis Coding ICD-10 Codes Code Description [phone] Non-pressure chronic ulcer of other part of right lower leg with other specified severity T79.8XXA Other early complications of trauma, initial encounter T54.2XXA Traumatic secondary and recurrent hemorrhage and seroma, initial encounter E11.622 Type 2 diabetes mellitus with other skin ulcer E03.9 Hypothyroidism, unspecified I10 Essential (primary) hypertension I87.311 Chronic venous hypertension (idiopathic) with ulcer of right lower extremity Facility Procedures [name], [name] ([id number]): CPT4 Code Description [id number] - WOUND CARE VISIT-LEV 2 EST PT [id number] Page 7 of 7: Modifier Quantity 1 Physician Procedures : CPT4 Code Description Modifier [id number] - WC PHYS LEVEL 3 - EST PT ICD-10 Diagnosis Description L97.818 Non-pressure chronic ulcer of other part of right lower leg with other specified severity T79.8XXA Other early complications of trauma, initial  encounter E11.622 Type 2 diabetes mellitus with other skin ulcer I87.311 Chronic venous hypertension (idiopathic) with ulcer of right lower extremity Quantity: 1 Electronic Signature(s) Signed: 01/05/2023 3:12:59 PM By: Kalman Shan DO Entered By: Kalman Shan on 01/05/2023 14:49:25

## 2023-01-06 NOTE — Progress Notes (Signed)
[name] [name] ([id number]) [id number] Page 1 of 10 Visit Report for 01/05/2023 Arrival Information Details Patient [name]: Date of Service: [name], [name] 01/05/2023 2:00 PM Medical Record Number: [id number] Patient Account Number: [id number] Date of Birth/Sex: Treating RN: [birth year] (82 y.o. [name] [name] Primary Care Redmond Whittley: [name] Other Clinician: Massie Kluver Referring Shruti Arrey: Treating Posey Jasmin/Extender: Laural Roes in Treatment: 67 Visit Information History Since Last Visit All ordered tests and consults were completed: No Patient Arrived: Ambulatory Added or deleted any medications: No Arrival Time: 14:11 Any new allergies or adverse reactions: No Transfer Assistance: None Had a fall or experienced change in No Patient Identification Verified: Yes activities of daily living that may affect Secondary Verification Process Completed: Yes risk of falls: Patient Requires Transmission-Based Precautions: No Signs or symptoms of abuse/neglect since last visito No Patient Has Alerts: Yes Hospitalized since last visit: No Patient Alerts: Patient on Blood Thinner Implantable device outside of the clinic excluding No '81mg'$  aspirin cellular tissue based products placed in the center Type II Diabetic since last visit: Has Dressing in Place as Prescribed: Yes Has Compression in Place as Prescribed: Yes Pain Present Now: No Electronic Signature(s) Signed: 01/05/2023 5:30:54 PM By: Massie Kluver Entered By: Massie Kluver on 01/05/2023 14:12:11 -------------------------------------------------------------------------------- Clinic Level of Care Assessment Details Patient [name]: Date of Service: [name], [name] 01/05/2023 2:00 PM Medical Record Number: [id number] Patient Account Number: [id number] Date of Birth/Sex: Treating RN: 09/11/1941 (82 y.o. [name] [name] Primary Care Iren Whipp: [name] Other Clinician:  Massie Kluver Referring Lalita Ebel: Treating Brynnleigh Mcelwee/Extender: Laural Roes in Treatment: 19 Clinic Level of Care Assessment Items TOOL 4 Quantity Score '[]'$  - 0 Use when only an EandM is performed on FOLLOW-UP visit ASSESSMENTS - Nursing Assessment / Reassessment X- 1 10 Reassessment of Co-morbidities (includes updates in patient status) [name] [name] [name] [name] ([id number]) [phone].pdf Page 2 of 10 X- 1 5 Reassessment of Adherence to Treatment Plan ASSESSMENTS - Wound and Skin A ssessment / Reassessment X - Simple Wound Assessment / Reassessment - one wound 1 5 '[]'$  - 0 Complex Wound Assessment / Reassessment - multiple wounds '[]'$  - 0 Dermatologic / Skin Assessment (not related to wound area) ASSESSMENTS - Focused Assessment '[]'$  - 0 Circumferential Edema Measurements - multi extremities '[]'$  - 0 Nutritional Assessment / Counseling / Intervention '[]'$  - 0 Lower Extremity Assessment (monofilament, tuning fork, pulses) '[]'$  - 0 Peripheral Arterial Disease Assessment (using hand held doppler) ASSESSMENTS - Ostomy and/or Continence Assessment and Care '[]'$  - 0 Incontinence Assessment and Management '[]'$  - 0 Ostomy Care Assessment and Management (repouching, etc.) PROCESS - Coordination of Care X - Simple Patient / Family Education for ongoing care 1 15 '[]'$  - 0 Complex (extensive) Patient / Family Education for ongoing care '[]'$  - 0 Staff obtains Programmer, systems, Records, T Results / Process Orders est '[]'$  - 0 Staff telephones HHA, Nursing Homes / Clarify orders / etc '[]'$  - 0 Routine Transfer to another Facility (non-emergent condition) '[]'$  - 0 Routine Hospital Admission (non-emergent condition) '[]'$  - 0 New Admissions / Biomedical engineer / Ordering NPWT Apligraf, etc. , '[]'$  - 0 Emergency Hospital Admission (emergent condition) X- 1 10 Simple Discharge Coordination '[]'$  - 0 Complex (extensive) Discharge Coordination PROCESS - Special Needs '[]'$  -  0 Pediatric / Minor Patient Management '[]'$  - 0 Isolation Patient Management '[]'$  - 0 Hearing / Language / Visual special needs '[]'$  - 0 Assessment of Community assistance (transportation, D/C planning, etc.) '[]'$  - 0 Additional  assistance / Altered mentation '[]'$  - 0 Support Surface(s) Assessment (bed, cushion, seat, etc.) INTERVENTIONS - Wound Cleansing / Measurement X - Simple Wound Cleansing - one wound 1 5 '[]'$  - 0 Complex Wound Cleansing - multiple wounds X- 1 5 Wound Imaging (photographs - any number of wounds) '[]'$  - 0 Wound Tracing (instead of photographs) '[]'$  - 0 Simple Wound Measurement - one wound '[]'$  - 0 Complex Wound Measurement - multiple wounds INTERVENTIONS - Wound Dressings X - Small Wound Dressing one or multiple wounds 1 10 '[]'$  - 0 Medium Wound Dressing one or multiple wounds '[]'$  - 0 Large Wound Dressing one or multiple wounds '[]'$  - 0 Application of Medications - topical '[]'$  - 0 Application of Medications - injection INTERVENTIONS - Miscellaneous [name], [name] [name] ([id number]) [id number] Page 3 of 10 '[]'$  - 0 External ear exam '[]'$  - 0 Specimen Collection (cultures, biopsies, blood, body fluids, etc.) '[]'$  - 0 Specimen(s) / Culture(s) sent or taken to Lab for analysis '[]'$  - 0 Patient Transfer (multiple staff / Harrel Lemon Lift / Similar devices) '[]'$  - 0 Simple Staple / Suture removal (25 or less) '[]'$  - 0 Complex Staple / Suture removal (26 or more) '[]'$  - 0 Hypo / Hyperglycemic Management (close monitor of Blood Glucose) '[]'$  - 0 Ankle / Brachial Index (ABI) - do not check if billed separately X- 1 5 Vital Signs Has the patient been seen at the hospital within the last three years: Yes Total Score: 70 Level Of Care: New/Established - Level 2 Electronic Signature(s) Signed: 01/05/2023 5:30:54 PM By: Massie Kluver Entered By: Massie Kluver on 01/05/2023  14:39:35 -------------------------------------------------------------------------------- Encounter Discharge Information Details Patient [name]: Date of Service: [name] [name]. 01/05/2023 2:00 PM Medical Record Number: [id number] Patient Account Number: [id number] Date of Birth/Sex: Treating RN: [birth year] (82 y.o. [name] [name] Primary Care Bobbi Yount: [name] Other Clinician: Massie Kluver Referring Euva Rundell: Treating Benjamine Strout/Extender: Laural Roes in Treatment: 19 Encounter Discharge Information Items Discharge Condition: Stable Ambulatory Status: Ambulatory Discharge Destination: Home Transportation: Private Auto Accompanied By: daughter Schedule Follow-up Appointment: Yes Clinical Summary of Care: Electronic Signature(s) Signed: 01/05/2023 5:30:54 PM By: Massie Kluver Entered By: Massie Kluver on 01/05/2023 14:49:07 Marlena Clipper [name] ([id number]) [id number] Page 4 of 10 -------------------------------------------------------------------------------- Lower Extremity Assessment Details Patient [name]: Date of Service: KAYTON, RIPP 01/05/2023 2:00 PM Medical Record Number: [id number] Patient Account Number: [id number] Date of Birth/Sex: Treating RN: 15-Jan-1941 (82 y.o. [name] [name] Primary Care Jeniah Kishi: [name] Other Clinician: Massie Kluver Referring Eliezer Khawaja: Treating Kera Deacon/Extender: Laural Roes in Treatment: 19 Edema Assessment Assessed: [name]: Yes] Patrice Paradise: Yes] Edema: [Left: Yes] [Right: Yes] Calf Left: Right: Point of Measurement: 30 cm From Medial Instep 40 cm 32 cm Ankle Left: Right: Point of Measurement: 10 cm From Medial Instep 25 cm 19.7 cm Vascular Assessment Pulses: Dorsalis Pedis Palpable: [Left:Yes] [Right:Yes] Electronic Signature(s) Signed: 01/05/2023 5:30:54 PM By: Massie Kluver Signed: 01/05/2023 5:47:34 PM By: Gretta Cool, BSN, RN, CWS, Kim RN,  BSN Entered By: Massie Kluver on 01/05/2023 14:44:57 -------------------------------------------------------------------------------- Multi Wound Chart Details Patient [name]: Date of Service: [name] [name]. 01/05/2023 2:00 PM Medical Record Number: [id number] Patient Account Number: [id number] Date of Birth/Sex: Treating RN: [birth year] (82 y.o. 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 Primary Etiology: Extremity Hypertension, Type II Diabetes, N/A N/A Comorbid History: Osteoarthritis, Neuropathy, Received Chemotherapy, Received Radiation 06/27/2022 N/A N/A Date Acquired: 96 N/A N/A Weeks of Treatment: Open N/A N/A Wound Status: No N/A N/A Wound Recurrence: Yes N/A N/A Clustered Wound: 0.1x0.1x0.1 N/A N/A Measurements L x W x D (cm) 0.008 N/A N/A A (cm) : rea 0.001 N/A N/A Volume (cm) : 99.90% N/A N/A % Reduction in A rea: 100.00% N/A N/A % Reduction in Volume: Grade 2 N/A N/A Classification: Medium N/A N/A Exudate A mount: Serosanguineous N/A N/A Exudate Type: red, brown N/A N/A Exudate Color: Thickened N/A N/A Wound Margin: Small (1-33%) N/A N/A Granulation A mount: Red N/A N/A Granulation Quality: Large (67-100%) N/A N/A Necrotic A mount: Eschar, Adherent Slough N/A N/A Necrotic Tissue: Fat Layer (Subcutaneous Tissue): Yes N/A N/A Exposed Structures: Fascia: No Tendon: No Muscle: No Joint: No Bone: No None N/A N/A Epithelialization: Treatment Notes Electronic Signature(s) Signed:  01/05/2023 5:30:54 PM By: Massie Kluver Entered By: Massie Kluver on 01/05/2023 14:26:16 -------------------------------------------------------------------------------- Multi-Disciplinary Care Plan Details Patient [name]: Date of Service: [name] [name]. 01/05/2023 2:00 PM Medical Record Number: [id number] Patient Account Number: [id number] Date of Birth/Sex: Treating RN: 1941-08-03 (82 y.o. [name] [name] Primary Care Jess Toney: [name] Other Clinician: Massie Kluver Referring Khalise Billard: Treating Masai Kidd/Extender: Laural Roes in Treatment: 19 Active Inactive Necrotic Tissue Nursing Diagnoses: Impaired tissue integrity related to necrotic/devitalized tissue Knowledge deficit related to management of necrotic/devitalized tissue Goals: Necrotic/devitalized tissue will be minimized in the wound bed Date Initiated: 08/25/2022 Target Resolution Date: 08/25/2022 Goal Status: Active CHARYL, MINERVINI ([id number]) [phone].pdf Page 6 of 10 Patient/caregiver will verbalize understanding of reason and process for debridement of necrotic tissue Date Initiated: 08/25/2022 Target Resolution Date: 08/25/2022 Goal Status: Active Interventions: Assess patient pain level pre-, during and post procedure and prior to discharge Provide education on necrotic tissue and debridement process Treatment Activities: Excisional debridement : 08/25/2022 Notes: Pain, Acute or Chronic Nursing Diagnoses: Pain Management - Cyclic Acute (Dressing Change Related) Pain, acute or chronic: actual or potential Potential alteration in comfort, pain Goals: Patient will verbalize adequate pain control and receive pain control interventions during procedures as needed Date Initiated: 08/25/2022 Target Resolution Date: 08/25/2022 Goal Status: Active Patient/caregiver will verbalize adequate pain control between visits Date Initiated: 08/25/2022 Target Resolution  Date: 08/25/2022 Goal Status: Active Patient/caregiver will verbalize comfort level met Date Initiated: 08/25/2022 Target Resolution Date: 08/25/2022 Goal Status: Active Interventions: Provide education on pain management Reposition patient for comfort Treatment Activities: Administer pain control measures as ordered : 08/25/2022 Notes: Peripheral Neuropathy Nursing Diagnoses: Knowledge deficit related to disease process and management of peripheral neurovascular dysfunction Potential alteration in peripheral tissue perfusion (select prior to confirmation of diagnosis) Goals: Patient/caregiver will verbalize understanding of disease process and disease management Date Initiated: 08/25/2022 Target Resolution Date: 08/25/2022 Goal Status: Active Interventions: Assess signs and symptoms of neuropathy upon admission and as needed Provide education on Management of Neuropathy and Related Ulcers Notes: Wound/Skin Impairment Nursing Diagnoses: Impaired tissue integrity Knowledge deficit related to smoking impact on wound healing Knowledge deficit related to ulceration/compromised skin integrity Goals: Patient/caregiver will verbalize understanding of skin care regimen Date Initiated: 08/25/2022 Target Resolution Date: 08/25/2022 Goal Status: Active Ulcer/skin breakdown will have a volume reduction of 30% by week 4 Date Initiated: 08/25/2022 Date Inactivated: 11/17/2022 Target Resolution Date: 09/22/2022 Goal Status: Met  Ulcer/skin breakdown will have a volume reduction of 50% by week 8 Date Initiated: 11/17/2022 Date Inactivated: 11/17/2022 Target Resolution Date: 10/20/2022 Goal Status: Met Ulcer/skin breakdown will have a volume reduction of 80% by week 12 Date Initiated: 11/17/2022 Date Inactivated: 11/17/2022 Target Resolution Date: 11/17/2022 Goal Status: Met [name], [name] ([id number]) [phone].pdf Page 7 of 10 Interventions: Assess ulceration(s) every  visit Provide education on smoking Notes: Electronic Signature(s) Signed: 01/05/2023 5:30:54 PM By: Massie Kluver Signed: 01/05/2023 5:47:34 PM By: Gretta Cool, BSN, RN, CWS, Kim RN, BSN Entered By: Massie Kluver on 01/05/2023 14:48:24 -------------------------------------------------------------------------------- Pain Assessment Details Patient [name]: Date of Service: [name] [name]. 01/05/2023 2:00 PM Medical Record Number: [id number] Patient Account Number: [id number] Date of Birth/Sex: Treating RN: [birth year] (82 y.o. [name] [name] Primary Care Ketra Duchesne: [name] Other Clinician: Massie Kluver Referring Latrisa Hellums: Treating Earsie Humm/Extender: Laural Roes in Treatment: 19 Active Problems Location of Pain Severity and Description of Pain Patient Has Paino No Site Locations Pain Management and Medication Current Pain Management: Electronic Signature(s) Signed: 01/05/2023 5:30:54 PM By: Massie Kluver Signed: 01/05/2023 5:47:34 PM By: Gretta Cool, BSN, RN, CWS, Kim RN, BSN Entered By: Massie Kluver on 01/05/2023 14:16:00 Marlena Clipper [name] ([id number]) [id number] Page 8 of 10 -------------------------------------------------------------------------------- Patient/Caregiver Education Details Patient [name]: Date of Service: [name], [name] 1/24/2024andnbsp2:00 PM Medical Record Number: [id number] Patient Account Number: [id number] Date of Birth/Gender: Treating RN: 10-29-1941 (82 y.o. [name] [name] Primary Care Physician: [name] Other Clinician: Massie Kluver Referring Physician: Treating Physician/Extender: Laural Roes in Treatment: 72 Education Assessment Education Provided To: Patient Education Topics Provided Venous: Handouts: Controlling Swelling with Compression Stockings, Other: wear compression stockings daily Methods: Explain/Verbal Responses: State content correctly Electronic  Signature(s) Signed: 01/05/2023 5:30:54 PM By: Massie Kluver Entered By: Massie Kluver on 01/05/2023 14:48:19 -------------------------------------------------------------------------------- Wound Assessment Details Patient [name]: Date of Service: [name] [name]. 01/05/2023 2:00 PM Medical Record Number: [id number] Patient Account Number: [id number] Date of Birth/Sex: Treating RN: 1941/03/16 (82 y.o. [name] [name] Primary Care Mann Skaggs: [name] Other Clinician: Massie Kluver Referring Zylah Elsbernd: Treating Eutimio Gharibian/Extender: Laural Roes in Treatment: 19 Wound Status Wound Number: 1 Primary Diabetic Wound/Ulcer of the Lower Extremity Etiology: Wound Location: Right, Medial Lower Leg Wound Healed - Epithelialized Wounding Event: Trauma Status: Date Acquired: 06/27/2022 Comorbid Hypertension, Type II Diabetes, Osteoarthritis, Neuropathy, Weeks Of Treatment: 19 History: Received Chemotherapy, Received Radiation Clustered Wound: Yes Photos [name], ROEDEL [name] ([id number]) [phone].pdf Page 9 of 10 Wound Measurements Length: (cm) Width: (cm) Depth: (cm) Area: (cm) Volume: (cm) 0 % Reduction in Area: 100% 0 % Reduction in Volume: 100% 0 Epithelialization: None 0 0 Wound Description Classification: Grade 2 Wound Margin: Thickened Exudate Amount: Medium Exudate Type: Serosanguineous Exudate Color: red, brown Foul Odor After Cleansing: No Slough/Fibrino Yes Wound Bed Granulation Amount: Small (1-33%) Exposed Structure Granulation Quality: Red Fascia Exposed: No Necrotic Amount: Large (67-100%) Fat Layer (Subcutaneous Tissue) Exposed: Yes Necrotic Quality: Eschar Tendon Exposed: No Muscle Exposed: No Joint Exposed: No Bone Exposed: No Treatment Notes Wound #1 (Lower Leg) Wound Laterality: Right, Medial Cleanser Peri-Wound Care Topical Primary Dressing Secondary Dressing Secured With Compression  Wrap Compression Stockings Add-Ons Electronic Signature(s) Signed: 01/05/2023 5:30:54 PM By: Massie Kluver Signed: 01/05/2023 5:47:34 PM By: Gretta Cool, BSN, RN, CWS, Kim RN, BSN Entered By: Massie Kluver on 01/05/2023 14:38:26 Marlena Clipper [name] ([id number]) [id number] Page 10 of 10 -------------------------------------------------------------------------------- Vitals Details Patient [name]: Date of Service: [name], KLUTTS. 01/05/2023 2:00 PM Medical  Record Number: [id number] Patient Account Number: [id number] Date of Birth/Sex: Treating RN: [birth year] (82 y.o. [name] [name] Primary Care Windsor Goeken: [name] Other Clinician: Massie Kluver Referring Darrnell Mangiaracina: Treating Mykala Mccready/Extender: Laural Roes in Treatment: 19 Vital Signs Time Taken: 14:12 Temperature (F): 97.9 Weight (lbs): 172 Pulse (bpm): 77 Respiratory Rate (breaths/min): 16 Blood Pressure (mmHg): 154/83 Reference Range: 80 - 120 mg / dl Electronic Signature(s) Signed: 01/05/2023 5:30:54 PM By: Massie Kluver Entered By: Massie Kluver on 01/05/2023 14:15:55

## 2023-01-11 DIAGNOSIS — M19019 Primary osteoarthritis, unspecified shoulder: Secondary | ICD-10-CM | POA: Insufficient documentation

## 2023-01-11 DIAGNOSIS — M25512 Pain in left shoulder: Secondary | ICD-10-CM | POA: Insufficient documentation

## 2023-01-12 ENCOUNTER — Encounter (HOSPITAL_BASED_OUTPATIENT_CLINIC_OR_DEPARTMENT_OTHER): Payer: Medicare PPO | Admitting: Internal Medicine

## 2023-01-12 DIAGNOSIS — E11622 Type 2 diabetes mellitus with other skin ulcer: Secondary | ICD-10-CM

## 2023-01-12 DIAGNOSIS — T798XXA Other early complications of trauma, initial encounter: Secondary | ICD-10-CM

## 2023-01-12 DIAGNOSIS — L97818 Non-pressure chronic ulcer of other part of right lower leg with other specified severity: Secondary | ICD-10-CM

## 2023-01-12 DIAGNOSIS — I87311 Chronic venous hypertension (idiopathic) with ulcer of right lower extremity: Secondary | ICD-10-CM

## 2023-01-12 NOTE — Progress Notes (Signed)
[name], [name] ([id number]) [id number] Page 1 of 7 Visit Report for 01/12/2023 Chief Complaint Document Details Patient Name: Date of Service: [name], [name] 01/12/2023 2:00 PM Medical Record Number: [id number] Patient Account Number: [id number] Date of Birth/Sex: Treating RN: 1941-05-24 (82 y.o. [name] Primary Care Provider: [name] Other Clinician: Massie Kluver Referring Provider: Treating Provider/Extender: Laural Roes in Treatment: 20 Information Obtained from: Patient Chief Complaint 08/25/2022; right lower extremity wounds secondary to traumatic hematoma Electronic Signature(s) Signed: 01/12/2023 2:37:56 PM By: Kalman Shan DO Entered By: Kalman Shan on 01/12/2023 14:34:44 -------------------------------------------------------------------------------- HPI Details Patient Name: Date of Service: [name] Puna B. 01/12/2023 2:00 PM Medical Record Number: [id number] Patient Account Number: [id number] Date of Birth/Sex: Treating RN: October 09, 1941 (82 y.o. [name] Primary Care Provider: [name] Other Clinician: Massie Kluver Referring Provider: Treating Provider/Extender: Laural Roes in Treatment: 20 History of Present Illness HPI Description: Admission 08/25/2022 [name] is an 82 year old female with a past medical history of breast cancer, with insulin dependent, controlled type 2 diabetes, chronic venous insufficiency that presents to the clinic for a 42-monthhistory of nonhealing ulcer to the right lower extremity. She states she hit her leg against a stair and developed a hematoma. The area was evacuated by her primary care physician. She has tried Neosporin and wet-to-dry dressings. She reports mild chronic pain to the area. She denies increased warmth, erythema or purulent drainage. 9/20; patient presents for follow-up. She had an x-ray of the right  leg done at last clinic visit that did not show Evidence of osteomyelitis. She has been doing Dakin's wet-to-dry dressings without issues. She denies signs of infection. 9/27; patient presents for follow-up. Hydrofera Blue with antibiotic ointment was used under Kerlix/Coban at last clinic visit. She tolerated this well. She has no issues or complaints today. She denies signs of infection. 10/4; patient presents for follow-up. We have been using Hydrofera Blue to the wound bed all under Kerlix/Coban. She has no issues or complaints today. She has tolerated this well. 10/11; patient presents for follow-up. We have been using Hydrofera Blue to the wound bed under Kerlix/Coban. She has tolerated this well and has no issues or complaints. [name], [name]([id number] Page 2 of 7 10/18; patient presents for follow-up. We have been using Hydrofera Blue with Santyl to the wound bed under Kerlix/Coban. She has no issues or complaints today. 10/25; this is a patient with a right lower extremity medial leg ulcer in the setting of fairly severe chronic venous insufficiency. She has been using Santyl Hydrofera Blue rope Hydrofera Blue under kerlix Coban. She presents with 3 tunneling areas in the wound. 11/1; this is a patient with a difficult right lower extremity medial leg ulcer in the setting of fairly clear chronic venous hypertension, hemosiderin deposition. This was initially traumatic in the setting of a fairly clear hematoma looking at cell phone pictures today. We have been using Hydrofera Blue under compression. 11/8; patient presents for follow-up. We have been using Hydrofera Blue blue under compression therapy. She has no issues or complaints today. Snap VAC was recommended at last clinic visit and this was run through insurance. We will ask for clarification on cost. 11/15; patient presents for follow-up. We have been using Hydrofera Blue with antibiotic  ointment under compression therapy. Patient has no issues or complaints today. 11/22; patient presents for follow-up. We have been using silver alginate with antibiotic ointment under compression therapy. Patient denies  signs of infection. 11/29; patient presents for follow-up. We have been using silver alginate with antibiotic ointment under compression therapy. One of the tunnels has healed up. She has no issues or complaints today. 12/6; patient presents for follow-up. We have been using silver alginate with antibiotic ointment under compression therapy. She has no issues or complaints today. 12/13; patient presents for follow-up. We have been using silver alginate with antibiotic ointment under compression therapy. She denies signs of infection. She hit her left leg against a box. She has some mild bruising. No open wound. 12/20; patient presents for follow-up. We have been using endoform with antibiotic ointment under compression therapy. Patient has no issues or complaints today. The ecchymosis has resolved to the left lower extremity. 12/27. We have been using endoform with coating antibiotics superficial cover of Hydrofera Blue under 4-layer compression she has 2.5 cm of tunneling depth is measured by myself today. She is completing clindamycin complains that made her ill but no complaints of diarrhea. We looked over pictures on her for family's phone at the time this was done this was fairly clearly a hematoma 1/3; patient presents for follow-up. We have been using endoform with topical antibiotics and superficial culture of Hydrofera Blue under 4-layer compression. She has no issues or complaints today. 1/10; patient presents for follow-up. We have been using endoform with topical antibiotic and superficial cover of Hydrofera Blue under 4-layer compression. She has no issues or complaints today. There is been improvement in wound healing. 1/17; patient presents for follow-up. We continue  to use endoform with topical antibiotic and superficial covered Hydrofera Blue under 4-layer compression. She has no issues or complaints today. 1/24; patient presents for follow-up. We used endoform with antibiotic ointment under 4-layer compression. Her wound is healed. We gave her information to order compression stockings. 1/31; patient presents for follow-up. She has been using her compression stockings daily for the past week. She has not had no issues from where the previous wound site was located on her right lower extremity. She reports no drainage or open wound. Electronic Signature(s) Signed: 01/12/2023 2:37:56 PM By: Kalman Shan DO Entered By: Kalman Shan on 01/12/2023 14:36:01 -------------------------------------------------------------------------------- Physical Exam Details Patient Name: Date of Service: [name] 01/12/2023 2:00 PM Medical Record Number: [id number] Patient Account Number: [id number] Date of Birth/Sex: Treating RN: 1941/01/01 (82 y.o. [name] Primary Care Provider: [name] Other Clinician: Massie Kluver Referring Provider: Treating Provider/Extender: Laural Roes in Treatment: 20 Constitutional . Cardiovascular . MARABELLA, POPIEL ([id number]) [id number] Page 3 of 7 Psychiatric . Notes Right lower extremity: T the medial aspect there is Epithelization to the previous wound site. No signs of infection. Good edema control. Venous stasis o dermatitis. Electronic Signature(s) Signed: 01/12/2023 2:37:56 PM By: Kalman Shan DO Entered By: Kalman Shan on 01/12/2023 14:36:20 -------------------------------------------------------------------------------- Physician Orders Details Patient Name: Date of Service: [name] Puna B. 01/12/2023 2:00 PM Medical Record Number: [id number] Patient Account Number: [id number] Date of Birth/Sex: Treating RN: 1941/10/14 (82 y.o.  [name] Primary Care Provider: [name] Other Clinician: Massie Kluver Referring Provider: Treating Provider/Extender: Laural Roes in Treatment: 20 Verbal / Phone Orders: No Diagnosis Coding Discharge From [facility] Discharge from [place]! Your wound has healed Wear compression garments daily. Put garments on first thing when you wake up and remove them before bed. Moisturize legs daily after removing compression garments. Elevate, Exercise Daily and A void Standing  for Long Periods of Time. Electronic Signature(s) Signed: 01/12/2023 2:37:56 PM By: Kalman Shan DO Entered By: Kalman Shan on 01/12/2023 14:37:30 -------------------------------------------------------------------------------- Problem List Details Patient Name: Date of Service: [name] Puna B. 01/12/2023 2:00 PM Medical Record Number: [id number] Patient Account Number: [id number] Date of Birth/Sex: Treating RN: [birth year] (82 y.o. [name] Primary Care Provider: [name] Other Clinician: Massie Kluver Referring Provider: Treating Provider/Extender: Laural Roes in Treatment: 20 Active Problems ICD-10 Encounter Code Description Active Date MDM MAYMUNAH, STEGEMANN ([id number]) [id number] Page 4 of 7 Code Description Active Date MDM Diagnosis L97.818 Non-pressure chronic ulcer of other part of right lower leg with other specified 08/25/2022 No Yes severity T79.8XXA Other early complications of trauma, initial encounter 08/25/2022 No Yes T79.2XXA Traumatic secondary and recurrent hemorrhage and seroma, initial encounter 08/25/2022 No Yes E11.622 Type 2 diabetes mellitus with other skin ulcer 08/25/2022 No Yes E03.9 Hypothyroidism, unspecified 08/25/2022 No Yes I10 Essential (primary) hypertension 08/25/2022 No Yes I87.311 Chronic venous hypertension (idiopathic) with  ulcer of right lower extremity 08/25/2022 No Yes Inactive Problems Resolved Problems Electronic Signature(s) Signed: 01/12/2023 2:37:56 PM By: Kalman Shan DO Entered By: Kalman Shan on 01/12/2023 14:34:40 -------------------------------------------------------------------------------- Progress Note Details Patient Name: Date of Service: [name] Puna B. 01/12/2023 2:00 PM Medical Record Number: [id number] Patient Account Number: [id number] Date of Birth/Sex: Treating RN: 1941/05/10 (82 y.o. [name] Primary Care Provider: [name] Other Clinician: Massie Kluver Referring Provider: Treating Provider/Extender: Laural Roes in Treatment: 20 Subjective Chief Complaint Information obtained from Patient 08/25/2022; right lower extremity wounds secondary to traumatic hematoma History of Present Illness (HPI) Admission 08/25/2022 [name] is an 82 year old female with a past medical history of breast cancer, with insulin dependent, controlled type 2 diabetes, chronic venous insufficiency that presents to the clinic for a 33-monthhistory of nonhealing ulcer to the right lower extremity. She states she hit her leg against a stair and developed a hematoma. The area was evacuated by her primary care physician. She has tried Neosporin and wet-to-dry dressings. She reports mild chronic pain to the area. She denies increased warmth, erythema or purulent drainage. 9/20; patient presents for follow-up. She had an x-ray of the right leg done at last clinic visit that did not show Evidence of osteomyelitis. She has been doing [name], [name] ([id number] Page 5 of 7 Dakin's wet-to-dry dressings without issues. She denies signs of infection. 9/27; patient presents for follow-up. Hydrofera Blue with antibiotic ointment was used under Kerlix/Coban at last clinic visit. She tolerated this well. She has no issues or  complaints today. She denies signs of infection. 10/4; patient presents for follow-up. We have been using Hydrofera Blue to the wound bed all under Kerlix/Coban. She has no issues or complaints today. She has tolerated this well. 10/11; patient presents for follow-up. We have been using Hydrofera Blue to the wound bed under Kerlix/Coban. She has tolerated this well and has no issues or complaints. 10/18; patient presents for follow-up. We have been using Hydrofera Blue with Santyl to the wound bed under Kerlix/Coban. She has no issues or complaints today. 10/25; this is a patient with a right lower extremity medial leg ulcer in the setting of fairly severe chronic venous insufficiency. She has been using Santyl Hydrofera Blue rope Hydrofera Blue under kerlix Coban. She presents with 3 tunneling areas in the wound. 11/1; this is a patient with a difficult right lower extremity medial leg ulcer in the setting of  fairly clear chronic venous hypertension, hemosiderin deposition. This was initially traumatic in the setting of a fairly clear hematoma looking at cell phone pictures today. We have been using Hydrofera Blue under compression. 11/8; patient presents for follow-up. We have been using Hydrofera Blue blue under compression therapy. She has no issues or complaints today. Snap VAC was recommended at last clinic visit and this was run through insurance. We will ask for clarification on cost. 11/15; patient presents for follow-up. We have been using Hydrofera Blue with antibiotic ointment under compression therapy. Patient has no issues or complaints today. 11/22; patient presents for follow-up. We have been using silver alginate with antibiotic ointment under compression therapy. Patient denies signs of infection. 11/29; patient presents for follow-up. We have been using silver alginate with antibiotic ointment under compression therapy. One of the tunnels has healed up. She has no issues or  complaints today. 12/6; patient presents for follow-up. We have been using silver alginate with antibiotic ointment under compression therapy. She has no issues or complaints today. 12/13; patient presents for follow-up. We have been using silver alginate with antibiotic ointment under compression therapy. She denies signs of infection. She hit her left leg against a box. She has some mild bruising. No open wound. 12/20; patient presents for follow-up. We have been using endoform with antibiotic ointment under compression therapy. Patient has no issues or complaints today. The ecchymosis has resolved to the left lower extremity. 12/27. We have been using endoform with coating antibiotics superficial cover of Hydrofera Blue under 4-layer compression she has 2.5 cm of tunneling depth is measured by myself today. She is completing clindamycin complains that made her ill but no complaints of diarrhea. We looked over pictures on her for family's phone at the time this was done this was fairly clearly a hematoma 1/3; patient presents for follow-up. We have been using endoform with topical antibiotics and superficial culture of Hydrofera Blue under 4-layer compression. She has no issues or complaints today. 1/10; patient presents for follow-up. We have been using endoform with topical antibiotic and superficial cover of Hydrofera Blue under 4-layer compression. She has no issues or complaints today. There is been improvement in wound healing. 1/17; patient presents for follow-up. We continue to use endoform with topical antibiotic and superficial covered Hydrofera Blue under 4-layer compression. She has no issues or complaints today. 1/24; patient presents for follow-up. We used endoform with antibiotic ointment under 4-layer compression. Her wound is healed. We gave her information to order compression stockings. 1/31; patient presents for follow-up. She has been using her compression stockings daily for  the past week. She has not had no issues from where the previous wound site was located on her right lower extremity. She reports no drainage or open wound. Objective Constitutional Vitals Time Taken: 2:10 PM, Weight: 172 lbs, Temperature: 97.7 F, Pulse: 77 bpm, Respiratory Rate: 18 breaths/min, Blood Pressure: 175/88 mmHg. General Notes: Right lower extremity: T the medial aspect there is Epithelization to the previous wound site. No signs of infection. Good edema control. o Venous stasis dermatitis. Assessment Active Problems ICD-10 Non-pressure chronic ulcer of other part of right lower leg with other specified severity Other early complications of trauma, initial encounter Traumatic secondary and recurrent hemorrhage and seroma, initial encounter [name], [name] ([id number]) [id number] Page 6 of 7 Type 2 diabetes mellitus with other skin ulcer Hypothyroidism, unspecified Essential (primary) hypertension Chronic venous hypertension (idiopathic) with ulcer of right lower extremity Over the past week patient has had  no issues. Her wound remains closed. I recommended compression stockings daily. She may follow-up as needed. Plan Discharge From [facility] Services: Discharge from [place]! Your wound has healed Wear compression garments daily. Put garments on first thing when you wake up and remove them before bed. Moisturize legs daily after removing compression garments. Elevate, Exercise Daily and Avoid Standing for Long Periods of Time. 1. Compression stockings daily 2. Discharge from clinic due to closed wound 3. Follow-up as needed Electronic Signature(s) Signed: 01/12/2023 2:37:56 PM By: Kalman Shan DO Entered By: Kalman Shan on 01/12/2023 14:36:51 -------------------------------------------------------------------------------- SuperBill Details Patient Name: Date of Service: [name]  01/12/2023 Medical Record Number: [id number] Patient Account Number: [id number] Date of Birth/Sex: Treating RN: Aug 06, 1941 (82 y.o. [name] Primary Care Provider: [name] Other Clinician: Massie Kluver Referring Provider: Treating Provider/Extender: Laural Roes in Treatment: 20 Diagnosis Coding ICD-10 Codes Code Description [phone] Non-pressure chronic ulcer of other part of right lower leg with other specified severity T79.8XXA Other early complications of trauma, initial encounter T66.2XXA Traumatic secondary and recurrent hemorrhage and seroma, initial encounter E11.622 Type 2 diabetes mellitus with other skin ulcer E03.9 Hypothyroidism, unspecified I10 Essential (primary) hypertension I87.311 Chronic venous hypertension (idiopathic) with ulcer of right lower extremity Facility Procedures : CPT4 Code: [id number] Description: 37290 - WOUND CARE VISIT-LEV 2 EST PT Modifier: Quantity: 1 Physician Procedures : CPT4 Code Description Modifier [id number] - WC PHYS LEVEL 3 - EST PT YVETT, ROSSEL ([id number]) [id number] ICD-10 Diagnosis Description I87.311 Chronic venous hypertension (idiopathic) with ulcer of right lower extremity  E11.622 Type 2 diabetes mellitus with other skin ulcer L97.818 Non-pressure chronic ulcer of other part of right lower leg with other specified severity T79.8XXA Other early complications of trauma, initial encounter Quantity: 1 .pdf Page 7 of 7 Electronic Signature(s) Signed: 01/12/2023 2:37:56 PM By: Kalman Shan DO Entered By: Kalman Shan on 01/12/2023 14:37:22

## 2023-01-13 NOTE — Progress Notes (Signed)
[name], CRIMI B ([id number]) [id number] Page 1 of 7 Visit Report for 01/12/2023 Arrival Information Details Patient Name: Date of Service: [name], [name] 01/12/2023 2:00 PM Medical Record Number: [id number] Patient Account Number: [id number] Date of Birth/Sex: Treating RN: 04-Apr-1941 (82 y.o. [name] Primary Care Latoiya Maradiaga: [name] Other Clinician: Massie Kluver Referring Ileene Allie: Treating Kawan Valladolid/Extender: Laural Roes in Treatment: 20 Visit Information History Since Last Visit All ordered tests and consults were completed: No Patient Arrived: Ambulatory Added or deleted any medications: No Arrival Time: 14:08 Any new allergies or adverse reactions: No Transfer Assistance: None Had a fall or experienced change in No Patient Identification Verified: Yes activities of daily living that may affect Secondary Verification Process Completed: Yes risk of falls: Patient Requires Transmission-Based Precautions: No Signs or symptoms of abuse/neglect since last visito No Patient Has Alerts: Yes Hospitalized since last visit: No Patient Alerts: Patient on Blood Thinner Implantable device outside of the clinic excluding No '81mg'$  aspirin cellular tissue based products placed in the center Type II Diabetic since last visit: Has Dressing in Place as Prescribed: No Pain Present Now: No Electronic Signature(s) Signed: 01/12/2023 4:51:29 PM By: Massie Kluver Entered By: Massie Kluver on 01/12/2023 14:10:10 -------------------------------------------------------------------------------- Clinic Level of Care Assessment Details Patient Name: Date of Service: [name] 01/12/2023 2:00 PM Medical Record Number: [id number] Patient Account Number: [id number] Date of Birth/Sex: Treating RN: [birth year] (82 y.o. [name] Primary Care Pal Shell: [name] Other Clinician: Massie Kluver Referring Zulema Pulaski: Treating  Avantika Shere/Extender: Laural Roes in Treatment: 20 Clinic Level of Care Assessment Items TOOL 4 Quantity Score '[]'$  - 0 Use when only an EandM is performed on FOLLOW-UP visit ASSESSMENTS - Nursing Assessment / Reassessment X- 1 10 Reassessment of Co-morbidities (includes updates in patient status) X- 1 5 Reassessment of Adherence to Treatment Plan BETZY, BARBIER B ([id number]) [phone].pdf Page 2 of 7 ASSESSMENTS - Wound and Skin A ssessment / Reassessment X - Simple Wound Assessment / Reassessment - one wound 1 5 '[]'$  - 0 Complex Wound Assessment / Reassessment - multiple wounds '[]'$  - 0 Dermatologic / Skin Assessment (not related to wound area) ASSESSMENTS - Focused Assessment '[]'$  - 0 Circumferential Edema Measurements - multi extremities '[]'$  - 0 Nutritional Assessment / Counseling / Intervention '[]'$  - 0 Lower Extremity Assessment (monofilament, tuning fork, pulses) '[]'$  - 0 Peripheral Arterial Disease Assessment (using hand held doppler) ASSESSMENTS - Ostomy and/or Continence Assessment and Care '[]'$  - 0 Incontinence Assessment and Management '[]'$  - 0 Ostomy Care Assessment and Management (repouching, etc.) PROCESS - Coordination of Care X - Simple Patient / Family Education for ongoing care 1 15 '[]'$  - 0 Complex (extensive) Patient / Family Education for ongoing care '[]'$  - 0 Staff obtains Programmer, systems, Records, T Results / Process Orders est '[]'$  - 0 Staff telephones HHA, Nursing Homes / Clarify orders / etc '[]'$  - 0 Routine Transfer to another Facility (non-emergent condition) '[]'$  - 0 Routine Hospital Admission (non-emergent condition) '[]'$  - 0 New Admissions / Biomedical engineer / Ordering NPWT Apligraf, etc. , '[]'$  - 0 Emergency Hospital Admission (emergent condition) X- 1 10 Simple Discharge Coordination '[]'$  - 0 Complex (extensive) Discharge Coordination PROCESS - Special Needs '[]'$  - 0 Pediatric / Minor Patient Management '[]'$  -  0 Isolation Patient Management '[]'$  - 0 Hearing / Language / Visual special needs '[]'$  - 0 Assessment of Community assistance (transportation, D/C planning, etc.) '[]'$  - 0 Additional assistance / Altered mentation '[]'$  - 0  Support Surface(s) Assessment (bed, cushion, seat, etc.) INTERVENTIONS - Wound Cleansing / Measurement X - Simple Wound Cleansing - one wound 1 5 '[]'$  - 0 Complex Wound Cleansing - multiple wounds '[]'$  - 0 Wound Imaging (photographs - any number of wounds) '[]'$  - 0 Wound Tracing (instead of photographs) '[]'$  - 0 Simple Wound Measurement - one wound '[]'$  - 0 Complex Wound Measurement - multiple wounds INTERVENTIONS - Wound Dressings '[]'$  - 0 Small Wound Dressing one or multiple wounds '[]'$  - 0 Medium Wound Dressing one or multiple wounds '[]'$  - 0 Large Wound Dressing one or multiple wounds '[]'$  - 0 Application of Medications - topical '[]'$  - 0 Application of Medications - injection INTERVENTIONS - Miscellaneous '[]'$  - 0 External ear exam MYESHA, STILLION B ([id number]) [id number] Page 3 of 7 '[]'$  - 0 Specimen Collection (cultures, biopsies, blood, body fluids, etc.) '[]'$  - 0 Specimen(s) / Culture(s) sent or taken to Lab for analysis '[]'$  - 0 Patient Transfer (multiple staff / Harrel Lemon Lift / Similar devices) '[]'$  - 0 Simple Staple / Suture removal (25 or less) '[]'$  - 0 Complex Staple / Suture removal (26 or more) '[]'$  - 0 Hypo / Hyperglycemic Management (close monitor of Blood Glucose) '[]'$  - 0 Ankle / Brachial Index (ABI) - do not check if billed separately X- 1 5 Vital Signs Has the patient been seen at the hospital within the last three years: Yes Total Score: 55 Level Of Care: New/Established - Level 2 Electronic Signature(s) Signed: 01/12/2023 4:51:29 PM By: Massie Kluver Entered By: Massie Kluver on 01/12/2023 14:27:11 -------------------------------------------------------------------------------- Encounter Discharge Information Details Patient Name:  Date of Service: [name] Puna B. 01/12/2023 2:00 PM Medical Record Number: [id number] Patient Account Number: [id number] Date of Birth/Sex: Treating RN: Sep 20, 1941 (82 y.o. [name] Primary Care Makeya Hilgert: [name] Other Clinician: Massie Kluver Referring Riyah Bardon: Treating Rien Marland/Extender: Laural Roes in Treatment: 20 Encounter Discharge Information Items Discharge Condition: Stable Ambulatory Status: Ambulatory Discharge Destination: Home Transportation: Private Auto Accompanied By: daughter Schedule Follow-up Appointment: Yes Clinical Summary of Care: Electronic Signature(s) Signed: 01/12/2023 4:51:29 PM By: Massie Kluver Entered By: Massie Kluver on 01/12/2023 14:30:43 Lower Extremity Assessment Details -------------------------------------------------------------------------------- [name] ([id number]) [id number] Page 4 of 7 Patient Name: Date of Service: JAYLIA, PETTUS 01/12/2023 2:00 PM Medical Record Number: [id number] Patient Account Number: [id number] Date of Birth/Sex: Treating RN: 02-12-1941 (82 y.o. [name] Primary Care Elgene Coral: [name] Other Clinician: Massie Kluver Referring Jacquez Sheetz: Treating Cherell Colvin/Extender: Laural Roes in Treatment: 20 Edema Assessment Left: Right: Assessed: No Yes Edema: Yes Calf Left: Right: Point of Measurement: 30 cm From Medial Instep 33.5 cm Ankle Left: Right: Point of Measurement: 10 cm From Medial Instep 22.2 cm Vascular Assessment Left: Right: Pulses: Dorsalis Pedis Palpable: Yes Electronic Signature(s) Signed: 01/12/2023 4:51:29 PM By: Massie Kluver Signed: 01/12/2023 7:35:08 PM By: Gretta Cool, BSN, RN, CWS, Kim RN, BSN Entered By: Massie Kluver on 01/12/2023 14:17:56 -------------------------------------------------------------------------------- Multi Wound Chart Details Patient Name: Date of  Service: [name] Puna B. 01/12/2023 2:00 PM Medical Record Number: [id number] Patient Account Number: [id number] Date of Birth/Sex: Treating RN: October 01, 1941 (82 y.o. [name] Primary Care Chalet Kerwin: [name] Other Clinician: Massie Kluver Referring Kaytelyn Glore: Treating Yahir Tavano/Extender: Laural Roes in Treatment: 20 Vital Signs Height(in): Pulse(bpm): 77 Weight(lbs): 172 Blood Pressure(mmHg): 175/88 Body Mass Index(BMI): Temperature(F): 97.7 Respiratory Rate(breaths/min): 18 [Treatment Notes:Wound Assessments Treatment Notes] Electronic Signature(s) Signed: 01/12/2023 4:51:29 PM By: Massie Kluver Entered By: Clifton James,  [name] on 01/12/2023 14:18:00 DAYANARA, SHERRILL B ([id number]) [phone].pdf Page 5 of 7 -------------------------------------------------------------------------------- Multi-Disciplinary Care Plan Details Patient Name: Date of Service: KAMAILE, ZACHOW 01/12/2023 2:00 PM Medical Record Number: [id number] Patient Account Number: [id number] Date of Birth/Sex: Treating RN: [birth year] (82 y.o. [name] Primary Care Davinci Glotfelty: [name] Other Clinician: Massie Kluver Referring Laaibah Wartman: Treating Avey Mcmanamon/Extender: Laural Roes in Treatment: 20 Active Inactive Electronic Signature(s) Signed: 01/12/2023 4:51:29 PM By: Massie Kluver Signed: 01/12/2023 7:35:08 PM By: Gretta Cool BSN, RN, CWS, Kim RN, BSN Entered By: Massie Kluver on 01/12/2023 14:29:52 -------------------------------------------------------------------------------- Pain Assessment Details Patient Name: Date of Service: [name] Puna B. 01/12/2023 2:00 PM Medical Record Number: [id number] Patient Account Number: [id number] Date of Birth/Sex: Treating RN: 19-May-1941 (82 y.o. [name] Primary Care Milayah Krell: [name] Other Clinician: Massie Kluver Referring Jakel Alphin: Treating Khai Arrona/Extender: Laural Roes in Treatment: 20 Active Problems Location of Pain Severity and Description of Pain Patient Has Paino No Site Locations [place], [place] ([id number]) [id number] Page 6 of 7 Pain Management and Medication Current Pain Management: Electronic Signature(s) Signed: 01/12/2023 4:51:29 PM By: Massie Kluver Signed: 01/12/2023 7:35:08 PM By: Gretta Cool, BSN, RN, CWS, Kim RN, BSN Entered By: Massie Kluver on 01/12/2023 14:13:40 -------------------------------------------------------------------------------- Patient/Caregiver Education Details Patient Name: Date of Service: [name] 1/31/2024andnbsp2:00 PM Medical Record Number: [id number] Patient Account Number: [id number] Date of Birth/Gender: Treating RN: 06-04-1941 (82 y.o. [name] Primary Care Physician: [name] Other Clinician: Massie Kluver Referring Physician: Treating Physician/Extender: Laural Roes in Treatment: 20 Education Assessment Education Provided To: Patient Education Topics Provided Venous: Handouts: Controlling Swelling with Compression Stockings Methods: Explain/Verbal Responses: State content correctly Wound/Skin Impairment: Handouts: Other: Your wound is healed. Please call if any further issues arise Methods: Explain/Verbal Responses: State content correctly Electronic Signature(s) Signed: 01/12/2023 4:51:29 PM By: Massie Kluver Entered By: Massie Kluver on 01/12/2023 14:29:14 -------------------------------------------------------------------------------- [place] Details Patient Name: Date of Service: [name] Puna B. 01/12/2023 2:00 PM Medical Record Number: [id number] Patient Account Number: [id number] Date of Birth/Sex: Treating RN: 1941/06/10 (82 y.o. [name] Primary Care Karthika Glasper: [name] Other Clinician: Braidyn, Peace B ([id number]) [id number] Page  7 of 7 Referring Sameka Bagent: Treating Juline Sanderford/Extender: Laural Roes in Treatment: 20 Vital Signs Time Taken: 14:10 Temperature (F): 97.7 Weight (lbs): 172 Pulse (bpm): 77 Respiratory Rate (breaths/min): 18 Blood Pressure (mmHg): 175/88 Reference Range: 80 - 120 mg / dl Electronic Signature(s) Signed: 01/12/2023 4:51:29 PM By: Massie Kluver Entered By: Massie Kluver on 01/12/2023 14:13:35

## 2023-03-02 NOTE — Progress Notes (Unsigned)
08/24/2022 2:55 PM   [name] August 16, 1941 [id number]  Referring provider: Baxter Hire, MD [place],  [place] [address]  Urological history: 1. rUTI's -Contributing factors of age, vaginal atrophy, diabetes, incontinence -Documented urine cultures over the last year  -None -trimethoprim 100 mg daily  2. Mixed incontinence -Contributing factors of age, hysterectomy, vaginal births, diabetes, arthritis, hypertension, vaginal atrophy, pelvic prolapse and lymphedema -Myrbetriq 50 mg daily  3. Nocturia -Risk factors for nocturia: hypertension, diabetes and arthritis  4. Renal cyst -contrast CT (2019) - lower pole of the right kidney there is a 2.1 cm simple cyst.   Chief Complaint  Patient presents with   Hematuria   Urinary Incontinence   Recurrent UTI    HPI: [name] is a 82 y.o. female who presents today for frequent urination, intense pressure to urinate, has not felt well past few days.    CATH UA ***  PMH: Past Medical History:  Diagnosis Date   Arthritis    Basal cell carcinoma 10/27/2010   Right lower medial cheek. Excised: 11/24/2010, margins free.   Breast cancer ([place]) 2000   Cancer ([place])    Diabetes mellitus    GERD (gastroesophageal reflux disease)    Hypertension    Lymph edema    Rt arm   Neuropathy    Osteoporosis    Personal history of chemotherapy 2000   right breast   Personal history of radiation therapy 2000   right breast   Thyroid disease     Surgical History: Past Surgical History:  Procedure Laterality Date   ANTERIOR AND POSTERIOR REPAIR N/A 10/18/2016   Procedure: ANTERIOR (CYSTOCELE) AND POSTERIOR REPAIR (RECTOCELE);  Surgeon: Rubie Maid, MD;  Location: [facility] ORS;  Service: Gynecology;  Laterality: N/A;   APPENDECTOMY     BACK SURGERY  1983   discectomy   BREAST EXCISIONAL BIOPSY Left 12/11/2010   benign.  BENIGN BREAST TISSUE WITH STROMAL FIBROSIS, SCLEROSING ADENOSIS    cartoid u/s   0000000   [id number] LICA stenosis   COLONOSCOPY     DOPPLER ECHOCARDIOGRAPHY  02/24/06   Triv MR, EF 55-60%   FOOT SURGERY  1992 & 1994   bilateral   HEMORROIDECTOMY     MASTECTOMY Right 2000   right, modified radical+ chemo, 2/23 nodes 4/03   miscarriage  1970   mumps  1965   h/o   OTHER SURGICAL HISTORY  04/03/02   dexa, wnl   OTHER SURGICAL HISTORY  03/12/04   dexa, wnl   OTHER SURGICAL HISTORY  08/12/05   dexa-osteopor Hip, osteopen spine   OTHER SURGICAL HISTORY  10/25/07   dexa- nml hip and spine   SEPTOPLASTY  1970   TONSILLECTOMY  1945   VAGINAL DELIVERY     x5   VAGINAL HYSTERECTOMY  1974   partial, fibroids   VENTRAL HERNIA REPAIR      Home Medications:  Allergies as of 08/24/2022       Reactions   Atorvastatin Swelling   Leg swelling Other reaction(s): Unknown Leg swelling Leg swelling   Azithromycin Diarrhea, Nausea And Vomiting   Other reaction(s): Nausea And Vomiting   Ciprofloxacin Other (See Comments)   Does not remember Other reaction(s): Unknown tendonitis tendonitis   Doxycycline Nausea And Vomiting   Hydrocodone Bit-homatrop Mbr    REACTION:    Hydrocodone Bit-homatrop Mbr    Other reaction(s): Other (See Comments) Drowsiness Other reaction(s): Other (See Comments) REACTION:   Rosiglitazone Swelling  Other reaction(s): Unknown REACTION: SWELLING   Rosiglitazone Maleate    REACTION: SWELLING        Medication List        Accurate as of August 24, 2022  2:55 PM. If you have any questions, ask your nurse or doctor.          Accu-Chek Aviva Plus test strip Generic drug: glucose blood USE 1 STRIP TWICE A DAY TO CHECK BLOOD SUGAR   Accu-Chek Aviva Plus w/Device Kit Use to check blood sugar twice daily   accu-chek multiclix lancets Use as instructed to check blood sugar twice daily E11.9   acetaminophen 650 MG CR tablet Commonly known as: Tylenol 8 Hour Take 1 tablet (650 mg total) by mouth every 8 (eight) hours as needed  for pain.   ALPRAZolam 0.5 MG tablet Commonly known as: XANAX Take 0.25 mg by mouth 2 (two) times daily as needed.   aspirin 81 MG tablet Take 81 mg by mouth at bedtime.   BD Pen Needle Nano U/F 32G X 4 MM Misc Generic drug: Insulin Pen Needle USE 1 PEN NEEDLE DAILY   carvedilol 6.25 MG tablet Commonly known as: COREG Take 6.25 mg by mouth 2 (two) times daily.   CENTRUM SILVER PO Take 1 tablet by mouth daily.   clobetasol cream 0.05 % Commonly known as: TEMOVATE APPLY ONE APPLICATION ON THE SKIN TWICE A DAY AS NEEDED. AVOID FACE, GROIN, AND UNDERARMS.   docusate sodium 100 MG capsule Commonly known as: COLACE Take 1 capsule (100 mg total) by mouth 2 (two) times daily as needed for mild constipation.   ezetimibe 10 MG tablet Commonly known as: ZETIA TAKE ONE (1) TABLET EACH DAY   FISH OIL PO Take 1 capsule by mouth daily.   furosemide 20 MG tablet Commonly known as: LASIX Take 20 mg by mouth daily.   insulin glargine 100 UNIT/ML Solostar Pen Commonly known as: Lantus SoloStar INJECT 26 UNITS INTO THE SKIN AT BEDTIME.   levothyroxine 75 MCG tablet Commonly known as: SYNTHROID Take 75 mcg by mouth daily before breakfast.   liraglutide 18 MG/3ML Sopn Commonly known as: Victoza INJECT 1.8 MG INTO THE SKIN DAILY.   meclizine 25 MG tablet Commonly known as: ANTIVERT Take 25 mg by mouth 3 (three) times daily as needed.   Myrbetriq 50 MG Tb24 tablet Generic drug: mirabegron ER TAKE ONE TABLET BY MOUTH EVERY DAY   naproxen sodium 220 MG tablet Commonly known as: ALEVE Take 440 mg by mouth daily as needed.   omeprazole 20 MG capsule Commonly known as: PRILOSEC TAKE ONE (1) CAPSULE EACH DAY   OVER THE COUNTER MEDICATION Take 1 tablet by mouth daily. Immune System Support Supplement includes :  Echinacea Zinc Vitamin C   POTASSIUM PO Take 1 tablet by mouth at bedtime.   sertraline 25 MG tablet Commonly known as: ZOLOFT Take 25 mg by mouth daily.    trimethoprim 100 MG tablet Commonly known as: TRIMPEX Take 1 tablet (100 mg total) by mouth daily.   valsartan 80 MG tablet Commonly known as: DIOVAN Take 80 mg by mouth daily.        Allergies:  Allergies  Allergen Reactions   Atorvastatin Swelling    Leg swelling Other reaction(s): Unknown Leg swelling Leg swelling   Azithromycin Diarrhea and Nausea And Vomiting    Other reaction(s): Nausea And Vomiting   Ciprofloxacin Other (See Comments)    Does not remember Other reaction(s): Unknown tendonitis tendonitis   Doxycycline  Nausea And Vomiting   Hydrocodone Bit-Homatrop Mbr     REACTION:    Hydrocodone Bit-Homatrop Mbr     Other reaction(s): Other (See Comments) Drowsiness Other reaction(s): Other (See Comments) REACTION:   Rosiglitazone Swelling    Other reaction(s): Unknown REACTION: SWELLING   Rosiglitazone Maleate     REACTION: SWELLING    Family History: Family History  Problem Relation Age of Onset   Diabetes Mother    Hypertension Mother    Heart attack Mother    Heart failure Father    Hypertension Father    Stroke Father    Lung cancer Brother        hx of smoking   Stroke Brother    Heart attack Brother        PTCA MI x 2   Breast cancer Paternal Aunt        paternal great aunt    Social History:  reports that she has never smoked. She has never used smokeless tobacco. She reports that she does not drink alcohol and does not use drugs.  ROS: Pertinent ROS in HPI  Physical Exam: BP (!) 172/83   Pulse 91   Ht 5\' 5"  (1.651 m)   Wt 172 lb (78 kg)   BMI 28.62 kg/m   Constitutional:  Well nourished. Alert and oriented, No acute distress. HEENT: [place] AT, moist mucus membranes.  Trachea midline, no masses. Cardiovascular: No clubbing, cyanosis, or edema. Respiratory: Normal respiratory effort, no increased work of breathing. GU: No CVA tenderness.  No bladder fullness or masses. Vulvovaginal atrophy w/ pallor, loss of rugae, introital  retraction, excoriations.  Vulvar thinning, fusion of labia, clitoral hood retraction, prominent urethral meatus.   *** external genitalia, *** pubic hair distribution, no lesions.  Normal urethral meatus, no lesions, no prolapse, no discharge.   No urethral masses, tenderness and/or tenderness. No bladder fullness, tenderness or masses. *** vagina mucosa, *** estrogen effect, no discharge, no lesions, *** pelvic support, *** cystocele and *** rectocele noted.  No cervical motion tenderness.  Uterus is freely mobile and non-fixed.  No adnexal/parametria masses or tenderness noted.  Anus and perineum are without rashes or lesions.   ***  Neurologic: Grossly intact, no focal deficits, moving all 4 extremities. Psychiatric: Normal mood and affect.    Laboratory Data: Urinalysis See Epic and HPI I have reviewed the labs.   Pertinent Imaging: N/A  Procedure In and Out Catheterization  Patient is present today for a I & O catheterization due to recheck on micro heme. Patient was cleaned and prepped in a sterile fashion with betadine . A 14 FR cath was inserted no complications were noted , 130 ml of urine return was noted, urine was yellow in color. A clean urine sample was collected for urinalysis. Bladder was drained  And catheter was removed with out difficulty.    Performed by: Zara Council, PA-C    1. rUTI's -one UTI suffered in 01/2022 -continue Trimethoprim 100 mg daily  2. Mixed incontinence -at goal w/ Myrbetriq 50 mg daily  3. Nocturia -minimal bother  4. Microscopic hematuria -resolved   Return in about 1 year (around 08/25/2023) for PVR and OAB questionnaire.  These notes generated with voice recognition software. I apologize for typographical errors.  [place], [place] [address]  [place],  [address] [phone]

## 2023-03-03 ENCOUNTER — Telehealth: Payer: Self-pay

## 2023-03-03 ENCOUNTER — Ambulatory Visit: Payer: Medicare PPO | Admitting: Urology

## 2023-03-03 ENCOUNTER — Encounter: Payer: Self-pay | Admitting: Urology

## 2023-03-03 VITALS — BP 197/97 | HR 92 | Ht 62.0 in | Wt 176.0 lb

## 2023-03-03 DIAGNOSIS — N3946 Mixed incontinence: Secondary | ICD-10-CM

## 2023-03-03 DIAGNOSIS — R3129 Other microscopic hematuria: Secondary | ICD-10-CM | POA: Diagnosis not present

## 2023-03-03 DIAGNOSIS — N390 Urinary tract infection, site not specified: Secondary | ICD-10-CM | POA: Diagnosis not present

## 2023-03-03 DIAGNOSIS — R351 Nocturia: Secondary | ICD-10-CM

## 2023-03-03 LAB — URINALYSIS, COMPLETE
Bilirubin, UA: NEGATIVE
Glucose, UA: NEGATIVE
Ketones, UA: NEGATIVE
Nitrite, UA: POSITIVE — AB
Protein,UA: NEGATIVE
Specific Gravity, UA: 1.02 (ref 1.005–1.030)
Urobilinogen, Ur: 0.2 mg/dL (ref 0.2–1.0)
pH, UA: 5.5 (ref 5.0–7.5)

## 2023-03-03 LAB — MICROSCOPIC EXAMINATION: WBC, UA: >30 /hpf — AB (ref 0–5)

## 2023-03-03 MED ORDER — CEFUROXIME AXETIL 500 MG PO TABS: 500.0000 mg | ORAL_TABLET | Freq: Two times a day (BID) | ORAL | 0 refills | Status: DC

## 2023-03-03 MED ORDER — FLUCONAZOLE 150 MG PO TABS: 150.0000 mg | ORAL_TABLET | Freq: Once | ORAL | 0 refills | Status: AC

## 2023-03-07 LAB — CULTURE, URINE COMPREHENSIVE

## 2023-03-08 ENCOUNTER — Telehealth: Payer: Self-pay | Admitting: Family Medicine

## 2023-03-08 MED ORDER — NITROFURANTOIN MONOHYD MACRO 100 MG PO CAPS: 100.0000 mg | ORAL_CAPSULE | Freq: Two times a day (BID) | ORAL | 0 refills | Status: DC

## 2023-03-08 NOTE — Telephone Encounter (Signed)
Patient notified and medication was sent to pharmacy. 

## 2023-03-08 NOTE — Telephone Encounter (Signed)
-----   Message from Nori Riis, PA-C sent at 03/08/2023  8:24 AM EDT ----- Please let [name] know that her urine culture was positive for infection.  Unfortunately the septum I prescribed the bacteria is resistant to it so we need to change antibiotics to Macrobid 100 mg twice daily for 7 days.

## 2023-04-18 ENCOUNTER — Other Ambulatory Visit: Payer: Self-pay | Admitting: Urology

## 2023-04-18 DIAGNOSIS — N3020 Other chronic cystitis without hematuria: Secondary | ICD-10-CM

## 2023-05-24 ENCOUNTER — Other Ambulatory Visit: Payer: Self-pay | Admitting: Urology

## 2023-05-24 DIAGNOSIS — N3946 Mixed incontinence: Secondary | ICD-10-CM

## 2023-06-10 ENCOUNTER — Ambulatory Visit: Payer: Medicare PPO | Attending: Cardiology | Admitting: Cardiology

## 2023-06-10 ENCOUNTER — Encounter: Payer: Self-pay | Admitting: Cardiology

## 2023-06-10 VITALS — BP 160/74 | HR 82 | Ht 65.0 in | Wt 186.8 lb

## 2023-06-10 DIAGNOSIS — I10 Essential (primary) hypertension: Secondary | ICD-10-CM

## 2023-06-10 DIAGNOSIS — R600 Localized edema: Secondary | ICD-10-CM

## 2023-06-10 DIAGNOSIS — R0600 Dyspnea, unspecified: Secondary | ICD-10-CM

## 2023-06-10 MED ORDER — FUROSEMIDE 20 MG PO TABS: 20.0000 mg | ORAL_TABLET | Freq: Every day | ORAL | 3 refills | Status: DC

## 2023-06-10 NOTE — Progress Notes (Signed)
Cardiology Office Note:    Date:  06/10/2023   ID:  [name], DOB [birth year], MRN [id number]  PCP:  Alease Medina, MD    [facility] Providers Cardiologist:  Debbe Odea, MD     Referring MD: Alease Medina, MD   Chief Complaint  Patient presents with   New Patient (Initial Visit)    Patient concerned with continued weakness and dyspnea on exertion after wound care in January 2024.  She is more ambulatory now but stamina not improving.  No cardiac history.    History of Present Illness:    [name] is a 82 y.o. female with a hx of hypertension, diabetes, hypothyroidism, breast cancer s/p chemo, neuropathy secondary to chemo presenting with shortness of breath.  Patient fell about 7 months ago, developed a wound on her right ankle.  Has been going through wound care and treatment at the wound center.  Has not exercise much during this time.  Endorse having shortness of breath with minimal exertion, also feels fatigued.  Endorses leg edema for which she takes Lasix 20 mg every other day.  Denies chest pain.  Endorses sensitivity in her skin due to neuropathy.  Also has lymphedema in her arms.  Her blood pressures at home are usually controlled, blood pressure cuff irritates her hand sometimes which typically raises her BP.  Past Medical History:  Diagnosis Date   Arthritis    Basal cell carcinoma 10/27/2010   Right lower medial cheek. Excised: 11/24/2010, margins free.   Breast cancer (HCC) 2000   Cancer (HCC)    Diabetes mellitus    GERD (gastroesophageal reflux disease)    Hypertension    Lymph edema    Rt arm   Neuropathy    Osteoporosis    Personal history of chemotherapy 2000   right breast   Personal history of radiation therapy 2000   right breast   Thyroid disease     Past Surgical History:  Procedure Laterality Date   ANTERIOR AND POSTERIOR REPAIR N/A 10/18/2016   Procedure: ANTERIOR (CYSTOCELE) AND POSTERIOR REPAIR (RECTOCELE);   Surgeon: Hildred Laser, MD;  Location: [facility] ORS;  Service: Gynecology;  Laterality: N/A;   APPENDECTOMY     BACK SURGERY  1983   discectomy   BREAST EXCISIONAL BIOPSY Left 12/11/2010   benign.  BENIGN BREAST TISSUE WITH STROMAL FIBROSIS, SCLEROSING ADENOSIS    cartoid u/s  02/24/06   60-79% LICA stenosis   COLONOSCOPY     DOPPLER ECHOCARDIOGRAPHY  02/24/06   Triv MR, EF 55-60%   FOOT SURGERY  1992 & 1994   bilateral   HEMORROIDECTOMY     MASTECTOMY Right 2000   right, modified radical+ chemo, 2/23 nodes 4/03   miscarriage  1970   mumps  1965   h/o   OTHER SURGICAL HISTORY  04/03/02   dexa, wnl   OTHER SURGICAL HISTORY  03/12/04   dexa, wnl   OTHER SURGICAL HISTORY  08/12/05   dexa-osteopor Hip, osteopen spine   OTHER SURGICAL HISTORY  10/25/07   dexa- nml hip and spine   SEPTOPLASTY  1970   TONSILLECTOMY  1945   VAGINAL DELIVERY     x5   VAGINAL HYSTERECTOMY  1974   partial, fibroids   VENTRAL HERNIA REPAIR      Current Medications: Current Meds  Medication Sig   ACCU-CHEK AVIVA PLUS test strip USE 1 STRIP TWICE A DAY TO CHECK BLOOD SUGAR   acetaminophen (TYLENOL 8 HOUR) 650  MG CR tablet Take 1 tablet (650 mg total) by mouth every 8 (eight) hours as needed for pain.   aspirin 81 MG tablet Take 81 mg by mouth at bedtime.   BD PEN NEEDLE NANO U/F 32G X 4 MM MISC USE 1 PEN NEEDLE DAILY   Blood Glucose Monitoring Suppl (ACCU-CHEK AVIVA PLUS) W/DEVICE KIT Use to check blood sugar twice daily   carvedilol (COREG) 6.25 MG tablet Take 6.25 mg by mouth 2 (two) times daily.   clobetasol cream (TEMOVATE) 0.05 % APPLY ONE APPLICATION ON THE SKIN TWICE A DAY AS NEEDED. AVOID FACE, GROIN, AND UNDERARMS.   co-enzyme Q-10 30 MG capsule Take 200 mg by mouth daily.   cyanocobalamin (VITAMIN B12) 1000 MCG tablet Take 1,000 mcg by mouth daily.   docusate sodium (COLACE) 100 MG capsule Take 1 capsule (100 mg total) by mouth 2 (two) times daily as needed for mild constipation.   ezetimibe  (ZETIA) 10 MG tablet TAKE ONE (1) TABLET EACH DAY   Insulin Glargine (LANTUS SOLOSTAR) 100 UNIT/ML Solostar Pen INJECT 26 UNITS INTO THE SKIN AT BEDTIME.   Lancets (ACCU-CHEK MULTICLIX) lancets Use as instructed to check blood sugar twice daily E11.9   levothyroxine (SYNTHROID, LEVOTHROID) 75 MCG tablet Take 75 mcg by mouth daily before breakfast.   meclizine (ANTIVERT) 25 MG tablet Take 25 mg by mouth 3 (three) times daily as needed.   Multiple Vitamins-Minerals (CENTRUM SILVER PO) Take 1 tablet by mouth daily.   MYRBETRIQ 50 MG TB24 tablet TAKE ONE TABLET BY MOUTH EVERY DAY   naproxen sodium (ANAPROX) 220 MG tablet Take 440 mg by mouth daily as needed.   omeprazole (PRILOSEC) 20 MG capsule TAKE ONE (1) CAPSULE EACH DAY   OVER THE COUNTER MEDICATION Take 1 tablet by mouth daily. Immune System Support Supplement includes :  Echinacea Zinc Vitamin C   POTASSIUM PO Take 1 tablet by mouth at bedtime.   sertraline (ZOLOFT) 25 MG tablet Take 25 mg by mouth daily.   trimethoprim (TRIMPEX) 100 MG tablet TAKE 1 TABLET BY MOUTH DAILY   valsartan (DIOVAN) 160 MG tablet Take 160 mg by mouth daily.   VITAMIN D, CHOLECALCIFEROL, PO Take by mouth.   [DISCONTINUED] furosemide (LASIX) 20 MG tablet Take 20 mg by mouth daily.     Allergies:   Atorvastatin, Azithromycin, Ciprofloxacin, Doxycycline, Hydrocodone bit-homatrop mbr, Hydrocodone bit-homatrop mbr, Rosiglitazone, and Rosiglitazone maleate   Social History   Socioeconomic History   Marital status: Married    Spouse name: Not on file   Number of children: 5   Years of education: Not on file   Highest education level: Not on file  Occupational History   Occupation: Retired Magazine features editor: RETIRED  Tobacco Use   Smoking status: Never   Smokeless tobacco: Never  Vaping Use   Vaping Use: Never used  Substance and Sexual Activity   Alcohol use: No    Alcohol/week: 0.0 standard drinks of alcohol   Drug use: No   Sexual activity: Not  Currently    Birth control/protection: Surgical  Other Topics Concern   Not on file  Social History Narrative   Married 50th anniversary (8/08)   Has a Living Will- all of her children have a copy of it.   Desires CPR but would not want prolonged life support.               Social Determinants of Health   Financial Resource Strain: Low Risk  (02/11/2018)  Overall Financial Resource Strain (CARDIA)    Difficulty of Paying Living Expenses: Not hard at all  Food Insecurity: No Food Insecurity (02/11/2018)   Hunger Vital Sign    Worried About Running Out of Food in the Last Year: Never true    Ran Out of Food in the Last Year: Never true  Transportation Needs: No Transportation Needs (02/11/2018)   PRAPARE - Administrator, Civil Service (Medical): No    Lack of Transportation (Non-Medical): No  Physical Activity: Insufficiently Active (02/11/2018)   Exercise Vital Sign    Days of Exercise per Week: 6 days    Minutes of Exercise per Session: 20 min  Stress: No Stress Concern Present (02/11/2018)   [employer] of Occupational Health - Occupational Stress Questionnaire    Feeling of Stress : Not at all  Social Connections: Socially Integrated (02/11/2018)   Social Connection and Isolation Panel [NHANES]    Frequency of Communication with Friends and Family: More than three times a week    Frequency of Social Gatherings with Friends and Family: More than three times a week    Attends Religious Services: More than 4 times per year    Active Member of [employer] or Organizations: Yes    Attends Banker Meetings: 1 to 4 times per year    Marital Status: Married     Family History: The patient's family history includes Breast cancer in her paternal aunt; Diabetes in her mother; Heart attack in her brother and mother; Heart failure in her father; Hypertension in her father and mother; Lung cancer in her brother; Stroke in her brother and father.  ROS:   Please see the  history of present illness.     All other systems reviewed and are negative.  EKGs/Labs/Other Studies Reviewed:    The following studies were reviewed today:  EKG Interpretation Date/Time:  Friday June 10 2023 14:35:17 EDT Ventricular Rate:  82 PR Interval:  154 QRS Duration:  74 QT Interval:  362 QTC Calculation: 422 R Axis:   8  Text Interpretation: Normal sinus rhythm Normal ECG Confirmed by Debbe Odea ([id number]) on 06/10/2023 2:50:21 PM    Recent Labs: No results found for requested labs within last 365 days.  Recent Lipid Panel    Component Value Date/Time   CHOL 177 03/24/2017 1005   TRIG 102.0 03/24/2017 1005   HDL 46.20 03/24/2017 1005   CHOLHDL 4 03/24/2017 1005   VLDL 20.4 03/24/2017 1005   LDLCALC 111 (H) 03/24/2017 1005     Risk Assessment/Calculations:            Physical Exam:    VS:  BP (!) 160/74 (BP Location: Left Arm, Patient Position: Sitting, Cuff Size: Large)   Pulse 82   Ht 5\' 5"  (1.651 m)   Wt 186 lb 12.8 oz (84.7 kg)   SpO2 98%   BMI 31.09 kg/m     Wt Readings from Last 3 Encounters:  06/10/23 186 lb 12.8 oz (84.7 kg)  03/03/23 176 lb (79.8 kg)  08/24/22 172 lb (78 kg)     GEN:  Well nourished, well developed in no acute distress HEENT: Normal NECK: No JVD; No carotid bruits CARDIAC: RRR, no murmurs, rubs, gallops RESPIRATORY:  Clear to auscultation without rales, wheezing or rhonchi  ABDOMEN: Soft, non-tender, non-distended MUSCULOSKELETAL: 1+ leg edema SKIN: Warm and dry NEUROLOGIC:  Alert and oriented x 3 PSYCHIATRIC:  Normal affect   ASSESSMENT:    1. Dyspnea, unspecified  type   2. Bilateral leg edema   3. Primary hypertension    PLAN:    In order of problems listed above:  Shortness of breath, this could be secondary to deconditioning.  Get echocardiogram to evaluate any structural abnormalities.  If echo is normal, will recommend graduated exercising. Bilateral leg edema, advised to take Lasix 20 mg daily,  check BMP in 10 days. Hypertension, BP elevated, usually controlled.  Continue telmisartan 160 mg,, Coreg 6.25 mg twice daily  Follow-up after echo.      Medication Adjustments/Labs and Tests Ordered: Current medicines are reviewed at length with the patient today.  Concerns regarding medicines are outlined above.  Orders Placed This Encounter  Procedures   Basic Metabolic Panel (BMET)   EKG 12-Lead   ECHOCARDIOGRAM COMPLETE   Meds ordered this encounter  Medications   furosemide (LASIX) 20 MG tablet    Sig: Take 1 tablet (20 mg total) by mouth daily.    Dispense:  90 tablet    Refill:  3    Patient Instructions  Medication Instructions:  Your physician has recommended you make the following change in your medication:   furosemide (LASIX) 20 MG tablet - Take 1 tablet (20 mg total) by mouth daily  *If you need a refill on your cardiac medications before your next appointment, please call your pharmacy*   Lab Work: Your physician recommends that you return for lab work in 10 days: Engineer, civil (consulting) at [facility] 1st desk on the right to check in (REGISTRATION)  Lab hours: Monday- Friday (7:30 am- 5:30 pm)   If you have labs (blood work) drawn today and your tests are completely normal, you will receive your results only by: MyChart Message (if you have MyChart) OR A paper copy in the mail If you have any lab test that is abnormal or we need to change your treatment, we will call you to review the results.   Testing/Procedures: Your physician has requested that you have an echocardiogram. Echocardiography is a painless test that uses sound waves to create images of your heart. It provides your doctor with information about the size and shape of your heart and how well your heart's chambers and valves are working. This procedure takes approximately one hour. There are no restrictions for this procedure. Please do NOT wear cologne, perfume, aftershave, or lotions (deodorant  is allowed). Please arrive 15 minutes prior to your appointment time.    Follow-Up: At [facility], you and your health needs are our priority.  As part of our continuing mission to provide you with exceptional heart care, we have created designated Provider Care Teams.  These Care Teams include your primary Cardiologist (physician) and Advanced Practice Providers (APPs -  Physician Assistants and Nurse Practitioners) who all work together to provide you with the care you need, when you need it.  We recommend signing up for the patient portal called "MyChart".  Sign up information is provided on this After Visit Summary.  MyChart is used to connect with patients for Virtual Visits (Telemedicine).  Patients are able to view lab/test results, encounter notes, upcoming appointments, etc.  Non-urgent messages can be sent to your provider as well.   To learn more about what you can do with MyChart, go to [email or web address].    Your next appointment:   2 month(s)  Provider:   You may see Debbe Odea, MD or one of the following Advanced Practice Providers on your designated Care Team:  Nicolasa Ducking, NP Eula Listen, PA-C Cadence Fransico Michael, PA-C Charlsie Quest, NP    Other Instructions -None    Signed, Debbe Odea, MD  06/10/2023 4:20 PM    [facility]

## 2023-06-10 NOTE — Patient Instructions (Signed)
Medication Instructions:  Your physician has recommended you make the following change in your medication:   furosemide (LASIX) 20 MG tablet - Take 1 tablet (20 mg total) by mouth daily  *If you need a refill on your cardiac medications before your next appointment, please call your pharmacy*   Lab Work: Your physician recommends that you return for lab work in 10 days: Engineer, civil (consulting) at [facility] 1st desk on the right to check in (REGISTRATION)  Lab hours: Monday- Friday (7:30 am- 5:30 pm)   If you have labs (blood work) drawn today and your tests are completely normal, you will receive your results only by: MyChart Message (if you have MyChart) OR A paper copy in the mail If you have any lab test that is abnormal or we need to change your treatment, we will call you to review the results.   Testing/Procedures: Your physician has requested that you have an echocardiogram. Echocardiography is a painless test that uses sound waves to create images of your heart. It provides your doctor with information about the size and shape of your heart and how well your heart's chambers and valves are working. This procedure takes approximately one hour. There are no restrictions for this procedure. Please do NOT wear cologne, perfume, aftershave, or lotions (deodorant is allowed). Please arrive 15 minutes prior to your appointment time.    Follow-Up: At [facility], you and your health needs are our priority.  As part of our continuing mission to provide you with exceptional heart care, we have created designated Provider Care Teams.  These Care Teams include your primary Cardiologist (physician) and Advanced Practice Providers (APPs -  Physician Assistants and Nurse Practitioners) who all work together to provide you with the care you need, when you need it.  We recommend signing up for the patient portal called "MyChart".  Sign up information is provided on this After Visit  Summary.  MyChart is used to connect with patients for Virtual Visits (Telemedicine).  Patients are able to view lab/test results, encounter notes, upcoming appointments, etc.  Non-urgent messages can be sent to your provider as well.   To learn more about what you can do with MyChart, go to [email or web address].    Your next appointment:   2 month(s)  Provider:   You may see Debbe Odea, MD or one of the following Advanced Practice Providers on your designated Care Team:   Nicolasa Ducking, NP Eula Listen, PA-C Cadence Fransico Michael, PA-C Charlsie Quest, NP    Other Instructions -None

## 2023-06-23 ENCOUNTER — Other Ambulatory Visit
Admission: RE | Admit: 2023-06-23 | Discharge: 2023-06-23 | Disposition: A | Discharge status: Home / Self Care | Payer: Medicare PPO | Attending: Cardiology | Admitting: Cardiology

## 2023-06-23 DIAGNOSIS — R0600 Dyspnea, unspecified: Secondary | ICD-10-CM | POA: Diagnosis present

## 2023-06-23 DIAGNOSIS — R600 Localized edema: Secondary | ICD-10-CM | POA: Insufficient documentation

## 2023-06-23 LAB — BASIC METABOLIC PANEL
Anion gap: 9 (ref 5–15)
BUN: 17 mg/dL (ref 8–23)
CO2: 26 mmol/L (ref 22–32)
Calcium: 9.2 mg/dL (ref 8.9–10.3)
Chloride: 98 mmol/L (ref 98–111)
Creatinine, Ser: 0.79 mg/dL (ref 0.44–1.00)
GFR, Estimated: >60 mL/min (ref >60)
Glucose, Bld: 238 mg/dL — ABNORMAL HIGH (ref 70–99)
Potassium: 4.4 mmol/L (ref 3.5–5.1)
Sodium: 133 mmol/L — ABNORMAL LOW (ref 135–145)

## 2023-07-05 ENCOUNTER — Ambulatory Visit: Payer: Medicare PPO | Attending: Cardiology

## 2023-07-05 DIAGNOSIS — R600 Localized edema: Secondary | ICD-10-CM | POA: Diagnosis not present

## 2023-07-05 DIAGNOSIS — R0600 Dyspnea, unspecified: Secondary | ICD-10-CM

## 2023-07-05 LAB — ECHOCARDIOGRAM COMPLETE
Area-P 1/2: 3.37 cm2
S' Lateral: 2.7 cm

## 2023-07-19 ENCOUNTER — Other Ambulatory Visit: Payer: Self-pay | Admitting: Urology

## 2023-07-19 DIAGNOSIS — N3020 Other chronic cystitis without hematuria: Secondary | ICD-10-CM

## 2023-08-18 ENCOUNTER — Ambulatory Visit: Payer: Medicare PPO | Admitting: Cardiology

## 2023-08-25 ENCOUNTER — Ambulatory Visit: Payer: Medicare PPO | Admitting: Urology

## 2023-08-29 ENCOUNTER — Encounter: Payer: Self-pay | Admitting: Student in an Organized Health Care Education/Training Program

## 2023-08-29 ENCOUNTER — Ambulatory Visit: Payer: Medicare PPO | Admitting: Student in an Organized Health Care Education/Training Program

## 2023-08-29 VITALS — BP 160/90 | HR 96 | Temp 97.6°F | Ht 65.0 in | Wt 183.0 lb

## 2023-08-29 DIAGNOSIS — R0602 Shortness of breath: Secondary | ICD-10-CM

## 2023-08-29 DIAGNOSIS — I5032 Chronic diastolic (congestive) heart failure: Secondary | ICD-10-CM

## 2023-08-29 DIAGNOSIS — E119 Type 2 diabetes mellitus without complications: Secondary | ICD-10-CM | POA: Diagnosis not present

## 2023-08-29 NOTE — Progress Notes (Unsigned)
08/30/2023 4:38 PM   [name] 11-14-1941 [id number]  Referring provider: Gracelyn Nurse, MD [address] [place],  [place] [address]  Urological history: 1. rUTI's -Contributing factors of age, vaginal atrophy, diabetes, incontinence -Documented urine cultures over the last year  March 03, 2023-Staphylococcus epidermidis  -None -trimethoprim 100 mg daily  2. Mixed incontinence -Contributing factors of age, hysterectomy, vaginal births, diabetes, arthritis, hypertension, vaginal atrophy, pelvic prolapse and lymphedema -Myrbetriq 50 mg daily  3. Nocturia -Risk factors for nocturia: hypertension, diabetes and arthritis  4. Renal cyst -contrast CT (2019) - lower pole of the right kidney there is a 2.1 cm simple cyst.   No chief complaint on file.   HPI: [name] is a 82 y.o. female who presents today for follow up.  Previous records reviewed.  Experiencing shortness of breath, being followed by cardiology and pulmonology.  CATH UA  ***   PMH: Past Medical History:  Diagnosis Date   Arthritis    Basal cell carcinoma 10/27/2010   Right lower medial cheek. Excised: 11/24/2010, margins free.   Breast cancer (HCC) 2000   Cancer (HCC)    Diabetes mellitus    GERD (gastroesophageal reflux disease)    Hypertension    Lymph edema    Rt arm   Neuropathy    Osteoporosis    Personal history of chemotherapy 2000   right breast   Personal history of radiation therapy 2000   right breast   Thyroid disease     Surgical History: Past Surgical History:  Procedure Laterality Date   ANTERIOR AND POSTERIOR REPAIR N/A 10/18/2016   Procedure: ANTERIOR (CYSTOCELE) AND POSTERIOR REPAIR (RECTOCELE);  Surgeon: Hildred Laser, MD;  Location: [facility] ORS;  Service: Gynecology;  Laterality: N/A;   APPENDECTOMY     BACK SURGERY  1983   discectomy   BREAST EXCISIONAL BIOPSY Left 12/11/2010   benign.  BENIGN BREAST TISSUE WITH STROMAL FIBROSIS, SCLEROSING ADENOSIS     cartoid u/s  02/24/06   60-79% LICA stenosis   COLONOSCOPY     DOPPLER ECHOCARDIOGRAPHY  02/24/06   Triv MR, EF 55-60%   FOOT SURGERY  1992 & 1994   bilateral   HEMORROIDECTOMY     MASTECTOMY Right 2000   right, modified radical+ chemo, 2/23 nodes 4/03   miscarriage  1970   mumps  1965   h/o   OTHER SURGICAL HISTORY  04/03/02   dexa, wnl   OTHER SURGICAL HISTORY  03/12/04   dexa, wnl   OTHER SURGICAL HISTORY  08/12/05   dexa-osteopor Hip, osteopen spine   OTHER SURGICAL HISTORY  10/25/07   dexa- nml hip and spine   SEPTOPLASTY  1970   TONSILLECTOMY  1945   VAGINAL DELIVERY     x5   VAGINAL HYSTERECTOMY  1974   partial, fibroids   VENTRAL HERNIA REPAIR      Home Medications:  Allergies as of 08/30/2023       Reactions   Atorvastatin Swelling   Leg swelling Other reaction(s): Unknown Leg swelling Leg swelling   Azithromycin Diarrhea, Nausea And Vomiting   Other reaction(s): Nausea And Vomiting   Ciprofloxacin Other (See Comments)   Does not remember Other reaction(s): Unknown tendonitis tendonitis   Doxycycline Nausea And Vomiting   Hydrocodone Bit-homatrop Mbr    REACTION:    Hydrocodone Bit-homatrop Mbr    Other reaction(s): Other (See Comments) Drowsiness Other reaction(s): Other (See Comments) REACTION:   Rosiglitazone Swelling   Other reaction(s):  Unknown REACTION: SWELLING   Rosiglitazone Maleate    REACTION: SWELLING        Medication List        Accurate as of August 29, 2023  4:38 PM. If you have any questions, ask your nurse or doctor.          Accu-Chek Aviva Plus test strip Generic drug: glucose blood USE 1 STRIP TWICE A DAY TO CHECK BLOOD SUGAR   Accu-Chek Aviva Plus w/Device Kit Use to check blood sugar twice daily   accu-chek multiclix lancets Use as instructed to check blood sugar twice daily E11.9   acetaminophen 650 MG CR tablet Commonly known as: Tylenol 8 Hour Take 1 tablet (650 mg total) by mouth every 8 (eight)  hours as needed for pain.   aspirin 81 MG tablet Take 81 mg by mouth at bedtime.   BD Pen Needle Nano U/F 32G X 4 MM Misc Generic drug: Insulin Pen Needle USE 1 PEN NEEDLE DAILY   carvedilol 6.25 MG tablet Commonly known as: COREG Take 6.25 mg by mouth 2 (two) times daily.   CENTRUM SILVER PO Take 1 tablet by mouth daily.   clobetasol cream 0.05 % Commonly known as: TEMOVATE APPLY ONE APPLICATION ON THE SKIN TWICE A DAY AS NEEDED. AVOID FACE, GROIN, AND UNDERARMS.   co-enzyme Q-10 30 MG capsule Take 200 mg by mouth daily.   cyanocobalamin 1000 MCG tablet Commonly known as: VITAMIN B12 Take 1,000 mcg by mouth daily.   docusate sodium 100 MG capsule Commonly known as: COLACE Take 1 capsule (100 mg total) by mouth 2 (two) times daily as needed for mild constipation.   ezetimibe 10 MG tablet Commonly known as: ZETIA TAKE ONE (1) TABLET EACH DAY   furosemide 20 MG tablet Commonly known as: LASIX Take 1 tablet (20 mg total) by mouth daily.   insulin glargine 100 UNIT/ML Solostar Pen Commonly known as: Lantus SoloStar INJECT 26 UNITS INTO THE SKIN AT BEDTIME.   levothyroxine 75 MCG tablet Commonly known as: SYNTHROID Take 75 mcg by mouth daily before breakfast.   meclizine 25 MG tablet Commonly known as: ANTIVERT Take 25 mg by mouth 3 (three) times daily as needed.   Myrbetriq 50 MG Tb24 tablet Generic drug: mirabegron ER TAKE ONE TABLET BY MOUTH EVERY DAY   naproxen sodium 220 MG tablet Commonly known as: ALEVE Take 440 mg by mouth daily as needed.   omeprazole 20 MG capsule Commonly known as: PRILOSEC TAKE ONE (1) CAPSULE EACH DAY   OVER THE COUNTER MEDICATION Take 1 tablet by mouth daily. Immune System Support Supplement includes :  Echinacea Zinc Vitamin C   POTASSIUM PO Take 1 tablet by mouth at bedtime.   sertraline 25 MG tablet Commonly known as: ZOLOFT Take 25 mg by mouth daily.   trimethoprim 100 MG tablet Commonly known as: TRIMPEX TAKE  1 TABLET BY MOUTH DAILY   valsartan 160 MG tablet Commonly known as: DIOVAN Take 160 mg by mouth daily.   VITAMIN D (CHOLECALCIFEROL) PO Take by mouth.        Allergies:  Allergies  Allergen Reactions   Atorvastatin Swelling    Leg swelling Other reaction(s): Unknown Leg swelling Leg swelling   Azithromycin Diarrhea and Nausea And Vomiting    Other reaction(s): Nausea And Vomiting   Ciprofloxacin Other (See Comments)    Does not remember Other reaction(s): Unknown tendonitis tendonitis   Doxycycline Nausea And Vomiting   Hydrocodone Bit-Homatrop Mbr  REACTION:    Hydrocodone Bit-Homatrop Mbr     Other reaction(s): Other (See Comments) Drowsiness Other reaction(s): Other (See Comments) REACTION:   Rosiglitazone Swelling    Other reaction(s): Unknown REACTION: SWELLING   Rosiglitazone Maleate     REACTION: SWELLING    Family History: Family History  Problem Relation Age of Onset   Diabetes Mother    Hypertension Mother    Heart attack Mother    Heart failure Father    Hypertension Father    Stroke Father    Lung cancer Brother        hx of smoking   Stroke Brother    Heart attack Brother        PTCA MI x 2   Breast cancer Paternal Aunt        paternal great aunt    Social History:  reports that she has never smoked. She has never used smokeless tobacco. She reports that she does not drink alcohol and does not use drugs.  ROS: Pertinent ROS in HPI  Physical Exam: There were no vitals taken for this visit.  Constitutional:  Well nourished. Alert and oriented, No acute distress. HEENT: [place] AT, moist mucus membranes.  Trachea midline, no masses. Cardiovascular: No clubbing, cyanosis, or edema. Respiratory: Normal respiratory effort, no increased work of breathing. GU: No CVA tenderness.  No bladder fullness or masses.  Recession of labia minora, dry, pale vulvar vaginal mucosa and loss of mucosal ridges and folds.  Normal urethral meatus, no  lesions, no prolapse, no discharge.   No urethral masses, tenderness and/or tenderness. No bladder fullness, tenderness or masses. *** vagina mucosa, *** estrogen effect, no discharge, no lesions, *** pelvic support, *** cystocele and *** rectocele noted.  No cervical motion tenderness.  Uterus is freely mobile and non-fixed.  No adnexal/parametria masses or tenderness noted.  Anus and perineum are without rashes or lesions.   ***  Neurologic: Grossly intact, no focal deficits, moving all 4 extremities. Psychiatric: Normal mood and affect.    Laboratory Data: Urinalysis See Epic and HPI I have reviewed the labs.   Pertinent Imaging: N/A    1. rUTI's -UA grossly infected -urine sent for culture -start Ceftin 250 mg twice daily for seven days -given a Diflucan for yeast -will contact her w/ urine culture results    No follow-ups on file.  These notes generated with voice recognition software. I apologize for typographical errors.  Cloretta Ned  [facility] [address]  [address] [place], [place] [address] [phone]

## 2023-08-29 NOTE — Progress Notes (Signed)
Synopsis: Referred in for shortness of breath by Ziglar, Eli Phillips, MD  Assessment & Plan:   # Shortness of breath # HFpEF  She is presenting for the evaluation of shortness of breath especially with exertion with associated lower extremity edema.  Lung exam is clear without any rales, crackles, or wheezing.  Echocardiogram from July 2024 was within normal albeit with signs of grade 1 diastolic dysfunction.  She is maintained on furosemide every other day for her lower extremity edema and heart failure.  I will expand her workup to include pulmonary function testing (spirometry, lung volumes, DLCO) to assess for signs of obstruction and/or restriction.  Should pulmonary function testing suggest either, we will consider further management and testing as indicated.  I did discuss with the patient and her daughter that given her history of diabetes and hypertension as well as her age and weight, she is at high risk for the development of heart failure with preserved ejection fraction.  We went over dietary choices in this situation and I stressed the importance of salt avoidance.  Patient reports cold cuts and use of canned vegetables in her diet which I encouraged against.  -PFT (spirometry, lung volumes, DLCO)  Return in about 4 weeks (around 09/26/2023).  I spent 60 minutes caring for this patient today, including preparing to see the patient, obtaining a medical history , reviewing a separately obtained history, performing a medically appropriate examination and/or evaluation, counseling and educating the patient/family/caregiver, ordering medications, tests, or procedures, documenting clinical information in the electronic health record, and independently interpreting results (not separately reported/billed) and communicating results to the patient/family/caregiver  Raechel Chute, MD [facility] 08/29/2023 1:59 PM    End of visit medications:  No orders of the defined  types were placed in this encounter.    Current Outpatient Medications:    ACCU-CHEK AVIVA PLUS test strip, USE 1 STRIP TWICE A DAY TO CHECK BLOOD SUGAR, Disp: 100 each, Rfl: 0   acetaminophen (TYLENOL 8 HOUR) 650 MG CR tablet, Take 1 tablet (650 mg total) by mouth every 8 (eight) hours as needed for pain., Disp: 30 tablet, Rfl: 1   aspirin 81 MG tablet, Take 81 mg by mouth at bedtime., Disp: , Rfl:    BD PEN NEEDLE NANO U/F 32G X 4 MM MISC, USE 1 PEN NEEDLE DAILY, Disp: 100 each, Rfl: 3   Blood Glucose Monitoring Suppl (ACCU-CHEK AVIVA PLUS) W/DEVICE KIT, Use to check blood sugar twice daily, Disp: , Rfl:    carvedilol (COREG) 6.25 MG tablet, Take 6.25 mg by mouth 2 (two) times daily., Disp: , Rfl:    clobetasol cream (TEMOVATE) 0.05 %, APPLY ONE APPLICATION ON THE SKIN TWICE A DAY AS NEEDED. AVOID FACE, GROIN, AND UNDERARMS., Disp: 30 g, Rfl: 0   co-enzyme Q-10 30 MG capsule, Take 200 mg by mouth daily., Disp: , Rfl:    cyanocobalamin (VITAMIN B12) 1000 MCG tablet, Take 1,000 mcg by mouth daily., Disp: , Rfl:    docusate sodium (COLACE) 100 MG capsule, Take 1 capsule (100 mg total) by mouth 2 (two) times daily as needed for mild constipation., Disp: 60 capsule, Rfl: 2   ezetimibe (ZETIA) 10 MG tablet, TAKE ONE (1) TABLET EACH DAY, Disp: 30 tablet, Rfl: 6   furosemide (LASIX) 20 MG tablet, Take 1 tablet (20 mg total) by mouth daily., Disp: 90 tablet, Rfl: 3   Insulin Glargine (LANTUS SOLOSTAR) 100 UNIT/ML Solostar Pen, INJECT 26 UNITS INTO THE SKIN AT  BEDTIME., Disp: 15 mL, Rfl: 0   Lancets (ACCU-CHEK MULTICLIX) lancets, Use as instructed to check blood sugar twice daily E11.9, Disp: 102 each, Rfl: 3   levothyroxine (SYNTHROID, LEVOTHROID) 75 MCG tablet, Take 75 mcg by mouth daily before breakfast., Disp: , Rfl:    meclizine (ANTIVERT) 25 MG tablet, Take 25 mg by mouth 3 (three) times daily as needed., Disp: , Rfl:    Multiple Vitamins-Minerals (CENTRUM SILVER PO), Take 1 tablet by mouth  daily., Disp: , Rfl:    MYRBETRIQ 50 MG TB24 tablet, TAKE ONE TABLET BY MOUTH EVERY DAY, Disp: 90 tablet, Rfl: 3   naproxen sodium (ANAPROX) 220 MG tablet, Take 440 mg by mouth daily as needed., Disp: , Rfl:    omeprazole (PRILOSEC) 20 MG capsule, TAKE ONE (1) CAPSULE EACH DAY, Disp: 90 capsule, Rfl: 2   OVER THE COUNTER MEDICATION, Take 1 tablet by mouth daily. Immune System Support Supplement includes :  Echinacea Zinc Vitamin C, Disp: , Rfl:    POTASSIUM PO, Take 1 tablet by mouth at bedtime., Disp: , Rfl:    sertraline (ZOLOFT) 25 MG tablet, Take 25 mg by mouth daily., Disp: , Rfl:    trimethoprim (TRIMPEX) 100 MG tablet, TAKE 1 TABLET BY MOUTH DAILY, Disp: 90 tablet, Rfl: 0   valsartan (DIOVAN) 160 MG tablet, Take 160 mg by mouth daily., Disp: , Rfl:    VITAMIN D, CHOLECALCIFEROL, PO, Take by mouth., Disp: , Rfl:    Subjective:   PATIENT ID: [name] GENDER: female DOB: 1941/01/29, MRN: [id number]  Chief Complaint  Patient presents with   Consult    Shortness of breath with exertion.     HPI  Patient is a pleasant 82 year old female presenting to clinic for the evaluation of shortness of breath.  She reports symptoms that started following a period of prolonged immobility secondary to significant lower extremity edema.  She felt that she was in her usual state of health prior to that but afterwards she could not move without being short of breath.  This has slowly improved but she continues to feel short of breath especially with exertion.  She has an occasional cough that is dry and nonproductive.  She does not have any chest pain or chest tightness, denies any wheezing, fevers, chills, or night sweats.  She has chronic bilateral lower extremity edema for which she is maintained on furosemide every other day.  She has been seen and followed by cardiology as well as by her primary care physician.  An echocardiogram performed in July 2024 was overall normal with signs of grade 1  diastolic dysfunction. She's had lower extremity edema with subsequent development of ulcers that required multiple visits to the wound center. She'd been asked to elevated her legs to decrease the edema, and this resulted in significant immobility. She reports that her shortness of breath got worse after this.  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  She did not work in Tax inspector jobs or any mills.  She has 5 children.  She denies any past medical history of smoking or other inhalational exposures.  Ancillary information including prior medications, full medical/surgical/family/social histories, and PFTs (when available) are listed below and have been reviewed.   Review of Systems  Constitutional:  Positive for malaise/fatigue. Negative for chills, fever and weight loss.  Respiratory:  Positive for shortness of breath. Negative for cough.   Cardiovascular:  Positive for leg swelling. Negative for chest pain.  Neurological:  Positive for dizziness.     Objective:   Vitals:   08/29/23 1335  BP: (!) 160/90  Pulse: 96  Temp: 97.6 F (36.4 C)  TempSrc: Temporal  SpO2: 96%  Weight: 183 lb (83 kg)  Height: 5\' 5"  (1.651 m)   96% on RA BMI Readings from Last 3 Encounters:  08/29/23 30.45 kg/m  06/10/23 31.09 kg/m  03/03/23 32.19 kg/m   Wt Readings from Last 3 Encounters:  08/29/23 183 lb (83 kg)  06/10/23 186 lb 12.8 oz (84.7 kg)  03/03/23 176 lb (79.8 kg)    Physical Exam Constitutional:      Appearance: She is obese. She is not ill-appearing.  Cardiovascular:     Rate and Rhythm: Normal rate and regular rhythm.     Pulses: Normal pulses.     Heart sounds: Normal heart sounds.  Pulmonary:     Effort: Pulmonary effort is normal.     Breath sounds: Normal breath sounds. No rales.  Musculoskeletal:     Right lower leg: Edema present.     Left lower leg: Edema present.  Neurological:     Mental Status: She is alert.       Ancillary  Information    Past Medical History:  Diagnosis Date   Arthritis    Basal cell carcinoma 10/27/2010   Right lower medial cheek. Excised: 11/24/2010, margins free.   Breast cancer (HCC) 2000   Cancer (HCC)    Diabetes mellitus    GERD (gastroesophageal reflux disease)    Hypertension    Lymph edema    Rt arm   Neuropathy    Osteoporosis    Personal history of chemotherapy 2000   right breast   Personal history of radiation therapy 2000   right breast   Thyroid disease      Family History  Problem Relation Age of Onset   Diabetes Mother    Hypertension Mother    Heart attack Mother    Heart failure Father    Hypertension Father    Stroke Father    Lung cancer Brother        hx of smoking   Stroke Brother    Heart attack Brother        PTCA MI x 2   Breast cancer Paternal Aunt        paternal great aunt     Past Surgical History:  Procedure Laterality Date   ANTERIOR AND POSTERIOR REPAIR N/A 10/18/2016   Procedure: ANTERIOR (CYSTOCELE) AND POSTERIOR REPAIR (RECTOCELE);  Surgeon: Hildred Laser, MD;  Location: [facility] ORS;  Service: Gynecology;  Laterality: N/A;   APPENDECTOMY     BACK SURGERY  1983   discectomy   BREAST EXCISIONAL BIOPSY Left 12/11/2010   benign.  BENIGN BREAST TISSUE WITH STROMAL FIBROSIS, SCLEROSING ADENOSIS    cartoid u/s  02/24/06   60-79% LICA stenosis   COLONOSCOPY     DOPPLER ECHOCARDIOGRAPHY  02/24/06   Triv MR, EF 55-60%   FOOT SURGERY  1992 & 1994   bilateral   HEMORROIDECTOMY     MASTECTOMY Right 2000   right, modified radical+ chemo, 2/23 nodes 4/03   miscarriage  1970   mumps  1965   h/o   OTHER SURGICAL HISTORY  04/03/02   dexa, wnl   OTHER SURGICAL HISTORY  03/12/04   dexa, wnl   OTHER SURGICAL HISTORY  08/12/05   dexa-osteopor Hip, osteopen spine   OTHER SURGICAL HISTORY  10/25/07   dexa- nml hip  and spine   SEPTOPLASTY  1970   TONSILLECTOMY  1945   VAGINAL DELIVERY     x5   VAGINAL HYSTERECTOMY  1974   partial, fibroids    VENTRAL HERNIA REPAIR      Social History   Socioeconomic History   Marital status: Married    Spouse name: Not on file   Number of children: 5   Years of education: Not on file   Highest education level: Not on file  Occupational History   Occupation: Retired Magazine features editor: RETIRED  Tobacco Use   Smoking status: Never   Smokeless tobacco: Never  Vaping Use   Vaping status: Never Used  Substance and Sexual Activity   Alcohol use: No    Alcohol/week: 0.0 standard drinks of alcohol   Drug use: No   Sexual activity: Not Currently    Birth control/protection: Surgical  Other Topics Concern   Not on file  Social History Narrative   Married 50th anniversary (8/08)   Has a Living Will- all of her children have a copy of it.   Desires CPR but would not want prolonged life support.               Social Determinants of Health   Financial Resource Strain: Low Risk  (02/11/2018)   Overall Financial Resource Strain (CARDIA)    Difficulty of Paying Living Expenses: Not hard at all  Food Insecurity: No Food Insecurity (02/11/2018)   Hunger Vital Sign    Worried About Running Out of Food in the Last Year: Never true    Ran Out of Food in the Last Year: Never true  Transportation Needs: No Transportation Needs (02/11/2018)   PRAPARE - Administrator, Civil Service (Medical): No    Lack of Transportation (Non-Medical): No  Physical Activity: Insufficiently Active (02/11/2018)   Exercise Vital Sign    Days of Exercise per Week: 6 days    Minutes of Exercise per Session: 20 min  Stress: No Stress Concern Present (02/11/2018)   [employer] of Occupational Health - Occupational Stress Questionnaire    Feeling of Stress : Not at all  Social Connections: Socially Integrated (02/11/2018)   Social Connection and Isolation Panel [NHANES]    Frequency of Communication with Friends and Family: More than three times a week    Frequency of Social Gatherings with Friends and  Family: More than three times a week    Attends Religious Services: More than 4 times per year    Active Member of [employer] or Organizations: Yes    Attends Banker Meetings: 1 to 4 times per year    Marital Status: Married  Catering manager Violence: Not At Risk (02/11/2018)   Humiliation, Afraid, Rape, and Kick questionnaire    Fear of Current or Ex-Partner: No    Emotionally Abused: No    Physically Abused: No    Sexually Abused: No     Allergies  Allergen Reactions   Atorvastatin Swelling    Leg swelling Other reaction(s): Unknown Leg swelling Leg swelling   Azithromycin Diarrhea and Nausea And Vomiting    Other reaction(s): Nausea And Vomiting   Ciprofloxacin Other (See Comments)    Does not remember Other reaction(s): Unknown tendonitis tendonitis   Doxycycline Nausea And Vomiting   Hydrocodone Bit-Homatrop Mbr     REACTION:    Hydrocodone Bit-Homatrop Mbr     Other reaction(s): Other (See Comments) Drowsiness Other reaction(s): Other (See Comments) REACTION:  Rosiglitazone Swelling    Other reaction(s): Unknown REACTION: SWELLING   Rosiglitazone Maleate     REACTION: SWELLING     CBC    Component Value Date/Time   WBC 11.7 (H) 10/22/2019 1349   RBC 4.52 10/22/2019 1349   HGB 13.4 10/22/2019 1349   HGB 13.3 01/07/2014 1043   HGB 13.1 12/02/2008 1107   HCT 38.9 10/22/2019 1349   HCT 40.1 01/07/2014 1043   HCT 39.6 12/02/2008 1107   PLT 381 10/22/2019 1349   PLT 359 01/07/2014 1043   PLT 330 12/02/2008 1107   MCV 86.1 10/22/2019 1349   MCV 87 01/07/2014 1043   MCV 83 12/02/2008 1107   MCH 29.6 10/22/2019 1349   MCHC 34.4 10/22/2019 1349   RDW 12.3 10/22/2019 1349   RDW 13.7 01/07/2014 1043   RDW 13.1 12/02/2008 1107   LYMPHSABS 1.8 10/22/2019 1349   LYMPHSABS 2.3 01/07/2014 1043   LYMPHSABS 1.8 12/02/2008 1107   MONOABS 1.1 (H) 10/22/2019 1349   MONOABS 0.8 01/07/2014 1043   EOSABS 0.1 10/22/2019 1349   EOSABS 0.2 01/07/2014 1043    EOSABS 0.2 12/02/2008 1107   BASOSABS 0.1 10/22/2019 1349   BASOSABS 0.1 01/07/2014 1043   BASOSABS 0.1 12/02/2008 1107    Pulmonary Functions Testing Results:     No data to display          Outpatient Medications Prior to Visit  Medication Sig Dispense Refill   ACCU-CHEK AVIVA PLUS test strip USE 1 STRIP TWICE A DAY TO CHECK BLOOD SUGAR 100 each 0   acetaminophen (TYLENOL 8 HOUR) 650 MG CR tablet Take 1 tablet (650 mg total) by mouth every 8 (eight) hours as needed for pain. 30 tablet 1   aspirin 81 MG tablet Take 81 mg by mouth at bedtime.     BD PEN NEEDLE NANO U/F 32G X 4 MM MISC USE 1 PEN NEEDLE DAILY 100 each 3   Blood Glucose Monitoring Suppl (ACCU-CHEK AVIVA PLUS) W/DEVICE KIT Use to check blood sugar twice daily     carvedilol (COREG) 6.25 MG tablet Take 6.25 mg by mouth 2 (two) times daily.     clobetasol cream (TEMOVATE) 0.05 % APPLY ONE APPLICATION ON THE SKIN TWICE A DAY AS NEEDED. AVOID FACE, GROIN, AND UNDERARMS. 30 g 0   co-enzyme Q-10 30 MG capsule Take 200 mg by mouth daily.     cyanocobalamin (VITAMIN B12) 1000 MCG tablet Take 1,000 mcg by mouth daily.     docusate sodium (COLACE) 100 MG capsule Take 1 capsule (100 mg total) by mouth 2 (two) times daily as needed for mild constipation. 60 capsule 2   ezetimibe (ZETIA) 10 MG tablet TAKE ONE (1) TABLET EACH DAY 30 tablet 6   furosemide (LASIX) 20 MG tablet Take 1 tablet (20 mg total) by mouth daily. 90 tablet 3   Insulin Glargine (LANTUS SOLOSTAR) 100 UNIT/ML Solostar Pen INJECT 26 UNITS INTO THE SKIN AT BEDTIME. 15 mL 0   Lancets (ACCU-CHEK MULTICLIX) lancets Use as instructed to check blood sugar twice daily E11.9 102 each 3   levothyroxine (SYNTHROID, LEVOTHROID) 75 MCG tablet Take 75 mcg by mouth daily before breakfast.     meclizine (ANTIVERT) 25 MG tablet Take 25 mg by mouth 3 (three) times daily as needed.     Multiple Vitamins-Minerals (CENTRUM SILVER PO) Take 1 tablet by mouth daily.     MYRBETRIQ 50 MG  TB24 tablet TAKE ONE TABLET BY MOUTH EVERY DAY 90 tablet  3   naproxen sodium (ANAPROX) 220 MG tablet Take 440 mg by mouth daily as needed.     omeprazole (PRILOSEC) 20 MG capsule TAKE ONE (1) CAPSULE EACH DAY 90 capsule 2   OVER THE COUNTER MEDICATION Take 1 tablet by mouth daily. Immune System Support Supplement includes :  Echinacea Zinc Vitamin C     POTASSIUM PO Take 1 tablet by mouth at bedtime.     sertraline (ZOLOFT) 25 MG tablet Take 25 mg by mouth daily.     trimethoprim (TRIMPEX) 100 MG tablet TAKE 1 TABLET BY MOUTH DAILY 90 tablet 0   valsartan (DIOVAN) 160 MG tablet Take 160 mg by mouth daily.     VITAMIN D, CHOLECALCIFEROL, PO Take by mouth.     No facility-administered medications prior to visit.

## 2023-08-30 ENCOUNTER — Ambulatory Visit: Payer: Medicare PPO | Admitting: Urology

## 2023-08-30 ENCOUNTER — Encounter: Payer: Self-pay | Admitting: Urology

## 2023-08-30 VITALS — BP 161/75 | HR 96

## 2023-08-30 DIAGNOSIS — R300 Dysuria: Secondary | ICD-10-CM

## 2023-08-30 DIAGNOSIS — N390 Urinary tract infection, site not specified: Secondary | ICD-10-CM

## 2023-08-30 DIAGNOSIS — N952 Postmenopausal atrophic vaginitis: Secondary | ICD-10-CM | POA: Diagnosis not present

## 2023-08-30 DIAGNOSIS — Z87440 Personal history of urinary (tract) infections: Secondary | ICD-10-CM

## 2023-08-30 DIAGNOSIS — N8189 Other female genital prolapse: Secondary | ICD-10-CM

## 2023-08-30 LAB — URINALYSIS, COMPLETE
Bilirubin, UA: NEGATIVE
Ketones, UA: NEGATIVE
Nitrite, UA: POSITIVE — AB
RBC, UA: NEGATIVE
Specific Gravity, UA: 1.01 (ref 1.005–1.030)
Urobilinogen, Ur: 1 mg/dL (ref 0.2–1.0)
pH, UA: 7 (ref 5.0–7.5)

## 2023-08-30 LAB — MICROSCOPIC EXAMINATION: Epithelial Cells (non renal): >10 /HPF — AB (ref 0–10)

## 2023-09-01 ENCOUNTER — Telehealth: Payer: Self-pay | Admitting: Student in an Organized Health Care Education/Training Program

## 2023-09-01 NOTE — Telephone Encounter (Signed)
[name] needs approval to start physical therapy on 09/05/2023. [name] phone number is [phone]. May leave detailed message on voicemail.

## 2023-09-01 NOTE — Telephone Encounter (Signed)
[name] states patient's physical therapy will start tomorrow. Christy phone number is [phone].

## 2023-09-01 NOTE — Telephone Encounter (Signed)
FYI

## 2023-09-02 LAB — CULTURE, URINE COMPREHENSIVE

## 2023-09-02 NOTE — Telephone Encounter (Signed)
Spoke to [place] with [facility]. She is requesting verbal to start PT 09/05/2023.  Dr. Aundria Rud, please advise. Thanks

## 2023-09-05 ENCOUNTER — Telehealth: Payer: Self-pay | Admitting: Student in an Organized Health Care Education/Training Program

## 2023-09-05 NOTE — Telephone Encounter (Signed)
Received verbal from Dr. Larinda Buttery verbally. Melissa has made [facility] aware. Nothing further needed.

## 2023-09-05 NOTE — Telephone Encounter (Signed)
Verbal ok given to start pT

## 2023-09-05 NOTE — Telephone Encounter (Signed)
[name] calling in for Patient Plan Of Care  Leave orders on Vm

## 2023-09-05 NOTE — Telephone Encounter (Signed)
Lm x1 for [name].

## 2023-09-05 NOTE — Telephone Encounter (Signed)
Dr. Larinda Buttery, can you give verbal for patient to start PT today. Dr. Aundria Rud is unavailable.

## 2023-09-06 NOTE — Telephone Encounter (Signed)
Lm x2 for [name]. Will close encounter per office protocol.

## 2023-09-07 ENCOUNTER — Telehealth: Payer: Self-pay | Admitting: Student in an Organized Health Care Education/Training Program

## 2023-09-07 NOTE — Telephone Encounter (Signed)
Per Dr. Aundria Rud verbally- okay to give verbal for PT plan.  [name] is aware and voiced her understanding.  Nothing further needed.

## 2023-09-07 NOTE — Telephone Encounter (Signed)
[name] needs verbal order for physical therapy. [name] phone number is [phone]. May leave detailed message on voicemail.

## 2023-09-07 NOTE — Telephone Encounter (Signed)
Lm x1 for [name] with [facility].

## 2023-09-07 NOTE — Telephone Encounter (Signed)
Home Health calling again. Sounds frustrated you did not leave a ,message w/verbal OK on her secure answ machine. Please try again.

## 2023-09-07 NOTE — Telephone Encounter (Signed)
Spoke to [place] with [facility].  She is requesting verbal for 2 week 2 1 week 2.   Dr. Aundria Rud, please advise.

## 2023-09-19 ENCOUNTER — Telehealth: Payer: Self-pay | Admitting: Internal Medicine

## 2023-09-19 NOTE — Telephone Encounter (Signed)
Dr. Aundria Rud, please advise if okay to give verbal to continue PT.

## 2023-09-19 NOTE — Telephone Encounter (Signed)
Emily(home health nurse) is asking for an extension on the order to go 1x a week for 2 weeks past next week. Pt doing well but she feels she could benefit from a little more time. If [name] doesn't answer you can leave the order on her voicemail

## 2023-09-23 NOTE — Telephone Encounter (Signed)
[name] calling again [phone] She is calling again.

## 2023-09-23 NOTE — Telephone Encounter (Signed)
I spoke with [name], I told her Dr. Aundria Rud is out of the office until Monday. I told her we will call back on Monday once he has reviewed the message.

## 2023-09-26 NOTE — Telephone Encounter (Signed)
[name] advised of approved extension of PT. Nothing further needed.

## 2023-10-06 ENCOUNTER — Ambulatory Visit: Payer: Medicare PPO

## 2023-10-07 ENCOUNTER — Telehealth: Payer: Self-pay | Admitting: Student in an Organized Health Care Education/Training Program

## 2023-10-07 NOTE — Telephone Encounter (Signed)
Spoke to [place] with [facility]. She is requesting a verbal for one additional home visit next week.   Dr. Aundria Rud, please advise. Thanks

## 2023-10-07 NOTE — Telephone Encounter (Signed)
[name] would like verbal order for one more home health visit. For next week. [name] phone number is [phone].

## 2023-10-07 NOTE — Telephone Encounter (Signed)
[name] with [name] is aware of below message and voiced her understanding.  Nothing further needed.

## 2023-11-01 ENCOUNTER — Encounter: Payer: Self-pay | Admitting: Urology

## 2023-11-01 ENCOUNTER — Ambulatory Visit: Payer: Medicare PPO | Admitting: Urology

## 2023-11-01 VITALS — BP 132/70

## 2023-11-01 DIAGNOSIS — R300 Dysuria: Secondary | ICD-10-CM

## 2023-11-01 DIAGNOSIS — N390 Urinary tract infection, site not specified: Secondary | ICD-10-CM | POA: Diagnosis not present

## 2023-11-01 DIAGNOSIS — N952 Postmenopausal atrophic vaginitis: Secondary | ICD-10-CM | POA: Diagnosis not present

## 2023-11-01 DIAGNOSIS — M545 Low back pain, unspecified: Secondary | ICD-10-CM | POA: Diagnosis not present

## 2023-11-01 DIAGNOSIS — N8189 Other female genital prolapse: Secondary | ICD-10-CM

## 2023-11-01 DIAGNOSIS — Z87440 Personal history of urinary (tract) infections: Secondary | ICD-10-CM | POA: Diagnosis not present

## 2023-11-01 DIAGNOSIS — R350 Frequency of micturition: Secondary | ICD-10-CM

## 2023-11-01 LAB — MICROSCOPIC EXAMINATION: WBC, UA: >30 /[HPF] — AB (ref 0–5)

## 2023-11-01 LAB — URINALYSIS, COMPLETE
Bilirubin, UA: NEGATIVE
Glucose, UA: NEGATIVE
Ketones, UA: NEGATIVE
Nitrite, UA: POSITIVE — AB
Protein,UA: NEGATIVE
Specific Gravity, UA: 1.015 (ref 1.005–1.030)
Urobilinogen, Ur: 0.2 mg/dL (ref 0.2–1.0)
pH, UA: 5.5 (ref 5.0–7.5)

## 2023-11-01 MED ORDER — CEFUROXIME AXETIL 500 MG PO TABS: 500.0000 mg | ORAL_TABLET | Freq: Two times a day (BID) | ORAL | 0 refills | Status: DC

## 2023-11-01 NOTE — Progress Notes (Signed)
11/01/2023 11:38 AM   [name] 20-May-1941 [id number]  Referring provider: Alease Medina, MD [address] [place],  [place] [address]  Urological history: 1. rUTI's -Contributing factors of age, vaginal atrophy, diabetes, incontinence -Documented urine cultures over the last year  August 30, 2023 no growth  March 03, 2023-Staphylococcus epidermidis  -None -trimethoprim 100 mg daily  2. Mixed incontinence -Contributing factors of age, hysterectomy, vaginal births, diabetes, arthritis, hypertension, vaginal atrophy, pelvic prolapse and lymphedema -Myrbetriq 50 mg daily  3. Nocturia -Risk factors for nocturia: hypertension, diabetes and arthritis  4. Renal cyst -contrast CT (2019) - lower pole of the right kidney there is a 2.1 cm simple cyst.   Chief Complaint  Patient presents with   Follow-up   HPI: [name] is a 82 y.o. female who presents today for severe back pain, frequency and painful urination, asking to be seen today with her daughter, [name].    Previous records reviewed.    She has been having 2 days of lower back pain, and increasing urinary frequency and dysuria.  She is also noted a cloudiness to her urine.  Patient denies any modifying or aggravating factors.  Patient denies any recent UTI's, gross hematuria or suprapubic/flank pain.  Patient denies any fevers, chills, nausea or vomiting.    CATH UA grossly infected  PMH: Past Medical History:  Diagnosis Date   Arthritis    Basal cell carcinoma 10/27/2010   Right lower medial cheek. Excised: 11/24/2010, margins free.   Breast cancer (HCC) 2000   Cancer (HCC)    Diabetes mellitus    GERD (gastroesophageal reflux disease)    Hypertension    Lymph edema    Rt arm   Neuropathy    Osteoporosis    Personal history of chemotherapy 2000   right breast   Personal history of radiation therapy 2000   right breast   Thyroid disease     Surgical History: Past  Surgical History:  Procedure Laterality Date   ANTERIOR AND POSTERIOR REPAIR N/A 10/18/2016   Procedure: ANTERIOR (CYSTOCELE) AND POSTERIOR REPAIR (RECTOCELE);  Surgeon: Hildred Laser, MD;  Location: [facility] ORS;  Service: Gynecology;  Laterality: N/A;   APPENDECTOMY     BACK SURGERY  1983   discectomy   BREAST EXCISIONAL BIOPSY Left 12/11/2010   benign.  BENIGN BREAST TISSUE WITH STROMAL FIBROSIS, SCLEROSING ADENOSIS    cartoid u/s  02/24/06   60-79% LICA stenosis   COLONOSCOPY     DOPPLER ECHOCARDIOGRAPHY  02/24/06   Triv MR, EF 55-60%   FOOT SURGERY  1992 & 1994   bilateral   HEMORROIDECTOMY     MASTECTOMY Right 2000   right, modified radical+ chemo, 2/23 nodes 4/03   miscarriage  1970   mumps  1965   h/o   OTHER SURGICAL HISTORY  04/03/02   dexa, wnl   OTHER SURGICAL HISTORY  03/12/04   dexa, wnl   OTHER SURGICAL HISTORY  08/12/05   dexa-osteopor Hip, osteopen spine   OTHER SURGICAL HISTORY  10/25/07   dexa- nml hip and spine   SEPTOPLASTY  1970   TONSILLECTOMY  1945   VAGINAL DELIVERY     x5   VAGINAL HYSTERECTOMY  1974   partial, fibroids   VENTRAL HERNIA REPAIR      Home Medications:  Allergies as of 11/01/2023       Reactions   Atorvastatin Swelling   Leg swelling Other reaction(s): Unknown Leg swelling Leg swelling  Azithromycin Diarrhea, Nausea And Vomiting   Other reaction(s): Nausea And Vomiting   Ciprofloxacin Other (See Comments)   Does not remember Other reaction(s): Unknown tendonitis tendonitis   Doxycycline Nausea And Vomiting   Hydrocodone Bit-homatrop Mbr    REACTION:    Hydrocodone Bit-homatrop Mbr    Other reaction(s): Other (See Comments) Drowsiness Other reaction(s): Other (See Comments) REACTION:   Rosiglitazone Swelling   Other reaction(s): Unknown REACTION: SWELLING   Rosiglitazone Maleate    REACTION: SWELLING        Medication List        Accurate as of November 01, 2023 11:38 AM. If you have any questions, ask your  nurse or doctor.          Accu-Chek Aviva Plus test strip Generic drug: glucose blood USE 1 STRIP TWICE A DAY TO CHECK BLOOD SUGAR   Accu-Chek Aviva Plus w/Device Kit Use to check blood sugar twice daily   accu-chek multiclix lancets Use as instructed to check blood sugar twice daily E11.9   acetaminophen 650 MG CR tablet Commonly known as: Tylenol 8 Hour Take 1 tablet (650 mg total) by mouth every 8 (eight) hours as needed for pain.   aspirin 81 MG tablet Take 81 mg by mouth at bedtime.   BD Pen Needle Nano U/F 32G X 4 MM Misc Generic drug: Insulin Pen Needle USE 1 PEN NEEDLE DAILY   carvedilol 6.25 MG tablet Commonly known as: COREG Take 6.25 mg by mouth 2 (two) times daily.   cefUROXime 500 MG tablet Commonly known as: CEFTIN Take 1 tablet (500 mg total) by mouth 2 (two) times daily with a meal. Started by: Darlene Brozowski   CENTRUM SILVER PO Take 1 tablet by mouth daily.   clobetasol cream 0.05 % Commonly known as: TEMOVATE APPLY ONE APPLICATION ON THE SKIN TWICE A DAY AS NEEDED. AVOID FACE, GROIN, AND UNDERARMS.   co-enzyme Q-10 30 MG capsule Take 200 mg by mouth daily.   cyanocobalamin 1000 MCG tablet Commonly known as: VITAMIN B12 Take 1,000 mcg by mouth daily.   docusate sodium 100 MG capsule Commonly known as: COLACE Take 1 capsule (100 mg total) by mouth 2 (two) times daily as needed for mild constipation.   ezetimibe 10 MG tablet Commonly known as: ZETIA TAKE ONE (1) TABLET EACH DAY   furosemide 20 MG tablet Commonly known as: LASIX Take 1 tablet (20 mg total) by mouth daily.   insulin glargine 100 UNIT/ML Solostar Pen Commonly known as: Lantus SoloStar INJECT 26 UNITS INTO THE SKIN AT BEDTIME.   levothyroxine 75 MCG tablet Commonly known as: SYNTHROID Take 75 mcg by mouth daily before breakfast.   meclizine 25 MG tablet Commonly known as: ANTIVERT Take 25 mg by mouth 3 (three) times daily as needed.   Myrbetriq 50 MG Tb24  tablet Generic drug: mirabegron ER TAKE ONE TABLET BY MOUTH EVERY DAY   naproxen sodium 220 MG tablet Commonly known as: ALEVE Take 440 mg by mouth daily as needed.   omeprazole 20 MG capsule Commonly known as: PRILOSEC TAKE ONE (1) CAPSULE EACH DAY   OVER THE COUNTER MEDICATION Take 1 tablet by mouth daily. Immune System Support Supplement includes :  Echinacea Zinc Vitamin C   POTASSIUM PO Take 1 tablet by mouth at bedtime.   sertraline 25 MG tablet Commonly known as: ZOLOFT Take 25 mg by mouth daily.   trimethoprim 100 MG tablet Commonly known as: TRIMPEX TAKE 1 TABLET BY MOUTH DAILY  valsartan 160 MG tablet Commonly known as: DIOVAN Take 160 mg by mouth daily.   VITAMIN D (CHOLECALCIFEROL) PO Take by mouth.        Allergies:  Allergies  Allergen Reactions   Atorvastatin Swelling    Leg swelling Other reaction(s): Unknown Leg swelling Leg swelling   Azithromycin Diarrhea and Nausea And Vomiting    Other reaction(s): Nausea And Vomiting   Ciprofloxacin Other (See Comments)    Does not remember Other reaction(s): Unknown tendonitis tendonitis   Doxycycline Nausea And Vomiting   Hydrocodone Bit-Homatrop Mbr     REACTION:    Hydrocodone Bit-Homatrop Mbr     Other reaction(s): Other (See Comments) Drowsiness Other reaction(s): Other (See Comments) REACTION:   Rosiglitazone Swelling    Other reaction(s): Unknown REACTION: SWELLING   Rosiglitazone Maleate     REACTION: SWELLING    Family History: Family History  Problem Relation Age of Onset   Diabetes Mother    Hypertension Mother    Heart attack Mother    Heart failure Father    Hypertension Father    Stroke Father    Lung cancer Brother        hx of smoking   Stroke Brother    Heart attack Brother        PTCA MI x 2   Breast cancer Paternal Aunt        paternal great aunt    Social History:  reports that she has never smoked. She has never used smokeless tobacco. She reports that  she does not drink alcohol and does not use drugs.  ROS: Pertinent ROS in HPI  Physical Exam: BP 132/70   Constitutional:  Well nourished. Alert and oriented, No acute distress. HEENT: [place] AT, moist mucus membranes.  Trachea midline Cardiovascular: No clubbing, cyanosis, or edema. Respiratory: Normal respiratory effort, no increased work of breathing. GU: No CVA tenderness.  No bladder fullness or masses.  Recession of labia minora, dry, pale vulvar vaginal mucosa and loss of mucosal ridges and folds.  Normal urethral meatus, no lesions, no prolapse, no discharge.   No urethral masses, tenderness and/or tenderness. No bladder fullness, tenderness or masses. Pale vagina mucosa, poor estrogen effect, no discharge, no lesions, poor pelvic support, grade III cystocele and no rectocele noted.  Anus and perineum are without rashes or lesions.     Neurologic: Grossly intact, no focal deficits, moving all 4 extremities. Psychiatric: Normal mood and affect.    Laboratory Data: Urinalysis See Epic and HPI I have reviewed the labs.   Pertinent Imaging: N/A  In and Out Catheterization  Patient is present today for a I & O catheterization due to rUTI's. Patient was cleaned and prepped in a sterile fashion with betadine . A 14 FR cath was inserted no complications were noted , 250 ml of urine return was noted, urine was yellow in color. A clean urine sample was collected for urinalysis and urine culture. Bladder was drained  And catheter was removed with out difficulty.    Performed by: Michiel Cowboy, PA-C and Humberta Magallon - Mariche, CMA    1. rUTI's -UA grossly infected  -Urine culture pending -Started empirically on Ceftin, will adjust if necessary once urine culture and sensitivity results are available    2. GSM -continue vaginal lubricants with hyaluronic acid for symptom relief  3. Pelvic floor laxity -She does not empty her bladder completely and she has severe prolapse   -Manage conservatively  Return if symptoms worsen or fail  to improve.  These notes generated with voice recognition software. I apologize for typographical errors.  Cloretta Ned  [facility] [address]  [address] [place], [place] [address] [phone]

## 2023-11-03 ENCOUNTER — Ambulatory Visit: Payer: Medicare PPO | Attending: Student in an Organized Health Care Education/Training Program

## 2023-11-04 LAB — CULTURE, URINE COMPREHENSIVE

## 2023-12-27 ENCOUNTER — Other Ambulatory Visit: Payer: Self-pay

## 2023-12-27 ENCOUNTER — Telehealth: Payer: Self-pay | Admitting: Family Medicine

## 2023-12-27 MED ORDER — SERTRALINE HCL 25 MG PO TABS: 25.0000 mg | ORAL_TABLET | Freq: Every day | ORAL | 3 refills | Status: DC

## 2023-12-27 MED ORDER — VALSARTAN 160 MG PO TABS: 160.0000 mg | ORAL_TABLET | Freq: Every day | ORAL | 3 refills | Status: DC

## 2023-12-27 MED ORDER — EZETIMIBE 10 MG PO TABS: 10.0000 mg | ORAL_TABLET | Freq: Every day | ORAL | 6 refills | Status: DC

## 2023-12-27 NOTE — Telephone Encounter (Signed)
 PT needs Zetia, Zoloft, and Diovan refilled to [facility]

## 2024-01-03 ENCOUNTER — Ambulatory Visit (INDEPENDENT_AMBULATORY_CARE_PROVIDER_SITE_OTHER): Payer: Medicare PPO | Admitting: Family Medicine

## 2024-01-03 ENCOUNTER — Encounter: Payer: Self-pay | Admitting: Family Medicine

## 2024-01-03 VITALS — BP 128/76 | HR 76 | Temp 97.4°F | Resp 16 | Ht 65.0 in | Wt 182.4 lb

## 2024-01-03 DIAGNOSIS — M810 Age-related osteoporosis without current pathological fracture: Secondary | ICD-10-CM | POA: Insufficient documentation

## 2024-01-03 DIAGNOSIS — E785 Hyperlipidemia, unspecified: Secondary | ICD-10-CM

## 2024-01-03 DIAGNOSIS — Z23 Encounter for immunization: Secondary | ICD-10-CM

## 2024-01-03 DIAGNOSIS — Z780 Asymptomatic menopausal state: Secondary | ICD-10-CM

## 2024-01-03 DIAGNOSIS — E118 Type 2 diabetes mellitus with unspecified complications: Secondary | ICD-10-CM | POA: Insufficient documentation

## 2024-01-03 DIAGNOSIS — Z1231 Encounter for screening mammogram for malignant neoplasm of breast: Secondary | ICD-10-CM

## 2024-01-03 DIAGNOSIS — Z794 Long term (current) use of insulin: Secondary | ICD-10-CM | POA: Diagnosis not present

## 2024-01-03 DIAGNOSIS — N760 Acute vaginitis: Secondary | ICD-10-CM

## 2024-01-03 DIAGNOSIS — E871 Hypo-osmolality and hyponatremia: Secondary | ICD-10-CM | POA: Diagnosis not present

## 2024-01-03 DIAGNOSIS — I10 Essential (primary) hypertension: Secondary | ICD-10-CM

## 2024-01-03 DIAGNOSIS — N952 Postmenopausal atrophic vaginitis: Secondary | ICD-10-CM

## 2024-01-03 DIAGNOSIS — I6521 Occlusion and stenosis of right carotid artery: Secondary | ICD-10-CM

## 2024-01-03 MED ORDER — FLUCONAZOLE 150 MG PO TABS
150.0000 mg | ORAL_TABLET | Freq: Every day | ORAL | 0 refills | Status: AC
Start: 2024-01-03 — End: 2024-01-04

## 2024-01-03 NOTE — Progress Notes (Signed)
Established Patient Office Visit  Subjective   Patient ID: [name], female    DOB: 03/12/1941  Age: 83 y.o. MRN: [id number]  Chief Complaint  Patient presents with   Establish Care   Medical Management of Chronic Issues    HPI Giabella is doing pretty well.  She has not had a bone density scan in about 10 years she agrees to set 1 of those up.  She also asked for a mammogram she likes to go to the [facility] in [place]. She has not seen Dr. Debroah Loop, vascular surgeon in a couple of years and she has R ICA 60 to 79%. Diabetes is running really well.  Fasting blood sugar this morning was 116.  She is on 21 units of Lantus daily. She has lymphedema in her right arm from her breast cancer.  She is not wearing her sleeve today.  She has an area of hyperemia that is lattice like.  Saw dermatologist who said it is not infected but offered no treatment.  Family would like to see another dermatologist. Hypertension is well-controlled today blood pressure is 128/76 she is on Lasix every other day, carvedilol 6.25 twice daily potassium chloride 10 mEq daily valsartan 160 mg daily. She has a B12 deficiency but takes B12 orally.  She has issues with frequent bladder infections and she is currently on trimethoprim 100 mg daily and Myrbetriq 50 mg daily.  She has not had a bladder infection since starting the trimethoprim.    ROS    Objective:     BP 128/76 (BP Location: Left Arm, Patient Position: Sitting, Cuff Size: Large)   Pulse 76   Temp (!) 97.4 F (36.3 C) (Oral)   Resp 16   Ht 5\' 5"  (1.651 m) Comment: per chart  Wt 182 lb 6.4 oz (82.7 kg)   SpO2 95%   BMI 30.35 kg/m    Physical Exam Vitals and nursing note reviewed.  Constitutional:      Appearance: Normal appearance.  HENT:     Head: Normocephalic and atraumatic.  Eyes:     Conjunctiva/sclera: Conjunctivae normal.  Cardiovascular:     Rate and Rhythm: Normal rate and regular rhythm.  Pulmonary:     Effort:  Pulmonary effort is normal.     Breath sounds: Normal breath sounds.  Musculoskeletal:     Right lower leg: No edema.     Left lower leg: No edema.  Skin:    General: Skin is warm and dry.  Neurological:     Mental Status: She is alert and oriented to person, place, and time.  Psychiatric:        Mood and Affect: Mood normal.        Behavior: Behavior normal.        Thought Content: Thought content normal.        Judgment: Judgment normal.          No results found for any visits on 01/03/24.    The ASCVD Risk score (Arnett DK, et al., 2019) failed to calculate for the following reasons:   The 2019 ASCVD risk score is only valid for ages 54 to 49    Assessment & Plan:  Controlled type 2 diabetes mellitus with complication, with long-term current use of insulin (HCC) Assessment & Plan: She is on Lantus 21 units daily fasting blood sugars are very well-controlled.  Checking CMP and A1c.  Checking microalbumin creatinine ratio.  Orders: -     Hemoglobin A1c -  HM Diabetes Foot Exam -     Ambulatory referral to Ophthalmology -     Microalbumin / creatinine urine ratio -     CBC with Differential/Platelet -     CMP14+EGFR -     Lipid panel  Essential hypertension Assessment & Plan: Pressure is well-controlled today on carvedilol 6.25 mg twice daily Lasix 20 mg q. OD valsartan 160 mg daily   Hyponatremia Assessment & Plan: Has mild hyponatremia secondary to valsartan we will check her sodium level   Hyperlipidemia, unspecified hyperlipidemia type Assessment & Plan: Goal is LDL 70 or less.  Checking her lipids today she is not taking a statin we will discuss Zetia depending on her lab values.   Age-related osteoporosis without current pathological fracture Assessment & Plan: Has not had a bone mineral density in a number of years.  Will check this  Orders: -     DG Bone Density; Future -     Ambulatory referral to Sports Medicine  Encounter for screening  mammogram for malignant neoplasm of breast -     3D Screening Mammogram, Left and Right; Future  Encounter for osteoporosis screening in asymptomatic postmenopausal patient  Need for immunization against influenza -     Flu Vaccine Trivalent High Dose (Fluad)  Stenosis of right carotid artery Assessment & Plan: Has not seen the vascular surgeon about occlusion of her right ICA.  Referral out to vascular surgery  Orders: -     Ambulatory referral to Vascular Surgery  Vulvovaginitis Assessment & Plan: Reports she has a yeast infection treat with Diflucan 150 1 p.o.  Orders: -     Fluconazole; Take 1 tablet (150 mg total) by mouth daily for 1 day.  Dispense: 1 tablet; Refill: 0  Vaginal atrophy Assessment & Plan: Diflucan 150mg  1 po       No follow-ups on file.    Alease Medina, MD

## 2024-01-03 NOTE — Assessment & Plan Note (Signed)
Pressure is well-controlled today on carvedilol 6.25 mg twice daily Lasix 20 mg q. OD valsartan 160 mg daily

## 2024-01-03 NOTE — Assessment & Plan Note (Signed)
Goal is LDL 70 or less.  Checking her lipids today she is not taking a statin we will discuss Zetia depending on her lab values.

## 2024-01-03 NOTE — Assessment & Plan Note (Signed)
Reports she has a yeast infection treat with Diflucan 150 1 p.o.

## 2024-01-03 NOTE — Assessment & Plan Note (Signed)
Has mild hyponatremia secondary to valsartan we will check her sodium level

## 2024-01-03 NOTE — Assessment & Plan Note (Signed)
Has not seen the vascular surgeon about occlusion of her right ICA.  Referral out to vascular surgery

## 2024-01-03 NOTE — Assessment & Plan Note (Signed)
She is on Lantus 21 units daily fasting blood sugars are very well-controlled.  Checking CMP and A1c.  Checking microalbumin creatinine ratio.

## 2024-01-03 NOTE — Assessment & Plan Note (Signed)
Has not had a bone mineral density in a number of years.  Will check this

## 2024-01-03 NOTE — Assessment & Plan Note (Signed)
Diflucan 150 mg 1 p.o.

## 2024-01-05 ENCOUNTER — Other Ambulatory Visit: Payer: Self-pay | Admitting: Family Medicine

## 2024-01-05 ENCOUNTER — Telehealth: Payer: Self-pay | Admitting: Family Medicine

## 2024-01-05 ENCOUNTER — Encounter: Payer: Self-pay | Admitting: Family Medicine

## 2024-01-05 ENCOUNTER — Other Ambulatory Visit: Payer: Self-pay

## 2024-01-05 ENCOUNTER — Other Ambulatory Visit
Admission: RE | Admit: 2024-01-05 | Discharge: 2024-01-05 | Disposition: A | Discharge status: Home / Self Care | Payer: Medicare PPO | Source: Ambulatory Visit | Attending: Family Medicine | Admitting: Family Medicine

## 2024-01-05 ENCOUNTER — Telehealth: Payer: Self-pay

## 2024-01-05 DIAGNOSIS — E785 Hyperlipidemia, unspecified: Secondary | ICD-10-CM

## 2024-01-05 DIAGNOSIS — I10 Essential (primary) hypertension: Secondary | ICD-10-CM | POA: Insufficient documentation

## 2024-01-05 DIAGNOSIS — E118 Type 2 diabetes mellitus with unspecified complications: Secondary | ICD-10-CM | POA: Diagnosis not present

## 2024-01-05 DIAGNOSIS — Z794 Long term (current) use of insulin: Secondary | ICD-10-CM | POA: Diagnosis not present

## 2024-01-05 DIAGNOSIS — N760 Acute vaginitis: Secondary | ICD-10-CM

## 2024-01-05 LAB — CBC WITH DIFFERENTIAL/PLATELET
Abs Immature Granulocytes: 0.06 10*3/uL (ref 0.00–0.07)
Basophils Absolute: 0 10*3/uL (ref 0.0–0.1)
Basophils Relative: 0 %
Eosinophils Absolute: 0.1 10*3/uL (ref 0.0–0.5)
Eosinophils Relative: 1 %
HCT: 38.7 % (ref 36.0–46.0)
Hemoglobin: 13 g/dL (ref 12.0–15.0)
Immature Granulocytes: 1 %
Lymphocytes Relative: 12 %
Lymphs Abs: 1.2 10*3/uL (ref 0.7–4.0)
MCH: 30.4 pg (ref 26.0–34.0)
MCHC: 33.6 g/dL (ref 30.0–36.0)
MCV: 90.6 fL (ref 80.0–100.0)
Monocytes Absolute: 0.9 10*3/uL (ref 0.1–1.0)
Monocytes Relative: 10 %
Neutro Abs: 7.4 10*3/uL (ref 1.7–7.7)
Neutrophils Relative %: 76 %
Platelets: 361 10*3/uL (ref 150–400)
RBC: 4.27 MIL/uL (ref 3.87–5.11)
RDW: 12.8 % (ref 11.5–15.5)
WBC: 9.7 10*3/uL (ref 4.0–10.5)
nRBC: 0 % (ref 0.0–0.2)

## 2024-01-05 LAB — COMPREHENSIVE METABOLIC PANEL
ALT: 15 U/L (ref 0–44)
AST: 20 U/L (ref 15–41)
Albumin: 3.7 g/dL (ref 3.5–5.0)
Alkaline Phosphatase: 45 U/L (ref 38–126)
Anion gap: 10 (ref 5–15)
BUN: 27 mg/dL — ABNORMAL HIGH (ref 8–23)
CO2: 24 mmol/L (ref 22–32)
Calcium: 9.2 mg/dL (ref 8.9–10.3)
Chloride: 97 mmol/L — ABNORMAL LOW (ref 98–111)
Creatinine, Ser: 0.74 mg/dL (ref 0.44–1.00)
GFR, Estimated: >60 mL/min (ref >60)
Glucose, Bld: 157 mg/dL — ABNORMAL HIGH (ref 70–99)
Potassium: 4.8 mmol/L (ref 3.5–5.1)
Sodium: 131 mmol/L — ABNORMAL LOW (ref 135–145)
Total Bilirubin: 0.7 mg/dL (ref 0.0–1.2)
Total Protein: 6.9 g/dL (ref 6.5–8.1)

## 2024-01-05 LAB — LIPID PANEL
Cholesterol: 182 mg/dL (ref 0–200)
HDL: 42 mg/dL (ref >40)
LDL Cholesterol: 121 mg/dL — ABNORMAL HIGH (ref 0–99)
Total CHOL/HDL Ratio: 4.3 {ratio}
Triglycerides: 94 mg/dL (ref <150)
VLDL: 19 mg/dL (ref 0–40)

## 2024-01-05 LAB — HEMOGLOBIN A1C
Hgb A1c MFr Bld: 7.5 % — ABNORMAL HIGH (ref 4.8–5.6)
Mean Plasma Glucose: 168.55 mg/dL

## 2024-01-05 NOTE — Progress Notes (Unsigned)
01/06/2024 8:38 AM   [name] 1941/09/13 [id number]  Referring provider: Alease Medina, MD [address] [place],  [place] [address]  Urological history: 1. rUTI's -Contributing factors of age, vaginal atrophy, diabetes, incontinence -Documented urine cultures over the last year  August 30, 2023 no growth  March 03, 2023-Staphylococcus epidermidis  -None -trimethoprim 100 mg daily  2. Mixed incontinence -Contributing factors of age, hysterectomy, vaginal births, diabetes, arthritis, hypertension, vaginal atrophy, pelvic prolapse and lymphedema -failed pessary  -Myrbetriq 50 mg daily  3. Nocturia -Risk factors for nocturia: hypertension, diabetes and arthritis  4. Renal cyst -contrast CT (2019) - lower pole of the right kidney there is a 2.1 cm simple cyst.   Chief Complaint  Patient presents with   Follow-up   HPI: [name] is a 83 y.o. female who presents today for severe back pain, frequency and painful urination, asking to be seen today with her daughter, [name].    Previous records reviewed.    She was seen by Dr. Girtha [name] on 01/02/2023 to establish care and told her she had a vaginal yeast infection and was prescribed Diflucan.  She then called back on 01/04/2023 and stated she had urinated twice and after she wiped she notices pink stains on the wipes as well as vaginal itching.  She was instructed to follow up with urology.    She states that she has been having some vaginal burning.  She does not use the vaginal lubricants daily.  She thought it was a yeast infection at first, but then she became concerned when she saw blood on her toilet paper.  She has developed some nausea today.  The burning has been keeping her up at night and she started AZO over the weekend which is providing relief.  Patient denies any modifying or aggravating factors.  Patient denies any recent UTI's, gross hematuria or suprapubic/flank pain.  Patient denies any fevers,  chills, nausea or vomiting.    She has not been using the vaginal lubricants on a consistent basis, just mostly when she is uncomfortable in the perineal area.  CATH UA orange cloudy, trace glucose, specific gravity 1.010, 1+ blood, pH 5.0, 1+ protein, nitrate positive (secondary to AZO use), greater than 30 WBCs, 3-10 RBCs, 0-2 epithelial cells mucus threads present and moderate bacteria.   PMH: Past Medical History:  Diagnosis Date   Arthritis    Basal cell carcinoma 10/27/2010   Right lower medial cheek. Excised: 11/24/2010, margins free.   Breast cancer (HCC) 2000   Cancer (HCC)    Diabetes mellitus    GERD (gastroesophageal reflux disease)    Hypertension    Lymph edema    Rt arm   Neuropathy    Osteoporosis    Personal history of chemotherapy 2000   right breast   Personal history of radiation therapy 2000   right breast   Thyroid disease     Surgical History: Past Surgical History:  Procedure Laterality Date   ANTERIOR AND POSTERIOR REPAIR N/A 10/18/2016   Procedure: ANTERIOR (CYSTOCELE) AND POSTERIOR REPAIR (RECTOCELE);  Surgeon: Hildred Laser, MD;  Location: [facility] ORS;  Service: Gynecology;  Laterality: N/A;   APPENDECTOMY     BACK SURGERY  1983   discectomy   BREAST EXCISIONAL BIOPSY Left 12/11/2010   benign.  BENIGN BREAST TISSUE WITH STROMAL FIBROSIS, SCLEROSING ADENOSIS    cartoid u/s  02/24/06   60-79% LICA stenosis   COLONOSCOPY     DOPPLER ECHOCARDIOGRAPHY  02/24/06  Triv MR, EF 55-60%   FOOT SURGERY  1992 & 1994   bilateral   HEMORROIDECTOMY     MASTECTOMY Right 2000   right, modified radical+ chemo, 2/23 nodes 4/03   miscarriage  1970   mumps  1965   h/o   OTHER SURGICAL HISTORY  04/03/02   dexa, wnl   OTHER SURGICAL HISTORY  03/12/04   dexa, wnl   OTHER SURGICAL HISTORY  08/12/05   dexa-osteopor Hip, osteopen spine   OTHER SURGICAL HISTORY  10/25/07   dexa- nml hip and spine   SEPTOPLASTY  1970   TONSILLECTOMY  1945   VAGINAL DELIVERY     x5    VAGINAL HYSTERECTOMY  1974   partial, fibroids   VENTRAL HERNIA REPAIR      Home Medications:  Allergies as of 01/06/2024       Reactions   Atorvastatin Swelling   Leg swelling Other reaction(s): Unknown Leg swelling Leg swelling   Azithromycin Diarrhea, Nausea And Vomiting   Other reaction(s): Nausea And Vomiting   Ciprofloxacin Other (See Comments)   Does not remember Other reaction(s): Unknown tendonitis tendonitis   Doxycycline Nausea And Vomiting   Hydrocodone Bit-homatrop Mbr    REACTION:    Hydrocodone Bit-homatrop Mbr    Other reaction(s): Other (See Comments) Drowsiness Other reaction(s): Other (See Comments) REACTION:   Rosiglitazone Swelling   Other reaction(s): Unknown REACTION: SWELLING   Rosiglitazone Maleate    REACTION: SWELLING        Medication List        Accurate as of January 06, 2024 11:59 PM. If you have any questions, ask your nurse or doctor.          Accu-Chek Aviva Plus test strip Generic drug: glucose blood USE 1 STRIP TWICE A DAY TO CHECK BLOOD SUGAR   Accu-Chek Aviva Plus w/Device Kit Use to check blood sugar twice daily   accu-chek multiclix lancets Use as instructed to check blood sugar twice daily E11.9   acetaminophen 650 MG CR tablet Commonly known as: Tylenol 8 Hour Take 1 tablet (650 mg total) by mouth every 8 (eight) hours as needed for pain.   ALIGN PO Take by mouth.   aspirin 81 MG tablet Take 81 mg by mouth at bedtime.   BD Pen Needle Nano U/F 32G X 4 MM Misc Generic drug: Insulin Pen Needle USE 1 PEN NEEDLE DAILY   BEET ROOT PO Take by mouth.   carvedilol 6.25 MG tablet Commonly known as: COREG Take 6.25 mg by mouth 2 (two) times daily.   CENTRUM SILVER PO Take 1 tablet by mouth daily.   chlorhexidine 4 % external liquid Commonly known as: Hibiclens Apply topically daily as needed. Started by: Michiel Cowboy   clobetasol cream 0.05 % Commonly known as: TEMOVATE APPLY ONE APPLICATION  ON THE SKIN TWICE A DAY AS NEEDED. AVOID FACE, GROIN, AND UNDERARMS.   co-enzyme Q-10 30 MG capsule Take 200 mg by mouth daily.   cyanocobalamin 1000 MCG tablet Commonly known as: VITAMIN B12 Take 1,000 mcg by mouth daily.   D3 5000 PO Take by mouth.   ezetimibe 10 MG tablet Commonly known as: ZETIA Take 1 tablet (10 mg total) by mouth daily.   fluconazole 150 MG tablet Commonly known as: DIFLUCAN Take 1 tablet (150 mg total) by mouth once for 1 dose.   furosemide 20 MG tablet Commonly known as: LASIX Take 1 tablet (20 mg total) by mouth daily.   insulin glargine  100 UNIT/ML Solostar Pen Commonly known as: Lantus SoloStar INJECT 26 UNITS INTO THE SKIN AT BEDTIME.   levothyroxine 75 MCG tablet Commonly known as: SYNTHROID Take 75 mcg by mouth daily before breakfast.   Magnesium 250 MG Caps Take by mouth.   Myrbetriq 50 MG Tb24 tablet Generic drug: mirabegron ER TAKE ONE TABLET BY MOUTH EVERY DAY   nitrofurantoin (macrocrystal-monohydrate) 100 MG capsule Commonly known as: MACROBID Take 1 capsule (100 mg total) by mouth every 12 (twelve) hours. Started by: Michiel Cowboy   OVER THE COUNTER MEDICATION Take 1 tablet by mouth daily. Immune System Support Supplement includes :  Echinacea Zinc Vitamin C   potassium chloride 10 MEQ tablet Commonly known as: KLOR-CON Take 10 mEq by mouth daily.   QC TUMERIC COMPLEX PO Take by mouth.   sertraline 25 MG tablet Commonly known as: ZOLOFT Take 1 tablet (25 mg total) by mouth daily.   SUPER CRANBERRY/VITAMIN D3 PO Take by mouth.   trimethoprim 100 MG tablet Commonly known as: TRIMPEX TAKE 1 TABLET BY MOUTH DAILY   valsartan 160 MG tablet Commonly known as: DIOVAN Take 1 tablet (160 mg total) by mouth daily.        Allergies:  Allergies  Allergen Reactions   Atorvastatin Swelling    Leg swelling Other reaction(s): Unknown Leg swelling Leg swelling   Azithromycin Diarrhea and Nausea And Vomiting     Other reaction(s): Nausea And Vomiting   Ciprofloxacin Other (See Comments)    Does not remember Other reaction(s): Unknown tendonitis tendonitis   Doxycycline Nausea And Vomiting   Hydrocodone Bit-Homatrop Mbr     REACTION:    Hydrocodone Bit-Homatrop Mbr     Other reaction(s): Other (See Comments) Drowsiness Other reaction(s): Other (See Comments) REACTION:   Rosiglitazone Swelling    Other reaction(s): Unknown REACTION: SWELLING   Rosiglitazone Maleate     REACTION: SWELLING    Family History: Family History  Problem Relation Age of Onset   Diabetes Mother    Hypertension Mother    Heart attack Mother    Heart failure Father    Hypertension Father    Stroke Father    Lung cancer Brother        hx of smoking   Stroke Brother    Heart attack Brother        PTCA MI x 2   Breast cancer Paternal Aunt        paternal great aunt    Social History:  reports that she has never smoked. She has never used smokeless tobacco. She reports that she does not drink alcohol and does not use drugs.  ROS: Pertinent ROS in HPI  Physical Exam: BP (!) 152/70   Constitutional:  Well nourished. Alert and oriented, No acute distress. HEENT: [place] AT, moist mucus membranes.  Trachea midline, no masses. Cardiovascular: No clubbing, cyanosis, or edema. Respiratory: Normal respiratory effort, no increased work of breathing. GU: No CVA tenderness.  No bladder fullness or masses.  Recession of labia minora, dry, pale vulvar vaginal mucosa and loss of mucosal ridges and folds.  One angiokeratomas were noted on her labia bilaterally.  One of the angiokeratomas appeared to have scabbed over.  Normal urethral meatus, no lesions, no prolapse, no discharge.   No urethral masses, tenderness and/or tenderness. No bladder fullness, tenderness or masses. Pale vagina mucosa, poor estrogen effect, no discharge, no lesions, poor pelvic support, grade III cystocele and no rectocele noted.   Anus and perineum are  without  rashes or lesions.    Neurologic: Grossly intact, no focal deficits, moving all 4 extremities. Psychiatric: Normal mood and affect.    Laboratory Data: Urinalysis See Epic and HPI I have reviewed the labs.   Pertinent Imaging: N/A  In and Out Catheterization  Patient is present today for a I & O catheterization due to rUTI's. Patient was cleaned and prepped in a sterile fashion with betadine . A 14 FR cath was inserted no complications were noted , 200 ml of urine return was noted, urine was orange in color. A clean urine sample was collected for urinalysis and urine culture. Bladder was drained  And catheter was removed with out difficulty.    Performed by: Michiel Cowboy, PA-C and Humberta Magallon - Mariche, CMA    1. rUTI's -UA grossly infected  -Urine culture pending -Started empirically on Macrobid, will adjust if necessary once urine culture and sensitivity results are available   -She will discontinue the trimethoprim 100 mg daily -I will also have her start cleaned her perineal area with Hibiclens  2. GSM -continue vaginal lubricants with hyaluronic acid for symptom relief -advised to use the vaginal lubricants daily and liberally  3. Pelvic floor laxity -She does not empty her bladder completely and she has severe prolapse  -Manage conservatively  4. Microscopic hematuria -UA w/ micro heme -urine culture pending -If urine culture is negative, we will need to proceed with a hematuria workup -If urine culture is positive, we will treat with appropriate antibiotic and then have her return in 2 months for symptom recheck and repeat UA   Return for pending urine culture results .  These notes generated with voice recognition software. I apologize for typographical errors.  Cloretta Ned  [facility] [address]  [address] [place], [place] [address] [phone]

## 2024-01-05 NOTE — Telephone Encounter (Signed)
Patient called in to see if she could take her mother to [facility] for lab draw as she did not feel comfortable taking her to a [facility] site. Orders were changed after approval from Dr. Girtha Rm.

## 2024-01-05 NOTE — Telephone Encounter (Signed)
Patient states that she has urinated twice and after she wipes she notices pink stains on the wipes as well as vaginal itching.   States she has used the Diflucan that was prescribed along with Vagasil extra strength but has only experienced temporary relief.   I advised patient to follow up with her Urologist for further examination and testing.

## 2024-01-05 NOTE — Telephone Encounter (Unsigned)
Copied from CRM [id number]. Topic: Clinical - Medical Advice >> Jan 05, 2024  2:23 PM Yolanda T wrote: Reason for CRM: patients daughter [place] called sttd patient took the 1 dosage of diflucan and she is still having itchy burning feeling and she is also bleeding. Please f/u with daughter

## 2024-01-06 ENCOUNTER — Encounter: Payer: Self-pay | Admitting: Family Medicine

## 2024-01-06 ENCOUNTER — Ambulatory Visit: Payer: Medicare PPO | Admitting: Urology

## 2024-01-06 VITALS — BP 152/70

## 2024-01-06 DIAGNOSIS — N952 Postmenopausal atrophic vaginitis: Secondary | ICD-10-CM

## 2024-01-06 DIAGNOSIS — N390 Urinary tract infection, site not specified: Secondary | ICD-10-CM

## 2024-01-06 DIAGNOSIS — Z87440 Personal history of urinary (tract) infections: Secondary | ICD-10-CM

## 2024-01-06 DIAGNOSIS — R3129 Other microscopic hematuria: Secondary | ICD-10-CM

## 2024-01-06 DIAGNOSIS — N8189 Other female genital prolapse: Secondary | ICD-10-CM

## 2024-01-06 DIAGNOSIS — R82998 Other abnormal findings in urine: Secondary | ICD-10-CM | POA: Diagnosis not present

## 2024-01-06 LAB — URINALYSIS, COMPLETE
Bilirubin, UA: NEGATIVE
Ketones, UA: NEGATIVE
Nitrite, UA: POSITIVE — AB
Specific Gravity, UA: 1.01 (ref 1.005–1.030)
Urobilinogen, Ur: 1 mg/dL (ref 0.2–1.0)
pH, UA: 5 (ref 5.0–7.5)

## 2024-01-06 LAB — MICROSCOPIC EXAMINATION: WBC, UA: >30 /[HPF] — AB (ref 0–5)

## 2024-01-06 MED ORDER — NITROFURANTOIN MONOHYD MACRO 100 MG PO CAPS: 100.0000 mg | ORAL_CAPSULE | Freq: Two times a day (BID) | ORAL | 0 refills | Status: DC

## 2024-01-06 MED ORDER — FLUCONAZOLE 150 MG PO TABS: 150.0000 mg | ORAL_TABLET | Freq: Once | ORAL | 0 refills | Status: AC

## 2024-01-06 MED ORDER — CHLORHEXIDINE GLUCONATE 4 % EX SOLN: Freq: Every day | CUTANEOUS | 3 refills | Status: AC | PRN

## 2024-01-09 ENCOUNTER — Encounter: Payer: Self-pay | Admitting: Urology

## 2024-01-10 LAB — CULTURE, URINE COMPREHENSIVE

## 2024-01-11 ENCOUNTER — Other Ambulatory Visit: Payer: Self-pay | Admitting: Urology

## 2024-01-11 DIAGNOSIS — N390 Urinary tract infection, site not specified: Secondary | ICD-10-CM

## 2024-01-11 MED ORDER — AMOXICILLIN-POT CLAVULANATE 875-125 MG PO TABS: 1.0000 | ORAL_TABLET | Freq: Two times a day (BID) | ORAL | 0 refills | Status: DC

## 2024-01-16 ENCOUNTER — Other Ambulatory Visit: Payer: Self-pay | Admitting: Family Medicine

## 2024-01-16 DIAGNOSIS — N760 Acute vaginitis: Secondary | ICD-10-CM

## 2024-01-16 MED ORDER — FLUCONAZOLE 150 MG PO TABS: 150.0000 mg | ORAL_TABLET | Freq: Once | ORAL | 0 refills | Status: AC

## 2024-01-25 ENCOUNTER — Telehealth: Payer: Self-pay

## 2024-01-25 NOTE — Telephone Encounter (Signed)
Spoke with [facility] and they confirmed that refill was received and will ship out to patient today. Patient was notified and verbalized understanding. All questions and concerns have been addressed.

## 2024-01-25 NOTE — Telephone Encounter (Signed)
Copied from CRM [id number]. Topic: Clinical - Prescription Issue >> Jan 25, 2024 11:14 AM Geroge Baseman wrote: Reason for CRM: valsartan (DIOVAN) 160 MG tablet, pharmacy stated that they had not hear back from the doctor on the refill requests. [phone] please call if any issues or if will be help up longer.

## 2024-02-13 ENCOUNTER — Other Ambulatory Visit: Payer: Self-pay | Admitting: Urology

## 2024-02-20 NOTE — Telephone Encounter (Signed)
 Victoria-daughter and patient advised

## 2024-02-27 ENCOUNTER — Other Ambulatory Visit: Payer: Self-pay | Admitting: Family Medicine

## 2024-02-28 ENCOUNTER — Other Ambulatory Visit: Payer: Self-pay | Admitting: Family Medicine

## 2024-02-28 ENCOUNTER — Telehealth: Payer: Self-pay | Admitting: Family Medicine

## 2024-02-28 DIAGNOSIS — E039 Hypothyroidism, unspecified: Secondary | ICD-10-CM

## 2024-02-28 MED ORDER — LEVOTHYROXINE SODIUM 75 MCG PO TABS: 75.0000 ug | ORAL_TABLET | Freq: Every day | ORAL | 3 refills | Status: DC

## 2024-02-28 NOTE — Telephone Encounter (Signed)
 Called patient and advised that her thyroid hormone prescription was sent to [facility] for a year.

## 2024-03-05 ENCOUNTER — Other Ambulatory Visit (INDEPENDENT_AMBULATORY_CARE_PROVIDER_SITE_OTHER): Payer: Self-pay | Admitting: Vascular Surgery

## 2024-03-05 DIAGNOSIS — I6521 Occlusion and stenosis of right carotid artery: Secondary | ICD-10-CM

## 2024-03-06 ENCOUNTER — Ambulatory Visit (INDEPENDENT_AMBULATORY_CARE_PROVIDER_SITE_OTHER): Payer: Medicare PPO | Admitting: Vascular Surgery

## 2024-03-06 ENCOUNTER — Encounter (INDEPENDENT_AMBULATORY_CARE_PROVIDER_SITE_OTHER): Payer: Self-pay | Admitting: Vascular Surgery

## 2024-03-06 ENCOUNTER — Ambulatory Visit (INDEPENDENT_AMBULATORY_CARE_PROVIDER_SITE_OTHER): Payer: Medicare PPO

## 2024-03-06 VITALS — BP 122/64 | HR 75 | Resp 16 | Ht 65.5 in | Wt 178.0 lb

## 2024-03-06 DIAGNOSIS — E118 Type 2 diabetes mellitus with unspecified complications: Secondary | ICD-10-CM

## 2024-03-06 DIAGNOSIS — I10 Essential (primary) hypertension: Secondary | ICD-10-CM

## 2024-03-06 DIAGNOSIS — I6521 Occlusion and stenosis of right carotid artery: Secondary | ICD-10-CM | POA: Diagnosis not present

## 2024-03-06 DIAGNOSIS — I6523 Occlusion and stenosis of bilateral carotid arteries: Secondary | ICD-10-CM | POA: Diagnosis not present

## 2024-03-06 DIAGNOSIS — I890 Lymphedema, not elsewhere classified: Secondary | ICD-10-CM | POA: Diagnosis not present

## 2024-03-06 DIAGNOSIS — Z794 Long term (current) use of insulin: Secondary | ICD-10-CM

## 2024-03-06 NOTE — Progress Notes (Signed)
 MRN : [id number]  [name] is a 83 y.o. (1941-06-28) female who presents with chief complaint of No chief complaint on file. Marland Kitchen  History of Present Illness: Patient returns today in follow up of after having not been seen for about 2-1/2 years.  She has had a litany of ongoing issues.  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  She has lymphedema in her legs and diligently wears her compression socks on a daily basis.  This does a pretty good job of keeping the swelling fairly mild.  No new wounds or ulceration.  No fevers or chills. She also has a known history of some carotid disease that had not been checked since 2022.  She has not had any focal neurologic symptoms. Specifically, the patient denies amaurosis fugax, speech or swallowing difficulties, or arm or leg weakness or numbness.  Her carotid duplex today shows no progression of her disease with mild carotid artery stenosis in the 1 to 39% range bilaterally.  Current Outpatient Medications  Medication Sig Dispense Refill   ACCU-CHEK AVIVA PLUS test strip USE 1 STRIP TWICE A DAY TO CHECK BLOOD SUGAR 100 each 0   acetaminophen (TYLENOL 8 HOUR) 650 MG CR tablet Take 1 tablet (650 mg total) by mouth every 8 (eight) hours as needed for pain. 30 tablet 1   aspirin 81 MG tablet Take 81 mg by mouth at bedtime.     BD PEN NEEDLE NANO U/F 32G X 4 MM MISC USE 1 PEN NEEDLE DAILY 100 each 3   Blood Glucose Monitoring Suppl (ACCU-CHEK AVIVA PLUS) W/DEVICE KIT Use to check blood sugar twice daily     carvedilol (COREG) 6.25 MG tablet Take 6.25 mg by mouth 2 (two) times daily.     chlorhexidine (HIBICLENS) 4 % external liquid Apply topically daily as needed. 118 mL 3   Cholecalciferol (D3 5000 PO) Take by mouth.     clobetasol cream (TEMOVATE) 0.05 % APPLY ONE APPLICATION ON THE SKIN TWICE A DAY AS NEEDED. AVOID FACE, GROIN, AND UNDERARMS. 30 g 0   co-enzyme Q-10 30 MG capsule Take 200 mg by mouth daily.      Cranberry-Cholecalciferol (SUPER CRANBERRY/VITAMIN D3 PO) Take by mouth.     cyanocobalamin (VITAMIN B12) 1000 MCG tablet Take 1,000 mcg by mouth daily.     ezetimibe (ZETIA) 10 MG tablet Take 1 tablet (10 mg total) by mouth daily. 30 tablet 6   furosemide (LASIX) 20 MG tablet Take 1 tablet (20 mg total) by mouth daily. 90 tablet 3   Insulin Glargine (LANTUS SOLOSTAR) 100 UNIT/ML Solostar Pen INJECT 26 UNITS INTO THE SKIN AT BEDTIME. (Patient taking differently: INJECT 18 UNITS INTO THE SKIN AT BEDTIME.) 15 mL 0   Lancets (ACCU-CHEK MULTICLIX) lancets Use as instructed to check blood sugar twice daily E11.9 102 each 3   levothyroxine (SYNTHROID) 75 MCG tablet Take 1 tablet (75 mcg total) by mouth daily before breakfast. 90 tablet 3   Magnesium 250 MG CAPS Take by mouth.     Misc Natural Products (BEET ROOT PO) Take by mouth.     Multiple Vitamins-Minerals (CENTRUM SILVER PO) Take 1 tablet by mouth daily.     MYRBETRIQ 50 MG TB24 tablet TAKE ONE TABLET BY MOUTH EVERY DAY 90 tablet 3   OVER THE COUNTER MEDICATION Take 1 tablet by mouth daily. Immune System Support Supplement includes :  Echinacea Zinc Vitamin C     potassium chloride (KLOR-CON) 10  MEQ tablet Take 10 mEq by mouth daily.     Probiotic Product (ALIGN PO) Take by mouth.     sertraline (ZOLOFT) 25 MG tablet Take 1 tablet (25 mg total) by mouth daily. 30 tablet 3   Turmeric (QC TUMERIC COMPLEX PO) Take by mouth.     valsartan (DIOVAN) 160 MG tablet Take 1 tablet (160 mg total) by mouth daily. 30 tablet 3   amoxicillin-clavulanate (AUGMENTIN) 875-125 MG tablet Take 1 tablet by mouth every 12 (twelve) hours. (Patient not taking: Reported on 03/06/2024) 14 tablet 0   No current facility-administered medications for this visit.    Past Medical History:  Diagnosis Date   Arthritis    Basal cell carcinoma 10/27/2010   Right lower medial cheek. Excised: 11/24/2010, margins free.   Breast cancer (HCC) 2000   Cancer (HCC)    Diabetes  mellitus    GERD (gastroesophageal reflux disease)    Hypertension    Lymph edema    Rt arm   Neuropathy    Osteoporosis    Personal history of chemotherapy 2000   right breast   Personal history of radiation therapy 2000   right breast   Thyroid disease     Past Surgical History:  Procedure Laterality Date   ANTERIOR AND POSTERIOR REPAIR N/A 10/18/2016   Procedure: ANTERIOR (CYSTOCELE) AND POSTERIOR REPAIR (RECTOCELE);  Surgeon: Hildred Laser, MD;  Location: [facility] ORS;  Service: Gynecology;  Laterality: N/A;   APPENDECTOMY     BACK SURGERY  1983   discectomy   BREAST EXCISIONAL BIOPSY Left 12/11/2010   benign.  BENIGN BREAST TISSUE WITH STROMAL FIBROSIS, SCLEROSING ADENOSIS    cartoid u/s  02/24/06   60-79% LICA stenosis   COLONOSCOPY     DOPPLER ECHOCARDIOGRAPHY  02/24/06   Triv MR, EF 55-60%   FOOT SURGERY  1992 & 1994   bilateral   HEMORROIDECTOMY     MASTECTOMY Right 2000   right, modified radical+ chemo, 2/23 nodes 4/03   miscarriage  1970   mumps  1965   h/o   OTHER SURGICAL HISTORY  04/03/02   dexa, wnl   OTHER SURGICAL HISTORY  03/12/04   dexa, wnl   OTHER SURGICAL HISTORY  08/12/05   dexa-osteopor Hip, osteopen spine   OTHER SURGICAL HISTORY  10/25/07   dexa- nml hip and spine   SEPTOPLASTY  1970   TONSILLECTOMY  1945   VAGINAL DELIVERY     x5   VAGINAL HYSTERECTOMY  1974   partial, fibroids   VENTRAL HERNIA REPAIR       Social History   Tobacco Use   Smoking status: Never   Smokeless tobacco: Never  Vaping Use   Vaping status: Never Used  Substance Use Topics   Alcohol use: No    Alcohol/week: 0.0 standard drinks of alcohol   Drug use: No      Family History  Problem Relation Age of Onset   Diabetes Mother    Hypertension Mother    Heart attack Mother    Heart failure Father    Hypertension Father    Stroke Father    Lung cancer Brother        hx of smoking   Stroke Brother    Heart attack Brother        PTCA MI x 2   Breast cancer  Paternal Aunt        paternal great aunt     Allergies  Allergen Reactions   Atorvastatin  Swelling    Leg swelling Other reaction(s): Unknown Leg swelling Leg swelling   Azithromycin Diarrhea and Nausea And Vomiting    Other reaction(s): Nausea And Vomiting   Ciprofloxacin Other (See Comments)    Does not remember Other reaction(s): Unknown tendonitis tendonitis   Doxycycline Nausea And Vomiting   Hydrocodone Bit-Homatrop Mbr     REACTION:    Hydrocodone Bit-Homatrop Mbr     Other reaction(s): Other (See Comments) Drowsiness Other reaction(s): Other (See Comments) REACTION:   Rosiglitazone Swelling    Other reaction(s): Unknown REACTION: SWELLING   Rosiglitazone Maleate     REACTION: SWELLING    REVIEW OF SYSTEMS (Negative unless checked)   Constitutional: [] Weight loss  [] Fever  [] Chills Cardiac: [] Chest pain   [] Chest pressure   [] Palpitations   [] Shortness of breath when laying flat   [] Shortness of breath at rest   [] Shortness of breath with exertion. Vascular:  [x] Pain in legs with walking   [] Pain in legs at rest   [] Pain in legs when laying flat   [] Claudication   [] Pain in feet when walking  [] Pain in feet at rest  [] Pain in feet when laying flat   [] History of DVT   [] Phlebitis   [x] Swelling in legs   [] Varicose veins   [] Non-healing ulcers Pulmonary:   [] Uses home oxygen   [] Productive cough   [] Hemoptysis   [] Wheeze  [] COPD   [] Asthma Neurologic:  [] Dizziness  [] Blackouts   [] Seizures   [] History of stroke   [] History of TIA  [] Aphasia   [] Temporary blindness   [] Dysphagia   [] Weakness or numbness in arms   [x] Weakness or numbness in legs Musculoskeletal:  [x] Arthritis   [] Joint swelling   [] Joint pain   [] Low back pain Hematologic:  [] Easy bruising  [] Easy bleeding   [] Hypercoagulable state   [] Anemic  [] Hepatitis Gastrointestinal:  [] Blood in stool   [] Vomiting blood  [] Gastroesophageal reflux/heartburn   [] Abdominal pain Genitourinary:  [] Chronic kidney  disease   [] Difficult urination  [] Frequent urination  [] Burning with urination   [] Hematuria Skin:  [] Rashes   [] Ulcers   [] Wounds Psychological:  [] History of anxiety   []  History of major depression.  Physical Examination  BP 122/64   Pulse 75   Resp 16   Ht 5' 5.5" (1.664 m)   Wt 178 lb (80.7 kg)   BMI 29.17 kg/m  Gen:  WD/WN, NAD.  Appears younger than stated age Head: [place]/AT, No temporalis wasting. Ear/Nose/Throat: Hearing grossly intact, nares w/o erythema or drainage Eyes: Conjunctiva clear. Sclera non-icteric Neck: Supple.  Trachea midline Pulmonary:  Good air movement, no use of accessory muscles.  Cardiac: RRR, no JVD Vascular:  Vessel Right Left  Radial Palpable Palpable           Musculoskeletal: M/S 5/5 throughout.  No deformity or atrophy.  1+ bilateral lower extremity edema.  Significant right arm edema in the 1-2+ range which is relatively well-controlled with her compression sleeve Neurologic: Sensation grossly intact in extremities.  Symmetrical.  Speech is fluent.  Psychiatric: Judgment intact, Mood & affect appropriate for pt's clinical situation. Dermatologic: No rashes or ulcers noted.  No cellulitis or open wounds.      Labs Recent Results (from the past 2160 hours)  Comprehensive metabolic panel     Status: Abnormal   Collection Time: 01/05/24 11:04 AM  Result Value Ref Range   Sodium 131 (L) 135 - 145 mmol/L   Potassium 4.8 3.5 - 5.1 mmol/L   Chloride 97 (L)  98 - 111 mmol/L   CO2 24 22 - 32 mmol/L   Glucose, Bld 157 (H) 70 - 99 mg/dL    Comment: Glucose reference range applies only to samples taken after fasting for at least 8 hours.   BUN 27 (H) 8 - 23 mg/dL   Creatinine, Ser 4.09 0.44 - 1.00 mg/dL   Calcium 9.2 8.9 - 81.1 mg/dL   Total Protein 6.9 6.5 - 8.1 g/dL   Albumin 3.7 3.5 - 5.0 g/dL   AST 20 15 - 41 U/L   ALT 15 0 - 44 U/L   Alkaline Phosphatase 45 38 - 126 U/L   Total Bilirubin 0.7 0.0 - 1.2 mg/dL   GFR, Estimated >91 >47  mL/min    Comment: (NOTE) Calculated using the CKD-EPI Creatinine Equation (2021)    Anion gap 10 5 - 15    Comment: Performed at [facility], [address] [place], [place] [address]  Hemoglobin A1c     Status: Abnormal   Collection Time: 01/05/24 11:04 AM  Result Value Ref Range   Hgb A1c MFr Bld 7.5 (H) 4.8 - 5.6 %    Comment: (NOTE) Pre diabetes:          5.7%-6.4%  Diabetes:              >6.4%  Glycemic control for   <7.0% adults with diabetes    Mean Plasma Glucose 168.55 mg/dL    Comment: Performed at [facility], [address], [place], [place] [address]  CBC with Differential/Platelet     Status: None   Collection Time: 01/05/24 11:04 AM  Result Value Ref Range   WBC 9.7 4.0 - 10.5 K/uL   RBC 4.27 3.87 - 5.11 MIL/uL   Hemoglobin 13.0 12.0 - 15.0 g/dL   HCT 65.7 84.6 - 96.2 %   MCV 90.6 80.0 - 100.0 fL   MCH 30.4 26.0 - 34.0 pg   MCHC 33.6 30.0 - 36.0 g/dL   RDW 95.2 84.1 - 32.4 %   Platelets 361 150 - 400 K/uL   nRBC 0.0 0.0 - 0.2 %   Neutrophils Relative % 76 %   Neutro Abs 7.4 1.7 - 7.7 K/uL   Lymphocytes Relative 12 %   Lymphs Abs 1.2 0.7 - 4.0 K/uL   Monocytes Relative 10 %   Monocytes Absolute 0.9 0.1 - 1.0 K/uL   Eosinophils Relative 1 %   Eosinophils Absolute 0.1 0.0 - 0.5 K/uL   Basophils Relative 0 %   Basophils Absolute 0.0 0.0 - 0.1 K/uL   Immature Granulocytes 1 %   Abs Immature Granulocytes 0.06 0.00 - 0.07 K/uL    Comment: Performed at [facility], [address] [place], [place] [address]  Lipid panel     Status: Abnormal   Collection Time: 01/05/24 11:04 AM  Result Value Ref Range   Cholesterol 182 0 - 200 mg/dL   Triglycerides 94 <725 mg/dL   HDL 42 >36 mg/dL   Total CHOL/HDL Ratio 4.3 RATIO   VLDL 19 0 - 40 mg/dL   LDL Cholesterol 644 (H) 0 - 99 mg/dL    Comment:        Total Cholesterol/HDL:CHD Risk Coronary Heart Disease Risk Table                     Men   Women  1/2 Average Risk   3.4   3.3   Average Risk  5.0   4.4  2 X Average Risk   9.6   7.1  3 X Average Risk  23.4   11.0        Use the calculated Patient Ratio above and the CHD Risk Table to determine the patient's CHD Risk.        ATP III CLASSIFICATION (LDL):  <100     mg/dL   Optimal  409-811  mg/dL   Near or Above                    Optimal  130-159  mg/dL   Borderline  914-782  mg/dL   High  >956     mg/dL   Very High Performed at [facility], [address] [place], [place] [address]   Urinalysis, Complete     Status: Abnormal   Collection Time: 01/06/24 11:56 AM  Result Value Ref Range   Specific Gravity, UA 1.010 1.005 - 1.030   pH, UA 5.0 5.0 - 7.5   Color, UA Orange Yellow   Appearance Ur Cloudy (A) Clear   Leukocytes,UA 3+ (A) Negative   Protein,UA 1+ (A) Negative/Trace   Glucose, UA Trace (A) Negative   Ketones, UA Negative Negative   RBC, UA 1+ (A) Negative   Bilirubin, UA Negative Negative   Urobilinogen, Ur 1.0 0.2 - 1.0 mg/dL   Nitrite, UA Positive (A) Negative   Microscopic Examination See below:   Microscopic Examination     Status: Abnormal   Collection Time: 01/06/24 11:56 AM   Urine  Result Value Ref Range   WBC, UA >30 (A) 0 - 5 /hpf   RBC, Urine 3-10 (A) 0 - 2 /hpf   Epithelial Cells (non renal) 0-10 0 - 10 /hpf   Mucus, UA Present (A) Not Estab.   Bacteria, UA Moderate (A) None seen/Few  CULTURE, URINE COMPREHENSIVE     Status: Abnormal   Collection Time: 01/06/24  1:02 PM   Specimen: Urine   UR  Result Value Ref Range   Urine Culture, Comprehensive Final report (A)    Organism ID, Bacteria Klebsiella pneumoniae (A)     Comment: Cefazolin <=4 ug/mL Cefazolin with an MIC <=16 predicts susceptibility to the oral agents cefaclor, cefdinir, cefpodoxime, cefprozil, cefuroxime, cephalexin, and loracarbef when used for therapy of uncomplicated urinary tract infections due to E. coli, Klebsiella pneumoniae, and Proteus mirabilis. Greater than 100,000 colony  forming units per mL    ANTIMICROBIAL SUSCEPTIBILITY Comment     Comment:       ** S = Susceptible; I = Intermediate; R = Resistant **                    P = Positive; N = Negative             MICS are expressed in micrograms per mL    Antibiotic                 RSLT#1    RSLT#2    RSLT#3    RSLT#4 Amoxicillin/Clavulanic Acid    S Ampicillin                     R Cefepime                       S Ceftriaxone                    S Cefuroxime  S Ciprofloxacin                  S Ertapenem                      S Gentamicin                     S Imipenem                       S Levofloxacin                   S Meropenem                      S Nitrofurantoin                 I Piperacillin/Tazobactam        S Tetracycline                   S Tobramycin                     S Trimethoprim/Sulfa             R     Radiology No results found.  Assessment/Plan  Carotid stenosis Her carotid duplex today shows no progression of her disease with mild carotid artery stenosis in the 1 to 39% range bilaterally.  She is on aspirin therapy which would be adequate for this mild disease.  We can check this every year or so with duplex.  Essential hypertension blood pressure control important in reducing the progression of atherosclerotic disease. On appropriate oral medications.     Type 2 diabetes mellitus without complications (HCC) blood glucose control important in reducing the progression of atherosclerotic disease. Also, involved in wound healing. On appropriate medications.   Lymphedema Symptoms are reasonably stable and well-controlled with conservative measures at this point.  Continue these measures for now with no further workup planned.  Festus Barren, MD  03/06/2024 4:45 PM    This note was created with Dragon medical transcription system.  Any errors from dictation are purely unintentional

## 2024-03-06 NOTE — Assessment & Plan Note (Signed)
 Her carotid duplex today shows no progression of her disease with mild carotid artery stenosis in the 1 to 39% range bilaterally.  She is on aspirin therapy which would be adequate for this mild disease.  We can check this every year or so with duplex.

## 2024-03-06 NOTE — Progress Notes (Unsigned)
 03/07/2024 3:22 PM   [name] 1940-12-14 [id number]  Referring provider: Alease Medina, MD [address] [place],  [place] [address]  Urological history: 1. rUTI's -Contributing factors of age, vaginal atrophy, diabetes, incontinence -Documented urine cultures over the last year  January 06, 2024-Klebsiella pneumoniae  November 01, 2023-Klebsiella pneumoniae  August 30, 2023 no growth  March 03, 2023-Staphylococcus epidermidis  -None -trimethoprim 100 mg daily  2. Mixed incontinence -Contributing factors of age, hysterectomy, vaginal births, diabetes, arthritis, hypertension, vaginal atrophy, pelvic prolapse and lymphedema -failed pessary  -Myrbetriq 50 mg daily  3. Nocturia -Risk factors for nocturia: hypertension, diabetes and arthritis  4. Renal cyst -contrast CT (2019) - lower pole of the right kidney there is a 2.1 cm simple cyst.   Chief Complaint  Patient presents with   Recurrent UTI   HPI: [name] is a 83 y.o. female who presents today for recheck on UA to ensure microscopic hematuria had resolved after treating the infection.  Previous records reviewed.    She states she is doing well.  Patient denies any modifying or aggravating factors.  Patient denies any recent UTI's, gross hematuria, dysuria or suprapubic/flank pain.  Patient denies any fevers, chills, nausea or vomiting.    UA yellow clear, specific every 1.020, pH 6.0, 0-5 WBCs, 0-2 RBCs, greater than 10 epithelial cells and a few bacteria.  PMH: Past Medical History:  Diagnosis Date   Arthritis    Basal cell carcinoma 10/27/2010   Right lower medial cheek. Excised: 11/24/2010, margins free.   Breast cancer (HCC) 2000   Cancer (HCC)    Diabetes mellitus    GERD (gastroesophageal reflux disease)    Hypertension    Lymph edema    Rt arm   Neuropathy    Osteoporosis    Personal history of chemotherapy 2000   right breast   Personal history of radiation therapy 2000    right breast   Thyroid disease     Surgical History: Past Surgical History:  Procedure Laterality Date   ANTERIOR AND POSTERIOR REPAIR N/A 10/18/2016   Procedure: ANTERIOR (CYSTOCELE) AND POSTERIOR REPAIR (RECTOCELE);  Surgeon: Hildred Laser, MD;  Location: [facility] ORS;  Service: Gynecology;  Laterality: N/A;   APPENDECTOMY     BACK SURGERY  1983   discectomy   BREAST EXCISIONAL BIOPSY Left 12/11/2010   benign.  BENIGN BREAST TISSUE WITH STROMAL FIBROSIS, SCLEROSING ADENOSIS    cartoid u/s  02/24/06   60-79% LICA stenosis   COLONOSCOPY     DOPPLER ECHOCARDIOGRAPHY  02/24/06   Triv MR, EF 55-60%   FOOT SURGERY  1992 & 1994   bilateral   HEMORROIDECTOMY     MASTECTOMY Right 2000   right, modified radical+ chemo, 2/23 nodes 4/03   miscarriage  1970   mumps  1965   h/o   OTHER SURGICAL HISTORY  04/03/02   dexa, wnl   OTHER SURGICAL HISTORY  03/12/04   dexa, wnl   OTHER SURGICAL HISTORY  08/12/05   dexa-osteopor Hip, osteopen spine   OTHER SURGICAL HISTORY  10/25/07   dexa- nml hip and spine   SEPTOPLASTY  1970   TONSILLECTOMY  1945   VAGINAL DELIVERY     x5   VAGINAL HYSTERECTOMY  1974   partial, fibroids   VENTRAL HERNIA REPAIR      Home Medications:  Allergies as of 03/07/2024       Reactions   Atorvastatin Swelling   Leg swelling Other reaction(s):  Unknown Leg swelling Leg swelling   Azithromycin Diarrhea, Nausea And Vomiting   Other reaction(s): Nausea And Vomiting   Ciprofloxacin Other (See Comments)   Does not remember Other reaction(s): Unknown tendonitis tendonitis   Doxycycline Nausea And Vomiting   Hydrocodone Bit-homatrop Mbr    REACTION:    Hydrocodone Bit-homatrop Mbr    Other reaction(s): Other (See Comments) Drowsiness Other reaction(s): Other (See Comments) REACTION:   Rosiglitazone Swelling   Other reaction(s): Unknown REACTION: SWELLING   Rosiglitazone Maleate    REACTION: SWELLING        Medication List        Accurate as of March 07, 2024  3:22 PM. If you have any questions, ask your nurse or doctor.          STOP taking these medications    amoxicillin-clavulanate 875-125 MG tablet Commonly known as: AUGMENTIN Stopped by: Dallas Torok       TAKE these medications    Accu-Chek Aviva Plus test strip Generic drug: glucose blood USE 1 STRIP TWICE A DAY TO CHECK BLOOD SUGAR   Accu-Chek Aviva Plus w/Device Kit Use to check blood sugar twice daily   accu-chek multiclix lancets Use as instructed to check blood sugar twice daily E11.9   acetaminophen 650 MG CR tablet Commonly known as: Tylenol 8 Hour Take 1 tablet (650 mg total) by mouth every 8 (eight) hours as needed for pain.   ALIGN PO Take by mouth.   aspirin 81 MG tablet Take 81 mg by mouth at bedtime.   BD Pen Needle Nano U/F 32G X 4 MM Misc Generic drug: Insulin Pen Needle USE 1 PEN NEEDLE DAILY   BEET ROOT PO Take by mouth.   carvedilol 6.25 MG tablet Commonly known as: COREG Take 1 tablet (6.25 mg total) by mouth 2 (two) times daily.   CENTRUM SILVER PO Take 1 tablet by mouth daily.   chlorhexidine 4 % external liquid Commonly known as: Hibiclens Apply topically daily as needed.   clobetasol cream 0.05 % Commonly known as: TEMOVATE APPLY ONE APPLICATION ON THE SKIN TWICE A DAY AS NEEDED. AVOID FACE, GROIN, AND UNDERARMS.   co-enzyme Q-10 30 MG capsule Take 200 mg by mouth daily.   cyanocobalamin 1000 MCG tablet Commonly known as: VITAMIN B12 Take 1,000 mcg by mouth daily.   D3 5000 PO Take by mouth.   ezetimibe 10 MG tablet Commonly known as: ZETIA Take 1 tablet (10 mg total) by mouth daily.   furosemide 20 MG tablet Commonly known as: LASIX Take 1 tablet (20 mg total) by mouth daily.   insulin glargine 100 UNIT/ML Solostar Pen Commonly known as: Lantus SoloStar INJECT 26 UNITS INTO THE SKIN AT BEDTIME. What changed: additional instructions   levothyroxine 75 MCG tablet Commonly known as: SYNTHROID Take  1 tablet (75 mcg total) by mouth daily before breakfast.   Magnesium 250 MG Caps Take by mouth.   Myrbetriq 50 MG Tb24 tablet Generic drug: mirabegron ER TAKE ONE TABLET BY MOUTH EVERY DAY   OVER THE COUNTER MEDICATION Take 1 tablet by mouth daily. Immune System Support Supplement includes :  Echinacea Zinc Vitamin C   potassium chloride 10 MEQ tablet Commonly known as: KLOR-CON Take 10 mEq by mouth daily.   QC TUMERIC COMPLEX PO Take by mouth.   sertraline 25 MG tablet Commonly known as: ZOLOFT Take 1 tablet (25 mg total) by mouth daily.   SUPER CRANBERRY/VITAMIN D3 PO Take by mouth.   valsartan 160  MG tablet Commonly known as: DIOVAN Take 1 tablet (160 mg total) by mouth daily.        Allergies:  Allergies  Allergen Reactions   Atorvastatin Swelling    Leg swelling Other reaction(s): Unknown Leg swelling Leg swelling   Azithromycin Diarrhea and Nausea And Vomiting    Other reaction(s): Nausea And Vomiting   Ciprofloxacin Other (See Comments)    Does not remember Other reaction(s): Unknown tendonitis tendonitis   Doxycycline Nausea And Vomiting   Hydrocodone Bit-Homatrop Mbr     REACTION:    Hydrocodone Bit-Homatrop Mbr     Other reaction(s): Other (See Comments) Drowsiness Other reaction(s): Other (See Comments) REACTION:   Rosiglitazone Swelling    Other reaction(s): Unknown REACTION: SWELLING   Rosiglitazone Maleate     REACTION: SWELLING    Family History: Family History  Problem Relation Age of Onset   Diabetes Mother    Hypertension Mother    Heart attack Mother    Heart failure Father    Hypertension Father    Stroke Father    Lung cancer Brother        hx of smoking   Stroke Brother    Heart attack Brother        PTCA MI x 2   Breast cancer Paternal Aunt        paternal great aunt    Social History:  reports that she has never smoked. She has never used smokeless tobacco. She reports that she does not drink alcohol and does  not use drugs.  ROS: Pertinent ROS in HPI  Physical Exam: BP 120/70   Pulse 90   Ht 5\' 5"  (1.651 m)   Wt 178 lb (80.7 kg)   BMI 29.62 kg/m   Constitutional:  Well nourished. Alert and oriented, No acute distress. HEENT: [place] AT, moist mucus membranes.  Trachea midline, no masses. Cardiovascular: No clubbing, cyanosis, or edema. Respiratory: Normal respiratory effort, no increased work of breathing. GU: No CVA tenderness.  No bladder fullness or masses.  Recession of labia minora, dry, pale vulvar vaginal mucosa and loss of mucosal ridges and folds.  Normal urethral meatus, no lesions, no prolapse, no discharge.   No urethral masses, tenderness and/or tenderness. No bladder fullness, tenderness or masses. Pale vagina mucosa, poor estrogen effect, no discharge, no lesions, poor pelvic support, Grade III cystocele.  Anus and perineum are without rashes or lesions.    Neurologic: Grossly intact, no focal deficits, moving all 4 extremities. Psychiatric: Normal mood and affect.    Laboratory Data: Urinalysis See Epic and HPI I have reviewed the labs.   Pertinent Imaging: N/A   In and Out Catheterization Patient is present today for a I & O catheterization due to micro heme. Patient was cleaned and prepped in a sterile fashion with betadine . A 14 FR cath was inserted no complications were noted , 50 ml of urine return was noted, urine was yellow in color. A clean urine sample was collected for UA and possible culture. Bladder was drained  And catheter was removed with out difficulty.    Performed by: Michiel Cowboy, PA-C and Benay Pike, CMA and Emi Belfast, CMA   1. rUTI's -asymptomatic at today's visit  2. GSM -continue vaginal lubricants with hyaluronic acid for symptom relief -advised to use the vaginal lubricants daily and liberally  3. Pelvic floor laxity -She does not empty her bladder completely and she has severe prolapse  -Manage conservatively  4.  Microscopic hematuria -  UA w/o micro heme -Resolved with treatment of infection  5.  Incontinence -She does not feel that the Myrbetriq is working for her urinary incontinence, so we will go ahead and discontinue the Myrbetriq for 30 days and she will contact me to let me know if her urinary symptoms worsen  Return for via MyChart .  These notes generated with voice recognition software. I apologize for typographical errors.  Cloretta Ned  [facility] [address]  [address] [place], [place] [address] [phone]

## 2024-03-07 ENCOUNTER — Other Ambulatory Visit: Payer: Self-pay

## 2024-03-07 ENCOUNTER — Other Ambulatory Visit: Payer: Self-pay | Admitting: Family Medicine

## 2024-03-07 ENCOUNTER — Encounter: Payer: Self-pay | Admitting: Urology

## 2024-03-07 ENCOUNTER — Ambulatory Visit: Admitting: Urology

## 2024-03-07 VITALS — BP 120/70 | HR 90 | Ht 65.0 in | Wt 178.0 lb

## 2024-03-07 DIAGNOSIS — I10 Essential (primary) hypertension: Secondary | ICD-10-CM

## 2024-03-07 DIAGNOSIS — N8189 Other female genital prolapse: Secondary | ICD-10-CM

## 2024-03-07 DIAGNOSIS — R3129 Other microscopic hematuria: Secondary | ICD-10-CM | POA: Diagnosis not present

## 2024-03-07 DIAGNOSIS — N390 Urinary tract infection, site not specified: Secondary | ICD-10-CM

## 2024-03-07 DIAGNOSIS — N952 Postmenopausal atrophic vaginitis: Secondary | ICD-10-CM | POA: Diagnosis not present

## 2024-03-07 DIAGNOSIS — N3946 Mixed incontinence: Secondary | ICD-10-CM

## 2024-03-07 LAB — URINALYSIS, COMPLETE
Bilirubin, UA: NEGATIVE
Glucose, UA: NEGATIVE
Ketones, UA: NEGATIVE
Leukocytes,UA: NEGATIVE
Nitrite, UA: NEGATIVE
Protein,UA: NEGATIVE
RBC, UA: NEGATIVE
Specific Gravity, UA: 1.02 (ref 1.005–1.030)
Urobilinogen, Ur: 0.2 mg/dL (ref 0.2–1.0)
pH, UA: 6 (ref 5.0–7.5)

## 2024-03-07 LAB — MICROSCOPIC EXAMINATION: Epithelial Cells (non renal): >10 /HPF — AB (ref 0–10)

## 2024-03-07 MED ORDER — CARVEDILOL 6.25 MG PO TABS: 6.2500 mg | ORAL_TABLET | Freq: Two times a day (BID) | ORAL | 0 refills | Status: DC

## 2024-03-07 NOTE — Addendum Note (Signed)
 Addended by: Tonny Bollman on: 03/07/2024 03:28 PM   Modules accepted: Orders

## 2024-03-07 NOTE — Addendum Note (Signed)
 Addended by: Tonny Bollman on: 03/07/2024 03:41 PM   Modules accepted: Orders

## 2024-03-07 NOTE — Telephone Encounter (Signed)
 Copied from CRM [id number]. Topic: Clinical - Medication Refill >> Mar 07, 2024 11:25 AM Izetta Dakin wrote: Most Recent Primary Care Visit:  Provider: Alease Medina  Department: [facility]  Visit Type: NEW PATIENT  Date: 01/03/2024  Medication: carvedilol (COREG) 6.25 MG tablet  Has the patient contacted their pharmacy? Yes (Agent: If no, request that the patient contact the pharmacy for the refill. If patient does not wish to contact the pharmacy document the reason why and proceed with request.) (Agent: If yes, when and what did the pharmacy advise?)  Is this the correct pharmacy for this prescription? Yes If no, delete pharmacy and type the correct one.  This is the patient's preferred pharmacy:  [facility] - [place], [place] - [address] [place] [address] Phone: [phone] Fax: [phone] Phone: [phone]    Has the prescription been filled recently? No  Is the patient out of the medication? Yes  Has the patient been seen for an appointment in the last year OR does the patient have an upcoming appointment? Yes  Can we respond through MyChart? Yes  Agent: Please be advised that Rx refills may take up to 3 business days. We ask that you follow-up with your pharmacy.

## 2024-04-02 ENCOUNTER — Other Ambulatory Visit: Payer: Self-pay | Admitting: Family Medicine

## 2024-04-03 ENCOUNTER — Ambulatory Visit: Payer: Medicare PPO | Admitting: Family Medicine

## 2024-04-03 ENCOUNTER — Encounter: Payer: Self-pay | Admitting: Family Medicine

## 2024-04-03 VITALS — BP 128/64 | HR 87 | Resp 18 | Ht 65.0 in | Wt 178.0 lb

## 2024-04-03 DIAGNOSIS — I10 Essential (primary) hypertension: Secondary | ICD-10-CM

## 2024-04-03 DIAGNOSIS — I152 Hypertension secondary to endocrine disorders: Secondary | ICD-10-CM

## 2024-04-03 DIAGNOSIS — E118 Type 2 diabetes mellitus with unspecified complications: Secondary | ICD-10-CM

## 2024-04-03 DIAGNOSIS — Z794 Long term (current) use of insulin: Secondary | ICD-10-CM

## 2024-04-03 DIAGNOSIS — Z9181 History of falling: Secondary | ICD-10-CM

## 2024-04-03 DIAGNOSIS — R5383 Other fatigue: Secondary | ICD-10-CM

## 2024-04-03 DIAGNOSIS — R600 Localized edema: Secondary | ICD-10-CM

## 2024-04-03 DIAGNOSIS — E1159 Type 2 diabetes mellitus with other circulatory complications: Secondary | ICD-10-CM

## 2024-04-03 DIAGNOSIS — E039 Hypothyroidism, unspecified: Secondary | ICD-10-CM

## 2024-04-03 LAB — POCT GLYCOSYLATED HEMOGLOBIN (HGB A1C): Hemoglobin A1C: 7.9 % — AB (ref 4.0–5.6)

## 2024-04-03 NOTE — Assessment & Plan Note (Signed)
 2+ pitting edema bilaterally

## 2024-04-03 NOTE — Assessment & Plan Note (Addendum)
 Has to take a nap in the middle the day now she gets so tired.  Checking thyroid  function, electrolytes and renal function.  Checking hemoglobin

## 2024-04-03 NOTE — Progress Notes (Signed)
 Established Patient Office Visit  Subjective   Patient ID: [name], female    DOB: [birth year]  Age: 83 y.o. MRN: [id number]  Chief Complaint  Patient presents with   Medical Management of Chronic Issues    HPI Delightful 83 yo woman with recurrent UTI (on suppression therapy with trimethoprim ), DMT2, bilateral leg edema, HTN, cystocele, mixed hyperlipidemia, hyponatremia, hypothyroidism, lymphedema, osteoporosis, and Breast cancer.  She is walking with a rollator.  She has not had a recent fall.  She has a Engineer, mining. She just followed up with urology.  She still on trimethoprim  100 mg daily.  Her urine was clean. She just saw vascular surgery and had carotid Dopplers.  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 She is taking 21 to 22 units of Lantus  at night and her fasting blood sugars running about 100.  Please cut your Lantus  down to about 20 units.  The goal is to have fasting blood sugar around 130.  A1c today 7.9% She complains of being tired.  She often has to take a nap in the middle of the day. She is getting shots for her shoulders and reports that her right shoulder is much worse than her left.  She would be willing to do physical therapy for her shoulders.    ROS    Objective:     BP 128/64 (BP Location: Left Arm, Patient Position: Sitting, Cuff Size: Normal)   Pulse 87   Resp 18   Ht 5\' 5"  (1.651 m)   Wt 178 lb (80.7 kg)   SpO2 97%   BMI 29.62 kg/m    Physical Exam Vitals and nursing note reviewed.  Constitutional:      Appearance: Normal appearance.  HENT:     Head: Normocephalic and atraumatic.  Eyes:     Conjunctiva/sclera: Conjunctivae normal.  Cardiovascular:     Rate and Rhythm: Normal rate and regular rhythm.  Pulmonary:     Effort: Pulmonary effort is normal.     Breath sounds: Normal breath sounds.  Musculoskeletal:        General: Swelling (right arm) present.     Right lower leg: Edema present.     Left lower leg: Edema  present.  Skin:    General: Skin is warm and dry.  Neurological:     Mental Status: She is alert and oriented to person, place, and time.  Psychiatric:        Mood and Affect: Mood normal.        Behavior: Behavior normal.        Thought Content: Thought content normal.        Judgment: Judgment normal.          Results for orders placed or performed in visit on 04/03/24  POCT glycosylated hemoglobin (Hb A1C)  Result Value Ref Range   Hemoglobin A1C 7.9 (A) 4.0 - 5.6 %   HbA1c POC (<> result, manual entry)     HbA1c, POC (prediabetic range)     HbA1c, POC (controlled diabetic range)        The ASCVD Risk score (Arnett DK, et al., 2019) failed to calculate for the following reasons:   The 2019 ASCVD risk score is only valid for ages 58 to 34    Assessment & Plan:  Controlled type 2 diabetes mellitus with complication, with long-term current use of insulin  (HCC) Assessment & Plan: 20 to 22 units of Lantus  nightly.  Goal is fasting blood sugars at 130.  A1c today 7.9%.  Orders: -     POCT glycosylated hemoglobin (Hb A1C) -     Lipid panel -     CBC with Differential/Platelet -     Comprehensive metabolic panel with GFR -     Microalbumin / creatinine urine ratio  Hypertension associated with diabetes (HCC) -     CBC with Differential/Platelet  Bilateral leg edema Assessment & Plan: 2+ pitting edema bilaterally   Essential hypertension Assessment & Plan: Hypertension well-controlled with Coreg  6.25 twice daily.   Acquired hypothyroidism -     TSH + free T4  At high risk for falls per Katy Parry fall risk assessment scale Assessment & Plan: [facility] with a rollator all the time.  Does not get out in her yard because she is afraid of falling.   Other fatigue Assessment & Plan: Has to take a nap in the middle the day now she gets so tired.  Checking thyroid  function, electrolytes and renal function.  Checking hemoglobin      Return in about 3 months (around  07/03/2024) for BP recheck, chronic followup.    Maily Debarge K Sahas Sluka, MD

## 2024-04-03 NOTE — Assessment & Plan Note (Signed)
 Walks with a rollator all the time.  Does not get out in her yard because she is afraid of falling.

## 2024-04-03 NOTE — Assessment & Plan Note (Signed)
 Hypertension well-controlled with Coreg  6.25 twice daily.

## 2024-04-03 NOTE — Assessment & Plan Note (Signed)
 20 to 22 units of Lantus  nightly.  Goal is fasting blood sugars at 130.  A1c today 7.9%.

## 2024-04-04 LAB — CBC WITH DIFFERENTIAL/PLATELET
Basophils Absolute: 0.1 10*3/uL (ref 0.0–0.2)
Basos: 1 %
EOS (ABSOLUTE): 0.2 10*3/uL (ref 0.0–0.4)
Eos: 2 %
Hematocrit: 39.6 % (ref 34.0–46.6)
Hemoglobin: 13.2 g/dL (ref 11.1–15.9)
Immature Grans (Abs): 0 10*3/uL (ref 0.0–0.1)
Immature Granulocytes: 0 %
Lymphocytes Absolute: 1.4 10*3/uL (ref 0.7–3.1)
Lymphs: 13 %
MCH: 30.5 pg (ref 26.6–33.0)
MCHC: 33.3 g/dL (ref 31.5–35.7)
MCV: 92 fL (ref 79–97)
Monocytes Absolute: 0.9 10*3/uL (ref 0.1–0.9)
Monocytes: 8 %
Neutrophils Absolute: 8.4 10*3/uL — ABNORMAL HIGH (ref 1.4–7.0)
Neutrophils: 76 %
Platelets: 399 10*3/uL (ref 150–450)
RBC: 4.33 x10E6/uL (ref 3.77–5.28)
RDW: 11.9 % (ref 11.7–15.4)
WBC: 10.9 10*3/uL — ABNORMAL HIGH (ref 3.4–10.8)

## 2024-04-04 LAB — COMPREHENSIVE METABOLIC PANEL WITH GFR
ALT: 15 IU/L (ref 0–32)
AST: 23 IU/L (ref 0–40)
Albumin: 4.1 g/dL (ref 3.7–4.7)
Alkaline Phosphatase: 72 IU/L (ref 44–121)
BUN/Creatinine Ratio: 32 — ABNORMAL HIGH (ref 12–28)
BUN: 23 mg/dL (ref 8–27)
Bilirubin Total: 0.3 mg/dL (ref 0.0–1.2)
CO2: 22 mmol/L (ref 20–29)
Calcium: 9.5 mg/dL (ref 8.7–10.3)
Chloride: 97 mmol/L (ref 96–106)
Creatinine, Ser: 0.72 mg/dL (ref 0.57–1.00)
Globulin, Total: 2.6 g/dL (ref 1.5–4.5)
Glucose: 239 mg/dL — ABNORMAL HIGH (ref 70–99)
Potassium: 4.8 mmol/L (ref 3.5–5.2)
Sodium: 135 mmol/L (ref 134–144)
Total Protein: 6.7 g/dL (ref 6.0–8.5)
eGFR: 82 mL/min/{1.73_m2} (ref >59)

## 2024-04-04 LAB — LIPID PANEL
Chol/HDL Ratio: 4.9 ratio — ABNORMAL HIGH (ref 0.0–4.4)
Cholesterol, Total: 220 mg/dL — ABNORMAL HIGH (ref 100–199)
HDL: 45 mg/dL (ref >39)
LDL Chol Calc (NIH): 129 mg/dL — ABNORMAL HIGH (ref 0–99)
Triglycerides: 257 mg/dL — ABNORMAL HIGH (ref 0–149)
VLDL Cholesterol Cal: 46 mg/dL — ABNORMAL HIGH (ref 5–40)

## 2024-04-04 LAB — MICROALBUMIN / CREATININE URINE RATIO
Creatinine, Urine: 18.3 mg/dL
Microalb/Creat Ratio: 21 mg/g{creat} (ref 0–29)
Microalbumin, Urine: 3.8 ug/mL

## 2024-04-05 ENCOUNTER — Encounter: Payer: Self-pay | Admitting: Family Medicine

## 2024-04-06 ENCOUNTER — Telehealth: Payer: Self-pay | Admitting: Family Medicine

## 2024-04-06 ENCOUNTER — Encounter: Payer: Self-pay | Admitting: Family Medicine

## 2024-04-06 ENCOUNTER — Other Ambulatory Visit: Payer: Self-pay | Admitting: Family Medicine

## 2024-04-06 DIAGNOSIS — E782 Mixed hyperlipidemia: Secondary | ICD-10-CM

## 2024-04-06 MED ORDER — ROSUVASTATIN CALCIUM 10 MG PO TABS: 10.0000 mg | ORAL_TABLET | Freq: Every day | ORAL | 3 refills | Status: DC

## 2024-04-06 NOTE — Telephone Encounter (Signed)
 Copied from CRM [id number]. Topic: Clinical - Medication Question >> Apr 06, 2024  1:40 PM Shardie S wrote: Reason for CRM: Patient's daughter, Vina Greaves, calling to find out if patient should increase the amount of medication, Zetia . Callback [phone]

## 2024-04-20 ENCOUNTER — Other Ambulatory Visit: Payer: Self-pay | Admitting: Family Medicine

## 2024-04-26 LAB — TSH+FREE T4
Free T4: 1.65 ng/dL (ref 0.82–1.77)
TSH: 0.617 u[IU]/mL (ref 0.450–4.500)

## 2024-04-26 LAB — SPECIMEN STATUS REPORT

## 2024-04-26 NOTE — Telephone Encounter (Signed)
 Pharmacy called about this medication and is requesting a 90 day supply. Please advise.

## 2024-04-27 ENCOUNTER — Ambulatory Visit: Payer: Self-pay | Admitting: Family Medicine

## 2024-05-01 ENCOUNTER — Other Ambulatory Visit: Payer: Self-pay | Admitting: Family Medicine

## 2024-05-01 ENCOUNTER — Encounter (INDEPENDENT_AMBULATORY_CARE_PROVIDER_SITE_OTHER): Payer: Self-pay

## 2024-05-01 NOTE — Telephone Encounter (Signed)
 Copied from CRM [id number]. Topic: Clinical - Medication Refill >> May 01, 2024  4:49 PM Felizardo Hotter wrote: Medication: sertraline  (ZOLOFT ) 25 MG tablet  Has the patient contacted their pharmacy? Yes (Agent: If no, request that the patient contact the pharmacy for the refill. If patient does not wish to contact the pharmacy document the reason why and proceed with request.) (Agent: If yes, when and what did the pharmacy advise?)Pharmacy need PCP approval  This is the patient's preferred pharmacy:  [facility] - [place], [place] - [address] [place] [address] Phone: [phone] Fax: [phone]  Is this the correct pharmacy for this prescription? Yes If no, delete pharmacy and type the correct one.   Has the prescription been filled recently? Yes  Is the patient out of the medication? Yes  Has the patient been seen for an appointment in the last year OR does the patient have an upcoming appointment? Yes  Can we respond through MyChart? Yes  Agent: Please be advised that Rx refills may take up to 3 business days. We ask that you follow-up with your pharmacy.

## 2024-05-02 MED ORDER — SERTRALINE HCL 25 MG PO TABS: 25.0000 mg | ORAL_TABLET | Freq: Every day | ORAL | 1 refills | Status: DC

## 2024-05-02 NOTE — Telephone Encounter (Signed)
 6 month supply has been sent per order of Dr. Ziglar. Patient will need a follow up appointment.

## 2024-05-08 ENCOUNTER — Other Ambulatory Visit: Payer: Self-pay | Admitting: Family Medicine

## 2024-06-07 ENCOUNTER — Other Ambulatory Visit: Payer: Self-pay | Admitting: Family Medicine

## 2024-06-07 DIAGNOSIS — I10 Essential (primary) hypertension: Secondary | ICD-10-CM

## 2024-06-12 ENCOUNTER — Other Ambulatory Visit: Payer: Self-pay | Admitting: Family Medicine

## 2024-06-12 ENCOUNTER — Telehealth: Payer: Self-pay | Admitting: Family Medicine

## 2024-06-12 MED ORDER — ACCU-CHEK AVIVA PLUS VI STRP: ORAL_STRIP | 0 refills | Status: AC

## 2024-06-12 NOTE — Telephone Encounter (Signed)
 Copied from CRM [id number]. Topic: Clinical - Medication Refill >> Jun 12, 2024 11:36 AM Sophia H wrote: Medication: ACCU-CHEK AVIVA PLUS test strip   Has the patient contacted their pharmacy? Yes, pharmacy states out of refills   This is the patient's preferred pharmacy:  [facility] - [place], [place] - [address] [place] [address] Phone: [phone] Fax: [phone]   Is this the correct pharmacy for this prescription? Yes If no, delete pharmacy and type the correct one.   Has the prescription been filled recently? Yes  Is the patient out of the medication? Yes  Has the patient been seen for an appointment in the last year OR does the patient have an upcoming appointment? Yes  Can we respond through MyChart? Yes  Agent: Please be advised that Rx refills may take up to 3 business days. We ask that you follow-up with your pharmacy.

## 2024-07-10 ENCOUNTER — Other Ambulatory Visit: Payer: Self-pay | Admitting: Family Medicine

## 2024-07-11 ENCOUNTER — Telehealth: Payer: Self-pay | Admitting: Family Medicine

## 2024-07-11 DIAGNOSIS — E782 Mixed hyperlipidemia: Secondary | ICD-10-CM

## 2024-07-11 NOTE — Telephone Encounter (Signed)
 Copied from CRM [id number]. Topic: Clinical - Medication Prior Auth >> Jul 11, 2024 12:01 PM Tiffini S wrote: Reason for CRM: Patient called stating that [facility] submitted a prior authorization yesterday on 07/10/24 forezetimibe (ZETIA ) 10 MG tablet-  will refax the request  in today

## 2024-07-13 ENCOUNTER — Encounter: Payer: Self-pay | Admitting: Family Medicine

## 2024-07-13 ENCOUNTER — Ambulatory Visit: Admitting: Family Medicine

## 2024-07-13 VITALS — BP 156/78 | HR 88 | Temp 98.5°F | Resp 18 | Ht 65.0 in | Wt 172.0 lb

## 2024-07-13 DIAGNOSIS — E039 Hypothyroidism, unspecified: Secondary | ICD-10-CM

## 2024-07-13 DIAGNOSIS — E782 Mixed hyperlipidemia: Secondary | ICD-10-CM

## 2024-07-13 DIAGNOSIS — G8929 Other chronic pain: Secondary | ICD-10-CM

## 2024-07-13 DIAGNOSIS — E118 Type 2 diabetes mellitus with unspecified complications: Secondary | ICD-10-CM | POA: Diagnosis not present

## 2024-07-13 DIAGNOSIS — M25512 Pain in left shoulder: Secondary | ICD-10-CM

## 2024-07-13 DIAGNOSIS — Z794 Long term (current) use of insulin: Secondary | ICD-10-CM | POA: Diagnosis not present

## 2024-07-13 DIAGNOSIS — I10 Essential (primary) hypertension: Secondary | ICD-10-CM

## 2024-07-13 DIAGNOSIS — M25511 Pain in right shoulder: Secondary | ICD-10-CM | POA: Diagnosis not present

## 2024-07-13 DIAGNOSIS — F339 Major depressive disorder, recurrent, unspecified: Secondary | ICD-10-CM | POA: Diagnosis not present

## 2024-07-13 LAB — POCT GLYCOSYLATED HEMOGLOBIN (HGB A1C): Hemoglobin A1C: 8.3 % — AB (ref 4.0–5.6)

## 2024-07-13 MED ORDER — CARVEDILOL 6.25 MG PO TABS: 6.2500 mg | ORAL_TABLET | Freq: Two times a day (BID) | ORAL | 0 refills | Status: AC

## 2024-07-13 MED ORDER — LEVOTHYROXINE SODIUM 75 MCG PO TABS
75.0000 ug | ORAL_TABLET | Freq: Every day | ORAL | 3 refills | Status: AC
Start: 2024-07-13 — End: ?

## 2024-07-13 MED ORDER — INSULIN LISPRO 100 UNIT/ML IJ SOLN: INTRAMUSCULAR | 11 refills | Status: AC

## 2024-07-13 MED ORDER — EZETIMIBE 10 MG PO TABS: 10.0000 mg | ORAL_TABLET | Freq: Every day | ORAL | 1 refills | Status: AC

## 2024-07-13 MED ORDER — VALSARTAN 160 MG PO TABS: 160.0000 mg | ORAL_TABLET | Freq: Every day | ORAL | 1 refills | Status: AC

## 2024-07-13 MED ORDER — ROSUVASTATIN CALCIUM 10 MG PO TABS: 10.0000 mg | ORAL_TABLET | Freq: Every day | ORAL | 3 refills | Status: AC

## 2024-07-13 MED ORDER — SERTRALINE HCL 25 MG PO TABS: 25.0000 mg | ORAL_TABLET | Freq: Every day | ORAL | 1 refills | Status: AC

## 2024-07-13 MED ORDER — COENZYME Q10 30 MG PO CAPS: 200.0000 mg | ORAL_CAPSULE | Freq: Every day | ORAL | 1 refills | Status: AC

## 2024-07-13 NOTE — Telephone Encounter (Signed)
 [facility] states they are requesting refill not prior authorization. Refill has been sent.

## 2024-07-13 NOTE — Progress Notes (Addendum)
 Established Patient Office Visit  Subjective   Patient ID: [name], female    DOB: May 14, 1941  Age: 83 y.o. MRN: [id number]  Chief Complaint  Patient presents with   Medical Management of Chronic Issues    HPI  Delightful 83 yo woman with recurrent UTI (on suppression therapy with trimethoprim ), DMT2, bilateral leg edema, HTN, cystocele, mixed hyperlipidemia, hyponatremia, hypothyroidism, lymphedema, osteoporosis, and Breast cancer.   She is walking with a rollator. She has not had a recent fall. She has a Engineer, mining.  Her A1c today is 8.3% up from 7.93 months ago.  She takes Lantus  18 to 25 units depending on what her blood sugar is and how recently she has had a steroid shot for her shoulder.  Her fasting blood sugar this morning was 111 and that is her average. She has been taking an 81 mg aspirin .  Ask her to stop that because the risk-benefit ratio is probably no longer in her favor. Last cholesterol was total 220, Triggs 257, HDL 45 and LDL 129.  She is on Crestor  10 mg daily.  We need to check her labs. She does a pedal bicycle for exercise.  Her knees hurt so she uses liniment for them and that seems to help.  She just reports that she has got low energy.  She gets 3400 steps a day but now she has to take a nap once a day. She has chronic pain in both shoulders and has decreased [name].  Discussed going to PT and see if that helps.    ROS    Objective:     BP (!) 156/78 (BP Location: Left Arm, Patient Position: Sitting, Cuff Size: Large)   Pulse 88   Temp 98.5 F (36.9 C) (Oral)   Resp 18   Ht 5' 5 (1.651 m)   Wt 172 lb (78 kg) Comment: pt reported. declined scale  SpO2 97%   BMI 28.62 kg/m    Physical Exam Vitals and nursing note reviewed.  Constitutional:      Appearance: Normal appearance.  HENT:     Head: Normocephalic and atraumatic.  Eyes:     Conjunctiva/sclera: Conjunctivae normal.  Cardiovascular:     Rate and Rhythm: Normal rate and regular  rhythm.  Pulmonary:     Effort: Pulmonary effort is normal.     Breath sounds: Normal breath sounds.  Musculoskeletal:     Right lower leg: No edema.     Left lower leg: No edema.  Skin:    General: Skin is warm and dry.  Neurological:     Mental Status: She is alert and oriented to person, place, and time.  Psychiatric:        Mood and Affect: Mood normal.        Behavior: Behavior normal.        Thought Content: Thought content normal.        Judgment: Judgment normal.          Results for orders placed or performed in visit on 07/13/24  POCT glycosylated hemoglobin (Hb A1C)  Result Value Ref Range   Hemoglobin A1C 8.3 (A) 4.0 - 5.6 %   HbA1c POC (<> result, manual entry)     HbA1c, POC (prediabetic range)     HbA1c, POC (controlled diabetic range)        The ASCVD Risk score (Arnett DK, et al., 2019) failed to calculate for the following reasons:   The 2019 ASCVD risk score is only  valid for ages 76 to 80    Assessment & Plan:  Controlled type 2 diabetes mellitus with complication, with long-term current use of insulin  [facility]) Assessment & Plan: Please do not take any more Lantus  than 23 units a day because your fasting blood sugar was 111 this morning.  Adding Humalog  3 units at meals to bring her postprandial blood sugars under better control.  Would like to see her A1c below 8%  Orders: -     POCT glycosylated hemoglobin (Hb A1C) -     Comprehensive metabolic panel with GFR; Future -     Insulin  Lispro; Take 3 units with meals  Dispense: 10 mL; Refill: 11  Essential hypertension -     Carvedilol ; Take 1 tablet (6.25 mg total) by mouth 2 (two) times daily with a meal.  Dispense: 180 tablet; Refill: 0 -     Valsartan ; Take 1 tablet (160 mg total) by mouth daily.  Dispense: 90 tablet; Refill: 1 -     CBC with Differential/Platelet; Future -     Lipid panel; Future -     TSH + free T4; Future  Acquired hypothyroidism -     Levothyroxine  Sodium; Take 1 tablet (75  mcg total) by mouth daily before breakfast.  Dispense: 90 tablet; Refill: 3  Mixed hyperlipidemia -     Coenzyme Q10; Take 7 capsules (210 mg total) by mouth daily.  Dispense: 630 capsule; Refill: 1 -     Rosuvastatin  Calcium ; Take 1 tablet (10 mg total) by mouth daily.  Dispense: 90 tablet; Refill: 3  Depression, recurrent (HCC) -     Sertraline  HCl; Take 1 tablet (25 mg total) by mouth daily.  Dispense: 90 tablet; Refill: 1  Chronic pain of both shoulders -     Ambulatory referral to Physical Therapy     Return in about 3 months (around 10/13/2024).    Jaeley Wiker K Natane Heward, MD

## 2024-07-13 NOTE — Telephone Encounter (Unsigned)
 Copied from CRM [id number]. Topic: Clinical - Medication Prior Auth >> Jul 11, 2024 12:01 PM Tiffini S wrote: Reason for CRM: Patient called stating that [facility] submitted a prior authorization yesterday on 07/10/24 forezetimibe (ZETIA ) 10 MG tablet-  will refax the request  in today >> Jul 12, 2024  3:04 PM Montie POUR wrote: Tippi is calling back to check on prior authorization for ezetimibe  (ZETIA ) 10 MG tablet. Please give her a call at [phone] when this is completed. She will run out of medication on Monday (07-20-24).

## 2024-07-14 ENCOUNTER — Encounter: Payer: Self-pay | Admitting: Family Medicine

## 2024-07-15 ENCOUNTER — Encounter: Payer: Self-pay | Admitting: Family Medicine

## 2024-07-16 ENCOUNTER — Encounter: Payer: Self-pay | Admitting: Family Medicine

## 2024-07-16 ENCOUNTER — Other Ambulatory Visit: Payer: Self-pay | Admitting: Family Medicine

## 2024-07-16 ENCOUNTER — Telehealth: Payer: Self-pay

## 2024-07-16 DIAGNOSIS — E118 Type 2 diabetes mellitus with unspecified complications: Secondary | ICD-10-CM

## 2024-07-16 DIAGNOSIS — I10 Essential (primary) hypertension: Secondary | ICD-10-CM

## 2024-07-16 MED ORDER — INSULIN LISPRO (1 UNIT DIAL) 100 UNIT/ML (KWIKPEN): 3.0000 [IU] | PEN_INJECTOR | Freq: Three times a day (TID) | SUBCUTANEOUS | 11 refills | Status: DC

## 2024-07-16 NOTE — Telephone Encounter (Signed)
 Kwikpen has been sent to pharmacy.

## 2024-07-16 NOTE — Telephone Encounter (Signed)
 Copied from CRM #[id number]. Topic: Clinical - Prescription Issue >> Jul 16, 2024  4:01 PM Avram MATSU wrote: Reason for CRM: pharmacy stated pt should use a insulin  quick pen instead of using the insulin  lispro (HUMALOG ) 100 UNIT/ML injection [[id number]] vile.   [place] wanted to make the office aware when a medication gets sent in, the office number show the same as the fax.

## 2024-07-16 NOTE — Progress Notes (Signed)
 Orders have been released so that patient can have drawn at [facility] per her request.

## 2024-07-17 NOTE — Assessment & Plan Note (Signed)
 Please do not take any more Lantus  than 23 units a day because your fasting blood sugar was 111 this morning.  Adding Humalog  3 units at meals to bring her postprandial blood sugars under better control.  Would like to see her A1c below 8%

## 2024-07-18 ENCOUNTER — Other Ambulatory Visit: Payer: Self-pay | Admitting: Family Medicine

## 2024-07-18 DIAGNOSIS — Z794 Long term (current) use of insulin: Secondary | ICD-10-CM

## 2024-07-23 ENCOUNTER — Other Ambulatory Visit: Payer: Self-pay | Admitting: Urology

## 2024-07-23 DIAGNOSIS — Z794 Long term (current) use of insulin: Secondary | ICD-10-CM | POA: Diagnosis not present

## 2024-07-23 DIAGNOSIS — N3946 Mixed incontinence: Secondary | ICD-10-CM

## 2024-07-23 DIAGNOSIS — I10 Essential (primary) hypertension: Secondary | ICD-10-CM | POA: Diagnosis not present

## 2024-07-23 DIAGNOSIS — E118 Type 2 diabetes mellitus with unspecified complications: Secondary | ICD-10-CM | POA: Diagnosis not present

## 2024-07-24 ENCOUNTER — Ambulatory Visit: Payer: Self-pay | Admitting: Family Medicine

## 2024-07-24 DIAGNOSIS — M25512 Pain in left shoulder: Secondary | ICD-10-CM | POA: Diagnosis not present

## 2024-07-24 DIAGNOSIS — E118 Type 2 diabetes mellitus with unspecified complications: Secondary | ICD-10-CM

## 2024-07-24 DIAGNOSIS — M25511 Pain in right shoulder: Secondary | ICD-10-CM | POA: Diagnosis not present

## 2024-07-24 LAB — CBC WITH DIFFERENTIAL/PLATELET
Basophils Absolute: 0.1 x10E3/uL (ref 0.0–0.2)
Basos: 1 %
EOS (ABSOLUTE): 0.1 x10E3/uL (ref 0.0–0.4)
Eos: 1 %
Hematocrit: 38.1 % (ref 34.0–46.6)
Hemoglobin: 12.9 g/dL (ref 11.1–15.9)
Immature Grans (Abs): 0 x10E3/uL (ref 0.0–0.1)
Immature Granulocytes: 0 %
Lymphocytes Absolute: 1.5 x10E3/uL (ref 0.7–3.1)
Lymphs: 18 %
MCH: 31.6 pg (ref 26.6–33.0)
MCHC: 33.9 g/dL (ref 31.5–35.7)
MCV: 93 fL (ref 79–97)
Monocytes Absolute: 0.9 x10E3/uL (ref 0.1–0.9)
Monocytes: 10 %
Neutrophils Absolute: 5.7 x10E3/uL (ref 1.4–7.0)
Neutrophils: 70 %
Platelets: 330 x10E3/uL (ref 150–450)
RBC: 4.08 x10E6/uL (ref 3.77–5.28)
RDW: 12.8 % (ref 11.7–15.4)
WBC: 8.3 x10E3/uL (ref 3.4–10.8)

## 2024-07-24 LAB — COMPREHENSIVE METABOLIC PANEL WITH GFR
ALT: 17 IU/L (ref 0–32)
AST: 21 IU/L (ref 0–40)
Albumin: 3.9 g/dL (ref 3.7–4.7)
Alkaline Phosphatase: 51 IU/L (ref 44–121)
BUN/Creatinine Ratio: 24 (ref 12–28)
BUN: 20 mg/dL (ref 8–27)
Bilirubin Total: 0.5 mg/dL (ref 0.0–1.2)
CO2: 23 mmol/L (ref 20–29)
Calcium: 9.2 mg/dL (ref 8.7–10.3)
Chloride: 95 mmol/L — ABNORMAL LOW (ref 96–106)
Creatinine, Ser: 0.82 mg/dL (ref 0.57–1.00)
Globulin, Total: 2.2 g/dL (ref 1.5–4.5)
Glucose: 116 mg/dL — ABNORMAL HIGH (ref 70–99)
Potassium: 4.9 mmol/L (ref 3.5–5.2)
Sodium: 132 mmol/L — ABNORMAL LOW (ref 134–144)
Total Protein: 6.1 g/dL (ref 6.0–8.5)
eGFR: 70 mL/min/1.73 (ref >59)

## 2024-07-24 LAB — LIPID PANEL
Chol/HDL Ratio: 2 ratio (ref 0.0–4.4)
Cholesterol, Total: 115 mg/dL (ref 100–199)
HDL: 57 mg/dL (ref >39)
LDL Chol Calc (NIH): 44 mg/dL (ref 0–99)
Triglycerides: 67 mg/dL (ref 0–149)
VLDL Cholesterol Cal: 14 mg/dL (ref 5–40)

## 2024-07-24 LAB — TSH+FREE T4
Free T4: 1.54 ng/dL (ref 0.82–1.77)
TSH: 0.498 u[IU]/mL (ref 0.450–4.500)

## 2024-07-30 ENCOUNTER — Ambulatory Visit
Admission: RE | Admit: 2024-07-30 | Discharge: 2024-07-30 | Disposition: A | Discharge status: Home / Self Care | Source: Ambulatory Visit | Attending: Family Medicine | Admitting: Family Medicine

## 2024-07-30 ENCOUNTER — Other Ambulatory Visit: Payer: Self-pay | Admitting: Family Medicine

## 2024-07-30 DIAGNOSIS — Z1231 Encounter for screening mammogram for malignant neoplasm of breast: Secondary | ICD-10-CM | POA: Diagnosis not present

## 2024-07-31 ENCOUNTER — Other Ambulatory Visit: Payer: Self-pay | Admitting: Family Medicine

## 2024-07-31 DIAGNOSIS — M25512 Pain in left shoulder: Secondary | ICD-10-CM | POA: Diagnosis not present

## 2024-07-31 DIAGNOSIS — M25511 Pain in right shoulder: Secondary | ICD-10-CM | POA: Diagnosis not present

## 2024-07-31 MED ORDER — BD PEN NEEDLE NANO U/F 32G X 4 MM MISC: 1.0000 | Freq: Four times a day (QID) | 3 refills | Status: AC

## 2024-07-31 NOTE — Telephone Encounter (Signed)
 Copied from CRM #[id number]. Topic: Clinical - Medication Refill >> Jul 31, 2024  2:50 PM Pinkey ORN wrote: Medication: BD PEN NEEDLE NANO U/F 32G X 4 MM MISC  Has the patient contacted their pharmacy? Yes (Agent: If no, request that the patient contact the pharmacy for the refill. If patient does not wish to contact the pharmacy document the reason why and proceed with request.) (Agent: If yes, when and what did the pharmacy advise?)  This is the patient's preferred pharmacy:  [facility] - [place], [place] - [address] [place] [address] Phone: [phone] Fax: [phone]  Is this the correct pharmacy for this prescription? Yes If no, delete pharmacy and type the correct one.   Has the prescription been filled recently? No  Is the patient out of the medication? Yes  Has the patient been seen for an appointment in the last year OR does the patient have an upcoming appointment? Yes  Can we respond through MyChart? Yes  Agent: Please be advised that Rx refills may take up to 3 business days. We ask that you follow-up with your pharmacy. >> Jul 31, 2024  2:52 PM Pinkey ORN wrote: Patient prescription needs to be updated, patient is now using 4 x the needles in 1 day.

## 2024-08-01 ENCOUNTER — Other Ambulatory Visit: Payer: Self-pay

## 2024-08-01 MED ORDER — BD PEN NEEDLE NANO U/F 32G X 4 MM MISC: 1.0000 | Freq: Four times a day (QID) | 3 refills | Status: DC

## 2024-08-01 NOTE — Telephone Encounter (Signed)
 Received fax from [facility] requesting pen needles. I confirmed with pharmacy staff that electronic prescription had been received and processed.

## 2024-08-06 ENCOUNTER — Other Ambulatory Visit: Payer: Self-pay | Admitting: Family Medicine

## 2024-08-10 DIAGNOSIS — M25511 Pain in right shoulder: Secondary | ICD-10-CM | POA: Diagnosis not present

## 2024-08-10 DIAGNOSIS — M25512 Pain in left shoulder: Secondary | ICD-10-CM | POA: Diagnosis not present

## 2024-08-14 DIAGNOSIS — Z1331 Encounter for screening for depression: Secondary | ICD-10-CM | POA: Diagnosis not present

## 2024-08-14 DIAGNOSIS — I10 Essential (primary) hypertension: Secondary | ICD-10-CM | POA: Diagnosis not present

## 2024-08-14 DIAGNOSIS — Z0001 Encounter for general adult medical examination with abnormal findings: Secondary | ICD-10-CM | POA: Diagnosis not present

## 2024-08-14 DIAGNOSIS — E785 Hyperlipidemia, unspecified: Secondary | ICD-10-CM | POA: Diagnosis not present

## 2024-08-14 DIAGNOSIS — E039 Hypothyroidism, unspecified: Secondary | ICD-10-CM | POA: Diagnosis not present

## 2024-08-14 DIAGNOSIS — E119 Type 2 diabetes mellitus without complications: Secondary | ICD-10-CM | POA: Diagnosis not present

## 2024-08-14 DIAGNOSIS — Z794 Long term (current) use of insulin: Secondary | ICD-10-CM | POA: Diagnosis not present

## 2024-08-14 DIAGNOSIS — Z0000 Encounter for general adult medical examination without abnormal findings: Secondary | ICD-10-CM | POA: Diagnosis not present

## 2024-08-15 DIAGNOSIS — M25512 Pain in left shoulder: Secondary | ICD-10-CM | POA: Diagnosis not present

## 2024-08-15 DIAGNOSIS — M25511 Pain in right shoulder: Secondary | ICD-10-CM | POA: Diagnosis not present

## 2024-08-20 ENCOUNTER — Ambulatory Visit: Admitting: Urology

## 2024-08-20 ENCOUNTER — Other Ambulatory Visit
Admission: RE | Admit: 2024-08-20 | Discharge: 2024-08-20 | Disposition: A | Discharge status: Home / Self Care | Attending: Urology | Admitting: Urology

## 2024-08-20 ENCOUNTER — Encounter: Payer: Self-pay | Admitting: Urology

## 2024-08-20 VITALS — BP 132/80 | HR 93 | Wt 175.0 lb

## 2024-08-20 DIAGNOSIS — N8189 Other female genital prolapse: Secondary | ICD-10-CM

## 2024-08-20 DIAGNOSIS — N952 Postmenopausal atrophic vaginitis: Secondary | ICD-10-CM | POA: Diagnosis not present

## 2024-08-20 DIAGNOSIS — N390 Urinary tract infection, site not specified: Secondary | ICD-10-CM | POA: Diagnosis not present

## 2024-08-20 DIAGNOSIS — N3946 Mixed incontinence: Secondary | ICD-10-CM

## 2024-08-20 LAB — URINALYSIS, COMPLETE (UACMP) WITH MICROSCOPIC
Bilirubin Urine: NEGATIVE
Glucose, UA: NEGATIVE mg/dL
Ketones, ur: NEGATIVE mg/dL
Nitrite: POSITIVE — AB
Protein, ur: 30 mg/dL — AB
Specific Gravity, Urine: 1.015 (ref 1.005–1.030)
WBC, UA: >50 WBC/hpf (ref 0–5)
pH: 6.5 (ref 5.0–8.0)

## 2024-08-20 MED ORDER — AMOXICILLIN-POT CLAVULANATE 875-125 MG PO TABS: 1.0000 | ORAL_TABLET | Freq: Two times a day (BID) | ORAL | 0 refills | Status: AC

## 2024-08-20 MED ORDER — FLUCONAZOLE 150 MG PO TABS: 150.0000 mg | ORAL_TABLET | Freq: Once | ORAL | 0 refills | Status: AC

## 2024-08-20 NOTE — Progress Notes (Signed)
 08/20/2024 12:02 PM   [name] 1941/07/24 [id number]  Referring provider: Ziglar, Susan K, MD [address] [place],  [place] [address]  Urological history: 1. rUTI's -Contributing factors of age, vaginal atrophy, diabetes, incontinence -Documented urine cultures over the last year  January 06, 2024-Klebsiella pneumoniae  November 01, 2023-Klebsiella pneumoniae  August 30, 2023 no growth  2. Mixed incontinence -Contributing factors of age, hysterectomy, vaginal births, diabetes, arthritis, hypertension, vaginal atrophy, pelvic prolapse and lymphedema -failed pessary  -Myrbetriq  50 mg daily  3. Nocturia -Risk factors for nocturia: hypertension, diabetes and arthritis  4. Renal cyst -contrast CT (2019) - lower pole of the right kidney there is a 2.1 cm simple cyst.   Chief Complaint  Patient presents with   Recurrent UTI   HPI: [name] is a 83 y.o. woman who presents today for an acute visit for burning and pain with urination with her daughter, [name].  Previous records reviewed.    Serum creatinine (07/2024) 0.82, eGFR 70  Hemoglobin A1c (07/2024) 8.3  She has been having 4 days of dysuria and a severe increase in her nocturia.  She has been taking AZO for the last 4 days to help with the pain.  She does not feel that she has a lot of energy.  Patient denies any modifying or aggravating factors.  Patient denies any gross hematuria, or suprapubic/flank pain.  Patient denies any fevers, chills, nausea or vomiting.    She also has questions regarding whether taking Lasix  and Myrbetriq  together is advisable.  She is having nocturia at baseline.     CATH UA orange cloudy, greater than 50 WBCs, 11-20 RBCs, many bacteria and WBC clumps present.  PMH: Past Medical History:  Diagnosis Date   Arthritis    Basal cell carcinoma 10/27/2010   Right lower medial cheek. Excised: 11/24/2010, margins free.   Breast cancer (HCC) 2000   Cancer (HCC)     Diabetes mellitus    GERD (gastroesophageal reflux disease)    Hypertension    Lymph edema    Rt arm   Neuropathy    Osteoporosis    Personal history of chemotherapy 2000   right breast   Personal history of radiation therapy 2000   right breast   Thyroid  disease     Surgical History: Past Surgical History:  Procedure Laterality Date   ANTERIOR AND POSTERIOR REPAIR N/A 10/18/2016   Procedure: ANTERIOR (CYSTOCELE) AND POSTERIOR REPAIR (RECTOCELE);  Surgeon: Archie Savers, MD;  Location: [facility] ORS;  Service: Gynecology;  Laterality: N/A;   APPENDECTOMY     BACK SURGERY  1983   discectomy   BREAST EXCISIONAL BIOPSY Left 12/11/2010   benign.  BENIGN BREAST TISSUE WITH STROMAL FIBROSIS, SCLEROSING ADENOSIS    cartoid u/s  02/24/06   60-79% LICA stenosis   COLONOSCOPY     DOPPLER ECHOCARDIOGRAPHY  02/24/06   Triv MR, EF 55-60%   FOOT SURGERY  1992 & 1994   bilateral   HEMORROIDECTOMY     MASTECTOMY Right 2000   right, modified radical+ chemo, 2/23 nodes 4/03   miscarriage  1970   mumps  1965   h/o   OTHER SURGICAL HISTORY  04/03/02   dexa, wnl   OTHER SURGICAL HISTORY  03/12/04   dexa, wnl   OTHER SURGICAL HISTORY  08/12/05   dexa-osteopor Hip, osteopen spine   OTHER SURGICAL HISTORY  10/25/07   dexa- nml hip and spine   SEPTOPLASTY  1970   TONSILLECTOMY  1945   VAGINAL DELIVERY     x5   VAGINAL HYSTERECTOMY  1974   partial, fibroids   VENTRAL HERNIA REPAIR      Home Medications:  Allergies as of 08/20/2024       Reactions   Atorvastatin Swelling   Leg swelling Other reaction(s): Unknown Leg swelling Leg swelling   Azithromycin  Diarrhea, Nausea And Vomiting   Other reaction(s): Nausea And Vomiting   Ciprofloxacin Other (See Comments)   Does not remember Other reaction(s): Unknown tendonitis tendonitis   Doxycycline  Nausea And Vomiting   Hydrocodone Bit-homatrop Mbr    REACTION:    Hydrocodone Bit-homatrop Mbr    Other reaction(s): Other (See  Comments) Drowsiness Other reaction(s): Other (See Comments) REACTION:   Rosiglitazone Swelling   Other reaction(s): Unknown REACTION: SWELLING   Rosiglitazone Maleate    REACTION: SWELLING        Medication List        Accurate as of August 20, 2024 12:02 PM. If you have any questions, ask your nurse or doctor.          Accu-Chek Aviva Plus test strip Generic drug: glucose blood Use as instructed   Accu-Chek Aviva Plus w/Device Kit Use to check blood sugar twice daily   accu-chek multiclix lancets Use as instructed to check blood sugar twice daily E11.9   acetaminophen  650 MG CR tablet Commonly known as: Tylenol  8 Hour Take 1 tablet (650 mg total) by mouth every 8 (eight) hours as needed for pain.   ALIGN PO Take by mouth.   amoxicillin -clavulanate 875-125 MG tablet Commonly known as: AUGMENTIN  Take 1 tablet by mouth every 12 (twelve) hours. Started by: CLOTILDA CORNWALL   aspirin  81 MG tablet Take 81 mg by mouth at bedtime.   BD Pen Needle Nano U/F 32G X 4 MM Misc Generic drug: Insulin  Pen Needle Inject 1 each into the skin in the morning, at noon, in the evening, and at bedtime.   BD Pen Needle Nano U/F 32G X 4 MM Misc Generic drug: Insulin  Pen Needle Inject 1 each into the skin in the morning, at noon, in the evening, and at bedtime.   BEET ROOT PO Take by mouth.   carvedilol  6.25 MG tablet Commonly known as: COREG  Take 1 tablet (6.25 mg total) by mouth 2 (two) times daily with a meal.   CENTRUM SILVER PO Take 1 tablet by mouth daily.   chlorhexidine  4 % external liquid Commonly known as: Hibiclens  Apply topically daily as needed.   clobetasol  cream 0.05 % Commonly known as: TEMOVATE  APPLY ONE APPLICATION ON THE SKIN TWICE A DAY AS NEEDED. AVOID FACE, GROIN, AND UNDERARMS.   co-enzyme Q-10 30 MG capsule Take 7 capsules (210 mg total) by mouth daily.   cyanocobalamin  1000 MCG tablet Commonly known as: VITAMIN B12 Take 1,000 mcg by  mouth daily.   D3 5000 PO Take by mouth.   ezetimibe  10 MG tablet Commonly known as: ZETIA  Take 1 tablet (10 mg total) by mouth daily.   fluconazole  150 MG tablet Commonly known as: DIFLUCAN  Take 1 tablet (150 mg total) by mouth once for 1 dose. Started by: CLOTILDA CORNWALL   furosemide  20 MG tablet Commonly known as: LASIX  Take 1 tablet (20 mg total) by mouth daily.   insulin  glargine 100 UNIT/ML Solostar Pen Commonly known as: Lantus  SoloStar INJECT 26 UNITS INTO THE SKIN AT BEDTIME. What changed:  how much to take when to take this additional instructions   insulin  lispro  100 UNIT/ML injection Commonly known as: HumaLOG  Take 3 units with meals   insulin  lispro 100 UNIT/ML KwikPen Commonly known as: HUMALOG  INJECT 3 UNITS INTO THE SKIN 3 TIMES DAILY   levothyroxine  75 MCG tablet Commonly known as: SYNTHROID  Take 1 tablet (75 mcg total) by mouth daily before breakfast.   Mag Aspart-Potassium Aspart 250-250 MG Caps Take by mouth.   Magnesium  250 MG Caps Take by mouth.   Myrbetriq  50 MG Tb24 tablet Generic drug: mirabegron  ER TAKE ONE TABLET BY MOUTH EVERY DAY   OVER THE COUNTER MEDICATION Take 1 tablet by mouth daily. Immune System Support Supplement includes :  Echinacea Zinc Vitamin C   potassium chloride 10 MEQ tablet Commonly known as: KLOR-CON TAKE 1 TABLET BY MOUTH ONCE DAILY WITH FOOD   QC TUMERIC COMPLEX PO Take by mouth.   rosuvastatin  10 MG tablet Commonly known as: Crestor  Take 1 tablet (10 mg total) by mouth daily.   sertraline  25 MG tablet Commonly known as: ZOLOFT  Take 1 tablet (25 mg total) by mouth daily.   SUPER CRANBERRY/VITAMIN D3 PO Take by mouth.   valsartan  160 MG tablet Commonly known as: DIOVAN  Take 1 tablet (160 mg total) by mouth daily.        Allergies:  Allergies  Allergen Reactions   Atorvastatin Swelling    Leg swelling Other reaction(s): Unknown Leg swelling Leg swelling   Azithromycin  Diarrhea and  Nausea And Vomiting    Other reaction(s): Nausea And Vomiting   Ciprofloxacin Other (See Comments)    Does not remember Other reaction(s): Unknown tendonitis tendonitis   Doxycycline  Nausea And Vomiting   Hydrocodone Bit-Homatrop Mbr     REACTION:    Hydrocodone Bit-Homatrop Mbr     Other reaction(s): Other (See Comments) Drowsiness Other reaction(s): Other (See Comments) REACTION:   Rosiglitazone Swelling    Other reaction(s): Unknown REACTION: SWELLING   Rosiglitazone Maleate     REACTION: SWELLING    Family History: Family History  Problem Relation Age of Onset   Diabetes Mother    Hypertension Mother    Heart attack Mother    Heart failure Father    Hypertension Father    Stroke Father    Lung cancer Brother        hx of smoking   Stroke Brother    Heart attack Brother        PTCA MI x 2   Breast cancer Paternal Aunt        paternal great aunt    Social History:  reports that she has never smoked. She has never been exposed to tobacco smoke. She has never used smokeless tobacco. She reports that she does not drink alcohol and does not use drugs.  ROS: Pertinent ROS in HPI  Physical Exam: BP 132/80 (BP Location: Left Arm, Patient Position: Sitting, Cuff Size: Normal)   Pulse 93   Wt 175 lb (79.4 kg)   SpO2 97%   BMI 29.12 kg/m   Constitutional:  Well nourished. Alert and oriented, No acute distress. HEENT:  AT, moist mucus membranes.  Trachea midline Cardiovascular: No clubbing, cyanosis, or edema. Respiratory: Normal respiratory effort, no increased work of breathing. GU: No CVA tenderness.  No bladder fullness or masses.  Recession of labia minora, dry, pale vulvar vaginal mucosa and loss of mucosal ridges and folds.  Normal urethral meatus, no lesions, no prolapse, no discharge.   No urethral masses, tenderness and/or tenderness. No bladder fullness, tenderness or masses. Pale vagina mucosa, poor  estrogen effect, no discharge, no lesions, poor pelvic  support, Grade III cystocele and no rectocele noted.  Anus and perineum are without rashes or lesions.    Neurologic: Grossly intact, no focal deficits, moving all 4 extremities. Psychiatric: Normal mood and affect.    Laboratory Data: Urinalysis See Epic and HPI I have reviewed the labs.   Pertinent Imaging: N/A   In and Out Catheterization Patient is present today for a I & O catheterization due to micro heme. Patient was cleaned and prepped in a sterile fashion with betadine . A 14 FR cath was inserted no complications were noted , 50 ml of urine return was noted, urine was pink in color. A clean urine sample was collected for UA and possible culture. Bladder was drained  And catheter was removed with out difficulty.    Performed by: CLOTILDA CORNWALL, PA-C and Andrea DELENA Kirks, LPN   1. rUTI's  - UA grossly infected  - Urine culture pending - Started empirically on -Augmentin  875/125 twice daily for seven days, will adjust if necessary once urine culture and sensitivity results are available  - Advised patient to increase fluid intake and monitor symptoms - Counseled on UTI prevention (hydration, post-coital voiding, wiping from to back)  - follow-up or call if no improvement within 48-72 hours or if symptoms worsen (fever, back pain) - Diflucan  50 mg 1 tablet given as well  2. GSM -continue vaginal lubricants with hyaluronic acid for symptom relief -advised to use the vaginal lubricants daily and liberally  3. Pelvic floor laxity -She does not empty her bladder completely and she has severe prolapse  -Manage conservatively  4. Microscopic hematuria -UA w/ micro heme, likely secondary to infection -follow up pending urine culture results, if urine culture is positive, would like to check her urine specimen in 2 months to ensure the microscopic hematuria resolves with the treatment of the infection, if the urine culture is negative, we will need to discuss pursuing a hematuria  workup with CT urogram and cystoscopy  5.  Incontinence - Explained the mechanism of accident for both the Lasix  and the Myrbetriq  and I advised her to take Myrbetriq  for 30 days and record how much nocturia she has and then stop Myrbetriq  for 30 days and see if the nocturia gets worse, then she will know if the Myrbetriq  is making a difference for her  Return for Follow up pending labs.  These notes generated with voice recognition software. I apologize for typographical errors.  CLOTILDA CORNWALL RIGGERS  [facility] [address]  [address] [place], [place] [address] [phone]

## 2024-08-22 ENCOUNTER — Ambulatory Visit: Payer: Self-pay | Admitting: Urology

## 2024-08-22 DIAGNOSIS — N390 Urinary tract infection, site not specified: Secondary | ICD-10-CM

## 2024-08-22 LAB — URINE CULTURE: Culture: >=100000 — AB

## 2024-08-22 MED ORDER — FOSFOMYCIN TROMETHAMINE 3 G PO PACK: 3.0000 g | PACK | Freq: Once | ORAL | 0 refills | Status: AC

## 2024-08-24 ENCOUNTER — Telehealth: Payer: Self-pay

## 2024-08-24 NOTE — Telephone Encounter (Signed)
 Pt with c/o diarrhea. Pt is augemnetin and I explained this is a very common side effect but its normal. I gave her suggestions on to help with diarrhea with diet and she stated she had taken imodium already and it was helping slightly. Pt will continue her antibiotic and I reminded her to try and keep herself clean to stop reinfection.

## 2024-09-04 DIAGNOSIS — R531 Weakness: Secondary | ICD-10-CM | POA: Diagnosis not present

## 2024-09-04 DIAGNOSIS — R197 Diarrhea, unspecified: Secondary | ICD-10-CM | POA: Diagnosis not present

## 2024-09-19 DIAGNOSIS — J40 Bronchitis, not specified as acute or chronic: Secondary | ICD-10-CM | POA: Diagnosis not present

## 2024-10-03 DIAGNOSIS — M19011 Primary osteoarthritis, right shoulder: Secondary | ICD-10-CM | POA: Diagnosis not present

## 2024-10-03 DIAGNOSIS — M19012 Primary osteoarthritis, left shoulder: Secondary | ICD-10-CM | POA: Diagnosis not present

## 2024-10-15 ENCOUNTER — Encounter: Payer: Self-pay | Admitting: Radiology

## 2024-10-15 ENCOUNTER — Ambulatory Visit: Admitting: Family Medicine

## 2024-10-16 ENCOUNTER — Other Ambulatory Visit: Payer: Self-pay | Admitting: Family Medicine

## 2024-10-16 DIAGNOSIS — I10 Essential (primary) hypertension: Secondary | ICD-10-CM

## 2024-11-06 ENCOUNTER — Other Ambulatory Visit: Payer: Self-pay | Admitting: Cardiology

## 2024-11-12 DIAGNOSIS — J40 Bronchitis, not specified as acute or chronic: Secondary | ICD-10-CM | POA: Diagnosis not present

## 2025-01-12 ENCOUNTER — Telehealth: Payer: Self-pay | Admitting: Urology

## 2025-01-12 NOTE — Telephone Encounter (Signed)
 She had an UTI in the fall and it had microscopic hematuria, so we need to check another UA to make sure that she no longer has microscopic hematuria.

## 2025-01-16 NOTE — Telephone Encounter (Signed)
 I spoke with patient's daughter and she said she would have to be catheterized and was not going to be happy.  She also said she would want to wait until the weather gets warmer.

## 2025-03-12 ENCOUNTER — Encounter (INDEPENDENT_AMBULATORY_CARE_PROVIDER_SITE_OTHER)

## 2025-03-12 ENCOUNTER — Ambulatory Visit (INDEPENDENT_AMBULATORY_CARE_PROVIDER_SITE_OTHER): Admitting: Vascular Surgery
